# Patient Record
Sex: Male | Born: 1950 | Race: White | Hispanic: No | Marital: Married | Smoking: Former smoker
Health system: Southern US, Community
[De-identification: ages and names within clinical notes are randomized; demographics above are authoritative.]

## PROBLEM LIST (undated history)

## (undated) DIAGNOSIS — E785 Hyperlipidemia, unspecified: Secondary | ICD-10-CM

## (undated) DIAGNOSIS — I2510 Atherosclerotic heart disease of native coronary artery without angina pectoris: Secondary | ICD-10-CM

## (undated) DIAGNOSIS — L80 Vitiligo: Secondary | ICD-10-CM

## (undated) DIAGNOSIS — K219 Gastro-esophageal reflux disease without esophagitis: Secondary | ICD-10-CM

## (undated) DIAGNOSIS — C801 Malignant (primary) neoplasm, unspecified: Secondary | ICD-10-CM

## (undated) DIAGNOSIS — G4733 Obstructive sleep apnea (adult) (pediatric): Secondary | ICD-10-CM

## (undated) DIAGNOSIS — R9439 Abnormal result of other cardiovascular function study: Secondary | ICD-10-CM

## (undated) DIAGNOSIS — N400 Enlarged prostate without lower urinary tract symptoms: Secondary | ICD-10-CM

## (undated) DIAGNOSIS — R079 Chest pain, unspecified: Secondary | ICD-10-CM

## (undated) DIAGNOSIS — R972 Elevated prostate specific antigen [PSA]: Secondary | ICD-10-CM

## (undated) DIAGNOSIS — R339 Retention of urine, unspecified: Secondary | ICD-10-CM

## (undated) DIAGNOSIS — I10 Essential (primary) hypertension: Secondary | ICD-10-CM

## (undated) DIAGNOSIS — K222 Esophageal obstruction: Secondary | ICD-10-CM

## (undated) DIAGNOSIS — E039 Hypothyroidism, unspecified: Secondary | ICD-10-CM

## (undated) DIAGNOSIS — K625 Hemorrhage of anus and rectum: Secondary | ICD-10-CM

## (undated) DIAGNOSIS — Z86010 Personal history of colonic polyps: Secondary | ICD-10-CM

## (undated) DIAGNOSIS — K922 Gastrointestinal hemorrhage, unspecified: Secondary | ICD-10-CM

## (undated) DIAGNOSIS — K209 Esophagitis, unspecified: Secondary | ICD-10-CM

## (undated) DIAGNOSIS — I739 Peripheral vascular disease, unspecified: Secondary | ICD-10-CM

## (undated) HISTORY — DX: Vitiligo: L80

## (undated) HISTORY — DX: Esophageal obstruction: K22.2

## (undated) HISTORY — DX: Hyperlipidemia, unspecified: E78.5

## (undated) HISTORY — DX: Hypothyroidism, unspecified: E03.9

## (undated) HISTORY — DX: Esophagitis, unspecified: K20.9

## (undated) HISTORY — DX: Gastro-esophageal reflux disease without esophagitis: K21.9

## (undated) HISTORY — DX: Atherosclerotic heart disease of native coronary artery without angina pectoris: I25.10

## (undated) HISTORY — PX: OTHER SURGICAL HISTORY: SHX169

## (undated) HISTORY — PX: APPENDECTOMY: SHX54

## (undated) HISTORY — PX: CARDIAC CATHETERIZATION: SHX172

## (undated) HISTORY — DX: Gastrointestinal hemorrhage, unspecified: K92.2

## (undated) HISTORY — DX: Hemorrhage of anus and rectum: K62.5

## (undated) HISTORY — DX: Obstructive sleep apnea (adult) (pediatric): G47.33

## (undated) HISTORY — PX: COLONOSCOPY: SHX174

## (undated) HISTORY — DX: Essential (primary) hypertension: I10

## (undated) HISTORY — PX: PROSTATE BIOPSY: SHX241

## (undated) HISTORY — DX: Personal history of colonic polyps: Z86.010

## (undated) HISTORY — DX: Peripheral vascular disease, unspecified: I73.9

---
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
## 2001-02-25 ENCOUNTER — Other Ambulatory Visit: Admission: RE | Admit: 2001-02-25 | Discharge: 2001-02-25 | Payer: Self-pay | Admitting: General Surgery

## 2001-06-24 ENCOUNTER — Other Ambulatory Visit: Admission: RE | Admit: 2001-06-24 | Discharge: 2001-06-24 | Payer: Self-pay | Admitting: General Surgery

## 2002-05-19 ENCOUNTER — Encounter: Payer: Self-pay | Admitting: Family Medicine

## 2002-05-19 ENCOUNTER — Ambulatory Visit (HOSPITAL_COMMUNITY): Admission: RE | Admit: 2002-05-19 | Discharge: 2002-05-19 | Payer: Self-pay | Admitting: Family Medicine

## 2003-11-28 ENCOUNTER — Other Ambulatory Visit: Admission: RE | Admit: 2003-11-28 | Discharge: 2003-11-28 | Payer: Self-pay | Admitting: Dermatology

## 2004-11-17 HISTORY — PX: CORONARY ARTERY BYPASS GRAFT: SHX141

## 2005-06-24 ENCOUNTER — Ambulatory Visit: Payer: Self-pay | Admitting: *Deleted

## 2005-06-24 ENCOUNTER — Inpatient Hospital Stay (HOSPITAL_COMMUNITY): Admission: EM | Admit: 2005-06-24 | Discharge: 2005-06-27 | Payer: Self-pay | Admitting: Emergency Medicine

## 2005-06-25 ENCOUNTER — Ambulatory Visit: Payer: Self-pay | Admitting: Cardiology

## 2005-06-29 ENCOUNTER — Ambulatory Visit: Payer: Self-pay | Admitting: Hematology & Oncology

## 2005-07-08 ENCOUNTER — Inpatient Hospital Stay (HOSPITAL_COMMUNITY): Admission: RE | Admit: 2005-07-08 | Discharge: 2005-07-10 | Payer: Self-pay | Admitting: Cardiothoracic Surgery

## 2005-07-24 ENCOUNTER — Ambulatory Visit (HOSPITAL_COMMUNITY): Admission: RE | Admit: 2005-07-24 | Discharge: 2005-07-24 | Payer: Self-pay | Admitting: *Deleted

## 2005-07-24 ENCOUNTER — Ambulatory Visit: Payer: Self-pay | Admitting: *Deleted

## 2005-09-23 ENCOUNTER — Ambulatory Visit: Payer: Self-pay | Admitting: *Deleted

## 2005-12-03 ENCOUNTER — Ambulatory Visit: Payer: Self-pay | Admitting: *Deleted

## 2005-12-03 IMAGING — CR DG CHEST 2V
2 series · 2 of 2 positions shown · non-contrast
Comparison: 07/10/05.

CLINICAL DATA: Post-op from coronary artery bypass grafting. 
 CHEST - 2 VIEW:

[view not recorded (1 of 2)]
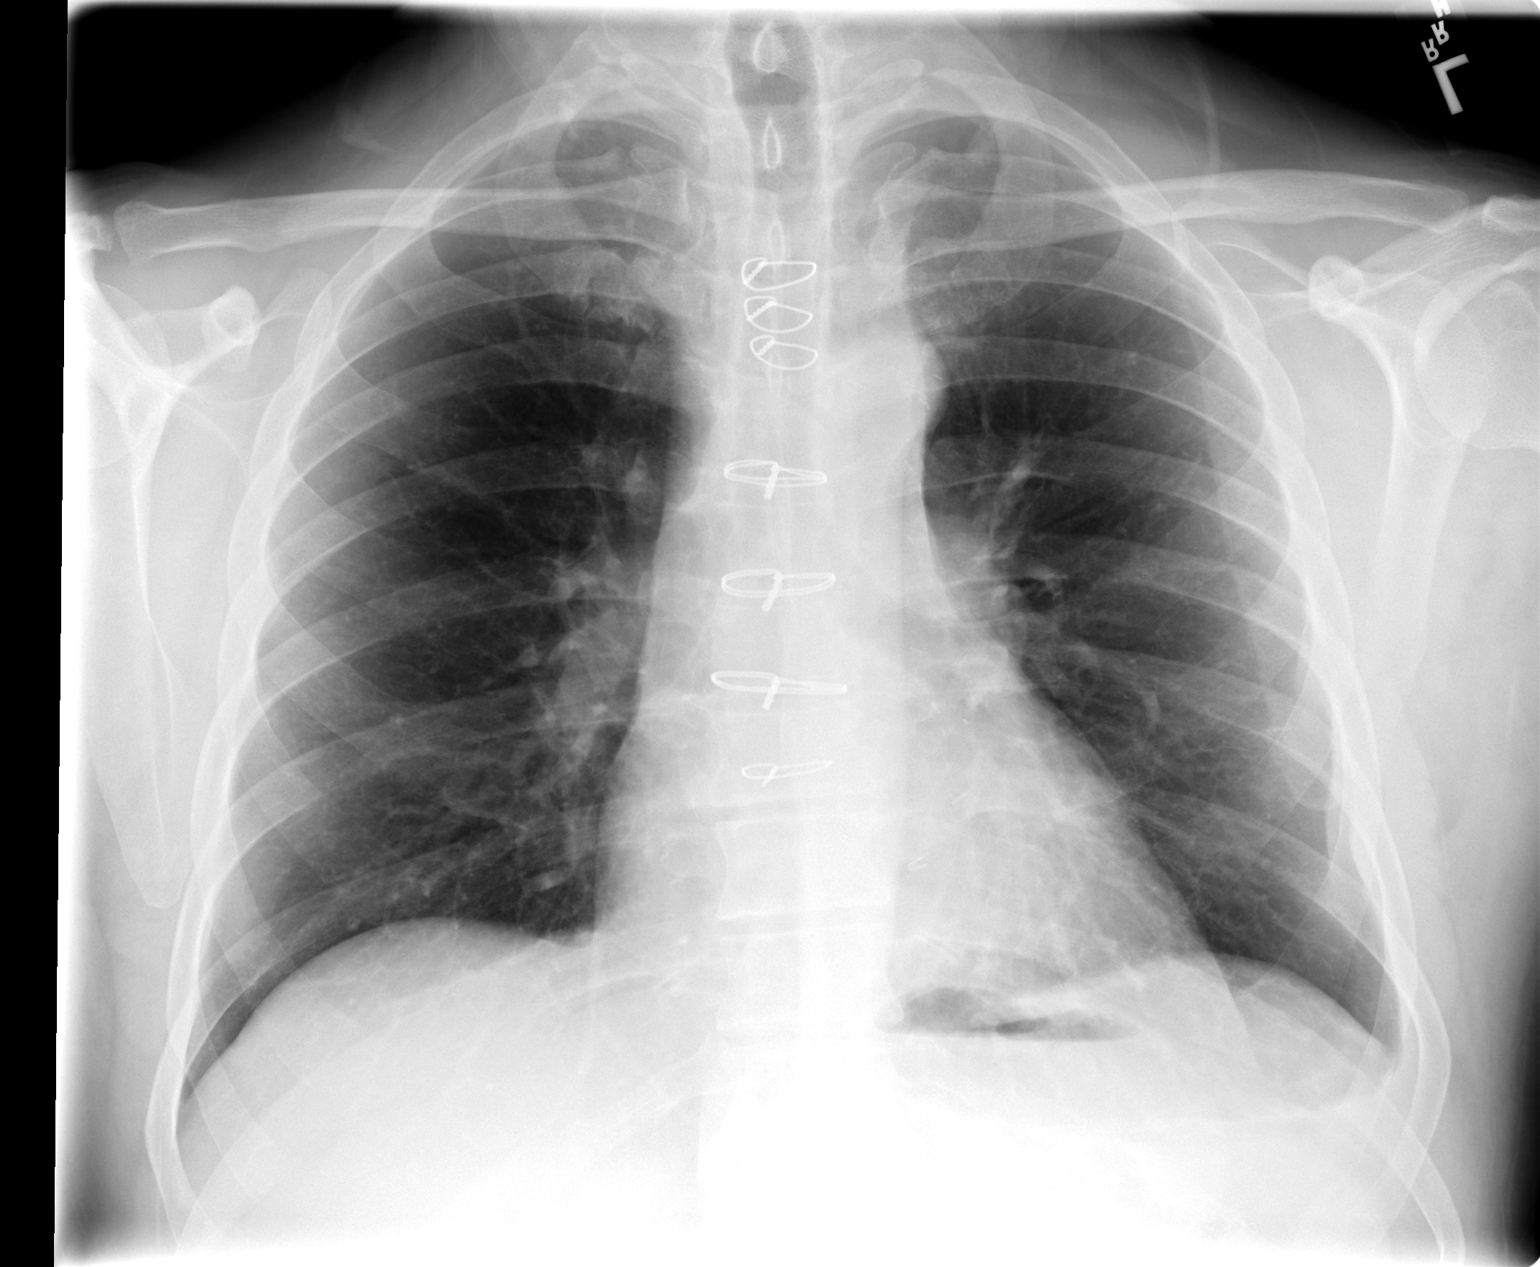

[view not recorded (2 of 2)]
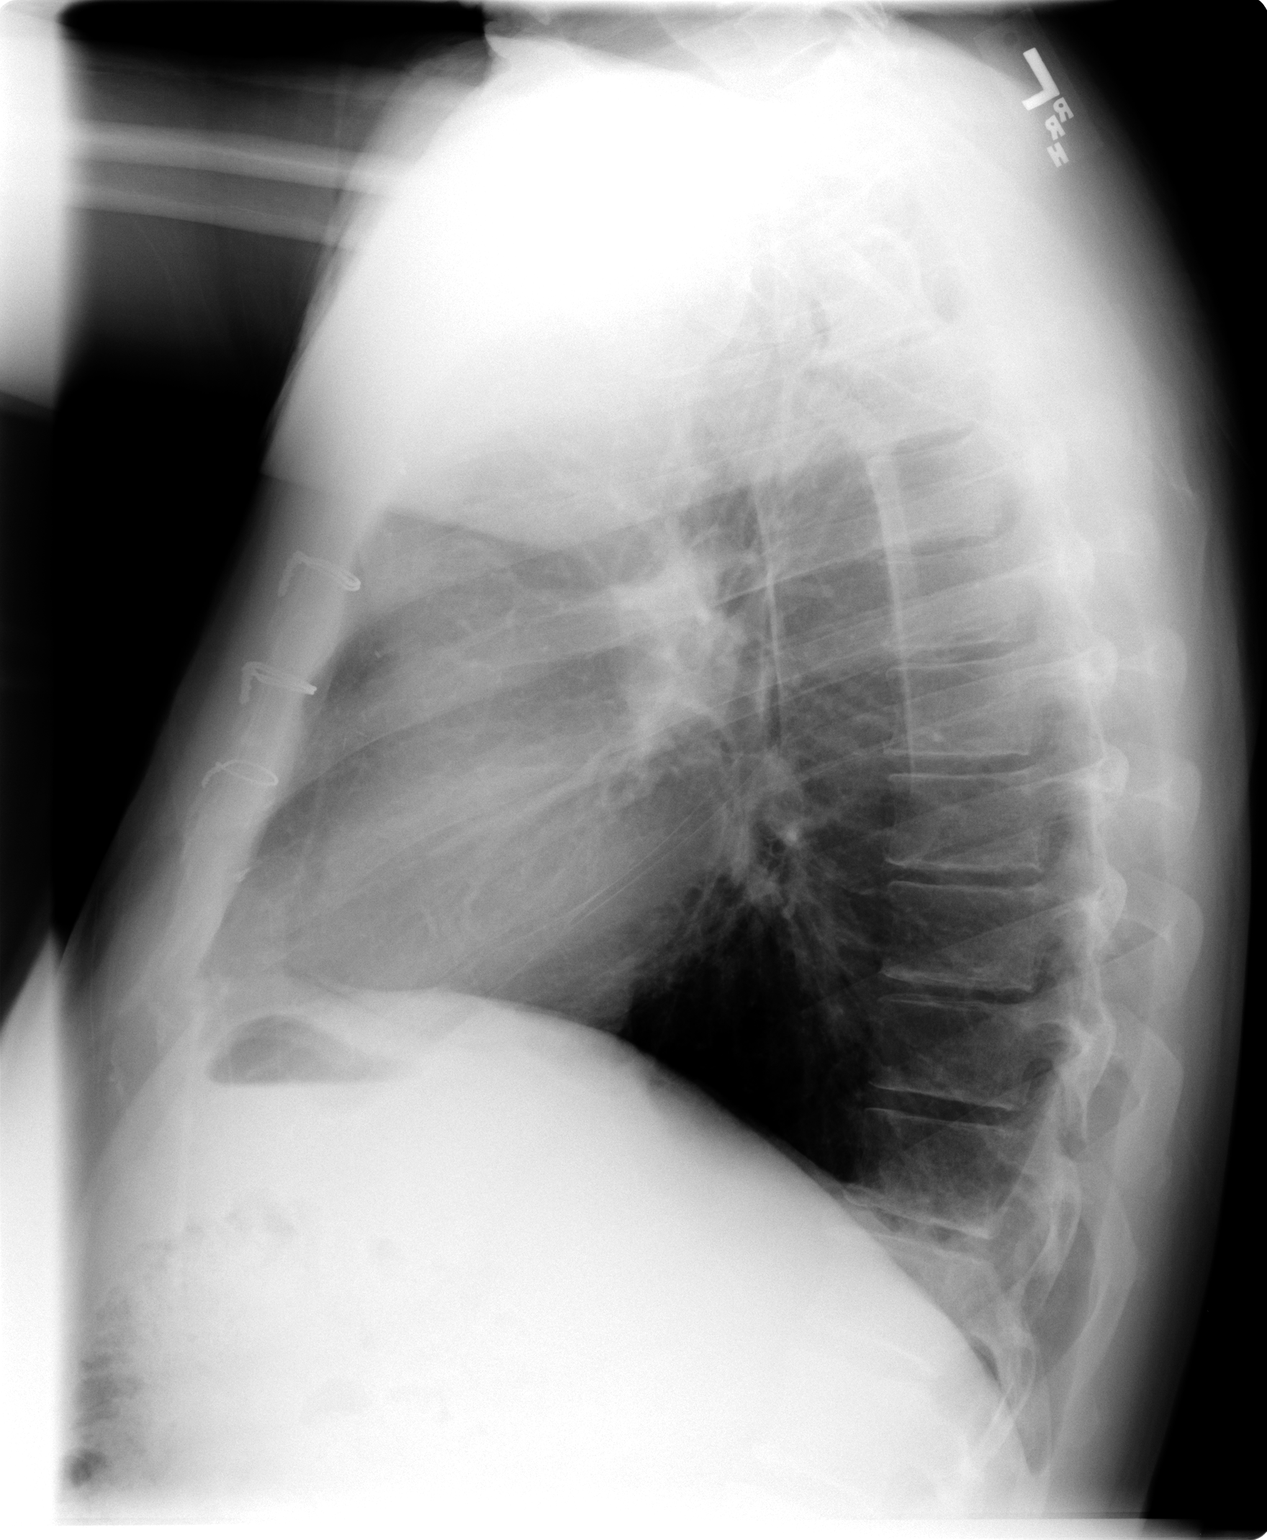

[2 of 2 positions shown; findings below may reference images not displayed]

Pleural effusions and bibasilar atelectasis have nearly completely resolved since the prior study. Minimal residual left pleural effusion vs pleural thickening is noted. Both lungs are clear.  There is no evidence of pneumothorax.   No mass or adenopathy is identified.   Heart size is within normal limits.   Median sternotomy wires are seen.
IMPRESSION: Minimal residual left pleural thickening vs fluid.  Otherwise negative.

## 2005-12-08 ENCOUNTER — Ambulatory Visit: Payer: Self-pay

## 2005-12-17 ENCOUNTER — Ambulatory Visit: Payer: Self-pay | Admitting: Cardiology

## 2005-12-17 ENCOUNTER — Ambulatory Visit: Payer: Self-pay | Admitting: *Deleted

## 2005-12-19 ENCOUNTER — Ambulatory Visit: Payer: Self-pay | Admitting: Cardiology

## 2005-12-19 ENCOUNTER — Inpatient Hospital Stay (HOSPITAL_BASED_OUTPATIENT_CLINIC_OR_DEPARTMENT_OTHER): Admission: RE | Admit: 2005-12-19 | Discharge: 2005-12-19 | Payer: Self-pay | Admitting: Cardiology

## 2005-12-30 ENCOUNTER — Ambulatory Visit: Payer: Self-pay | Admitting: *Deleted

## 2006-02-18 ENCOUNTER — Ambulatory Visit: Payer: Self-pay | Admitting: Internal Medicine

## 2006-02-19 ENCOUNTER — Encounter (INDEPENDENT_AMBULATORY_CARE_PROVIDER_SITE_OTHER): Payer: Self-pay | Admitting: *Deleted

## 2006-02-19 ENCOUNTER — Ambulatory Visit: Payer: Self-pay | Admitting: Internal Medicine

## 2006-06-18 ENCOUNTER — Ambulatory Visit: Payer: Self-pay | Admitting: *Deleted

## 2006-12-28 ENCOUNTER — Ambulatory Visit: Payer: Self-pay | Admitting: *Deleted

## 2007-08-12 ENCOUNTER — Ambulatory Visit: Payer: Self-pay | Admitting: Cardiology

## 2008-03-21 ENCOUNTER — Encounter: Payer: Self-pay | Admitting: Internal Medicine

## 2008-04-12 ENCOUNTER — Ambulatory Visit: Payer: Self-pay | Admitting: Cardiology

## 2008-04-17 ENCOUNTER — Ambulatory Visit: Payer: Self-pay

## 2008-12-18 ENCOUNTER — Ambulatory Visit: Payer: Self-pay | Admitting: Internal Medicine

## 2008-12-18 DIAGNOSIS — E785 Hyperlipidemia, unspecified: Secondary | ICD-10-CM

## 2008-12-18 DIAGNOSIS — E039 Hypothyroidism, unspecified: Secondary | ICD-10-CM | POA: Insufficient documentation

## 2008-12-18 DIAGNOSIS — K209 Esophagitis, unspecified without bleeding: Secondary | ICD-10-CM

## 2008-12-18 DIAGNOSIS — K625 Hemorrhage of anus and rectum: Secondary | ICD-10-CM

## 2008-12-18 DIAGNOSIS — K222 Esophageal obstruction: Secondary | ICD-10-CM

## 2008-12-18 HISTORY — DX: Esophagitis, unspecified without bleeding: K20.90

## 2008-12-18 HISTORY — DX: Esophageal obstruction: K22.2

## 2008-12-18 HISTORY — DX: Hemorrhage of anus and rectum: K62.5

## 2009-01-17 ENCOUNTER — Telehealth: Payer: Self-pay | Admitting: Internal Medicine

## 2009-01-17 ENCOUNTER — Ambulatory Visit: Payer: Self-pay | Admitting: Internal Medicine

## 2009-01-17 ENCOUNTER — Ambulatory Visit (HOSPITAL_COMMUNITY): Admission: RE | Admit: 2009-01-17 | Discharge: 2009-01-17 | Payer: Self-pay | Admitting: Internal Medicine

## 2009-01-17 ENCOUNTER — Encounter: Payer: Self-pay | Admitting: Internal Medicine

## 2009-01-18 ENCOUNTER — Encounter: Payer: Self-pay | Admitting: Internal Medicine

## 2009-01-19 ENCOUNTER — Ambulatory Visit: Payer: Self-pay | Admitting: Cardiology

## 2009-01-19 ENCOUNTER — Encounter: Payer: Self-pay | Admitting: Physician Assistant

## 2009-02-28 ENCOUNTER — Encounter (INDEPENDENT_AMBULATORY_CARE_PROVIDER_SITE_OTHER): Payer: Self-pay | Admitting: *Deleted

## 2009-02-28 LAB — CONVERTED CEMR LAB
ALT: 28 units/L
Albumin: 4.2 g/dL
BUN: 20 mg/dL
CO2: 24 meq/L
Calcium: 9.4 mg/dL
Glucose, Bld: 102 mg/dL
LDL Cholesterol: 91 mg/dL
Potassium: 4 meq/L
Sodium: 143 meq/L
Triglycerides: 113 mg/dL

## 2009-08-24 ENCOUNTER — Telehealth (INDEPENDENT_AMBULATORY_CARE_PROVIDER_SITE_OTHER): Payer: Self-pay | Admitting: *Deleted

## 2009-08-28 ENCOUNTER — Encounter (INDEPENDENT_AMBULATORY_CARE_PROVIDER_SITE_OTHER): Payer: Self-pay | Admitting: *Deleted

## 2009-11-13 ENCOUNTER — Telehealth (INDEPENDENT_AMBULATORY_CARE_PROVIDER_SITE_OTHER): Payer: Self-pay | Admitting: *Deleted

## 2009-11-20 ENCOUNTER — Encounter (INDEPENDENT_AMBULATORY_CARE_PROVIDER_SITE_OTHER): Payer: Self-pay | Admitting: *Deleted

## 2009-11-20 ENCOUNTER — Ambulatory Visit: Payer: Self-pay | Admitting: Cardiology

## 2009-11-20 DIAGNOSIS — I2510 Atherosclerotic heart disease of native coronary artery without angina pectoris: Secondary | ICD-10-CM | POA: Insufficient documentation

## 2009-11-29 ENCOUNTER — Telehealth (INDEPENDENT_AMBULATORY_CARE_PROVIDER_SITE_OTHER): Payer: Self-pay | Admitting: *Deleted

## 2010-01-08 ENCOUNTER — Telehealth (INDEPENDENT_AMBULATORY_CARE_PROVIDER_SITE_OTHER): Payer: Self-pay | Admitting: *Deleted

## 2010-10-24 ENCOUNTER — Encounter (INDEPENDENT_AMBULATORY_CARE_PROVIDER_SITE_OTHER): Payer: Self-pay | Admitting: *Deleted

## 2010-12-19 NOTE — Assessment & Plan Note (Signed)
Summary: rov to have rx filled/sn  Medications Added NAPROXEN 250 MG TABS (NAPROXEN) take 1 tab daily      Allergies Added: NKDA  Visit Type:  Follow-up Primary Provider:  Dr. Lilyan Punt   History of Present Illness: 60 year old male presents for a followup visit. He missed some of his visits following his last evaluation in March of last year. He reports no significant anginal chest pain. He does indicate that he had right shoulder surgery back in October of 2010. He is still doing rehabilitation related to this. He reports compliance with his medications.  Labs from April 2010 revealed an ALT of 28, AST 23, potassium 4.0, LDL 91, HDL 45, cholesterol 159, triglycerides 113, TSH 3.1.  He is due for a complete physical with Dr. Gerda Diss in the near future and will have followup labs done at that time.  He has been doing some walking for exercise.  Current Medications (verified): 1)  Omeprazole 20 Mg Cpdr (Omeprazole) .Marland Kitchen.. 1 Capsule By Mouth Once A Day 2)  Metoprolol Tartrate 25 Mg Tabs (Metoprolol Tartrate) .... Take One Half Tablet By Mouth Two Times A Day 3)  Levoxyl 100 Mcg Tabs (Levothyroxine Sodium) .Marland Kitchen.. 1 By Mouth Once Daily 4)  Tricor 145 Mg Tabs (Fenofibrate) .Marland Kitchen.. 1 By Mouth Once Daily 5)  Pravastatin Sodium 80 Mg Tabs (Pravastatin Sodium) .Marland Kitchen.. 1 By Mouth Once Daily 6)  Aspirin 325 Mg  Tabs (Aspirin) .... 1/2 By Mouth Once Daily 7)  Naproxen 250 Mg Tabs (Naproxen) .... Take 1 Tab Daily  Allergies (verified): No Known Drug Allergies  Past History:  Past Medical History: Last updated: 11/20/2009 CAD G E R D Esophageal stricture Gastrointestinal bleeding Hyperlipidemia Hypothyroidism  Past Surgical History: Last updated: 11/20/2009 Bilateral inguinal hernia repair Appendectomy CABG 2006 - LIMA to LAD, off-pump  Social History: Last updated: 12/18/2008 Occupation: Personnel officer Alcohol Use - yes Illicit Drug Use - no  Review of Systems  The patient denies  anorexia, fever, weight loss, chest pain, syncope, dyspnea on exertion, hemoptysis, melena, and hematochezia.         Otherwise reviewed and negative.  Vital Signs:  Patient profile:   60 year old male Weight:      199 pounds BMI:     27.86 Pulse rate:   62 / minute BP sitting:   144 / 87  (right arm)  Vitals Entered By: Dreama Saa, CNA (November 20, 2009 3:55 PM)  Physical Exam  Additional Exam:  Overweight male in no acute distress. HEENT: Conjunctivae and lids normal, oropharynx clear. Neck: Supple, no elevated jugular venous pressure, no carotid bruits. Lungs: Clear to auscultation, nonlabored. Cardiac: Regular rate and rhythm, no S3 gallop. Abdomen: Soft, nontender, bowel sounds present. Extremities: No pitting edema, distal pulses 2+. Skin: Warm and dry. Neuropsychiatric: Alert and oriented x3, affect appropriate.   Impression & Recommendations:  Problem # 1:  CORONARY ATHEROSCLEROSIS NATIVE CORONARY ARTERY (ICD-414.01)  No active angina on present medical regimen. I have asked Mr. Crehan to be regular with his exercise regimen and keep an eye on his blood pressure, which is mildly elevated today. He indicates that this is typically in the "120/70 " range. I plan for him to return over the next 6 months.  His updated medication list for this problem includes:    Metoprolol Tartrate 25 Mg Tabs (Metoprolol tartrate) .Marland Kitchen... Take one half tablet by mouth two times a day    Aspirin 325 Mg Tabs (Aspirin) .Marland Kitchen... 1/2 by mouth once  daily  Problem # 2:  HYPERLIPIDEMIA (ICD-272.4)  Patient due for followup labs with his next routine physical. LDL was 91 at last check. He has been tolerating his present medical regimen.  His updated medication list for this problem includes:    Tricor 145 Mg Tabs (Fenofibrate) .Marland Kitchen... 1 by mouth once daily    Pravastatin Sodium 80 Mg Tabs (Pravastatin sodium) .Marland Kitchen... 1 by mouth once daily  Patient Instructions: 1)  Your physician recommends that  you schedule a follow-up appointment in: 6 months 2)  Your physician recommends that you continue on your current medications as directed. Please refer to the Current Medication list given to you today.

## 2010-12-19 NOTE — Letter (Signed)
Summary: Appointment - Reminder 2  [facility] [place]. [address] [place] [address]   Phone: [phone]  Fax: [phone]     October 24, 2010 MRN: [id number]   [facility] [address] [place], [place]  [address]   Dear Mark [name],  Our records indicate that it is time to schedule a follow-up appointment.  Dr. Diona Browner         recommended that you follow up with [place] in    05/2010 PAST DUE        . It is very important that we reach you to schedule this appointment. We look forward to participating in your health care needs. Please contact us at the number listed above at your earliest convenience to schedule your appointment.  If you are unable to make an appointment at this time, give [place] a call so we can update our records.     Sincerely,   Glass blower/designer

## 2010-12-19 NOTE — Progress Notes (Signed)
   Recieved Request for Records from [facility] forwarded to [facility] for processing. Mark [name]  November 29, 2009 11:15 AM'    Appended Document:  Recieved request form [facility] forwarded to [facility] for processing

## 2010-12-19 NOTE — Miscellaneous (Signed)
Summary: labs bmp,lipids,liver,tsh 02/28/2009  Clinical Lists Changes  Observations: Added new observation of CALCIUM: 9.4 mg/dL (04/54/0981 1:91) Added new observation of ALBUMIN: 4.2 g/dL (47/82/9562 1:30) Added new observation of PROTEIN, TOT: 6.6 g/dL (86/57/8469 6:29) Added new observation of SGPT (ALT): 28 units/L (02/28/2009 8:39) Added new observation of SGOT (AST): 23 units/L (02/28/2009 8:39) Added new observation of ALK PHOS: 31 units/L (02/28/2009 8:39) Added new observation of BILI DIRECT: 0.1 mg/dL (52/84/1324 4:01) Added new observation of CREATININE: 1.03 mg/dL (02/72/5366 4:40) Added new observation of BUN: 20 mg/dL (34/74/2595 6:38) Added new observation of BG RANDOM: 102 mg/dL (75/64/3329 5:18) Added new observation of CO2 PLSM/SER: 24 meq/L (02/28/2009 8:39) Added new observation of CL SERUM: 108 meq/L (02/28/2009 8:39) Added new observation of K SERUM: 4.0 meq/L (02/28/2009 8:39) Added new observation of NA: 143 meq/L (02/28/2009 8:39) Added new observation of LDL: 91 mg/dL (84/16/6063 0:16) Added new observation of HDL: 45 mg/dL (11/25/3233 5:73) Added new observation of TRIGLYC TOT: 113 mg/dL (22/12/5425 0:62) Added new observation of CHOLESTEROL: 159 mg/dL (37/62/8315 1:76) Added new observation of TSH: 3.133 microintl units/mL (02/28/2009 8:39)

## 2010-12-19 NOTE — Progress Notes (Signed)
   Recieved request from [facility] forwarded to [facility] for processing. Mark [name]  January 08, 2010 9:06 AM

## 2011-04-01 NOTE — Assessment & Plan Note (Signed)
[facility]                            CARDIOLOGY OFFICE NOTE   Mark [name], Mark [name]                    MRN:          [id number]  DATE:04/12/2008                            DOB:          12-04-1950    PRIMARY CARE PHYSICIAN:  Scott A. Gerda Diss, MD.   REASON FOR VISIT:  Cardiology follow-up.   HISTORY OF PRESENT ILLNESS:  Mark [name] returns for routine visit.  He  is overall doing well without any significant angina on a typical basis,  although he did have some chest pain in the evening a few weeks ago.  His electrocardiogram shows sinus rhythm with borderline low voltage,  nonspecific T-wave changes.  This is similar to his previous tracing in  September.  His medications are modified now including Tricor and  pravastatin in lieu of previously gemfibrozil and Lipitor.  He is not  reporting any claudication or dyspnea beyond NYHA Class II.   ALLERGIES:  No known drug allergies.   PRESENT MEDICATIONS:  1. Aspirin 325 mg one half tablet p.o. daily.  2. Levoxyl 100 mcg p.o. daily.  3. Omeprazole 20 mg p.o. daily.  4. Tricor 145 mg p.o. daily.  5. Pravastatin 8 mg p.o. daily.  6. Flomax 0.4 mg p.o. nightly.  7. Metoprolol 25 mg one-quarter tablet b.i.d.   REVIEW OF SYSTEMS:  As described in history of present illness.  Otherwise negative.   PHYSICAL EXAMINATION:  VITAL SIGNS:  Blood pressure 121/73, heart rate  is 64, weight 192 pounds which is stable.  GENERAL APPEARANCE:  The patient is comfortable and in no acute  distress.  HEENT:  Conjunctiva and lids normal.  Oropharynx clear.  NECK:  Supple.  No elevated jugular venous pressure, no loud bruits. No  thyromegaly is noted.  LUNGS:  Clear without labored breathing at rest.  CARDIAC:  Regular rate and rhythm.  No loud murmur or gallop.  ABDOMEN:  Soft, nontender.  Normal active bowel sounds.  EXTREMITIES:  No pitting edema.  Distal pulses are 2+.  SKIN:  Warm and dry.  MUSCULOSKELETAL:   No kyphosis noted.  NEUROPSYCHIATRIC:  Patient alert and oriented x3.  Affect is normal.   LABORATORY DATA:  The laboratory data from March showed an LDL of 92,  HDL 37.  AST and ALT normal.   IMPRESSION/RECOMMENDATIONS:  1. Cardiovascular disease with normal ejection fraction status post      coronary bypass grafting with a left internal mammary artery to      left anterior descending.  Last ischemic evaluation was in 2007.      Our plan will be a follow-up exercise Myoview and medical therapy.      He had previously documented abnormal electrocardiographic      response, although no large areas of ischemia by perfusion data.      Otherwise, we will continue medical therapy and I will plan to see      him back in six months in our [place], [place], office.  2. Hyperlipidemia, now on Tricor and pravastatin.  We would aim for a      goal  LDL around 70.     Jonelle Sidle, MD  Electronically Signed    SGM/MedQ  DD: 04/12/2008  DT: 04/12/2008  Job #: [id number]   cc:   Lorin Picket A. Gerda Diss, MD

## 2011-04-01 NOTE — Assessment & Plan Note (Signed)
[name]                       [name] CARDIOLOGY OFFICE NOTE   MACEO, [name]                    MRN:          [id number]  DATE:01/19/2009                            DOB:          [birth year]    CARDIOLOGIST:  Mark Sidle, MD.   PRIMARY CARE PHYSICIAN:  Dr. Jonna [name]. Mark Diss, MD   REASON FOR VISIT:  Chest pain.   HISTORY OF PRESENT ILLNESS:  [name] is a 60 year old male patient  with a history of coronary artery disease, status post single-vessel  CABG in 2006, who underwent cardiac catheterization in February 2007  that demonstrated a patent LIMA to the LAD, no disease in the RCA or  circumflex and a 70% stenosis in the LAD proximal to the LIMA insertion.  The LAD was totally occluded at its origin.  The patient was last seen  by Dr. Diona [name] in May 2009.  He was set up for a stress Myoview study  that was done in [place].  I just received those results over the fax  machine and the test was done April 17, 2008.  He had had a prior false  positive stress test in 2007 that prompted his previous cardiac  catheterization.  The stress test from June 2009 demonstrated no change  from 2007.  There was possible slight ischemia at the base of the  anteroseptal wall.  His EF was 62%.  The patient recently had some  rectal bleeding and was set up for a colonoscopy.  He also has a history  of acid reflux disease and status post esophageal dilatation secondary  to stricture.  He underwent colonoscopy with Dr. Lina [name] this past  week.  The evening of his colonoscopy, he developed some left upper  quadrant and lower chest pain.  This seemed to be worse with lying down  and better with sitting up.  He denied any shortness of breath, nausea,  diaphoresis, syncope or near syncope.  He had some brief discomfort in  his neck.  He took some baking soda and his symptoms resolved.  He does  recall a similar episode of left upper quadrant  pain occurring several  weeks ago.  He discussed this with Dr. Juanda [name] and she suggested that he  may have cholelithiasis.  He denies any exertional chest heaviness or  tightness or exertional shortness of breath.  His prior anginal symptom  was that of neck pain.  He denies any exertional neck pain.  He does not  feel that his neck discomfort the other night was similar to his  previous angina.   CURRENT MEDICATIONS:  Metoprolol 25 mg quarter tablet daily, Levoxyl 0.1  mg daily, Pravastatin 80 mg daily, Omeprazole 20 mg daily, TriCor 145 mg  nightly, Aspirin 325 mg half tablet daily,  Hydrocodone p.r.n.,  Alprazolam p.r.n., Ibuprofen p.r.n.   ALLERGIES:  POISON IVY.   SOCIAL HISTORY:  Denies tobacco abuse.   REVIEW OF SYSTEMS:  Please see HPI.  Denies fevers, chills, cough,  melena, hematochezia, hematuria, dysuria.  Rest of the review of systems  are negative.   PHYSICAL EXAMINATION:  GENERAL:  He is a well-nourished, well-developed  male.  VITAL SIGNS:  Blood pressure is 120/80, pulse 73, weight 197 pounds.  HEENT:  Normal.  NECK:  Without JVD.  CARDIAC:  Normal S1 and S2.  Regular rate and rhythm.  No murmur.  LUNGS:  Clear to auscultation bilaterally without wheezing, rhonchi, or  rales.  ABDOMEN:  Soft, nontender with normoactive bowel sounds.  No  organomegaly.  EXTREMITIES:  Without clubbing, cyanosis, or edema.  MUSCULOSKELETAL:  Without joint deformity.  NEUROLOGIC:  He is alert and oriented x3.  Cranial nerves II through XII  are grossly intact.  VASCULAR:  Without carotid bruits bilaterally.   Electrocardiogram reveals sinus rhythm with a heart rate of 73, normal  axis, nonspecific ST-T wave changes.   ASSESSMENT AND PLAN:  1. Chest and neck pain in a 60 year old male with a history of      coronary artery disease status post single-vessel coronary artery      bypass grafting in 2006 with a left internal mammary artery to the      left anterior descending  artery, patent bypass graft by cardiac      catheterization in February 2007, and no obstructive disease in the      right coronary artery and circumflex and prior false positive      Cardiolite study with similar findings on recent functional testing      in June 2009 with overall preserved left ventricular function.  His      symptoms are very atypical.  Some of his symptoms the other night      seemed to be related to his procedure earlier in the day.  He had      some brief neck discomfort.  However, he has no exertional symptoms      or symptoms reminiscent of his previous angina.  I had a long      discussion with him about this today and I have recommended no      further testing at this time.  He should follow up with Dr. Gerda [name]      to see if he may need further testing to rule in/rule out      cholelithiasis.  I will bring him back in followup in the next      several weeks with Dr. Diona [name] to review his symptoms.  If he      should have a change in his symptoms, we may need to consider      further testing at that time.  2. Dyslipidemia.  This is followed by Dr. Gerda [name].  He continues on      pravastatin.   DISPOSITION:  Follow up with Dr. Diona [name] in 1 month or sooner p.r.n.      Tereso Newcomer, PA-C  Electronically Signed      Mark [name]. Mark Pates, MD, [name]  Electronically Signed   SW/MedQ  DD: 01/19/2009  DT: 01/20/2009  Job #: [id number]   cc:   Mark Picket A. Mark Diss, MD

## 2011-04-01 NOTE — Assessment & Plan Note (Signed)
[facility]                            CARDIOLOGY OFFICE NOTE   Mark [name], Mark [name]                    MRN:          [id number]  DATE:08/12/2007                            DOB:          December 13, 1950    PRIMARY CARE PHYSICIAN:  Mark [name], M.D.   REASON FOR VISIT:  Cardiac followup.   HISTORY OF PRESENT ILLNESS:  [name] is a pleasant gentleman,  previously followed by Dr. Glennon [name].  He has a history of small  vessel coronary artery disease, status post bypass grafting with a LIMA  to the left anterior descending in 2006.  His last ischemic evaluation  was with a cardiac catheterization in February, 2007 following a mildly  abnormal Myoview study.  This revealed a patent LIMA to the left  anterior descending with normal left ventricular function.  His  circumflex and right coronary artery are relatively  normal.  Ejection  fraction was 60% at that time.  Symptomatically, [name] states  that he has done fairly well.  He does have some dyspnea on exertion and  NYHA Class II and occasionally chest pain that is fairly brief.  His  electrocardiogram today is normal, showing sinus rhythm at 65 beats per  minute.  No marks, changes are noted in comparison to the previous  tracing.  Lipids are followed by Dr. Gerda [name].   ALLERGIES:  No known drug allergies.   PRESENT MEDICATIONS:  1. Aspirin 325 mg 1/2 tablet p.o. daily.  2. Lipitor 40 mg p.o. daily.  3. Levoxyl 100 mcg p.o. daily.  4. Gemfibrozil 600 mg p.o. b.i.d.  5. Toprol XL 12.5 mg p.o. daily.  6. Temazepam 30 mg p.o. nightly p.r.n.   REVIEW OF SYSTEMS:  As described in the history of present illness.   PHYSICAL EXAMINATION:  Blood pressure 132/86, heart rate 72, weight is  194 pounds, up from 189.  Patient is in no acute distress.  HEENT:  Conjunctivae is normal.  Oropharynx is clear.  NECK:  Supple without elevated jugular venous distention or loud bruits.  LUNGS:   Generally clear without labored breathing.  CARDIAC:  Regular rate and rhythm.  No loud murmur or gallop.  EXTREMITIES:  No significant pitting edema.   IMPRESSION/RECOMMENDATIONS:  1. Small vessel coronary artery disease with preserved ejection      fraction, status post bypass grafting with a left internal mammary      artery to the left anterior descending, found to be patent at      cardiac catheterization in February, 2007.  Overall, [name]      has been symptomatically stable, and we will continue observation      at this time.  I would like to see him back in six months, and we      may think about a repeat Myoview at that time.  Otherwise, if he      has any new or progressive symptoms, he will let us know earlier.  2. Hyperlipidemia:  Followed by Dr. Gerda [name].  Goal LDL should be around      70.  He  seems to be on a reasonable regimen.     Jonelle Sidle, MD  Electronically Signed    SGM/MedQ  DD: 08/12/2007  DT: 08/13/2007  Job #: [id number]   cc:   Lorin Picket A. Mark Diss, MD

## 2011-04-04 NOTE — Letter (Signed)
June 18, 2006     Scott A. Gerda Diss, M.D.  [address]  [place], [place] [address].   RE:  SELSO, MANNOR  MRN:  [id number]  /  DOB:  Mar 20, 1951   Dear Lorin Picket:   It was a pleasure to see this nice patient, Mark [name], for followup on  June 18, 2006, nearly a year postoperatively CABG Dr. Ofilia Neas.  He has  had the left internal mammary artery implanted in the left anterior  descending coronary artery.   The patient does not feel as well as he would like.  He has occasional chest  pain and occasional neck pain.  These are not related to exertion.  He is  able to walk two or three miles a day without discomfort.  As you know, he  had a followup catheterization and stress test.  Left ventricle is normal.   MEDICATIONS:  1.  Aspirin 162.  2.  Synthroid.  3.  Lipitor 40.  4.  Levoxyl.  5.  Metoprolol 12.5 daily.  6.  Omeprazole.  7.  Gemfibrozil.   The patient states his blood work was done recently in your office.   PHYSICAL EXAMINATION:  VITAL SIGNS: Blood pressure 123/85, pulse 57, sinus  bradycardia.  GENERAL APPEARANCE:  Normal.  NECK:  JVP is not elevated. Carotid pulses bilaterally equal without bruits.  LUNGS:  Clear.  CARDIAC:  Normal.  EXTREMITIES:  Normal.  EKG:  Normal.   IMPRESSION:  Diagnosed as above.  I think his chest pain is atypical,  probably not related to his heart disease.  I suggest he continue on the  same therapy.  I will be happy to see him again in four months or any other  time you so desire.  I would appreciate a copy of his recent lab work for my  records.   Thank you for the opportunity to share in this nice gentleman's care.    Sincerely,      E. Graceann Congress, MD, [facility]   EJL/MedQ  DD:  06/18/2006  DT:  06/18/2006  Job #:  [id number]

## 2011-04-04 NOTE — Consult Note (Signed)
Mark [name], Mark [name] NO.:  [id number]   MEDICAL RECORD NO.:  [id number]          PATIENT TYPE:  INP   LOCATION:  NA                           FACILITY:  [facility]   PHYSICIAN:  Sheliah Plane, MD    DATE OF BIRTH:  [birth year]   DATE OF CONSULTATION:  DATE OF DISCHARGE:                                   CONSULTATION   REQUESTING PHYSICIAN:  Dr. Shana Chute. Riley Kill.   FOLLOW UP CARDIOLOGIST:  Dr. Corinda Gubler.   PRIMARY CARE PHYSICIAN:  Dr. Gerda Diss.   REASON FOR CONSULTATION:  Coronary occlusive disease.   HISTORY OF PRESENT ILLNESS:  The patient is a 60 year old male with a 3-4  month history of poorly defined pain in his neck associated with exertion,  especially when walking or swimming. He has noted several episodes of this  usually when walking uphill and has occurred when he was swimming. Usually  relieved with rest and lasts approximately 30 seconds when he stops exerting  himself. He denies any nocturnal or rest pain.   CARDIAC RISK FACTORS:  History of hyperlipidemia and strong family history  of coronary disease with his father dying at age 29, after being sent home  from the emergency room where he had chest pain. He has also had several  uncles on his father's side who died in their early 7s of myocardial  infarction. The patient denies diabetes, denies hypertension, denies  previous stroke, denies claudication, was a remote smoker but quit 26 years  ago. Denies renal insufficiency.   PAST MEDICAL HISTORY:  As noted above. In addition, history of  hyperthyroidism x7 years.   PAST SURGICAL HISTORY:  1.  Appendectomy.  2.  Bilateral inguinal hernia repair.   SOCIAL HISTORY:  The patient is married, has 2 children, works as an  Personnel officer.   REVIEW OF SYSTEMS:  The patient denies any constitutional symptoms. Denies  wheezing, denies hemoptysis, has a history of thyroid disorder x7 years.  Denies claudication. Denies difficulty in urination. Denies  blood in his  stool or urine.   CURRENT MEDICATIONS:  1.  Synthroid 0.1 mg.  2.  Toprol 25 mg a day.  3.  Gemfibrozil 600 mg twice a day.  4.  Aspirin 325 mg a day.  5.  Lipitor 40 mg a day.   PHYSICAL EXAMINATION:  GENERAL:  The patient is a healthy appearing male.  VITAL SIGNS:  Blood pressure 108/70, pulse 52 and regular, respiratory rate  18, O2 sat is 98%. The patient is awake, alert, neurologically intact, who  appears his stated age.  HEENT:  Pupils equal, round and reactive to light.  NECK:  Without carotid bruits or jugular venous distention.  LUNGS:  Clear bilaterally. He has no carotid bruits.  ABDOMEN:  Without palpable organomegaly. There is no palpable abdominal  aneurysm.   LABORATORY FINDINGS:  The patient is noted to have an elevated TSH level to  12.7, creatinine is 1.  The patient underwent cardiac catheterization on August 9, by Dr. Riley Kill,  which showed ejection fraction of 55% with a subtotal LAD occlusion and none  in the  circumflex. He was also seen by Dr. Myna Hidalgo after his cardiac  catheterization because of a brother who had a clotting disorder, though the  patient has had no specific history of clotting disorder.  I reviewed the films with the patient and his wife and agree with coronary  artery bypass grafting at least to the LAD and possibly diagonal. The risks  and options were discussed with the patient in detail including the risk of  death, infection, stroke, myocardial infarction, bleeding, blood  transfusion. The patient is willing to proceed, we have tentatively planned  for August 22. The patient's questions have been answered.      Sheliah Plane, MD  Electronically Signed     EG/MEDQ  D:  07/06/2005  T:  07/07/2005  Job:  [id number]   cc:   Arturo Morton. Riley Kill, M.D. [facility]  [address]  [address]  [place]  [place] [address]   Lorin Picket A. Gerda Diss, MD  [address]  [place]  [place] [address]  Fax: [phone]

## 2011-04-04 NOTE — Cardiovascular Report (Signed)
NAME:  Mark [name], Mark [name] NO.:  [id number]   MEDICAL RECORD NO.:  [id number]          PATIENT TYPE:  OIB   LOCATION:  1962                         FACILITY:  [facility]   PHYSICIAN:  Charlies Constable, M.D. 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  Approximately 7 months ago he had a single vessel bypass by Dr. Tyrone Sage  with a LIMA graft to the LAD. He developed some symptoms of neck and arm  pain and also had some mild shortness of breath with exertion. He had a  Cardiolite scan done in [place] which suggested anteroseptal and  inferoseptal ischemia. He was scheduled for evaluation angiography in the  outpatient laboratory.   PROCEDURE:  Left heart catheterization was performed percutaneously via the  right femoral artery using an arterial sheath and 4 French preformed  coronary catheters. Right heart catheterization was performed percutaneously  via the right femoral vein using a venous sheath and Swan-Ganz  thermodilution catheter. The LIMA graft was injected with a JR4 catheter.  Distal aortogram was performed to rule out abdominal aortic aneurysm. The  patient tolerated the procedure well and left the laboratory in satisfactory  condition.   RESULTS:  Hemodynamic data: The right atrial pressure was 9 mean. The right  ventricular pressure was 30/10. Pulmonary artery pressure was 30/12 with a  mean of 19. Coronary wedge pressure was 10 mean. Left ventricular pressure  was 110/12. The aortic pressure was 110/69 with a mean of 85.   The cardiac output/cardiac index was 4.0/2.0 L/minute/sq m.   ANGIOGRAPHIC DATA:  The left main coronary artery: The left main coronary  artery was free of significant disease.   Left anterior descending artery: The left anterior descending artery was  completely  occluded in its origin.   Circumflex artery: The circumflex artery gave rise to a ramus branch and AV  branch. AV branch terminated into a posterolateral branch. These vessels  were free of significant disease.   Right coronary artery: The right coronary artery was a dominant vessel that  gave rise to a conus branch, a right ventricular branch, a posterior  descending branch, and 3 posterolateral branches. These vessels were free of  significant disease.   The LIMA graft of the LAD was patent and functioned normally. There were 10  and 70% stenoses just proximal to the insertion site and there were 2 sets  of perforators and a diagonal branch proximal to these lesions which filled  through the graft. There is no significant disease in the distal LAD after  the graft insertion site.   Left ventriculogram: The left ventriculogram performed in RAO projection  showed good wall motion with no areas of hypokinesis. The estimated ejection  fraction was 60%.   A distal aortogram was performed which showed patent renal artery stenosis  and no significant aortoiliac obstruction.   CONCLUSION:  1.  Coronary artery disease status post prior coronary artery bypass graft  surgery.  2.  Significant native vessel disease with total occlusion of the left      anterior descending artery but no significant disease in the circumflex      and right coronary arteries.  3.  Patent LIMA graft to the left anterior descending with 70% stenosis      proximal to the insertion of the graft and no significant stenosis      distal to the graft insertion site.  4.  Normal left ventricular function.   RECOMMENDATIONS:  Reassurance. There is no source of ischemia. In view of  these findings I think the Myoview scan represents a false positive scan.           ______________________________  Charlies Constable, M.D. [facility]     BB/MEDQ  D:  12/19/2005  T:  12/19/2005  Job:  [id number]   cc:   Lorin Picket A. Gerda Diss, MD   Fax: [phone]   Vida Roller, M.D.  Fax: [phone]   E. Graceann Congress, M.D.  [address]  [address]  [place]  [place] [address]

## 2011-04-04 NOTE — Letter (Signed)
December 28, 2006    Scott A. Gerda Diss, MD  [address]  [place], [place] [address]   RE:  Mark, [name]  MRN:  [id number]  /  DOB:  1951/07/31   Dear Lorin Picket,   It was pleasure to see the nice patient, Mark [name], for followup  on December 28, 2006, a year and a half post coronary artery bypass  graft surgery x1 with left internal mammary artery to the LAD. The  patient is getting along quite well. As you know, he had a followup cath  by Dr. Juanda Chance December 19, 2005 revealing patent LIMA to LAD. This  aortogram revealed no aneurysm.   I should note the circ and RCA were normal.   The patient is getting along well except for occasional muscular type  neck and pain in his knees. He is on aspirin 162, Lipitor 40, Levoxyl  100, Omeprazole, metoprolol 12.5 daily, gemfibrozil 600 b.i.d.,  temazepam 30 h.s. p.r.n. and Zolpidem 10 h.s. p.r.n.   VITAL SIGNS:  Blood pressure 126/78, pulse 53, sinus bradycardia.  GENERAL:  Normal.  NECK:  JVP is not elevated. Carotid pulses palpable and even without  bruits.  LUNGS:  Clear.  CARDIAC:  Reveals no murmur, gallop or rub.  ABDOMEN:  Normal.  EXTREMITIES:  Normal.   EKG:  Sinus bradycardia otherwise normal.   IMPRESSION:  1. Coronary artery disease 18 months status post coronary artery      bypass graft with left internal mammary artery to left anterior      descending - asymptomatic.  2. Hyperlipidemia on therapy.  3. History of hypothyroidism.   I suggested the patient continue on the same therapy. He is to have a  yearly checkup by you in the near future. I suggested he followup in our  office in 6 months or p.r.n.   Thank you for the opportunity to see this nice gentleman.    Sincerely,      E. Graceann Congress, MD, [facility]  Electronically Signed    EJL/MedQ  DD: 12/28/2006  DT: 12/29/2006  Job #: [id number]

## 2011-04-04 NOTE — Cardiovascular Report (Signed)
NAMECHRISTIANO, BLANDON NO.:  [id number]   MEDICAL RECORD NO.:  [id number]          PATIENT TYPE:  INP   LOCATION:  3307                         FACILITY:  [facility]   PHYSICIAN:  Arturo Morton. Riley Kill, M.D. [facility] OF BIRTH:  Apr 14, 1951   DATE OF PROCEDURE:  06/25/2005  DATE OF DISCHARGE:                              CARDIAC CATHETERIZATION   INDICATIONS:  [name] is a 60 year old gentleman who has had recent  chest discomfort that is mostly exertional but intermittent. He was seen by  Dr. Corinda Gubler and referred in for cardiac catheterization.   PROCEDURES:  1.  Left heart catheterization.  2.  Selective coronary arteriography.  3.  Selective left ventriculography.  4.  Subclavian angiography.   DESCRIPTION OF PROCEDURE:  The patient was brought to the catheterization  laboratory, prepped and draped in the usual fashion. Through an anterior  puncture, the right femoral artery was easily entered. A 6-French sheath was  placed. Views of the left and right coronary arteries obtained in multiple  angiographic projections. Central aortic and left ventricular pressures were  measured with a pigtail. Subclavian injection was done because of the need  for probable revascularization surgery to identify the internal mammary  artery. There were no complications. He was taken to the holding area in  satisfactory clinical condition.   HEMODYNAMIC DATA:  1.  Central aortic pressure 128/90, mean 107.  2.  Left atrial pressure 120/18.  3.  No gradient on pullback across aortic valve.   ANGIOGRAPHIC DATA:  1.  The left main coronary tapers slightly at the ostium but is without      critical narrowing.  2.  The left anterior descending artery has a flush occlusion at the takeoff      of the left main. The distal vessel fills by collaterals from both the      right coronary artery. The diagonal appears to fill from the marginal      system. The LAD lights up pretty well from the  right coronary injection      and there is at least two midvessel lesions of 80% and 70%. There is      also an apical stenosis that is probably 70%.  3.  The circumflex provides a large ramus intermedius without critical      narrowing.  4.  The AV circumflex without critical disease.  5.  The right coronary is a large dominant vessel providing posterior      descending and posterolateral system and highlights the distal LAD as      noted above.  6.  The subclavian is widely patent with widely patent internal mammary.  7.  Ventriculography in the RAO projection reveals no definite wall motion      abnormalities. The ventriculogram estimated ejection fraction is in the      range of 55%.   DISPOSITION:  I spoke with Dr. Corinda Gubler. We will likely get a surgical  consult. The patient's brother has a history of major clotting disorder for  which he takes Lovenox 120 milligrams daily. With this, we will need to get  the hematology service to see the patient as they follow his brother.       TDS/MEDQ  D:  06/25/2005  T:  06/25/2005  Job:  [id number]   cc:   CV Laboratory   Patient's medical record   E. Graceann Congress, M.D.   Scott A. Gerda Diss, MD  [address]  [place]  [place] [address]  Fax: [phone]

## 2011-04-04 NOTE — Discharge Summary (Signed)
NAMEKIEFER, OPHEIM NO.:  [id number]   MEDICAL RECORD NO.:  [id number]          PATIENT TYPE:  INP   LOCATION:  2029                         FACILITY:  [facility]   PHYSICIAN:  Arturo Morton. Riley Kill, M.D. [facility] OF BIRTH:  1951/07/21   DATE OF ADMISSION:  06/24/2005  DATE OF DISCHARGE:  06/27/2005                           DISCHARGE SUMMARY - REFERRING   HISTORY OF PRESENT ILLNESS:  Mr. Brannock is a 60 year old white male who  describes a three to four-month history of discomfort in his throat while  walking and swimming.  He describes at last a dozen episodes, the last  occurring approximately three days ago while swimming.  It lasted for about  30 seconds and resolved with rest.  He has not had any rest or nocturnal  discomfort.   PAST MEDICAL HISTORY:  1.  He has a history of hyperlipidemia.  2.  A strong early family for coronary artery disease.  3.  Remote tobacco use.   LABORATORY DATA:  Chest x-ray on June 24, 2005, did not show any acute  disease.  A repeat chest x-ray on August 9th again did not show any acute  disease.   Admission H&H was 14.4 and 40.9 respectively, normal indices, platelets 261,  WBC's 4.9.  At the time of discharge on June 27, 2005, the H&H were 14.7  and 42.4 respectively, normal indices and platelets 249, WBC's 8.0.  Admission PTT was 27, PT 12.9.  Sodium 135, potassium 3.9, BUN 18,  creatinine 1.0.  Normal liver function tests.  Glucose 107.  Subsequent  chemistries were unremarkable.  CK, MB and troponin negative x2.  Fasting  lipids showed a total cholesterol of 198, triglycerides 213, HDL 42, LDL  113.  TSH level was high at 12.761.   Electrocardiogram shows sinus bradycardia, borderline left axis deviation,  early R, nonspecific ST-T wave changes.   HOSPITAL COURSE:  After being seen in the office, Dr. Cecil Cranker felt  that he should undergo a cardiac catheterization to further evaluate his  symptoms.  He was  started on aspirin as well as Plavix on August 8th, as  well as a Statin and beta blocker.  A cardiac catheterization was performed  on June 25, 2005, by Dr. Arturo Morton. 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  Dr. Riley Kill felt that a coronary artery bypass graft surgery may be in  order.  Dr. Riley Kill also noted that the patient has a brother with a major  clotting disorder, who is followed by Dr. Rose Phi. Ennever; thus ordered the  hematology evaluation on June 26, 2005.  Hematology noted that labs would  take seven to 10 days to come back.  They did mention that there was no  contraindication at this time for antiplatelet agents or IIbIIIa inhibitors.  Given the anticipation of surgery, the patient did not receive any further  Plavix after June 26, 2005.  On June 27, 2005, Dr. Riley Kill felt that the  patient could be discharged home, with outpatient followup with Dr. Sheliah Plane  for a possible bypass surgery.   DISCHARGE DIAGNOSES:  1.  Unstable angina.  2.  Coronary artery disease.  Please refer to the cardiac catheterization      dictation.  3.  Remote tobacco use.  4.  Hyperlipidemia with initiation of Statin therapy.  5.  History as previously.   DIET:  Mr. Zane was asked to maintain a low-salt, low-fat, low-  cholesterol diet.   DISCHARGE INSTRUCTIONS:  He received instructions per the post-  catheterization discharge instruction sheet.   DISCHARGE MEDICATIONS:  1.  Aspirin 325 mg daily.  2.  Lipitor 40 mg q.h.s.  3.  Toprol XL 25 mg daily.  4.  Synthroid 0.75 mg daily.  5.  Lopid 600 mg b.i.d.  6.  Nitroglycerin 0.4 mg p.r.n.   FOLLOWUP:  1.  He was asked to see Dr. Tyrone Sage at his office on July 03, 2005, at      1300.  Prior to leaving the hospital, he will have preoperative coronary      artery bypass graft surgery Dopplers performed, and these results have      been asked to be  faxed to Dr. Dennie Maizes office.  2.  He was also asked to arrange followup with Dr. Gerda Diss in approximately      one week, in regards to his elevated TSH.  3.  He will follow up with Dr. Tiney Rouge physician assistant in      the [facility] on July 04, 2005, at 1400, to review further      plans in regards to his coronary artery disease.  4.  He will need fasting lipids and liver function tests in approximately      six to eight weeks, since Lipitor was initiated.  5.  Dr. Myna Hidalgo stated that he will follow up on the patient's labs, in      regards to his homocysteine level, factor VIII, factor IX and      hypercoagulable panel, which are still pending at the time of this      dictation.       EW/MEDQ  D:  06/27/2005  T:  06/27/2005  Job:  [id number]   cc:   Cecil Cranker, M.D.   Sheliah Plane, MD  [address]  [place]  [place] [address]  Email: [email or web address]   Scott A. Gerda Diss, MD  [address]  [place]  [place] [address]  Fax: [phone]   Rose Phi. Myna Hidalgo, M.D.  [address] [facility]  [place], [place] [address]  Fax: [phone]   Arturo Morton. Riley Kill, M.D. [facility]  [address]  [address]  [place]  [place] [address]

## 2011-04-04 NOTE — Op Note (Signed)
NAMELAVELLE, AKEL NO.:  [id number]   MEDICAL RECORD NO.:  [id number]          PATIENT TYPE:  INP   LOCATION:  2009                         FACILITY:  [facility]   PHYSICIAN:  Sheliah Plane, MD    DATE OF BIRTH:  04-14-1951   DATE OF PROCEDURE:  07/08/2005  DATE OF DISCHARGE:                                 OPERATIVE REPORT   PREOPERATIVE DIAGNOSIS:  Coronary occlusive disease.   POSTOPERATIVE DIAGNOSIS:  Coronary occlusive disease.   PROCEDURE:  Coronary artery bypass grafting x1 with the left interior  mammary to the left anterior descending coronary artery off pump.   SURGEON:  Sheliah Plane, MD.   FIRST ASSISTANT:  Rowe Clack, P.A.-C.   BRIEF HISTORY:  The patient is a 60 year old male who presents with  increasing episodes of chest and throat burning with exertion.  Cardiac  catheterization was performed by Dr. Riley Kill which demonstrated total  occlusion of the LAD with jeopardized collaterals coming from the right  circumflex.  Because of the patient's ongoing symptoms coronary artery  bypass grafting was recommended.  The patient agreed and signed an informed  consent.   DESCRIPTION OF PROCEDURE:  With Swan-Ganz and arterial line monitors in  place the patient underwent general endotracheal anesthesia without  incident.  The skin of the chest and legs was prepped with Betadine and  draped in the usual sterile manner.  A mediastinotomy was performed and the  left internal mammary artery was dissected down as a pedicle graft.  The  distal artery was divided and had good free flow.  The pericardium was  opened.  Overall ventricular function appeared preserved.  The patient was  systemically heparinized.  Using the Guidant establishing device, suction  cup was placed on the apex of the heart, however, it was elevated slightly  to allow good exposure of the LAD.  Vessel tapes were placed  circumferentially around the left anterior descending  coronary artery.  The  vessel was opened and tapes were used to control hemostasis.  Using a  running 8-0 Prolene the left internal mammary artery was anastomosed to the  left anterior descending coronary artery.  With the completion of the  anastomosis the tapes were removed, and the fascia of the mammary was tacked  to the epicardium, and the heart was allowed to return back to the  pericardial space.   The sites of anastomosis were free of bleeding.  Two anterior ventricular  pacing wires were applied. The pericardium was loosely reapproximated with  #6 stainless steel wire.  A left pleural tube and mediastinal tube were left  in place.  A #6 stainless steel wire was used to close the sternum, #0  interrupted figure-of-eight Vicryl were used in the subcutaneous tissue, a 2-  0 running subcuticular stitch was used in the subcutaneous tissue, and a 4-0  subcuticular stitch in the skin edges.  Dry dressings were applied.  Sponge  and needle counts were reported as correct at the completion of the  procedure.  The patient tolerated the procedure without obvious  complications and was transferred to the surgical  intensive care unit for  further postoperative care.      Sheliah Plane, MD  Electronically Signed     EG/MEDQ  D:  07/09/2005  T:  07/09/2005  Job:  [id number]   cc:   Cecil Cranker, M.D.  [address]  [address]  [place]  [place] [address]   Lorin Picket A. Gerda Diss, MD  [address]  [place]  [place] [address]  Fax: [phone]

## 2011-04-04 NOTE — Discharge Summary (Signed)
Mark [name], Mark [name] NO.:  [id number]   MEDICAL RECORD NO.:  [id number]          PATIENT TYPE:  INP   LOCATION:  2009                         FACILITY:  [facility]   PHYSICIAN:  Sheliah Plane, MD    DATE OF BIRTH:  [birth year]   DATE OF ADMISSION:  07/08/2005  DATE OF DISCHARGE:  07/10/2005                                 DISCHARGE SUMMARY   HISTORY OF PRESENT ILLNESS:  The patient is a 60 year old male with a 3-4  month history of poorly defined pain in his neck associated with exertion,  especially when walking or swimming.  He had noted several episodes of this  usually when walking uphill, but has also occurred when he was swimming.  These symptoms were typically relieved with rest and lasted approximately 30  seconds in duration.  He denied any nocturnal or rest pain.  The patient has  a history of hyperlipidemia as well as a strong family history of coronary  disease with his father dying at age 72 after being sent home from the  emergency room where he had chest pain.  He has also had several uncles on  his fathers side who died in their early 23s of myocardial infarction.  He  denies diabetes, denies hypertension, denies previous stroke, denies  claudication, and was a remote smoker, but quit 26 years ago.  He denies  renal insufficiency.  He was seen in cardiothoracic consultation by Dr.  Sheliah Plane after cardiac catheterization was done and this was notable  for significant LAD disease with a subtotal occlusion.  Ejection fraction  was noted at 55%.  There was no disease of the circumflex or right coronary  of note.  The patient was recommended surgical revascularization and was  admitted this hospitalization for the procedure.   PAST MEDICAL HISTORY:  As noted above.  In addition, he has a history of  hypothyroidism.   PAST SURGICAL HISTORY:  1.  Appendectomy.  2.  Bilateral inguinal hernia repair.   SOCIAL HISTORY:  He is married with two  children, works as an Personnel officer.   REVIEW OF SYSTEMS:  Otherwise unremarkable.   MEDICATIONS PRIOR TO ADMISSION:  1.  Synthroid 0.1 mg daily.  2.  Toprol 25 mg daily.  3.  Gemfibrozil 600 mg twice a day.  4.  Aspirin 325 mg daily.  5.  Lipitor 40 mg daily.   HOSPITAL COURSE:  The patient was admitted electively and taken to the  operating room on July 08, 2005 at which time he underwent the following  procedure:  Coronary artery bypass grafting x1 with the left internal  mammary artery to the left anterior descending coronary artery, off pump.  The procedure was performed by Sheliah Plane, MD.  The patient tolerated  the procedure well and was taken to the surgical intensive care unit in  stable condition.   POSTOPERATIVE HOSPITAL COURSE:  The patient has done quite well.  He was  extubated without difficulty.  He remained hemodynamically stable.  CPK MB  bands were low.  He maintained stable hemodynamics and all routine lines,  monitors, and  drainage devices were discontinued in the standard fashion.  Postoperative hematocrit postop day one was 32%.  His incision showed good  evidence of healing.  He tolerated advanced increase in activity without  difficulty.  His rhythm has maintained normal sinus.  His overall status  upon review on postop day two was quite stable for discharge per Dr.  Tyrone Sage.   MEDICATIONS ON DISCHARGE:  Preoperative  medications:  1.  Lipitor.  2.  Toprol.  3.  Synthroid.  4.  Aspirin as previously.   Additionally, the patient has been started on:  1.  Plavix 75 mg daily.  2.  He will be given a prescription for Tylox 1-2 q.4-6h. p.r.n. as needed.  3.  Of note his aspirin dose has been changed to 81 mg daily.   FOLLOWUP:  The patient will follow-up with Dr. Blossom Hoops office in two  weeks.  In addition, he will see Dr. Tyrone Sage on July 24, 2005 at 11:20.   FINAL DIAGNOSIS:  Severe single-vessel coronary artery disease now status  post  surgical revascularization as described.   OTHER DIAGNOSES:  As previously listed per the history.      Rowe Clack, P.A.-C.      Sheliah Plane, MD  Electronically Signed    WEG/MEDQ  D:  07/10/2005  T:  07/10/2005  Job:  [id number]   cc:   Sheliah Plane, MD  [address]  [place]  [place] [address]  Email: [email or web address]   Cecil Cranker, M.D.  [address]  [address]  [place]  [place] [address]   Arturo Morton. Riley Kill, M.D. [facility]  [address]  [address]  [place]  [place] [address]   Donna Bernard, M.D.  [address]  [place]  [place] [address]  Fax: [phone]

## 2011-07-28 ENCOUNTER — Telehealth: Payer: Self-pay | Admitting: Cardiology

## 2011-07-28 ENCOUNTER — Other Ambulatory Visit: Payer: Self-pay

## 2011-07-28 NOTE — Telephone Encounter (Signed)
Patient  sees Dr. Diona Browner in [place] office wishes not to see him anymore. Would like to be seen today. C/O discomfort in chest/ neck. Pt states it has been awhile since he haven't had any testing done.

## 2011-07-28 NOTE — Telephone Encounter (Signed)
Mark [name] is requesting an appt today for pain.  Last week he woke up with stabbing pain in his chest after sleeping on his right side.  It lasted a minute and was relieved by getting up and walking.  On Friday, he developed a stabbing pain in the back of his left shoulder blade after walking up a flight of stairs and while changing a device that turns a light off and on.  The pain lasted a minute or two and was relieved by rest.  He was also dizzy at this time.  Today he had a dull pain in his throat which radiated down to his stomach after walking up 3 flights of stairs and walking for 30 minutes.  Once again, it was relieved by rest.  He denies sob or sweating with the pain but states he has had nightsweats for the past few weeks.    Meds: Alprazolam .25mg  one-half tab qhs, Atorvastatin 80mg  qhs, Fenofibrate 160mg  qd, Levoxyl 100mg  qd, Meloxicam 15mg  qd, Metoprolol 25mg  one fourth tab bid, Omeprazole 20mg  qd, ASA 325mg  one half tab daily.  Pt states he saw Dr Corinda Gubler years ago and last saw Dr Diona Browner  approx one and one half years ago but wants to switch back to [employer].

## 2011-07-28 NOTE — Telephone Encounter (Signed)
Per Dr Shirlee Latch.  Pt to have Myoview and appt with cardiologist this week.  Myoview scheduled for 07/29/11 at 8:00am.  Appt with Dr Excell Seltzer scheduled for 07/31/11 at 9:15am.  Pt was notified of both appts.  Myoview instructions given verbally to pt including need to hold Metoprolol 24 hours prior to the test.

## 2011-07-29 ENCOUNTER — Ambulatory Visit (HOSPITAL_COMMUNITY): Payer: BC Managed Care – PPO | Attending: Cardiovascular Disease | Admitting: Radiology

## 2011-07-29 ENCOUNTER — Encounter: Payer: Self-pay | Admitting: *Deleted

## 2011-07-29 DIAGNOSIS — R079 Chest pain, unspecified: Secondary | ICD-10-CM | POA: Insufficient documentation

## 2011-07-29 DIAGNOSIS — I25810 Atherosclerosis of coronary artery bypass graft(s) without angina pectoris: Secondary | ICD-10-CM

## 2011-07-29 DIAGNOSIS — R0789 Other chest pain: Secondary | ICD-10-CM

## 2011-07-29 MED ORDER — TECHNETIUM TC 99M TETROFOSMIN IV KIT
33.0000 | PACK | Freq: Once | INTRAVENOUS | Status: AC | PRN
  Administered 2011-07-29: 33 via INTRAVENOUS

## 2011-07-29 MED ORDER — TECHNETIUM TC 99M TETROFOSMIN IV KIT
11.0000 | PACK | Freq: Once | INTRAVENOUS | Status: AC | PRN
  Administered 2011-07-29: 11 via INTRAVENOUS

## 2011-07-29 NOTE — Progress Notes (Signed)
[facility] SITE 3 NUCLEAR MED [address] [place] [address] [phone]  Cardiology Nuclear Med Study  Mark [name] is a 60 y.o. male [id number] [birth year]   Nuclear Med Background Indication for Stress Test:  Evaluation for Ischemia and Graft Patency History:  '06 CABG; '09 [id number] ischemia at base of antero-septal wall, EF=62% Cardiac Risk Factors: Family History - CAD, History of Smoking and Lipids  Symptoms:  Chest Pressure/Throat Pain with and without Exertion (last episode of chest discomfort was yesterday), Dizziness and Rapid HR   Nuclear Pre-Procedure Caffeine/Decaff Intake:  None NPO After: 8:00pm   Lungs:  Clear. IV 0.9% NS with Angio Cath:  20g  IV Site: R Antecubital  IV Started by:  Irean Hong, RN  Chest Size (in):  42 Cup Size: n/a  Height: 5\' 11"  (1.803 m)  Weight:  186 lb (84.369 kg)  BMI:  Body mass index is 25.94 kg/(m^2). Tech Comments:  Metoprolol held x 24 hours     Nuclear Med Study 1 or 2 day study: 1 day  Stress Test Type:  Stress  Reading MD: [name], MD  Order Authorizing Provider:  Tonny Bollman, MD  Resting Radionuclide: Technetium 49m Tetrofosmin  Resting Radionuclide Dose: 11.0 mCi   Stress Radionuclide:  Technetium 49m Tetrofosmin  Stress Radionuclide Dose: 33.0 mCi           Stress Protocol Rest HR: 59 Stress HR: 148  Rest BP: 119/89 Stress BP: 224/107  Exercise Time (min): 9:00 METS: 10.1   Predicted Max HR: 160 bpm % Max HR: 92.5 bpm Rate Pressure Product: 46962   Dose of Adenosine (mg):  n/a Dose of Lexiscan: n/a mg  Dose of Atropine (mg): n/a Dose of Dobutamine: n/a mcg/kg/min (at max HR)  Stress Test Technologist: Smiley Houseman, CMA-N  Nuclear Technologist:  Domenic Polite, CNMT     Rest Procedure:  Myocardial perfusion imaging was performed at rest 45 minutes following the intravenous administration of Technetium 10m Tetrofosmin.  Rest ECG: No acute changes.  Stress  Procedure:  The patient exercised for nine minutes on the treadmill utilizing the Bruce protocol.  The patient stopped due to fatigue and denied any chest pain.  There were no diagnostic ST-T wave changes.  He did have a hypertensive response at peak exercise, 224/107.  Technetium 22m Tetrofosmin was injected at peak exercise and myocardial perfusion imaging was performed after a brief delay.  Stress ECG: No significant change from baseline ECG  QPS Raw Data Images:  Acquisition technically good; normal left ventricular size. Stress Images:  Normal homogeneous uptake in all areas of the myocardium. Rest Images:  There is decreased uptake in the inferior wall. Subtraction (SDS):  These findings are consistent with ischemia. Transient Ischemic Dilatation (Normal <1.22):  0.62 Lung/Heart Ratio (Normal <0.45):  0.25  Quantitative Gated Spect Images QGS EDV:  71 ml QGS ESV:  23 ml QGS cine images:  NL LV Function; NL Wall Motion QGS EF: 68%  Impression Exercise Capacity:  Good exercise capacity. BP Response:  Hypertensive blood pressure response. Clinical Symptoms:  No chest pain. ECG Impression:  No significant ST segment change suggestive of ischemia. Comparison with Prior Nuclear Study: Inferior ischemia new since previous  Overall Impression:  Abnormal stress nuclear study with very mild inferior ischemia; overall low risk study.  [name]

## 2011-07-30 ENCOUNTER — Encounter: Payer: Self-pay | Admitting: Cardiovascular Disease

## 2011-07-30 NOTE — Progress Notes (Addendum)
Nuclear Report to Dr. Jens Som for review/correction. Mark [name], [name] Please note images at rest revealed normal perfusion and stress images revealed decreased uptake in the inferior wall; these results were reversed in the initial report. [name]

## 2011-07-30 NOTE — Progress Notes (Signed)
Nuclear Report to Dr. Domenic Moras pt 07/31/11). Mark [name], [name]

## 2011-07-30 NOTE — Progress Notes (Signed)
Mild inferior ischemia, overall low risk, hypertensive BP response.  To be seen in office tomorrow.  Dalton [employer]

## 2011-07-31 ENCOUNTER — Encounter: Payer: Self-pay | Admitting: Cardiovascular Disease

## 2011-07-31 ENCOUNTER — Ambulatory Visit (INDEPENDENT_AMBULATORY_CARE_PROVIDER_SITE_OTHER): Payer: BC Managed Care – PPO | Admitting: Cardiovascular Disease

## 2011-07-31 VITALS — BP 118/84 | HR 60 | Ht 71.0 in | Wt 193.8 lb

## 2011-07-31 DIAGNOSIS — I2510 Atherosclerotic heart disease of native coronary artery without angina pectoris: Secondary | ICD-10-CM

## 2011-07-31 DIAGNOSIS — E785 Hyperlipidemia, unspecified: Secondary | ICD-10-CM

## 2011-07-31 NOTE — Patient Instructions (Signed)
Your physician wants you to follow-up in:  6 months. You will receive a reminder letter in the mail two months in advance. If you don't receive a letter, please call our office to schedule the follow-up appointment.   

## 2011-07-31 NOTE — Progress Notes (Signed)
HPI:  This is a 60 year old gentleman presenting for evaluation of chest pain. He has been seen in the [place] office before and wishes to change to [place]. The patient called in with chest pain and a nuclear study was arranged.  He had an exercise Myoview scan where he exercised for 9 minutes according to the Bruce protocol. The patient had a hypertensive response to exercise. There was mild inferior ischemia noted, but the overall impression was that this was a low risk result.  The patient reports 3 episodes of chest discomfort. The first occurred as he carried a ladder up 2-3 flights of stairs. He felt discomfort in his neck and down into the epigastric region. He denies associated shortness of breath, diaphoresis, nausea, or vomiting. He's had 2 other episodes of substernal chest discomfort when lying supine, one at nighttime and the other when he first awoke in the morning. He continues to walk regularly for exercise. The patient walks 30 minutes with no exertional symptoms. He has no other complaints today.  Outpatient Encounter Prescriptions as of 07/31/2011  Medication Sig Dispense Refill  . aspirin 81 MG tablet Take 81 mg by mouth daily.        . fenofibrate (TRICOR) 145 MG tablet Take 145 mg by mouth daily.        Marland Kitchen levothyroxine (SYNTHROID, LEVOTHROID) 100 MCG tablet Take 100 mcg by mouth daily.        . metoprolol tartrate (LOPRESSOR) 25 MG tablet Take 12.5 mg by mouth 2 (two) times daily.        . naproxen (NAPROSYN) 250 MG tablet Take 250 mg by mouth daily as needed.        Marland Kitchen omeprazole (PRILOSEC) 20 MG capsule Take 20 mg by mouth daily.        . pravastatin (PRAVACHOL) 80 MG tablet Take 80 mg by mouth daily.          No Known Allergies  Past Medical History  Diagnosis Date  . Coronary artery disease   . Hyperlipidemia   . Hypothyroidism   . GERD (gastroesophageal reflux disease)   . Esophageal stricture   . Gastrointestinal bleeding   . Dyslipidemia     ROS: Negative  except as per HPI  BP 118/84  Pulse 60  Ht 5\' 11"  (1.803 m)  Wt 193 lb 12.8 oz (87.907 kg)  BMI 27.03 kg/m2  PHYSICAL EXAM: Pt is alert and oriented, pleasant male in NAD HEENT: normal Neck: JVP - normal, carotids 2+= without bruits Lungs: CTA bilaterally CV: RRR without murmur or gallop Abd: soft, NT, Positive BS, no hepatomegaly Ext: no C/C/E, distal pulses intact and equal Skin: warm/dry no rash  EKG:  Normal sinus rhythm with minimal voltage criteria for LVH, maybe normal variant. Heart rate 60 beats per minute.  ASSESSMENT AND PLAN:

## 2011-07-31 NOTE — Assessment & Plan Note (Signed)
The patient is on a combination of pravastatin and fenofibrate. He is followed by Dr Gerda Diss. Goal LDL is less than 100 and ideally less than 70.

## 2011-07-31 NOTE — Assessment & Plan Note (Signed)
The patient's cardiac history was carefully reviewed. His last cardiac catheterization was in 2007 demonstrating wide patency of the RCA left circumflex and a patent LIMA to LAD graft. He now has chest pain with both typical and atypical features. While his stress test result is abnormal, he is noted to be low risk. I discussed diagnostic catheterization with the patient and he prefers to continue observation and watch his symptoms at this point. I think this is perfectly reasonable as he has a good overall cardiac prognosis and his risk of myocardial infarction is low in the setting of a small area of ischemia on his stress study. If he notes any change in his symptoms or progression of chest pain he will call the office and we will arrange catheterization. I discussed his case with his primary care physician, Dr. Lilyan Punt.  His current medical program is good with use of aspirin for antiplatelet therapy, Lopressor, and a statin drug. His blood pressure and heart rate are both in a good range. The patient will followup in 6 months unless there is change in his current symptoms.

## 2011-08-15 ENCOUNTER — Telehealth: Payer: Self-pay | Admitting: Cardiovascular Disease

## 2011-08-15 DIAGNOSIS — I2510 Atherosclerotic heart disease of native coronary artery without angina pectoris: Secondary | ICD-10-CM

## 2011-08-15 DIAGNOSIS — E785 Hyperlipidemia, unspecified: Secondary | ICD-10-CM

## 2011-08-15 NOTE — Telephone Encounter (Signed)
Pt wants to set up heart cath.

## 2011-08-20 NOTE — Telephone Encounter (Signed)
Pt was told someone would call him on Monday regarding this and no one has called.  Is having some chest pain and wants to get this scheduled as soon as possible.  Please call back.

## 2011-08-20 NOTE — Telephone Encounter (Signed)
I reviewed with Dr Excell Seltzer. OK to schedule LHC and grafts 10/10 or 10/12 in [facility] lab right groin. I talked with pt. Pt requesting to have cath 08/29/11. I will call pt tomorrow to schedule.

## 2011-08-21 ENCOUNTER — Encounter: Payer: Self-pay | Admitting: *Deleted

## 2011-08-21 NOTE — Telephone Encounter (Signed)
I have talked with pt. Pt is aware LHC scheduled for 08/29/11. Pt to come for lab 08/25/11.

## 2011-08-21 NOTE — Telephone Encounter (Signed)
Pt scheduled for LHC and grafts 08/29/11 at 8:30am.

## 2011-08-25 ENCOUNTER — Ambulatory Visit (INDEPENDENT_AMBULATORY_CARE_PROVIDER_SITE_OTHER): Payer: BC Managed Care – PPO | Admitting: *Deleted

## 2011-08-25 DIAGNOSIS — E785 Hyperlipidemia, unspecified: Secondary | ICD-10-CM

## 2011-08-25 DIAGNOSIS — I2510 Atherosclerotic heart disease of native coronary artery without angina pectoris: Secondary | ICD-10-CM

## 2011-08-25 LAB — CBC WITH DIFFERENTIAL/PLATELET
Eosinophils Absolute: 0.1 10*3/uL (ref 0.0–0.7)
HCT: 41 % (ref 39.0–52.0)
Lymphs Abs: 1.7 10*3/uL (ref 0.7–4.0)
Monocytes Absolute: 0.3 10*3/uL (ref 0.1–1.0)
Monocytes Relative: 6.8 % (ref 3.0–12.0)
Neutrophils Relative %: 51.9 % (ref 43.0–77.0)
Platelets: 183 10*3/uL (ref 150.0–400.0)
RBC: 4.51 Mil/uL (ref 4.22–5.81)
RDW: 13.6 % (ref 11.5–14.6)

## 2011-08-25 LAB — PROTIME-INR: INR: 0.9 ratio (ref 0.8–1.0)

## 2011-08-25 LAB — BASIC METABOLIC PANEL
Calcium: 9 mg/dL (ref 8.4–10.5)
Creatinine, Ser: 1 mg/dL (ref 0.4–1.5)
GFR: 81.7 mL/min (ref >60.00)
Glucose, Bld: 105 mg/dL — ABNORMAL HIGH (ref 70–99)
Potassium: 4.1 mEq/L (ref 3.5–5.1)

## 2011-08-29 ENCOUNTER — Inpatient Hospital Stay (HOSPITAL_COMMUNITY)
Admission: RE | Admit: 2011-08-29 | Discharge: 2011-08-29 | Disposition: A | Discharge status: Home / Self Care | Payer: BC Managed Care – PPO | Source: Ambulatory Visit | Attending: Cardiovascular Disease | Admitting: Cardiovascular Disease

## 2011-08-29 DIAGNOSIS — R079 Chest pain, unspecified: Secondary | ICD-10-CM | POA: Insufficient documentation

## 2011-08-29 DIAGNOSIS — I2510 Atherosclerotic heart disease of native coronary artery without angina pectoris: Secondary | ICD-10-CM

## 2011-08-29 DIAGNOSIS — E039 Hypothyroidism, unspecified: Secondary | ICD-10-CM | POA: Insufficient documentation

## 2011-08-29 DIAGNOSIS — K219 Gastro-esophageal reflux disease without esophagitis: Secondary | ICD-10-CM | POA: Insufficient documentation

## 2011-08-29 DIAGNOSIS — E785 Hyperlipidemia, unspecified: Secondary | ICD-10-CM | POA: Insufficient documentation

## 2011-08-29 NOTE — Cardiovascular Report (Signed)
NAME:  [name], [name] NO.:  [id number]  MEDICAL RECORD NO.:  [id number]  LOCATION:  CATH                         FACILITY:  [facility]  PHYSICIAN:  Verne Carrow, MDDATE OF BIRTH:  1951-06-28  DATE OF PROCEDURE:  08/29/2011 DATE OF DISCHARGE:                           CARDIAC CATHETERIZATION   PRIMARY CARDIOLOGIST:  Sharol Harness, MD  PRIMARY CARE PHYSICIAN:  Scott A. Gerda Diss, MD  PROCEDURE PERFORMED: 1. Left heart catheterization. 2. Selective coronary angiography. 3. Left internal mammary artery graft angiography. 4. Left ventricular angiogram.  OPERATOR:  Verne Carrow, MD.  ARTERIAL ACCESS SITE:  Right femoral artery with a 4-French sheath.  INDICATIONS:  This is a 60 year old Caucasian male with a history of coronary artery disease status post one-vessel coronary artery bypass grafting remotely as well as hyperlipidemia, GERD, and hypothyroidism who has had recent complaints of chest discomfort.  The patient was evaluated with an exercise Myoview scan during which he exercised for 9 minutes and had a hypertensive response to exercise.  There was possible ischemia in the inferior wall, however, this was felt to be low risk. The patient has continued to have chest discomfort and because of this, a diagnostic catheterization was arranged for today to exclude progression of his coronary artery disease.  DETAILS OF PROCEDURE:  The patient was brought to the outpatient cardiac catheterization laboratory after signing informed consent for the procedure.  The right groin was prepped and draped in sterile fashion. Lidocaine 1% was used for local anesthesia.  A 4-French sheath was inserted into the right femoral artery without difficulty.  Standard diagnostic catheters were used to perform selective coronary angiography.  The left internal mammary artery graft was selectively engaged with a 3DRC catheter which had been used for the right  coronary artery as well.  A pigtail catheter was used to perform a left ventricular angiogram.  The patient tolerated the procedure well.  There were no immediate complications.  The patient was taken to the recovery area in stable condition.  HEMODYNAMIC FINDINGS:  Central aortic pressure 120/75.  Left ventricular pressure 120/5/14.  ANGIOGRAPHIC FINDINGS: 1. The left main coronary artery had a proximal 40% stenosis. 2. The left anterior descending artery had a 100% ostial occlusion.     The proximal mid and distal vessel filled from the left internal     mammary artery graft.  There is also a diagonal branch that fills     in a retrograde fashion from the graft. 3. The circumflex artery gave off an early large caliber obtuse     marginal branch which has mild plaque disease.  The second obtuse     marginal branch is small in caliber with no disease.  The AV groove     circumflex is very small in caliber beyond the second obtuse     marginal branch. 4. The right coronary artery is a large dominant vessel with mild 20%     plaque in the proximal and midportion.  The posterior descending     artery and posterolateral branches are large caliber vessels with     mild plaque disease. 5. The left internal mammary artery graft to the mid LAD is patent  with no disease. 6. Left ventricular angiogram was performed in the RAO projection and     showed normal left ventricular systolic function with ejection     fraction of 60%.  IMPRESSION: 1. Single-vessel coronary artery disease with patent left internal     mammary artery graft to the mid LAD. 2. Mild nonobstructive disease in the circumflex artery and right     coronary artery. 3. Normal left ventricular systolic function.  RECOMMENDATIONS:  I recommend continued medical management of this patient's coronary artery disease.  We will plan follow up with Dr. Excell Seltzer in several weeks.     Verne Carrow, MD     CM/MEDQ   D:  08/29/2011  T:  08/29/2011  Job:  [id number]  cc:   Lorin Picket A. Gerda Diss, MD  Electronically Signed by Verne Carrow MD on 08/29/2011 02:18:10 PM

## 2011-09-12 ENCOUNTER — Inpatient Hospital Stay (HOSPITAL_BASED_OUTPATIENT_CLINIC_OR_DEPARTMENT_OTHER)
Admission: RE | Admit: 2011-09-12 | Payer: BC Managed Care – PPO | Source: Ambulatory Visit | Admitting: Cardiovascular Disease

## 2011-09-15 ENCOUNTER — Encounter: Payer: BC Managed Care – PPO | Admitting: Physician Assistant

## 2012-02-04 ENCOUNTER — Ambulatory Visit (INDEPENDENT_AMBULATORY_CARE_PROVIDER_SITE_OTHER): Payer: BC Managed Care – PPO | Admitting: Cardiovascular Disease

## 2012-02-04 ENCOUNTER — Encounter: Payer: Self-pay | Admitting: Cardiovascular Disease

## 2012-02-04 VITALS — BP 130/84 | HR 52 | Ht 71.0 in | Wt 197.0 lb

## 2012-02-04 DIAGNOSIS — E785 Hyperlipidemia, unspecified: Secondary | ICD-10-CM

## 2012-02-04 DIAGNOSIS — I2510 Atherosclerotic heart disease of native coronary artery without angina pectoris: Secondary | ICD-10-CM

## 2012-02-04 NOTE — Assessment & Plan Note (Signed)
The patient is stable without recurrence of angina. We'll continue his current medical program which includes antiplatelet therapy with aspirin, a statin drug as well as fenofibrate, and metoprolol.

## 2012-02-04 NOTE — Assessment & Plan Note (Signed)
The patient is on high-dose Lipitor. He is followed by Dr. Gerda Diss.

## 2012-02-04 NOTE — Patient Instructions (Signed)
Your physician wants you to follow-up in: 1 YEAR.  You will receive a reminder letter in the mail two months in advance. If you don't receive a letter, please call our office to schedule the follow-up appointment.  Your physician recommends that you continue on your current medications as directed. Please refer to the Current Medication list given to you today.  

## 2012-02-04 NOTE — Progress Notes (Signed)
   HPI:  61 year old gentleman presenting for followup evaluation. The patient has coronary artery disease with history of coronary bypass surgery. He underwent cardiac catheterization last year after an abnormal nuclear scan when he presented with chest pain. His cardiac catheterization showed no significant coronary problems. His right coronary artery and left circumflex were widely patent and his LIMA to LAD graft was widely patent. He has been managed medically and has had no further problems. He specifically denies chest pain or pressure. He denies dyspnea, edema, or palpitations. The patient walks about 3 days per week for 30 minutes.  Outpatient Encounter Prescriptions as of 02/04/2012  Medication Sig Dispense Refill  . ALPRAZolam (XANAX) 0.25 MG tablet Take 0.25 mg by mouth at bedtime as needed.      Marland Kitchen aspirin 81 MG tablet 1/2 tab po qd      . atorvastatin (LIPITOR) 80 MG tablet Take 80 mg by mouth daily.      . fenofibrate 160 MG tablet Take 160 mg by mouth daily.      Marland Kitchen levothyroxine (SYNTHROID, LEVOTHROID) 100 MCG tablet Take 100 mcg by mouth daily.        . meloxicam (MOBIC) 15 MG tablet Take 15 mg by mouth daily.      . metoprolol tartrate (LOPRESSOR) 25 MG tablet 1/4 tab po bid      . naproxen (NAPROSYN) 250 MG tablet Take 250 mg by mouth daily as needed.        Marland Kitchen omeprazole (PRILOSEC) 20 MG capsule Take 20 mg by mouth daily.        Marland Kitchen DISCONTD: fenofibrate (TRICOR) 145 MG tablet Take 145 mg by mouth daily.        Marland Kitchen DISCONTD: pravastatin (PRAVACHOL) 80 MG tablet Take 80 mg by mouth daily.          No Known Allergies  Past Medical History  Diagnosis Date  . Coronary artery disease   . Hyperlipidemia   . Hypothyroidism   . GERD (gastroesophageal reflux disease)   . Esophageal stricture   . Gastrointestinal bleeding   . Dyslipidemia     ROS: Negative except as per HPI  BP 130/84  Pulse 52  Ht 5\' 11"  (1.803 m)  Wt 89.359 kg (197 lb)  BMI 27.48 kg/m2  PHYSICAL EXAM: Pt  is alert and oriented, NAD HEENT: normal Neck: JVP - normal, carotids 2+= without bruits Lungs: CTA bilaterally CV: RRR without murmur or gallop Abd: soft, NT, Positive BS, no hepatomegaly Ext: no C/C/E, distal pulses intact and equal Skin: warm/dry no rash  EKG:  Sinus bradycardia 52 beats per minute, otherwise within normal limits.  ASSESSMENT AND PLAN:

## 2013-01-12 ENCOUNTER — Other Ambulatory Visit: Payer: Self-pay

## 2013-02-21 ENCOUNTER — Telehealth: Payer: Self-pay | Admitting: Family Medicine

## 2013-02-21 MED ORDER — METOPROLOL TARTRATE 25 MG PO TABS: ORAL_TABLET | ORAL | Status: DC

## 2013-02-21 NOTE — Telephone Encounter (Signed)
Med faxed to [facility].

## 2013-02-21 NOTE — Telephone Encounter (Signed)
Patient needs a prescription wrote for his Metoprolol 25 mg and faxed to [facility] at [phone]

## 2013-03-25 ENCOUNTER — Other Ambulatory Visit: Payer: Self-pay | Admitting: Family Medicine

## 2013-04-04 ENCOUNTER — Encounter: Payer: Self-pay | Admitting: *Deleted

## 2013-04-05 ENCOUNTER — Telehealth: Payer: Self-pay | Admitting: Family Medicine

## 2013-04-05 NOTE — Telephone Encounter (Signed)
Pt had steroid shot on 5/15, since then his BP has been elevated. Pt is concerned, I made him an appt for 5/21 at 8 am, yesterday. He is calling again this morning because he has checked it again several times since and it keeps creeping up.  Yesterday when I spoke with pt I asked him if he was having any HA or dizziness associated with this, he stated no. Today however he says he did yesterday have this but just didn't tell me. He states today he is having HA and a little dizziness still along with the elevation. I also advised the pt that sometimes when you check an recheck the blood pressure like that you can cause anxiety and elevate the BP, to try an relax then check it at a later time. Does he need to be seen today essentially or can he just keep his appt for tomorrow morning?

## 2013-04-06 ENCOUNTER — Encounter: Payer: Self-pay | Admitting: Family Medicine

## 2013-04-06 ENCOUNTER — Ambulatory Visit (INDEPENDENT_AMBULATORY_CARE_PROVIDER_SITE_OTHER): Payer: BC Managed Care – PPO | Admitting: Family Medicine

## 2013-04-06 VITALS — BP 140/102 | HR 64 | Wt 195.0 lb

## 2013-04-06 DIAGNOSIS — I10 Essential (primary) hypertension: Secondary | ICD-10-CM

## 2013-04-06 HISTORY — DX: Essential (primary) hypertension: I10

## 2013-04-06 MED ORDER — LISINOPRIL 5 MG PO TABS: 5.0000 mg | ORAL_TABLET | Freq: Every day | ORAL | Status: DC

## 2013-04-06 NOTE — Patient Instructions (Signed)
Start lisinopril 5mg :   Take 1/2 tablet per day Check bp 3 times a week Recheck in 3 weeks

## 2013-04-06 NOTE — Progress Notes (Signed)
  Subjective:    Patient ID: Mark [name], male    DOB: 03/23/1951, 62 y.o.   MRN: [id number]  Hypertension This is a chronic problem. The current episode started more than 1 year ago. The problem is uncontrolled. Associated symptoms include headaches. Pertinent negatives include no chest pain, orthopnea or shortness of breath. (Lightheaded) Risk factors for coronary artery disease include diabetes mellitus. The current treatment provides no improvement. There are no compliance problems.  Hypertensive end-organ damage includes CAD/MI.   Knee pain- hurt after squatting.   Review of Systems  Respiratory: Negative for shortness of breath.   Cardiovascular: Negative for chest pain and orthopnea.  Musculoskeletal:       R knee pain  Neurological: Positive for headaches.       Objective:   Physical Exam  Vitals reviewed. Constitutional: He appears well-developed.  HENT:  Head: Normocephalic.  Neck: Normal range of motion. No JVD present.  Cardiovascular: Normal rate, regular rhythm and normal heart sounds.   Pulmonary/Chest: Effort normal and breath sounds normal. No respiratory distress. He has no wheezes.  Musculoskeletal:  Right knee, no ligament laxity, most likely strain of ligaments  Skin: He is not diaphoretic.          Assessment & Plan:  r knee pain-I think this is due to his squatting it should be gradually getting better no need for orthopedic referral at this point HTN- lisinopril 5 mg , 1/2 qd, reck 3 wk,Proper diet was discussed maintain other medications and goals were discussed regarding blood pressure. He will followup in 3 weeks to recheck blood pressure

## 2013-04-06 NOTE — Telephone Encounter (Signed)
Pt made appt for May 21.

## 2013-04-15 ENCOUNTER — Other Ambulatory Visit: Payer: Self-pay | Admitting: Family Medicine

## 2013-04-19 ENCOUNTER — Telehealth: Payer: Self-pay | Admitting: Family Medicine

## 2013-04-19 NOTE — Telephone Encounter (Signed)
Patient stated his readings lately are 123/92, 125/94, 131/100, 132/100, 130/94- these are the normal reading for him these readings are after his blood pressure meds

## 2013-04-19 NOTE — Telephone Encounter (Signed)
Noted , increase med as stated, follow up as planned

## 2013-04-19 NOTE — Telephone Encounter (Signed)
Left message with male to have patient return call  

## 2013-04-19 NOTE — Telephone Encounter (Signed)
Nurse discuss with Patient, if bp elevated then may increase to the whole tablet at this time and follow up as planned in June

## 2013-04-19 NOTE — Telephone Encounter (Signed)
Discussed with patient. Patient verbalized understanding. 

## 2013-04-19 NOTE — Telephone Encounter (Signed)
Pt calls stating that his BP seems to still be elevated, wants to know if you think he should go to the whole pill instead of just the half the pill? Or keep doing the regimen you gave him on 5/21 an wait till his appt 6/11 to discuss?

## 2013-04-27 ENCOUNTER — Encounter: Payer: Self-pay | Admitting: Family Medicine

## 2013-04-27 ENCOUNTER — Ambulatory Visit (INDEPENDENT_AMBULATORY_CARE_PROVIDER_SITE_OTHER): Payer: BC Managed Care – PPO | Admitting: Family Medicine

## 2013-04-27 VITALS — BP 132/88 | Wt 195.2 lb

## 2013-04-27 DIAGNOSIS — E785 Hyperlipidemia, unspecified: Secondary | ICD-10-CM

## 2013-04-27 DIAGNOSIS — I10 Essential (primary) hypertension: Secondary | ICD-10-CM

## 2013-04-27 DIAGNOSIS — E039 Hypothyroidism, unspecified: Secondary | ICD-10-CM

## 2013-04-27 DIAGNOSIS — G4730 Sleep apnea, unspecified: Secondary | ICD-10-CM

## 2013-04-27 MED ORDER — LISINOPRIL 10 MG PO TABS: 10.0000 mg | ORAL_TABLET | Freq: Every day | ORAL | Status: DC

## 2013-04-27 NOTE — Progress Notes (Signed)
  Subjective:    Patient ID: Mark [name], male    DOB: 05-07-1951, 62 y.o.   MRN: [id number]  HPI Patient was several different things first thing is followup regarding blood pressure the majority of the readings he did at home was elevated above what we would like for him to be in addition to this he also relates he's had a couple different times where he felt heaviness and tightness in his chest. In addition to this he also has had sometimes the palpitations plus also his wife has noted that he's had a few different times where he seemed to stop breathing at night plus she snores every night and has fatigue and tiredness during the day he has a past medical history hyperlipidemia hypothyroidism hypertension family history noncontributory social does not smoke   Review of Systems See above he denies swelling in the legs abdominal pain headaches neck pain denies shortness of breath with activity    Objective:   Physical Exam Blood pressure borderline 130/88 both arms on multiple checks lungs are clear no crackles heart is regular no murmurs pulses normal abdomen is soft no guarding or rebound extremities no edema       Assessment & Plan:  #1 hypertension increase lisinopril 10 mg daily check lab work in the near future to make sure kidneys are functioning well with medication #2 intermittent palpitations along with chest discomfort he has an appointment with Dr. Excell Seltzer on July 2 most likely will need telemetry plus strong probability of also needing a stress test. #3 hypothyroidism check lab work #4 hyperlipidemia check lab work healthy diet recommended continue medications #5 probable sleep apnea to do sleep study await the results.

## 2013-04-28 ENCOUNTER — Telehealth: Payer: Self-pay | Admitting: *Deleted

## 2013-04-28 ENCOUNTER — Other Ambulatory Visit: Payer: Self-pay

## 2013-04-28 DIAGNOSIS — R002 Palpitations: Secondary | ICD-10-CM

## 2013-04-28 NOTE — Telephone Encounter (Signed)
[facility]NA 04/28/13 1545

## 2013-04-29 LAB — HEPATIC FUNCTION PANEL
AST: 20 U/L (ref 0–37)
Albumin: 4.2 g/dL (ref 3.5–5.2)
Alkaline Phosphatase: 32 U/L — ABNORMAL LOW (ref 39–117)
Indirect Bilirubin: 0.7 mg/dL (ref 0.0–0.9)
Total Bilirubin: 0.9 mg/dL (ref 0.3–1.2)

## 2013-04-29 LAB — LIPID PANEL
HDL: 48 mg/dL (ref >39)
LDL Cholesterol: 88 mg/dL (ref 0–99)
Triglycerides: 100 mg/dL (ref <150)

## 2013-04-29 LAB — BASIC METABOLIC PANEL
BUN: 23 mg/dL (ref 6–23)
Chloride: 104 mEq/L (ref 96–112)
Creat: 1.01 mg/dL (ref 0.50–1.35)
Glucose, Bld: 102 mg/dL — ABNORMAL HIGH (ref 70–99)
Potassium: 4.7 mEq/L (ref 3.5–5.3)

## 2013-05-02 ENCOUNTER — Encounter (INDEPENDENT_AMBULATORY_CARE_PROVIDER_SITE_OTHER): Payer: BC Managed Care – PPO

## 2013-05-02 ENCOUNTER — Encounter: Payer: Self-pay | Admitting: *Deleted

## 2013-05-02 DIAGNOSIS — R002 Palpitations: Secondary | ICD-10-CM

## 2013-05-02 NOTE — Progress Notes (Signed)
Patient ID: Mark [name], male   DOB: [birth year], 62 y.o.   MRN: [id number] E-Cardio verite 30 day event monitor placed on patient.

## 2013-05-10 ENCOUNTER — Telehealth: Payer: Self-pay | Admitting: Family Medicine

## 2013-05-10 NOTE — Telephone Encounter (Signed)
Patient states Blood Pressure has been low the past few days.   Please call patient he is wondering if he needs to decrease medication or needs office visit.  Thanks

## 2013-05-10 NOTE — Telephone Encounter (Signed)
Patient was denies to reduce 5 mg daily he will cut his 10 mg tablet in half if he has any difficulty he will let us know. He will see Dr. Excell Seltzer in several days. If he wants [place] to call in the 5 mg tablets in the near future he will call and let us know as well. His blood pressure was running 100/70 on 10 mg which is too low. He will let us know if ongoing troubles.

## 2013-05-11 ENCOUNTER — Other Ambulatory Visit: Payer: Self-pay

## 2013-05-11 DIAGNOSIS — G4730 Sleep apnea, unspecified: Secondary | ICD-10-CM

## 2013-05-18 ENCOUNTER — Ambulatory Visit (INDEPENDENT_AMBULATORY_CARE_PROVIDER_SITE_OTHER): Payer: BC Managed Care – PPO | Admitting: Cardiovascular Disease

## 2013-05-18 ENCOUNTER — Encounter: Payer: Self-pay | Admitting: Cardiovascular Disease

## 2013-05-18 VITALS — BP 130/89 | HR 62 | Ht 71.0 in | Wt 196.0 lb

## 2013-05-18 DIAGNOSIS — I2510 Atherosclerotic heart disease of native coronary artery without angina pectoris: Secondary | ICD-10-CM

## 2013-05-18 DIAGNOSIS — E785 Hyperlipidemia, unspecified: Secondary | ICD-10-CM

## 2013-05-18 DIAGNOSIS — I10 Essential (primary) hypertension: Secondary | ICD-10-CM

## 2013-05-18 NOTE — Progress Notes (Signed)
HPI:  62 year old gentleman presenting for followup evaluation. The patient has coronary artery disease status post single-vessel CABG with LIMA to LAD. His last heart catheterization was in 2012 when his mammary artery was shown to be widely patent to the mid LAD. He had minor nonobstructive disease the left circumflex and right coronary arteries with normal LV function. Last lipids were checked 04/29/2013 with a cholesterol of 156 HDL 48, LDL 88, and triglycerides 960.  He describes episodes of discomfort in his chest, only at rest. Feels pressure in the center or left side of the chest. However, with physical exertion such as climbing hills he has no discomfort and feels well. No dyspnea or edema. Also has had palpitations with associated lightheadedness. After discussion with Dr Gerda Diss, we have ordered an event recorder which he is currently wearing. No significant arrhythmias to date.  Outpatient Encounter Prescriptions as of 05/18/2013  Medication Sig Dispense Refill  . ALPRAZolam (XANAX) 0.25 MG tablet Take 0.25 mg by mouth at bedtime as needed.      Marland Kitchen aspirin 81 MG tablet 81 mg daily.       Marland Kitchen atorvastatin (LIPITOR) 80 MG tablet TAKE ONE TABLET BY MOUTH EVERY DAY  30 tablet  1  . cyclobenzaprine (FLEXERIL) 10 MG tablet Take 10 mg by mouth 3 (three) times daily as needed for muscle spasms.      . fenofibrate 160 MG tablet TAKE ONE TABLET BY MOUTH EVERY DAY  30 tablet  1  . levothyroxine (SYNTHROID, LEVOTHROID) 100 MCG tablet Take 100 mcg by mouth daily.        Marland Kitchen lisinopril (PRINIVIL,ZESTRIL) 10 MG tablet Take 1 tablet (10 mg total) by mouth daily.  30 tablet  11  . metoprolol tartrate (LOPRESSOR) 25 MG tablet 1/4 tab po bid  45 tablet  1  . naproxen (NAPROSYN) 500 MG tablet Take 500 mg by mouth 2 (two) times daily with a meal.      . omeprazole (PRILOSEC) 20 MG capsule Take 20 mg by mouth daily.        Marland Kitchen UNKNOWN TO PATIENT hydrocodone       No facility-administered encounter medications on  file as of 05/18/2013.    Allergies  Allergen Reactions  . Meloxicam Rash    Past Medical History  Diagnosis Date  . Coronary artery disease   . Hyperlipidemia   . Hypothyroidism   . GERD (gastroesophageal reflux disease)   . Esophageal stricture   . Gastrointestinal bleeding   . Dyslipidemia     ROS: Negative except as per HPI  BP 130/89  Pulse 62  Ht 5\' 11"  (1.803 m)  Wt 88.905 kg (196 lb)  BMI 27.35 kg/m2  PHYSICAL EXAM: Pt is alert and oriented, NAD HEENT: normal Neck: JVP - normal, carotids 2+= without bruits Lungs: CTA bilaterally CV: RRR without murmur or gallop Abd: soft, NT, Positive BS, no hepatomegaly Ext: no C/C/E, distal pulses intact and equal Skin: warm/dry no rash  EKG:  NSR, HR 62 bpm, low voltage QRS  ASSESSMENT AND PLAN: 1. CAD s/p CABG with patent LIMA-LAD last cath in 2012. Symptoms primarily at rest and are atypical. I reassured him today as I suspect this is noncardiac. With a patent mammary graft at last cath and no other obstructive CAD, as well as an absence of exertional symptoms, ischemic origin of symptoms is unlikely. He will call back if a change in his symptoms.   2. Palpitations. Event recorder will be reviewed when complete.  LV function has been normal. No significant arrhythmia to date.  3. HTN. Controlled on ACE/beta-blocker  4. Dyslipidemia. On high-potency statin. Followed by Dr Gerda Diss.  Tonny Bollman 05/22/2013 2:10 PM

## 2013-05-18 NOTE — Patient Instructions (Addendum)
Your physician wants you to follow-up in: 1 YEAR with Dr Cooper.  You will receive a reminder letter in the mail two months in advance. If you don't receive a letter, please call our office to schedule the follow-up appointment.  Your physician recommends that you continue on your current medications as directed. Please refer to the Current Medication list given to you today.  

## 2013-05-23 ENCOUNTER — Encounter: Payer: Self-pay | Admitting: Family Medicine

## 2013-05-23 ENCOUNTER — Ambulatory Visit: Payer: BC Managed Care – PPO | Attending: Family Medicine | Admitting: Sleep Medicine

## 2013-05-23 DIAGNOSIS — G4733 Obstructive sleep apnea (adult) (pediatric): Secondary | ICD-10-CM | POA: Insufficient documentation

## 2013-05-23 DIAGNOSIS — G4730 Sleep apnea, unspecified: Secondary | ICD-10-CM

## 2013-05-23 DIAGNOSIS — G4737 Central sleep apnea in conditions classified elsewhere: Secondary | ICD-10-CM | POA: Insufficient documentation

## 2013-05-24 NOTE — Patient Instructions (Signed)

## 2013-05-26 ENCOUNTER — Other Ambulatory Visit: Payer: Self-pay | Admitting: Family Medicine

## 2013-05-26 ENCOUNTER — Encounter: Payer: Self-pay | Admitting: Family Medicine

## 2013-05-27 ENCOUNTER — Other Ambulatory Visit: Payer: Self-pay | Admitting: Family Medicine

## 2013-05-27 MED ORDER — LISINOPRIL 2.5 MG PO TABS: 2.5000 mg | ORAL_TABLET | Freq: Every day | ORAL | Status: DC

## 2013-05-30 NOTE — Procedures (Signed)
[facility] Damon Hargrove A. Gerilyn Pilgrim, MD     [email or web address]        NAMERANJIT, ASHURST             ACCOUNT NO.:  [id number]  MEDICAL RECORD NO.:  [id number]          PATIENT TYPE:  OUT  LOCATION:  SLEEP LAB                     FACILITY:  [facility]  PHYSICIAN:  Tessy Pawelski A. Gerilyn Pilgrim, M.D. DATE OF BIRTH:  1950-12-24  DATE OF STUDY:  05/23/2013                           NOCTURNAL POLYSOMNOGRAM  REFERRING PHYSICIAN:  Scott A. Gerda Diss, MD  REFERRING PHYSICIAN:  Scott A. Gerda Diss, MD  INDICATION FOR STUDY:  A 62 year old man who presents with fatigue, snoring, and hypersomnia.  This study is being done to evaluate for sleep apnea syndrome.  MEDICATIONS:  Lisinopril, metoprolol, levothyroxine, cyclobenzaprine, alprazolam, aspirin, naproxen.  EPWORTH SLEEPINESS SCORE:  7.  BMI 27.  ARCHITECTURAL SUMMARY:  This is a split night recording with initial portion being a diagnostic and the second portion a titration recording. The total recording time is 417 minutes.  Sleep efficiency is 71%. Sleep latency 11%.  REM latency calculated by computer 23, but recalculated at 63.5 minutes.  RESPIRATORY SUMMARY:  Baseline oxygen saturation is 94, lowest saturation 84 during non-REM sleep.  Diagnostic AHI is 29.  The patient did have a significant number of central events.  There were 4 obstructive apneas and 16 central apneas.  There were 18 mixed apneas and 14 hypopneas.  The patient was placed on positive pressure between 5 and optimal pressure 7 with resolution of events and good tolerance.  LIMB MOVEMENT SUMMARY:  PLM index 0.  ELECTROCARDIOGRAM SUMMARY:  Average heart rate is 55 with no significant dysrhythmias observed.  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  The patient responded well, however, the positive pressure using CPAP of 7.     Ariani Seier A. Gerilyn Pilgrim, M.D.    KAD/MEDQ  D:  05/30/2013 10:02:04  T:   05/30/2013 10:17:06  Job:  [id number]

## 2013-05-31 ENCOUNTER — Other Ambulatory Visit: Payer: Self-pay | Admitting: Family Medicine

## 2013-06-07 ENCOUNTER — Telehealth: Payer: Self-pay | Admitting: Family Medicine

## 2013-06-07 NOTE — Telephone Encounter (Signed)
Patient calling to see if he should get BW done for his physical next month?  And also, on last BW he had last month he was wondering if his thyroid was checked.

## 2013-06-08 ENCOUNTER — Telehealth: Payer: Self-pay | Admitting: Family Medicine

## 2013-06-08 ENCOUNTER — Other Ambulatory Visit: Payer: Self-pay | Admitting: *Deleted

## 2013-06-08 DIAGNOSIS — Z125 Encounter for screening for malignant neoplasm of prostate: Secondary | ICD-10-CM

## 2013-06-08 DIAGNOSIS — R739 Hyperglycemia, unspecified: Secondary | ICD-10-CM

## 2013-06-08 NOTE — Telephone Encounter (Signed)
Do hemoglobin A1c and PSA. Sal patient's thyroid was checked as well his lipid.

## 2013-06-08 NOTE — Telephone Encounter (Signed)
error 

## 2013-06-08 NOTE — Telephone Encounter (Signed)
bw papers upfront to be picked up and let pt know tsh was done on last bloodwork.

## 2013-06-10 NOTE — Telephone Encounter (Signed)
Notified patient bloodwork ready for pickup and TSH and Lipid was done on last BW.

## 2013-06-17 ENCOUNTER — Other Ambulatory Visit: Payer: Self-pay | Admitting: Family Medicine

## 2013-06-24 ENCOUNTER — Other Ambulatory Visit: Payer: Self-pay | Admitting: Family Medicine

## 2013-07-01 ENCOUNTER — Telehealth: Payer: Self-pay | Admitting: Family Medicine

## 2013-07-01 MED ORDER — LOSARTAN POTASSIUM 50 MG PO TABS: 50.0000 mg | ORAL_TABLET | Freq: Every day | ORAL | Status: DC

## 2013-07-01 NOTE — Telephone Encounter (Signed)
RX was sent to [facility] per patient request. Patient notified.

## 2013-07-01 NOTE — Telephone Encounter (Signed)
Can not locate Chart  Would like to have his Blood Pressure medication changed.  Has a dry cough that will not go away.  [facility] in [place]  Patient states he only has two of the current pills left and would like the medication before this has to be refilled.  Please call Patient. Thanks

## 2013-07-01 NOTE — Telephone Encounter (Signed)
Cozaar 5o generic one daily 30 6 ref

## 2013-07-01 NOTE — Telephone Encounter (Signed)
See patient email message on 05/26/13. Patient was prescribed Lisinopril 2.5 mg one tab daily.

## 2013-07-08 LAB — PSA: PSA: 0.71 ng/mL (ref <=4.00)

## 2013-07-11 ENCOUNTER — Ambulatory Visit (INDEPENDENT_AMBULATORY_CARE_PROVIDER_SITE_OTHER): Payer: BC Managed Care – PPO | Admitting: Family Medicine

## 2013-07-11 ENCOUNTER — Encounter: Payer: Self-pay | Admitting: Family Medicine

## 2013-07-11 VITALS — BP 122/88 | Ht 71.0 in | Wt 196.6 lb

## 2013-07-11 DIAGNOSIS — L80 Vitiligo: Secondary | ICD-10-CM

## 2013-07-11 DIAGNOSIS — Z0000 Encounter for general adult medical examination without abnormal findings: Secondary | ICD-10-CM

## 2013-07-11 DIAGNOSIS — G4733 Obstructive sleep apnea (adult) (pediatric): Secondary | ICD-10-CM

## 2013-07-11 HISTORY — DX: Vitiligo: L80

## 2013-07-11 HISTORY — DX: Obstructive sleep apnea (adult) (pediatric): G47.33

## 2013-07-11 MED ORDER — ALPRAZOLAM 0.25 MG PO TABS: 0.2500 mg | ORAL_TABLET | Freq: Every evening | ORAL | Status: DC | PRN

## 2013-07-11 NOTE — Progress Notes (Signed)
  Subjective:    Patient ID: Mark [name], male    DOB: Dec 07, 1950, 62 y.o.   MRN: [id number]  HPIHere for annual physical. The patient comes in today for a wellness visit.  A review of their health history was completed.  A review of medications was also completed. Any necessary refills were discussed. His blood pressure recently has been fairly good no low spells. Sensible healthy diet was discussed. Importance of minimizing excessive salt and carbohydrates was also discussed. He does like to eat but he does state he tries to be healthy with his food choices Safety was stressed including driving, activities at work and at home where applicable. Importance of regular physical activity for overall health was discussed. Preventative measures appropriate for age were discussed. Patient will be due colonoscopy next year patient aware of this. Time was spent with the patient discussing any concerns they have about their well-being.   Concerned about blisters on scalp that started 3 months ago. Patient being treated by dermatology. He is following up there in January.  Patient does have sleep apnea he will be getting his CPAP machine soon this should help    Review of Systems  Constitutional: Negative for fever, activity change and appetite change.  HENT: Negative for congestion, rhinorrhea and neck pain.   Eyes: Negative for discharge.  Respiratory: Negative for cough and wheezing.   Cardiovascular: Negative for chest pain.  Gastrointestinal: Negative for vomiting, abdominal pain and blood in stool.  Genitourinary: Negative for frequency and difficulty urinating.  Skin: Negative for rash.  Allergic/Immunologic: Negative for environmental allergies and food allergies.  Neurological: Negative for weakness and headaches.  Psychiatric/Behavioral: Negative for agitation.       Objective:   Physical Exam  Constitutional: He appears well-developed and well-nourished.  HENT:  Head:  Normocephalic and atraumatic.  Right Ear: External ear normal.  Left Ear: External ear normal.  Nose: Nose normal.  Mouth/Throat: Oropharynx is clear and moist.  Eyes: EOM are normal. Pupils are equal, round, and reactive to light.  Neck: Normal range of motion. Neck supple. No thyromegaly present.  Cardiovascular: Normal rate, regular rhythm and normal heart sounds.   No murmur heard. Pulmonary/Chest: Effort normal and breath sounds normal. No respiratory distress. He has no wheezes.  Abdominal: Soft. Bowel sounds are normal. He exhibits no distension and no mass. There is no tenderness.  Genitourinary: Penis normal.  Musculoskeletal: Normal range of motion. He exhibits no edema.  Lymphadenopathy:    He has no cervical adenopathy.  Neurological: He is alert. He exhibits normal muscle tone.  Skin: Skin is warm and dry. No erythema.  Psychiatric: He has a normal mood and affect. His behavior is normal. Judgment normal.          Assessment & Plan:  Wellness-the medications were reviewed with him. He seems to be tolerating everything well. He will get colonoscopy next year. Prostate exam today was normal PSA was normal the flu vaccine this fall. Wellness exam in one year. Followup office visit in 6 months.  Sleep apnea he will get his machine soon this should help him  Has vitiligo followed by dermatology thyroid function recently good no need to adjust that

## 2013-07-22 ENCOUNTER — Other Ambulatory Visit: Payer: Self-pay | Admitting: Family Medicine

## 2013-07-28 ENCOUNTER — Other Ambulatory Visit: Payer: Self-pay | Admitting: Family Medicine

## 2013-09-16 ENCOUNTER — Encounter: Payer: Self-pay | Admitting: Family Medicine

## 2013-09-16 ENCOUNTER — Ambulatory Visit (INDEPENDENT_AMBULATORY_CARE_PROVIDER_SITE_OTHER): Payer: BC Managed Care – PPO | Admitting: Family Medicine

## 2013-09-16 VITALS — BP 118/80 | Ht 71.0 in | Wt 200.2 lb

## 2013-09-16 DIAGNOSIS — N509 Disorder of male genital organs, unspecified: Secondary | ICD-10-CM

## 2013-09-16 DIAGNOSIS — Z23 Encounter for immunization: Secondary | ICD-10-CM

## 2013-09-16 DIAGNOSIS — N50812 Left testicular pain: Secondary | ICD-10-CM

## 2013-09-16 NOTE — Progress Notes (Signed)
  Subjective:    Patient ID: Mark [name], male    DOB: [birth year], 62 y.o.   MRN: [id number]  Testicle Pain The patient's primary symptoms include scrotal swelling and testicular pain. This is a new problem. The current episode started more than 1 month ago. The problem occurs constantly. The problem has been unchanged. The pain is medium. The testicular pain affects the left testicle. There is swelling in both testicles. The color of the testicles is red. Nothing aggravates the symptoms. He has tried nothing for the symptoms. The treatment provided no relief.   Denies high fever chills vomiting dysuria hematuria sweats chills   Review of Systems  Genitourinary: Positive for scrotal swelling and testicular pain.       Objective:   Physical Exam  Lungs clear hearts regular testicular exam some tenderness on the left side with some slight firmness could have a varicocele.      Assessment & Plan:  Ultrasound testicles next week. Await the results. No sign of any type of torsion. I doubt cancer but I cannot rule this out. Await ultrasound result.

## 2013-09-21 ENCOUNTER — Other Ambulatory Visit: Payer: Self-pay | Admitting: Family Medicine

## 2013-09-21 ENCOUNTER — Ambulatory Visit (HOSPITAL_COMMUNITY)
Admission: RE | Admit: 2013-09-21 | Discharge: 2013-09-21 | Disposition: A | Discharge status: Home / Self Care | Payer: BC Managed Care – PPO | Source: Ambulatory Visit | Attending: Family Medicine | Admitting: Family Medicine

## 2013-09-21 DIAGNOSIS — N50812 Left testicular pain: Secondary | ICD-10-CM

## 2013-09-21 DIAGNOSIS — N509 Disorder of male genital organs, unspecified: Secondary | ICD-10-CM | POA: Insufficient documentation

## 2013-09-21 DIAGNOSIS — N433 Hydrocele, unspecified: Secondary | ICD-10-CM | POA: Insufficient documentation

## 2013-09-21 DIAGNOSIS — N508 Other specified disorders of male genital organs: Secondary | ICD-10-CM | POA: Insufficient documentation

## 2013-11-15 ENCOUNTER — Other Ambulatory Visit: Payer: Self-pay | Admitting: Family Medicine

## 2013-11-15 NOTE — Telephone Encounter (Signed)
Ok plus 2 ref 

## 2013-12-22 ENCOUNTER — Encounter: Payer: Self-pay | Admitting: Internal Medicine

## 2013-12-25 ENCOUNTER — Other Ambulatory Visit: Payer: Self-pay | Admitting: Family Medicine

## 2014-01-09 ENCOUNTER — Other Ambulatory Visit: Payer: Self-pay | Admitting: Family Medicine

## 2014-01-23 ENCOUNTER — Other Ambulatory Visit: Payer: Self-pay | Admitting: Family Medicine

## 2014-02-03 ENCOUNTER — Ambulatory Visit (INDEPENDENT_AMBULATORY_CARE_PROVIDER_SITE_OTHER): Payer: BC Managed Care – PPO | Admitting: Family Medicine

## 2014-02-03 ENCOUNTER — Encounter: Payer: Self-pay | Admitting: Family Medicine

## 2014-02-03 ENCOUNTER — Telehealth: Payer: Self-pay | Admitting: *Deleted

## 2014-02-03 ENCOUNTER — Ambulatory Visit (HOSPITAL_COMMUNITY)
Admission: RE | Admit: 2014-02-03 | Discharge: 2014-02-03 | Disposition: A | Discharge status: Home / Self Care | Payer: BC Managed Care – PPO | Source: Ambulatory Visit | Attending: Family Medicine | Admitting: Family Medicine

## 2014-02-03 VITALS — BP 150/98 | Temp 98.7°F | Ht 71.0 in | Wt 202.0 lb

## 2014-02-03 DIAGNOSIS — N509 Disorder of male genital organs, unspecified: Secondary | ICD-10-CM | POA: Insufficient documentation

## 2014-02-03 DIAGNOSIS — R109 Unspecified abdominal pain: Secondary | ICD-10-CM

## 2014-02-03 DIAGNOSIS — R1032 Left lower quadrant pain: Secondary | ICD-10-CM | POA: Insufficient documentation

## 2014-02-03 LAB — HEPATIC FUNCTION PANEL
ALT: 26 U/L (ref 0–53)
AST: 25 U/L (ref 0–37)
Albumin: 4.4 g/dL (ref 3.5–5.2)
Alkaline Phosphatase: 47 U/L (ref 39–117)
BILIRUBIN TOTAL: 0.7 mg/dL (ref 0.3–1.2)
Indirect Bilirubin: 0.7 mg/dL (ref 0.2–1.2)
Total Protein: 7.3 g/dL (ref 6.0–8.3)

## 2014-02-03 LAB — POCT URINALYSIS DIPSTICK
PH UA: 6
Spec Grav, UA: <=1.005
UROBILINOGEN UA: 2

## 2014-02-03 LAB — CBC WITH DIFFERENTIAL/PLATELET
BASOS ABS: 0 10*3/uL (ref 0.0–0.1)
Basophils Relative: 0 % (ref 0–1)
Eosinophils Absolute: 0 10*3/uL (ref 0.0–0.7)
Eosinophils Relative: 0 % (ref 0–5)
HCT: 41.3 % (ref 39.0–52.0)
Hemoglobin: 14.3 g/dL (ref 13.0–17.0)
Lymphocytes Relative: 14 % (ref 12–46)
Lymphs Abs: 1.3 10*3/uL (ref 0.7–4.0)
MCH: 30.6 pg (ref 26.0–34.0)
MCHC: 34.6 g/dL (ref 30.0–36.0)
MCV: 88.2 fL (ref 78.0–100.0)
MONO ABS: 0.6 10*3/uL (ref 0.1–1.0)
Monocytes Relative: 7 % (ref 3–12)
NEUTROS ABS: 7.1 10*3/uL (ref 1.7–7.7)
Neutrophils Relative %: 79 % — ABNORMAL HIGH (ref 43–77)
PLATELETS: 242 10*3/uL (ref 150–400)
RBC: 4.68 MIL/uL (ref 4.22–5.81)
RDW: 12.5 % (ref 11.5–15.5)
WBC: 9 10*3/uL (ref 4.0–10.5)

## 2014-02-03 LAB — BASIC METABOLIC PANEL
BUN: 29 mg/dL — ABNORMAL HIGH (ref 6–23)
CO2: 26 mEq/L (ref 19–32)
CREATININE: 1.41 mg/dL — AB (ref 0.50–1.35)
Calcium: 10.1 mg/dL (ref 8.4–10.5)
Chloride: 101 mEq/L (ref 96–112)
Glucose, Bld: 132 mg/dL — ABNORMAL HIGH (ref 70–99)
POTASSIUM: 4.3 meq/L (ref 3.5–5.3)
Sodium: 139 mEq/L (ref 135–145)

## 2014-02-03 MED ORDER — OMEPRAZOLE 20 MG PO CPDR: 20.0000 mg | DELAYED_RELEASE_CAPSULE | Freq: Every day | ORAL | Status: DC

## 2014-02-03 MED ORDER — ONDANSETRON 4 MG PO TBDP: 4.0000 mg | ORAL_TABLET | Freq: Four times a day (QID) | ORAL | Status: DC | PRN

## 2014-02-03 NOTE — Patient Instructions (Signed)
An uncommon cause to consider for this abdominal pain if it comes back would be something called ischemic colitis (not enough blood flow to a patch of the intestines). Bring this with you if you have to go to the ER

## 2014-02-03 NOTE — Telephone Encounter (Signed)
Spoke with patient and told him xray and bloodwork were good. Advised pt if symptoms returned he needs to go directly to ED. Per Dr. Brett CanalesSteve.

## 2014-02-03 NOTE — Progress Notes (Signed)
Subjective:    Patient ID: Mark [name], male    DOB: 11-Aug-1951, 63 y.o.   MRN: [id number]  Abdominal Pain This is a new problem. The current episode started today. The quality of the pain is sharp. The abdominal pain radiates to the LLQ and scrotum. Associated symptoms include vomiting.    vom times two  Bowels harder last couple daysseveral days since last few days hard,  LLQ discomfort  Urine darker  Felt a chill at times,  Pain fairly severe last night ate sandwich and tea this morn  Patient did had a spell of abdominal pain several weeks ago that lasted a few hours it was similar. And then it faded away.  Pain seems to be most focused in the left lower quadrant. With radiation to the left scrotal region. Colonoscopy five yrs ago and rec a f u around now  Results for orders placed in visit on 02/03/14  CBC WITH DIFFERENTIAL      Result Value Ref Range   WBC 9.0  4.0 - 10.5 K/uL   RBC 4.68  4.22 - 5.81 MIL/uL   Hemoglobin 14.3  13.0 - 17.0 g/dL   HCT 41.3  39.0 - 52.0 %   MCV 88.2  78.0 - 100.0 fL   MCH 30.6  26.0 - 34.0 pg   MCHC 34.6  30.0 - 36.0 g/dL   RDW 12.5  11.5 - 15.5 %   Platelets 242  150 - 400 K/uL   Neutrophils Relative % 79 (*) 43 - 77 %   Neutro Abs 7.1  1.7 - 7.7 K/uL   Lymphocytes Relative 14  12 - 46 %   Lymphs Abs 1.3  0.7 - 4.0 K/uL   Monocytes Relative 7  3 - 12 %   Monocytes Absolute 0.6  0.1 - 1.0 K/uL   Eosinophils Relative 0  0 - 5 %   Eosinophils Absolute 0.0  0.0 - 0.7 K/uL   Basophils Relative 0  0 - 1 %   Basophils Absolute 0.0  0.0 - 0.1 K/uL   Smear Review Criteria for review not met    BASIC METABOLIC PANEL      Result Value Ref Range   Sodium 139  135 - 145 mEq/L   Potassium 4.3  3.5 - 5.3 mEq/L   Chloride 101  96 - 112 mEq/L   CO2 26  19 - 32 mEq/L   Glucose, Bld 132 (*) 70 - 99 mg/dL   BUN 29 (*) 6 - 23 mg/dL   Creat 1.41 (*) 0.50 - 1.35 mg/dL   Calcium 10.1  8.4 - 10.5 mg/dL  HEPATIC FUNCTION PANEL      Result  Value Ref Range   Total Bilirubin 0.7  0.3 - 1.2 mg/dL   Bilirubin, Direct <0.1  0.0 - 0.3 mg/dL   Indirect Bilirubin 0.7  0.2 - 1.2 mg/dL   Alkaline Phosphatase 47  39 - 117 U/L   AST 25  0 - 37 U/L   ALT 26  0 - 53 U/L   Total Protein 7.3  6.0 - 8.3 g/dL   Albumin 4.4  3.5 - 5.2 g/dL  POCT URINALYSIS DIPSTICK      Result Value Ref Range   Color, UA       Clarity, UA       Glucose, UA       Bilirubin, UA +     Ketones, UA       Spec Grav,  UA <=1.005     Blood, UA       pH, UA 6.0     Protein, UA       Urobilinogen, UA 2.0     Nitrite, UA       Leukocytes, UA       An in and in an is a w Review of Systems  Gastrointestinal: Positive for vomiting and abdominal pain.   her urine. No obvious increase frequency.      Objective:   Physical Exam  alert no apparent distress patient reports pain has gone. HEENT normal. Lungs clear. Heart regular rate rhythm. Abdomen soft minimal left sided discomfort to firm palpation testicles normal  Urinalysis negative.  See above blood work        Assessment & Plan:  Impression puzzling abdominal pain. No hematuria or so kidney stone less likely. Flatplate abdominal x-ray unremarkable. Blood work and urine unremarkable. The only thing him somewhat concerned about his potential for a transient ischemic colitis presentation. This is discussed with patient now that the discomfort has resolved we will not put him through a trip to the emergency room and hospital plan Zofran when necessary for nausea. Warning signs discussed. If pain returns and substantial would recommend going to ER for further workup urgent assessment. WSL

## 2014-02-05 ENCOUNTER — Encounter: Payer: Self-pay | Admitting: Family Medicine

## 2014-02-06 ENCOUNTER — Other Ambulatory Visit: Payer: Self-pay | Admitting: *Deleted

## 2014-02-07 ENCOUNTER — Telehealth: Payer: Self-pay | Admitting: Family Medicine

## 2014-02-07 ENCOUNTER — Other Ambulatory Visit: Payer: Self-pay | Admitting: *Deleted

## 2014-02-07 MED ORDER — OMEPRAZOLE 20 MG PO CPDR: 20.0000 mg | DELAYED_RELEASE_CAPSULE | Freq: Every day | ORAL | Status: DC

## 2014-02-07 NOTE — Telephone Encounter (Signed)
Med resent to pharm. Pt notified.  

## 2014-02-07 NOTE — Telephone Encounter (Signed)
Patient said that [facility] said they do not have the omeprazole prescription that was sent over. Please resend.

## 2014-02-08 ENCOUNTER — Telehealth: Payer: Self-pay | Admitting: Family Medicine

## 2014-02-08 MED ORDER — SCOPOLAMINE 1 MG/3DAYS TD PT72: MEDICATED_PATCH | TRANSDERMAL | Status: DC

## 2014-02-08 NOTE — Telephone Encounter (Signed)
Scopolamine patch, 1 box, apply patch every 3 days start the day before the cruise. The patient should know that in some men this can reduce urine flow if urine flow, in other words make it difficult to pee, decreases dramatically it is best to us remove the patch right away.

## 2014-02-08 NOTE — Telephone Encounter (Signed)
Pt going on a 5 day cruise, requesting motion sickness patches to take with him just in case, [facility] (pt states he's never had any Rx's filled there but his wife has) They are leaving this Saturday 02/11/14

## 2014-02-08 NOTE — Telephone Encounter (Signed)
Discussed with pt's wife. [facility] [place]

## 2014-02-17 ENCOUNTER — Telehealth: Payer: Self-pay | Admitting: Family Medicine

## 2014-02-17 MED ORDER — OMEPRAZOLE 20 MG PO CPDR: 20.0000 mg | DELAYED_RELEASE_CAPSULE | Freq: Every day | ORAL | Status: DC

## 2014-02-17 NOTE — Telephone Encounter (Signed)
Medication sent patient notified. 

## 2014-02-17 NOTE — Telephone Encounter (Signed)
See chart for denial from [facility] on Pts Omeprazole

## 2014-02-17 NOTE — Telephone Encounter (Signed)
Nurses, please send in 90 day Rx to [facility]  for this patient's OMEPRAZOLE 20mg  capsules, take one by mouth daily with refills  It was [facility] by mistake   Denial letter patient brought in is dated 02/06/14, APPROVAL was obtained 02/08/14 that covers 01/18/14-02/08/2015 through [facility] case# [id number].

## 2014-03-02 ENCOUNTER — Other Ambulatory Visit: Payer: Self-pay | Admitting: Family Medicine

## 2014-03-30 ENCOUNTER — Other Ambulatory Visit: Payer: Self-pay | Admitting: Family Medicine

## 2014-04-10 ENCOUNTER — Other Ambulatory Visit: Payer: Self-pay | Admitting: Family Medicine

## 2014-05-10 ENCOUNTER — Other Ambulatory Visit: Payer: Self-pay | Admitting: Family Medicine

## 2014-05-10 NOTE — Telephone Encounter (Signed)
Last seen 3/15

## 2014-05-11 NOTE — Telephone Encounter (Signed)
May refill this +4 refills

## 2014-05-15 ENCOUNTER — Other Ambulatory Visit: Payer: Self-pay | Admitting: Family Medicine

## 2014-05-15 NOTE — Telephone Encounter (Signed)
May have this +2 refills needs followup by September

## 2014-05-16 ENCOUNTER — Other Ambulatory Visit: Payer: Self-pay | Admitting: Family Medicine

## 2014-05-16 NOTE — Telephone Encounter (Signed)
Refill times 2 

## 2014-05-23 ENCOUNTER — Telehealth: Payer: Self-pay | Admitting: Family Medicine

## 2014-05-23 DIAGNOSIS — Z79899 Other long term (current) drug therapy: Secondary | ICD-10-CM

## 2014-05-23 DIAGNOSIS — R5383 Other fatigue: Secondary | ICD-10-CM

## 2014-05-23 DIAGNOSIS — Z125 Encounter for screening for malignant neoplasm of prostate: Secondary | ICD-10-CM

## 2014-05-23 DIAGNOSIS — R5381 Other malaise: Secondary | ICD-10-CM

## 2014-05-23 DIAGNOSIS — E785 Hyperlipidemia, unspecified: Secondary | ICD-10-CM

## 2014-05-23 DIAGNOSIS — R7303 Prediabetes: Secondary | ICD-10-CM

## 2014-05-23 DIAGNOSIS — I10 Essential (primary) hypertension: Secondary | ICD-10-CM

## 2014-05-23 NOTE — Telephone Encounter (Signed)
bw orders for PE on 8/28 please call when sent

## 2014-05-23 NOTE — Telephone Encounter (Signed)
Lipid, liver, TSH, PSA, hemoglobin A1c, metabolic 7

## 2014-05-23 NOTE — Telephone Encounter (Signed)
Blood work orders placed in Epic. Patient notified. 

## 2014-05-23 NOTE — Telephone Encounter (Signed)
3/15- CBC, Met 7, Liver 8/14- PSA, HgbA1c 6/14- Lipid, TSH

## 2014-06-01 ENCOUNTER — Other Ambulatory Visit: Payer: Self-pay | Admitting: Family Medicine

## 2014-06-15 LAB — LIPID PANEL
Cholesterol: 148 mg/dL (ref 0–200)
HDL: 46 mg/dL (ref >39)
LDL Cholesterol: 63 mg/dL (ref 0–99)
Total CHOL/HDL Ratio: 3.2 Ratio
Triglycerides: 194 mg/dL — ABNORMAL HIGH (ref <150)
VLDL: 39 mg/dL (ref 0–40)

## 2014-06-15 LAB — HEPATIC FUNCTION PANEL
ALT: 22 U/L (ref 0–53)
AST: 20 U/L (ref 0–37)
Albumin: 4.4 g/dL (ref 3.5–5.2)
Alkaline Phosphatase: 37 U/L — ABNORMAL LOW (ref 39–117)
Bilirubin, Direct: 0.1 mg/dL (ref 0.0–0.3)
Indirect Bilirubin: 0.8 mg/dL (ref 0.2–1.2)
Total Bilirubin: 0.9 mg/dL (ref 0.2–1.2)
Total Protein: 6.8 g/dL (ref 6.0–8.3)

## 2014-06-15 LAB — TSH: TSH: 2.944 u[IU]/mL (ref 0.350–4.500)

## 2014-06-15 LAB — HEMOGLOBIN A1C
Hgb A1c MFr Bld: 5.7 % — ABNORMAL HIGH (ref <5.7)
Mean Plasma Glucose: 117 mg/dL — ABNORMAL HIGH (ref <117)

## 2014-06-16 LAB — PSA: PSA: 0.88 ng/mL (ref <=4.00)

## 2014-06-18 ENCOUNTER — Encounter: Payer: Self-pay | Admitting: Family Medicine

## 2014-07-04 ENCOUNTER — Ambulatory Visit (INDEPENDENT_AMBULATORY_CARE_PROVIDER_SITE_OTHER): Payer: BC Managed Care – PPO | Admitting: Family Medicine

## 2014-07-04 ENCOUNTER — Other Ambulatory Visit: Payer: Self-pay | Admitting: Family Medicine

## 2014-07-04 ENCOUNTER — Encounter: Payer: Self-pay | Admitting: Family Medicine

## 2014-07-04 VITALS — BP 132/90 | Ht 71.0 in | Wt 198.0 lb

## 2014-07-04 DIAGNOSIS — N419 Inflammatory disease of prostate, unspecified: Secondary | ICD-10-CM

## 2014-07-04 DIAGNOSIS — R300 Dysuria: Secondary | ICD-10-CM

## 2014-07-04 LAB — POCT URINALYSIS DIPSTICK: pH, UA: 7

## 2014-07-04 MED ORDER — CIPROFLOXACIN HCL 500 MG PO TABS: 500.0000 mg | ORAL_TABLET | Freq: Two times a day (BID) | ORAL | Status: DC

## 2014-07-04 NOTE — Progress Notes (Signed)
   Subjective:    Patient ID: Mark [name], male    DOB: 1951-07-28, 63 y.o.   MRN: [id number]  Urinary Tract Infection  This is a new problem. The current episode started yesterday. The problem occurs intermittently. The problem has been unchanged. The quality of the pain is described as burning. The pain is at a severity of 5/10. The pain is moderate.   Patient is states on last night it hurt for him to sit down. Pain is better today. Relates slight dysuria   Review of Systems Slight dysuria no fevers no vomiting no back pain no abdominal pain    Objective:   Physical Exam  Lungs clear heart regular flanks nontender abdomen soft prostate enlarged and tender      Assessment & Plan:  Prostatitis-antibiotics prescribed followup for wellness if high fever or worse followup warnings discuss

## 2014-07-04 NOTE — Patient Instructions (Signed)

## 2014-07-11 ENCOUNTER — Other Ambulatory Visit: Payer: Self-pay | Admitting: Family Medicine

## 2014-07-14 ENCOUNTER — Encounter: Payer: BC Managed Care – PPO | Admitting: Family Medicine

## 2014-07-18 ENCOUNTER — Ambulatory Visit (INDEPENDENT_AMBULATORY_CARE_PROVIDER_SITE_OTHER): Payer: BC Managed Care – PPO | Admitting: Family Medicine

## 2014-07-18 ENCOUNTER — Encounter: Payer: Self-pay | Admitting: Family Medicine

## 2014-07-18 VITALS — BP 130/78 | Ht 70.5 in | Wt 198.2 lb

## 2014-07-18 DIAGNOSIS — E785 Hyperlipidemia, unspecified: Secondary | ICD-10-CM

## 2014-07-18 DIAGNOSIS — I10 Essential (primary) hypertension: Secondary | ICD-10-CM

## 2014-07-18 DIAGNOSIS — Z0000 Encounter for general adult medical examination without abnormal findings: Secondary | ICD-10-CM

## 2014-07-18 MED ORDER — CIPROFLOXACIN HCL 500 MG PO TABS: 500.0000 mg | ORAL_TABLET | Freq: Two times a day (BID) | ORAL | Status: DC

## 2014-07-18 NOTE — Progress Notes (Signed)
Subjective:    Patient ID: Mark [name], male    DOB: 08/20/1951, 63 y.o.   MRN: [id number]  HPI The patient comes in today for a wellness visit.    A review of their health history was completed.  A review of medications was also completed.  Any needed refills: none  Eating habits: good  Falls/ MVA accidents in past few months: none  Regular exercise: yes  Specialist pt sees on regular basis: Dr. Casimiro Needle Copper- cardiologist   Preventative health issues were discussed.   Additional concerns: Patient states that when he sits for a long period of time his feet feels funny like they are swollen. This has been present for about 6 months now.  Patient states he denies any PND or any orthopnea.  He is concerned about erectile dysfunction he would like to try the generic of Viagra. He asked for a prescription today  He has blood pressure issues he asked that his blood pressure be taken with his cuff and our cuff to compare the date he states at times his blood pressure is slightly low but he does not feel this. On today's examination blood pressure with our cuff and the wall cuff slightly elevated but the readings he brings from home actually look good  He does have sleep apnea he takes treatment as instructed  Patient has history of heart disease. We discussed ways to help prevent this from occurring again as well as lipid profile and his results.   Review of Systems  Constitutional: Negative for fever, activity change and appetite change.  HENT: Negative for congestion and rhinorrhea.   Eyes: Negative for discharge.  Respiratory: Negative for cough and wheezing.   Cardiovascular: Negative for chest pain.  Gastrointestinal: Negative for vomiting, abdominal pain and blood in stool.  Genitourinary: Negative for frequency and difficulty urinating.  Musculoskeletal: Negative for neck pain.  Skin: Negative for rash.  Allergic/Immunologic: Negative for environmental  allergies and food allergies.  Neurological: Negative for weakness and headaches.  Psychiatric/Behavioral: Negative for agitation.       Objective:   Physical Exam  Constitutional: He appears well-developed and well-nourished.  HENT:  Head: Normocephalic and atraumatic.  Right Ear: External ear normal.  Left Ear: External ear normal.  Nose: Nose normal.  Mouth/Throat: Oropharynx is clear and moist.  Eyes: EOM are normal. Pupils are equal, round, and reactive to light.  Neck: Normal range of motion. Neck supple. No thyromegaly present.  Cardiovascular: Normal rate, regular rhythm and normal heart sounds.   No murmur heard. Pulmonary/Chest: Effort normal and breath sounds normal. No respiratory distress. He has no wheezes.  Abdominal: Soft. Bowel sounds are normal. He exhibits no distension and no mass. There is no tenderness.  Genitourinary: Penis normal.  Musculoskeletal: Normal range of motion. He exhibits no edema.  Lymphadenopathy:    He has no cervical adenopathy.  Neurological: He is alert. He exhibits normal muscle tone.  Skin: Skin is warm and dry. No erythema.  Psychiatric: He has a normal mood and affect. His behavior is normal. Judgment normal.          Assessment & Plan:  This patient was seen for wellness visit. Safety measures dietary measures all discussed review of medications was done. Review immunizations was done. He should have annual wellness every year. Patient not due for colonoscopy until 2017. He had his last one done in [facility] there is no record of this year he will try to get  that sent to [place]. We will have him sign a record release  In addition to this we also covered multiple health issues. He was concerned about swelling in the hands and feet. Concerned about his blood pressure. He was also concerned about cholesterol and heart disease. All of these issues were addressed in an additional office visit was necessary as part of his visit  today [phone]

## 2014-07-18 NOTE — Patient Instructions (Signed)
Melatonin 3 to 5 mg near bedtime  DASH Eating Plan DASH stands for "Dietary Approaches to Stop Hypertension." The DASH eating plan is a healthy eating plan that has been shown to reduce high blood pressure (hypertension). Additional health benefits may include reducing the risk of type 2 diabetes mellitus, heart disease, and stroke. The DASH eating plan may also help with weight loss. WHAT DO I NEED TO KNOW ABOUT THE DASH EATING PLAN? For the DASH eating plan, you will follow these general guidelines:  Choose foods with a percent daily value for sodium of less than 5% (as listed on the food label).  Use salt-free seasonings or herbs instead of table salt or sea salt.  Check with your health care provider or pharmacist before using salt substitutes.  Eat lower-sodium products, often labeled as "lower sodium" or "no salt added."  Eat fresh foods.  Eat more vegetables, fruits, and low-fat dairy products.  Choose whole grains. Look for the word "whole" as the first word in the ingredient list.  Choose fish and skinless chicken or [place] more often than red meat. Limit fish, poultry, and meat to 6 oz (170 g) each day.  Limit sweets, desserts, sugars, and sugary drinks.  Choose heart-healthy fats.  Limit cheese to 1 oz (28 g) per day.  Eat more home-cooked food and less restaurant, buffet, and fast food.  Limit fried foods.  Cook foods using methods other than frying.  Limit canned vegetables. If you do use them, rinse them well to decrease the sodium.  When eating at a restaurant, ask that your food be prepared with less salt, or no salt if possible. WHAT FOODS CAN I EAT? Seek help from a dietitian for individual calorie needs. Grains Whole grain or whole wheat bread. Brown rice. Whole grain or whole wheat pasta. Quinoa, bulgur, and whole grain cereals. Low-sodium cereals. Corn or whole wheat flour tortillas. Whole grain cornbread. Whole grain crackers. Low-sodium  crackers. Vegetables Fresh or frozen vegetables (raw, steamed, roasted, or grilled). Low-sodium or reduced-sodium tomato and vegetable juices. Low-sodium or reduced-sodium tomato sauce and paste. Low-sodium or reduced-sodium canned vegetables.  Fruits All fresh, canned (in natural juice), or frozen fruits. Meat and Other Protein Products Ground beef (85% or leaner), grass-fed beef, or beef trimmed of fat. Skinless chicken or [place]. Ground chicken or [place]. Pork trimmed of fat. All fish and seafood. Eggs. Dried beans, peas, or lentils. Unsalted nuts and seeds. Unsalted canned beans. Dairy Low-fat dairy products, such as skim or 1% milk, 2% or reduced-fat cheeses, low-fat ricotta or cottage cheese, or plain low-fat yogurt. Low-sodium or reduced-sodium cheeses. Fats and Oils Tub margarines without trans fats. Light or reduced-fat mayonnaise and salad dressings (reduced sodium). Avocado. Safflower, olive, or canola oils. Natural peanut or almond butter. Other Unsalted popcorn and pretzels. The items listed above may not be a complete list of recommended foods or beverages. Contact your dietitian for more options. WHAT FOODS ARE NOT RECOMMENDED? Grains White bread. White pasta. White rice. Refined cornbread. Bagels and croissants. Crackers that contain trans fat. Vegetables Creamed or fried vegetables. Vegetables in a cheese sauce. Regular canned vegetables. Regular canned tomato sauce and paste. Regular tomato and vegetable juices. Fruits Dried fruits. Canned fruit in light or heavy syrup. Fruit juice. Meat and Other Protein Products Fatty cuts of meat. Ribs, chicken wings, bacon, sausage, bologna, salami, chitterlings, fatback, hot dogs, bratwurst, and packaged luncheon meats. Salted nuts and seeds. Canned beans with salt. Dairy Whole or 2% milk, cream, half-and-half,  and cream cheese. Whole-fat or sweetened yogurt. Full-fat cheeses or blue cheese. Nondairy creamers and whipped toppings.  Processed cheese, cheese spreads, or cheese curds. Condiments Onion and garlic salt, seasoned salt, table salt, and sea salt. Canned and packaged gravies. Worcestershire sauce. Tartar sauce. Barbecue sauce. Teriyaki sauce. Soy sauce, including reduced sodium. Steak sauce. Fish sauce. Oyster sauce. Cocktail sauce. Horseradish. Ketchup and mustard. Meat flavorings and tenderizers. Bouillon cubes. Hot sauce. Tabasco sauce. Marinades. Taco seasonings. Relishes. Fats and Oils Butter, stick margarine, lard, shortening, ghee, and bacon fat. Coconut, palm kernel, or palm oils. Regular salad dressings. Other Pickles and olives. Salted popcorn and pretzels. The items listed above may not be a complete list of foods and beverages to avoid. Contact your dietitian for more information. WHERE CAN I FIND MORE INFORMATION? National Heart, Lung, and Blood Institute: travelstabloid.com Document Released: 10/23/2011 Document Revised: 03/20/2014 Document Reviewed: 09/07/2013 [facility] Patient Information 2015 [place], [place]. This information is not intended to replace advice given to you by your health care provider. Make sure you discuss any questions you have with your health care provider.

## 2014-07-25 ENCOUNTER — Other Ambulatory Visit: Payer: Self-pay | Admitting: Family Medicine

## 2014-08-01 ENCOUNTER — Other Ambulatory Visit: Payer: Self-pay | Admitting: Family Medicine

## 2014-08-26 ENCOUNTER — Encounter: Payer: Self-pay | Admitting: Family Medicine

## 2014-08-26 DIAGNOSIS — Z860100 Personal history of colon polyps, unspecified: Secondary | ICD-10-CM

## 2014-08-26 HISTORY — DX: Personal history of colon polyps, unspecified: Z86.0100

## 2014-08-26 HISTORY — DX: Personal history of colonic polyps: Z86.010

## 2014-08-30 ENCOUNTER — Other Ambulatory Visit: Payer: Self-pay | Admitting: Family Medicine

## 2014-09-01 ENCOUNTER — Encounter: Payer: Self-pay | Admitting: Internal Medicine

## 2014-09-09 ENCOUNTER — Other Ambulatory Visit: Payer: Self-pay | Admitting: Family Medicine

## 2014-09-19 ENCOUNTER — Other Ambulatory Visit: Payer: Self-pay | Admitting: Family Medicine

## 2014-10-09 ENCOUNTER — Other Ambulatory Visit: Payer: Self-pay | Admitting: Family Medicine

## 2014-11-01 LAB — LIPID PANEL
CHOLESTEROL: 126 mg/dL (ref 0–200)
HDL: 38 mg/dL — AB (ref >39)
LDL Cholesterol: 48 mg/dL (ref 0–99)
Total CHOL/HDL Ratio: 3.3 Ratio
Triglycerides: 200 mg/dL — ABNORMAL HIGH (ref <150)
VLDL: 40 mg/dL (ref 0–40)

## 2014-11-08 ENCOUNTER — Other Ambulatory Visit: Payer: Self-pay | Admitting: Family Medicine

## 2014-11-08 NOTE — Telephone Encounter (Signed)
Seen 9/1 

## 2014-11-08 NOTE — Telephone Encounter (Signed)
Ok this and 4 refills 

## 2014-11-13 ENCOUNTER — Ambulatory Visit (INDEPENDENT_AMBULATORY_CARE_PROVIDER_SITE_OTHER): Payer: BC Managed Care – PPO | Admitting: Family Medicine

## 2014-11-13 ENCOUNTER — Encounter: Payer: Self-pay | Admitting: Family Medicine

## 2014-11-13 VITALS — BP 134/88 | Ht 71.0 in | Wt 206.0 lb

## 2014-11-13 DIAGNOSIS — I10 Essential (primary) hypertension: Secondary | ICD-10-CM

## 2014-11-13 DIAGNOSIS — Z23 Encounter for immunization: Secondary | ICD-10-CM

## 2014-11-13 DIAGNOSIS — R739 Hyperglycemia, unspecified: Secondary | ICD-10-CM

## 2014-11-13 DIAGNOSIS — E785 Hyperlipidemia, unspecified: Secondary | ICD-10-CM

## 2014-11-13 LAB — POCT GLYCOSYLATED HEMOGLOBIN (HGB A1C): Hemoglobin A1C: 5.3

## 2014-11-13 NOTE — Progress Notes (Signed)
   Subjective:    Patient ID: Pryor Montesaniel V Yackley, male    DOB: 02-17-1951, 63 y.o.   MRN: [id number]  Hyperlipidemia This is a chronic problem. The current episode started more than 1 year ago. Pertinent negatives include no chest pain. Current antihyperlipidemic treatment includes exercise (lipitor). There are no compliance problems.   Brought in blood pressure readings.   Had lipid done on 10/31/14.  Left hip pain. Started 8 months to one year ago. Only hurts when sitting a certain way.  Stays in the joint Possibly for a year More pain, just with sitting Doesn't awaken him Tylenol helps  Flu vaccine today.   Review of Systems  Constitutional: Negative for activity change, appetite change and fatigue.  HENT: Negative for congestion.   Respiratory: Negative for cough.   Cardiovascular: Negative for chest pain.  Gastrointestinal: Negative for abdominal pain.  Endocrine: Negative for polydipsia and polyphagia.  Neurological: Negative for weakness.  Psychiatric/Behavioral: Negative for confusion.       Objective:   Physical Exam  Constitutional: He appears well-nourished. No distress.  Cardiovascular: Normal rate, regular rhythm and normal heart sounds.   No murmur heard. Pulmonary/Chest: Effort normal and breath sounds normal. No respiratory distress.  Musculoskeletal: He exhibits no edema.  Lymphadenopathy:    He has no cervical adenopathy.  Neurological: He is alert.  Psychiatric: His behavior is normal.  Vitals reviewed.         Assessment & Plan:  Hyperglycemia-his A1c looks great. Continue healthy eating follow-up 6 months  Blood pressure good continue current measures  Intermittent left hip pain find no evidence of nerve impingement or severe hip dysplasia if gets worse recommend x-rays of the hip and lower back  Lipid profile overall looks good recheck 6 months

## 2014-12-21 ENCOUNTER — Ambulatory Visit (INDEPENDENT_AMBULATORY_CARE_PROVIDER_SITE_OTHER): Payer: BC Managed Care – PPO | Admitting: Family Medicine

## 2014-12-21 ENCOUNTER — Encounter: Payer: Self-pay | Admitting: Family Medicine

## 2014-12-21 VITALS — BP 140/88 | Temp 98.4°F | Ht 71.0 in | Wt 204.0 lb

## 2014-12-21 DIAGNOSIS — J208 Acute bronchitis due to other specified organisms: Secondary | ICD-10-CM

## 2014-12-21 DIAGNOSIS — R062 Wheezing: Secondary | ICD-10-CM

## 2014-12-21 MED ORDER — LEVOFLOXACIN 500 MG PO TABS: 500.0000 mg | ORAL_TABLET | Freq: Every day | ORAL | Status: DC

## 2014-12-21 MED ORDER — ALBUTEROL SULFATE HFA 108 (90 BASE) MCG/ACT IN AERS: 2.0000 | INHALATION_SPRAY | Freq: Four times a day (QID) | RESPIRATORY_TRACT | Status: DC | PRN

## 2014-12-21 NOTE — Progress Notes (Signed)
   Subjective:    Patient ID: Mark [name], male    DOB: 29-Nov-1950, 64 y.o.   MRN: [id number]  URI  This is a new problem. The current episode started 1 to 4 weeks ago. The problem has been unchanged. There has been no fever. Associated symptoms include congestion, coughing and wheezing. He has tried decongestant (cough medication) for the symptoms. The treatment provided no relief.   patient states he try to give it some time but over the past 2 weeks it's got worse denies high fever chills sweats Patient states that he has no other concerns at this time.    Review of Systems  HENT: Positive for congestion.   Respiratory: Positive for cough and wheezing.    denies fever chills denies shortness of breath.     Objective:   Physical Exam Eardrums normal throat is normal neck supple bilateral expiratory wheezes worse on the right than the left not rest or distress low bit of chest congestion noted. Extremities no edema       Assessment & Plan:  Viral URI Doubt sinusitis Progressive bronchitis conservative measures have not cleared up over the past 2 weeks with increased congestion on the right side at do recommend antibiotics Albuterol when necessary for wheezing

## 2015-01-10 ENCOUNTER — Other Ambulatory Visit: Payer: Self-pay | Admitting: Family Medicine

## 2015-01-19 ENCOUNTER — Ambulatory Visit (INDEPENDENT_AMBULATORY_CARE_PROVIDER_SITE_OTHER): Payer: Medicare PPO | Admitting: Family Medicine

## 2015-01-19 ENCOUNTER — Encounter: Payer: Self-pay | Admitting: Family Medicine

## 2015-01-19 VITALS — BP 128/82 | Temp 98.7°F | Ht 71.0 in | Wt 202.4 lb

## 2015-01-19 DIAGNOSIS — J329 Chronic sinusitis, unspecified: Secondary | ICD-10-CM | POA: Diagnosis not present

## 2015-01-19 DIAGNOSIS — J310 Chronic rhinitis: Secondary | ICD-10-CM

## 2015-01-19 MED ORDER — AMOXICILLIN-POT CLAVULANATE 875-125 MG PO TABS: 1.0000 | ORAL_TABLET | Freq: Two times a day (BID) | ORAL | Status: DC

## 2015-01-19 NOTE — Progress Notes (Signed)
   Subjective:    Patient ID: Mark [name], male    DOB: 27-Feb-1951, 64 y.o.   MRN: [id number]  Sinusitis This is a new problem. The current episode started in the past 7 days. The problem is unchanged. There has been no fever. The pain is mild. Associated symptoms include congestion, coughing and neck pain. Past treatments include oral decongestants. The treatment provided no relief.   Patient states that he has a rash on his arms that has been present for a while now.   Nasal disch with blood  Mild cough Review of Systems  HENT: Positive for congestion.   Respiratory: Positive for cough.   Musculoskeletal: Positive for neck pain.       Objective:   Physical Exam  Alert moderate malaise. Frontal maxillary tenderness. Pharynx normal neck supple. Lungs clear. Heart regular rate and rhythm.      Assessment & Plan:  Impression acute rhinosinusitis plan antibiotics prescribed. Symptomatic care discussed. Warning signs discussed. WSL

## 2015-02-04 ENCOUNTER — Other Ambulatory Visit: Payer: Self-pay | Admitting: Family Medicine

## 2015-03-11 ENCOUNTER — Other Ambulatory Visit: Payer: Self-pay | Admitting: Family Medicine

## 2015-03-15 ENCOUNTER — Telehealth: Payer: Self-pay | Admitting: Family Medicine

## 2015-03-15 DIAGNOSIS — E785 Hyperlipidemia, unspecified: Secondary | ICD-10-CM

## 2015-03-15 DIAGNOSIS — E039 Hypothyroidism, unspecified: Secondary | ICD-10-CM

## 2015-03-15 DIAGNOSIS — Z79899 Other long term (current) drug therapy: Secondary | ICD-10-CM

## 2015-03-15 NOTE — Telephone Encounter (Signed)
Patient had Lipid, liver, tsh, psa and hgba1c 05/2014

## 2015-03-15 NOTE — Telephone Encounter (Signed)
Lipid, liver, metabolic 7, TSH-hyperlipidemia, hypertension, hypothyroidism

## 2015-03-15 NOTE — Telephone Encounter (Signed)
Blood work orders placed in Epic. Patient notified. 

## 2015-03-15 NOTE — Telephone Encounter (Signed)
Patient has an appointment on 04-25-15 and is requesting BW for this visit.

## 2015-03-31 LAB — LIPID PANEL
CHOL/HDL RATIO: 4.4 ratio (ref 0.0–5.0)
Cholesterol, Total: 179 mg/dL (ref 100–199)
HDL: 41 mg/dL (ref >39)
LDL Calculated: 83 mg/dL (ref 0–99)
Triglycerides: 276 mg/dL — ABNORMAL HIGH (ref 0–149)
VLDL Cholesterol Cal: 55 mg/dL — ABNORMAL HIGH (ref 5–40)

## 2015-03-31 LAB — TSH: TSH: 3.4 u[IU]/mL (ref 0.450–4.500)

## 2015-03-31 LAB — HEPATIC FUNCTION PANEL
ALT: 24 IU/L (ref 0–44)
AST: 19 IU/L (ref 0–40)
Albumin: 4.4 g/dL (ref 3.6–4.8)
Alkaline Phosphatase: 61 IU/L (ref 39–117)
Bilirubin Total: 1.6 mg/dL — ABNORMAL HIGH (ref 0.0–1.2)
Bilirubin, Direct: 0.27 mg/dL (ref 0.00–0.40)
TOTAL PROTEIN: 6.6 g/dL (ref 6.0–8.5)

## 2015-03-31 LAB — BASIC METABOLIC PANEL
BUN/Creatinine Ratio: 21 (ref 10–22)
BUN: 20 mg/dL (ref 8–27)
CO2: 22 mmol/L (ref 18–29)
Calcium: 9.4 mg/dL (ref 8.6–10.2)
Chloride: 100 mmol/L (ref 97–108)
Creatinine, Ser: 0.95 mg/dL (ref 0.76–1.27)
GFR calc Af Amer: 97 mL/min/{1.73_m2} (ref >59)
GFR calc non Af Amer: 84 mL/min/{1.73_m2} (ref >59)
Glucose: 111 mg/dL — ABNORMAL HIGH (ref 65–99)
POTASSIUM: 4.4 mmol/L (ref 3.5–5.2)
Sodium: 140 mmol/L (ref 134–144)

## 2015-04-09 ENCOUNTER — Other Ambulatory Visit: Payer: Self-pay | Admitting: Family Medicine

## 2015-04-23 ENCOUNTER — Telehealth: Payer: Self-pay | Admitting: Family Medicine

## 2015-04-23 ENCOUNTER — Other Ambulatory Visit: Payer: Self-pay | Admitting: *Deleted

## 2015-04-23 MED ORDER — LEVOTHYROXINE SODIUM 100 MCG PO TABS: 100.0000 ug | ORAL_TABLET | Freq: Every day | ORAL | Status: DC

## 2015-04-23 MED ORDER — OMEPRAZOLE 20 MG PO CPDR: 20.0000 mg | DELAYED_RELEASE_CAPSULE | Freq: Every day | ORAL | Status: DC

## 2015-04-23 NOTE — Telephone Encounter (Signed)
Patient needs Rx for synthroid 100 mcg tab. and omeprazole 20 mg caps.  He switched pharmacies.   [facility]

## 2015-04-23 NOTE — Telephone Encounter (Signed)
Med sent to pharm. Pt notified.  

## 2015-04-25 ENCOUNTER — Ambulatory Visit: Payer: Medicare PPO | Admitting: Family Medicine

## 2015-05-03 ENCOUNTER — Other Ambulatory Visit: Payer: Self-pay | Admitting: Family Medicine

## 2015-05-22 ENCOUNTER — Telehealth: Payer: Self-pay | Admitting: Family Medicine

## 2015-05-22 ENCOUNTER — Telehealth: Payer: Self-pay | Admitting: Cardiovascular Disease

## 2015-05-22 DIAGNOSIS — E039 Hypothyroidism, unspecified: Secondary | ICD-10-CM

## 2015-05-22 DIAGNOSIS — Z125 Encounter for screening for malignant neoplasm of prostate: Secondary | ICD-10-CM

## 2015-05-22 DIAGNOSIS — R5383 Other fatigue: Secondary | ICD-10-CM

## 2015-05-22 DIAGNOSIS — Z79899 Other long term (current) drug therapy: Secondary | ICD-10-CM

## 2015-05-22 DIAGNOSIS — E785 Hyperlipidemia, unspecified: Secondary | ICD-10-CM

## 2015-05-22 DIAGNOSIS — R739 Hyperglycemia, unspecified: Secondary | ICD-10-CM

## 2015-05-22 NOTE — Telephone Encounter (Signed)
Pt also needs labs for Phy in Aug, has new insurance so going earlier on his phy  Last labs Lip, hep, bmp, TSH  03/30/15

## 2015-05-22 NOTE — Telephone Encounter (Signed)
New Message    7/5@10 :12am pt calling in to schedule appt he has been having chest pain,   He also wants a stress test done if Dr. Excell Seltzerooper approves    Pt is not having any current chest pain   Pt stated that he only wants to see Dr. Excell Seltzerooper when I gave the option of seeing a PA for a sooner appt.

## 2015-05-22 NOTE — Telephone Encounter (Signed)
Pt is needing his metoprolol refilled and [facility] and pt will need 90 day supplies sent in.

## 2015-05-22 NOTE — Telephone Encounter (Signed)
Lipid, liver, PSA, metabolic 7, hemoglobin A1c, TSH, CBC

## 2015-05-22 NOTE — Telephone Encounter (Signed)
I spoke with the pt and he complains of chest discomfort that comes and goes for the past 2-3 weeks.  The pt mainly experiences this at rest and denies SOB, dizziness and nausea.  The pt describes the discomfort as "down in esophagus and spreads across chest".  The pt also complains of throat pain which is similar to what he felt prior to CABG. The pt has been walking a few miles a day for exercise and has done fine with this until today.  The pt walked a half-mile this morning,developed symptoms and went back home.  Symptoms resolved with rest.  The pt feels like some of his symptoms are related to indigestion.  The pt has eaten onions, hot dogs and hamburgers over the weekend. The pt takes prilosec daily and I advised him that he can take TUMS if needed. I scheduled the pt to see Dr Excell Seltzerooper tomorrow and also advised that he proceed to the ER if he has any further episodes and not wait until this appointment.  Pt agreed with plan.

## 2015-05-23 ENCOUNTER — Ambulatory Visit (INDEPENDENT_AMBULATORY_CARE_PROVIDER_SITE_OTHER): Payer: Medicare PPO | Admitting: Cardiovascular Disease

## 2015-05-23 ENCOUNTER — Encounter: Payer: Self-pay | Admitting: Cardiovascular Disease

## 2015-05-23 VITALS — BP 118/82 | HR 67 | Ht 71.0 in | Wt 200.0 lb

## 2015-05-23 DIAGNOSIS — I10 Essential (primary) hypertension: Secondary | ICD-10-CM | POA: Diagnosis not present

## 2015-05-23 DIAGNOSIS — I25119 Atherosclerotic heart disease of native coronary artery with unspecified angina pectoris: Secondary | ICD-10-CM | POA: Diagnosis not present

## 2015-05-23 MED ORDER — METOPROLOL TARTRATE 25 MG PO TABS: ORAL_TABLET | ORAL | Status: DC

## 2015-05-23 NOTE — Progress Notes (Signed)
Cardiology Office Note   Date:  05/23/2015   ID:  Mark [name], DOB [birth year], MRN [id number]  PCP:  Lilyan PuntScott Luking, MD  Cardiologist:  Tonny Bollmanooper, Ambreen Tufte, MD    Chief Complaint  Patient presents with  . Chest Pain    History of Present Illness: Mark [name] is a 64 y.o. male who presents for follow-up of CAD and evaluation of chest pain.  The patient has coronary artery disease status post single-vessel CABG with LIMA to LAD. His last heart catheterization was in 2012 when his mammary artery was shown to be widely patent to the mid LAD. He had minor nonobstructive disease the left circumflex and right coronary arteries with normal LV function.  He has been experiencing dull chest discomfort over the past few months. At times the pain radiates to the throat. He is able to walk without exertional symptoms - walked 2 miles this morning without problems). No associated shortness of breath, nausea, or diaphoresis. Has reflux and has been compliant with prilosec.    Past Medical History  Diagnosis Date  . Coronary artery disease   . Hyperlipidemia   . Hypothyroidism   . GERD (gastroesophageal reflux disease)   . Esophageal stricture   . Gastrointestinal bleeding   . Dyslipidemia     Past Surgical History  Procedure Laterality Date  . Bilateral inguinal hernia repair    . Appendectomy    . Coronary artery bypass graft  2006    LIMA to LAD, off pump  . Cardiac catheterization  12/2005, 06/2005  . Colonoscopy      Current Outpatient Prescriptions  Medication Sig Dispense Refill  . ALPRAZolam (XANAX) 0.25 MG tablet TAKE 1 TABLET BY MOUTH AT BEDTIME AS NEEDED 30 tablet 4  . aspirin 81 MG tablet 81 mg daily.     Marland Kitchen. atorvastatin (LIPITOR) 80 MG tablet TAKE 1 TABLET BY MOUTH EVERY DAY 30 tablet 5  . levothyroxine (SYNTHROID, LEVOTHROID) 100 MCG tablet Take 1 tablet (100 mcg total) by mouth daily. 90 tablet 0  . losartan (COZAAR) 50 MG tablet TAKE ONE TABLET BY MOUTH EVERY DAY  30 tablet 5  . metoprolol tartrate (LOPRESSOR) 25 MG tablet TAKE ONE-QUARTER (1/4) TABLET TWICE A DAY 45 tablet 0  . naproxen (NAPROSYN) 500 MG tablet Take 500 mg by mouth 2 (two) times daily with a meal.    . omeprazole (PRILOSEC) 20 MG capsule Take 1 capsule (20 mg total) by mouth daily. 90 capsule 0  . [DISCONTINUED] pravastatin (PRAVACHOL) 80 MG tablet Take 80 mg by mouth daily.       No current facility-administered medications for this visit.    Allergies:   Meloxicam   Social History:  The patient  reports that he quit smoking about 35 years ago. He does not have any smokeless tobacco history on file. He reports that he drinks alcohol. He reports that he does not use illicit drugs.   Family History:  The patient's family history includes Heart disease in his father; Hypertension in his father.    ROS:  Please see the history of present illness.  Otherwise, review of systems is positive for chest pain, snoring, wheezing.  All other systems are reviewed and negative.   PHYSICAL EXAM: VS:  BP 118/82 mmHg  Pulse 67  Ht 5\' 11"  (1.803 m)  Wt 200 lb (90.719 kg)  BMI 27.91 kg/m2 , BMI Body mass index is 27.91 kg/(m^2). GEN: Well nourished, well developed, in no acute distress HEENT: normal  Neck: no JVD, no masses. No carotid bruits Cardiac: RRR without murmur or gallop                Respiratory:  clear to auscultation bilaterally, normal work of breathing GI: soft, nontender, nondistended, + BS MS: no deformity or atrophy Ext: no pretibial edema, pedal pulses 2+= bilaterally Skin: warm and dry, no rash Neuro:  Strength and sensation are intact Psych: euthymic mood, full affect  EKG:  EKG is ordered today. The ekg ordered today shows NSR 67 bpm, within normal limits  Recent Labs: 03/30/2015: ALT 24; BUN 20; Creatinine, Ser 0.95; Potassium 4.4; Sodium 140; TSH 3.400   Lipid Panel     Component Value Date/Time   CHOL 179 03/30/2015 0844   CHOL 126 10/31/2014 0919   TRIG  276* 03/30/2015 0844   HDL 41 03/30/2015 0844   HDL 38* 10/31/2014 0919   CHOLHDL 4.4 03/30/2015 0844   CHOLHDL 3.3 10/31/2014 0919   VLDL 40 10/31/2014 0919   LDLCALC 83 03/30/2015 0844   LDLCALC 48 10/31/2014 0919     Wt Readings from Last 3 Encounters:  05/23/15 200 lb (90.719 kg)  01/19/15 202 lb 6 oz (91.797 kg)  12/21/14 204 lb (92.534 kg)    Cardiac Studies Reviewed: Cardiac cath 08/29/2011: ANGIOGRAPHIC FINDINGS: 1. The left main coronary artery had a proximal 40% stenosis. 2. The left anterior descending artery had a 100% ostial occlusion.  The proximal mid and distal vessel filled from the left internal  mammary artery graft. There is also a diagonal branch that fills  in a retrograde fashion from the graft. 3. The circumflex artery gave off an early large caliber obtuse  marginal branch which has mild plaque disease. The second obtuse  marginal branch is small in caliber with no disease. The AV groove  circumflex is very small in caliber beyond the second obtuse  marginal branch. 4. The right coronary artery is a large dominant vessel with mild 20%  plaque in the proximal and midportion. The posterior descending  artery and posterolateral branches are large caliber vessels with  mild plaque disease. 5. The left internal mammary artery graft to the mid LAD is patent  with no disease. 6. Left ventricular angiogram was performed in the RAO projection and  showed normal left ventricular systolic function with ejection  fraction of 60%.  IMPRESSION: 1. Single-vessel coronary artery disease with patent left internal  mammary artery graft to the mid LAD. 2. Mild nonobstructive disease in the circumflex artery and right  coronary artery. 3. Normal left ventricular systolic function.  ASSESSMENT AND PLAN: 1.   CAD, native vessel, with atypical angina. He does have some typical features but his symptoms have not been  related to physical exertion. I reviewed his last cardiac catheterization study from 2012. I think it is reasonable to proceed with an exercise Myoview stress scan to rule out ischemia as a cause of his chest pain.   He will continue on his current medical program.  2. Hyperlipidemia: Most recent lipids were reviewed. His cholesterol numbers are at a good range, but triglycerides are elevated. Lifestyle modification recommended.   Current medicines are reviewed with the patient today.  The patient does not have concerns regarding medicines.  Labs/ tests ordered today include:   Orders Placed This Encounter  Procedures  . Myocardial Perfusion Imaging  . EKG 12-Lead    Disposition:   FU one year unless stress test is abnormal  Signed, Tonny Bollman, MD  05/23/2015 5:25 PM    [facility] [address] [employer], [place], [place]  [address] Phone: [phone]; Fax: [phone]

## 2015-05-23 NOTE — Patient Instructions (Signed)
Medication Instructions:  Your physician recommends that you continue on your current medications as directed. Please refer to the Current Medication list given to you today.  Labwork: No new orders.  Testing/Procedures: Your physician has requested that you have an exercise stress myoview. For further information please visit [email or web address]. Please follow instruction sheet, as given.  Follow-Up: Your physician wants you to follow-up in: 1 YEAR with Dr Cooper.  You will receive a reminder letter in the mail two months in advance. If you don't receive a letter, please call our office to schedule the follow-up appointment.  Any Other Special Instructions Will Be Listed Below (If Applicable).   

## 2015-05-23 NOTE — Telephone Encounter (Signed)
Notified patient that blood work has been ordered and refill sent to pharmacy.

## 2015-05-31 ENCOUNTER — Telehealth (HOSPITAL_COMMUNITY): Payer: Self-pay

## 2015-05-31 NOTE — Telephone Encounter (Signed)
Left message on voicemail in reference to upcoming appointment scheduled for 06-05-2015. Phone number given for a call back so details instructions can be given. Mark [name] A   

## 2015-06-04 ENCOUNTER — Encounter: Payer: Self-pay | Admitting: Cardiovascular Disease

## 2015-06-04 ENCOUNTER — Encounter: Payer: Self-pay | Admitting: Cardiology

## 2015-06-04 ENCOUNTER — Telehealth (HOSPITAL_COMMUNITY): Payer: Self-pay

## 2015-06-04 ENCOUNTER — Telehealth (HOSPITAL_COMMUNITY): Payer: Self-pay | Admitting: *Deleted

## 2015-06-04 ENCOUNTER — Encounter: Payer: Self-pay | Admitting: *Deleted

## 2015-06-04 NOTE — Telephone Encounter (Signed)
Patient given detailed instructions per Myocardial Perfusion Study Information Sheet for test on 06/05/15 at 0730. Patient Notified to arrive 15 minutes early, and that it is imperative to arrive on time for appointment to keep from having the test rescheduled. Patient verbalized understanding. Courteney Alderete, Adelene IdlerCynthia W

## 2015-06-04 NOTE — Telephone Encounter (Signed)
Left message in reference to upcoming appointment scheduled for 06-05-2015. Phone number given for a call back so details instructions can be given. Randa EvensEdwards, Raphaella Larkin A

## 2015-06-05 ENCOUNTER — Ambulatory Visit (HOSPITAL_COMMUNITY): Payer: Medicare PPO | Attending: Cardiology

## 2015-06-05 DIAGNOSIS — I10 Essential (primary) hypertension: Secondary | ICD-10-CM

## 2015-06-05 DIAGNOSIS — I25119 Atherosclerotic heart disease of native coronary artery with unspecified angina pectoris: Secondary | ICD-10-CM | POA: Diagnosis not present

## 2015-06-05 DIAGNOSIS — R0609 Other forms of dyspnea: Secondary | ICD-10-CM | POA: Diagnosis not present

## 2015-06-05 DIAGNOSIS — Z951 Presence of aortocoronary bypass graft: Secondary | ICD-10-CM | POA: Diagnosis not present

## 2015-06-05 LAB — MYOCARDIAL PERFUSION IMAGING
CHL CUP NUCLEAR SRS: 5
CHL CUP NUCLEAR SSS: 6
CHL RATE OF PERCEIVED EXERTION: 18
CSEPEW: 11.7 METS
CSEPHR: 94 %
CSEPPHR: 148 {beats}/min
Exercise duration (min): 10 min
Exercise duration (sec): 0 s
LVDIAVOL: 100 mL
LVSYSVOL: 39 mL
MPHR: 156 {beats}/min
RATE: 0.27
Rest HR: 45 {beats}/min
SDS: 1
TID: 0.9

## 2015-06-05 MED ORDER — TECHNETIUM TC 99M SESTAMIBI GENERIC - CARDIOLITE
31.1000 | Freq: Once | INTRAVENOUS | Status: AC | PRN
  Administered 2015-06-05: 31.1 via INTRAVENOUS

## 2015-06-05 MED ORDER — TECHNETIUM TC 99M SESTAMIBI GENERIC - CARDIOLITE
10.3000 | Freq: Once | INTRAVENOUS | Status: AC | PRN
  Administered 2015-06-05: 10 via INTRAVENOUS

## 2015-06-13 LAB — CBC WITH DIFFERENTIAL/PLATELET
BASOS ABS: 0 10*3/uL (ref 0.0–0.2)
BASOS: 1 %
EOS (ABSOLUTE): 0.2 10*3/uL (ref 0.0–0.4)
Eos: 6 %
HEMATOCRIT: 42.9 % (ref 37.5–51.0)
Hemoglobin: 14.7 g/dL (ref 12.6–17.7)
Immature Grans (Abs): 0 10*3/uL (ref 0.0–0.1)
Immature Granulocytes: 0 %
LYMPHS ABS: 1.6 10*3/uL (ref 0.7–3.1)
Lymphs: 39 %
MCH: 31 pg (ref 26.6–33.0)
MCHC: 34.3 g/dL (ref 31.5–35.7)
MCV: 91 fL (ref 79–97)
Monocytes Absolute: 0.3 10*3/uL (ref 0.1–0.9)
Monocytes: 8 %
NEUTROS PCT: 46 %
Neutrophils Absolute: 1.9 10*3/uL (ref 1.4–7.0)
PLATELETS: 171 10*3/uL (ref 150–379)
RBC: 4.74 x10E6/uL (ref 4.14–5.80)
RDW: 13.3 % (ref 12.3–15.4)
WBC: 4.1 10*3/uL (ref 3.4–10.8)

## 2015-06-13 LAB — LIPID PANEL
CHOLESTEROL TOTAL: 141 mg/dL (ref 100–199)
Chol/HDL Ratio: 4.7 ratio units (ref 0.0–5.0)
HDL: 30 mg/dL — ABNORMAL LOW (ref >39)
LDL Calculated: 46 mg/dL (ref 0–99)
TRIGLYCERIDES: 326 mg/dL — AB (ref 0–149)
VLDL CHOLESTEROL CAL: 65 mg/dL — AB (ref 5–40)

## 2015-06-13 LAB — HEPATIC FUNCTION PANEL
ALK PHOS: 57 IU/L (ref 39–117)
ALT: 19 IU/L (ref 0–44)
AST: 16 IU/L (ref 0–40)
Albumin: 4.2 g/dL (ref 3.6–4.8)
Bilirubin Total: 0.9 mg/dL (ref 0.0–1.2)
Bilirubin, Direct: 0.18 mg/dL (ref 0.00–0.40)
Total Protein: 6.5 g/dL (ref 6.0–8.5)

## 2015-06-13 LAB — TSH: TSH: 2 u[IU]/mL (ref 0.450–4.500)

## 2015-06-13 LAB — HEMOGLOBIN A1C
Est. average glucose Bld gHb Est-mCnc: 111 mg/dL
Hgb A1c MFr Bld: 5.5 % (ref 4.8–5.6)

## 2015-06-13 LAB — BASIC METABOLIC PANEL
BUN/Creatinine Ratio: 20 (ref 10–22)
BUN: 18 mg/dL (ref 8–27)
CO2: 20 mmol/L (ref 18–29)
Calcium: 9.1 mg/dL (ref 8.6–10.2)
Chloride: 102 mmol/L (ref 97–108)
Creatinine, Ser: 0.92 mg/dL (ref 0.76–1.27)
GFR calc non Af Amer: 88 mL/min/{1.73_m2} (ref >59)
GFR, EST AFRICAN AMERICAN: 101 mL/min/{1.73_m2} (ref >59)
GLUCOSE: 114 mg/dL — AB (ref 65–99)
Potassium: 4.3 mmol/L (ref 3.5–5.2)
Sodium: 138 mmol/L (ref 134–144)

## 2015-06-13 LAB — PSA: PROSTATE SPECIFIC AG, SERUM: 1.1 ng/mL (ref 0.0–4.0)

## 2015-06-16 ENCOUNTER — Other Ambulatory Visit: Payer: Self-pay | Admitting: Family Medicine

## 2015-06-26 ENCOUNTER — Other Ambulatory Visit: Payer: Self-pay | Admitting: Family Medicine

## 2015-06-26 NOTE — Telephone Encounter (Signed)
May have this and 5 rf 

## 2015-06-27 ENCOUNTER — Encounter: Payer: Self-pay | Admitting: Family Medicine

## 2015-06-27 ENCOUNTER — Ambulatory Visit (INDEPENDENT_AMBULATORY_CARE_PROVIDER_SITE_OTHER): Payer: Medicare PPO | Admitting: Family Medicine

## 2015-06-27 VITALS — BP 136/86 | Ht 71.0 in | Wt 198.6 lb

## 2015-06-27 DIAGNOSIS — I10 Essential (primary) hypertension: Secondary | ICD-10-CM | POA: Diagnosis not present

## 2015-06-27 DIAGNOSIS — E785 Hyperlipidemia, unspecified: Secondary | ICD-10-CM | POA: Diagnosis not present

## 2015-06-27 DIAGNOSIS — Z0000 Encounter for general adult medical examination without abnormal findings: Secondary | ICD-10-CM

## 2015-06-27 DIAGNOSIS — N528 Other male erectile dysfunction: Secondary | ICD-10-CM | POA: Diagnosis not present

## 2015-06-27 DIAGNOSIS — E038 Other specified hypothyroidism: Secondary | ICD-10-CM | POA: Diagnosis not present

## 2015-06-27 MED ORDER — OMEPRAZOLE 20 MG PO CPDR: 20.0000 mg | DELAYED_RELEASE_CAPSULE | Freq: Every day | ORAL | Status: DC

## 2015-06-27 MED ORDER — LOSARTAN POTASSIUM 50 MG PO TABS: 50.0000 mg | ORAL_TABLET | Freq: Every day | ORAL | Status: DC

## 2015-06-27 MED ORDER — LEVOTHYROXINE SODIUM 100 MCG PO TABS: 100.0000 ug | ORAL_TABLET | Freq: Every day | ORAL | Status: DC

## 2015-06-27 MED ORDER — ATORVASTATIN CALCIUM 80 MG PO TABS: 80.0000 mg | ORAL_TABLET | Freq: Every day | ORAL | Status: DC

## 2015-06-27 MED ORDER — SILDENAFIL CITRATE 100 MG PO TABS
50.0000 mg | ORAL_TABLET | Freq: Every day | ORAL | Status: DC | PRN
Start: 2015-06-27 — End: 2016-08-27

## 2015-06-27 MED ORDER — METOPROLOL TARTRATE 25 MG PO TABS: ORAL_TABLET | ORAL | Status: DC
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
## 2015-07-10 ENCOUNTER — Other Ambulatory Visit: Payer: Self-pay | Admitting: Family Medicine

## 2015-07-16 ENCOUNTER — Other Ambulatory Visit: Payer: Self-pay | Admitting: Family Medicine

## 2015-07-19 DIAGNOSIS — B070 Plantar wart: Secondary | ICD-10-CM | POA: Diagnosis not present

## 2015-07-19 DIAGNOSIS — L821 Other seborrheic keratosis: Secondary | ICD-10-CM | POA: Diagnosis not present

## 2015-07-19 DIAGNOSIS — L80 Vitiligo: Secondary | ICD-10-CM | POA: Diagnosis not present

## 2015-07-19 DIAGNOSIS — Z85828 Personal history of other malignant neoplasm of skin: Secondary | ICD-10-CM | POA: Diagnosis not present

## 2015-07-19 DIAGNOSIS — L253 Unspecified contact dermatitis due to other chemical products: Secondary | ICD-10-CM | POA: Diagnosis not present

## 2015-07-19 DIAGNOSIS — L570 Actinic keratosis: Secondary | ICD-10-CM | POA: Diagnosis not present

## 2015-07-21 ENCOUNTER — Emergency Department (HOSPITAL_COMMUNITY): Payer: Medicare PPO

## 2015-07-21 ENCOUNTER — Emergency Department (HOSPITAL_COMMUNITY): Payer: Medicare PPO | Admitting: Anesthesiology

## 2015-07-21 ENCOUNTER — Encounter (HOSPITAL_COMMUNITY)
Admission: EM | Disposition: A | Discharge status: Home / Self Care | Payer: Self-pay | Source: Home / Self Care | Attending: Emergency Medicine

## 2015-07-21 ENCOUNTER — Ambulatory Visit (HOSPITAL_COMMUNITY)
Admission: EM | Admit: 2015-07-21 | Discharge: 2015-07-22 | Disposition: A | Discharge status: Home / Self Care | Payer: Medicare PPO | Attending: Emergency Medicine | Admitting: Emergency Medicine

## 2015-07-21 ENCOUNTER — Encounter (HOSPITAL_COMMUNITY): Payer: Self-pay | Admitting: Emergency Medicine

## 2015-07-21 DIAGNOSIS — S8011XA Contusion of right lower leg, initial encounter: Secondary | ICD-10-CM | POA: Diagnosis not present

## 2015-07-21 DIAGNOSIS — I2510 Atherosclerotic heart disease of native coronary artery without angina pectoris: Secondary | ICD-10-CM | POA: Insufficient documentation

## 2015-07-21 DIAGNOSIS — S61213A Laceration without foreign body of left middle finger without damage to nail, initial encounter: Secondary | ICD-10-CM | POA: Insufficient documentation

## 2015-07-21 DIAGNOSIS — Z7982 Long term (current) use of aspirin: Secondary | ICD-10-CM | POA: Insufficient documentation

## 2015-07-21 DIAGNOSIS — S61219A Laceration without foreign body of unspecified finger without damage to nail, initial encounter: Secondary | ICD-10-CM

## 2015-07-21 DIAGNOSIS — Z951 Presence of aortocoronary bypass graft: Secondary | ICD-10-CM | POA: Insufficient documentation

## 2015-07-21 DIAGNOSIS — S62631B Displaced fracture of distal phalanx of left index finger, initial encounter for open fracture: Secondary | ICD-10-CM | POA: Insufficient documentation

## 2015-07-21 DIAGNOSIS — S62609B Fracture of unspecified phalanx of unspecified finger, initial encounter for open fracture: Secondary | ICD-10-CM | POA: Diagnosis present

## 2015-07-21 DIAGNOSIS — E785 Hyperlipidemia, unspecified: Secondary | ICD-10-CM | POA: Diagnosis not present

## 2015-07-21 DIAGNOSIS — Z79899 Other long term (current) drug therapy: Secondary | ICD-10-CM | POA: Insufficient documentation

## 2015-07-21 DIAGNOSIS — S61311A Laceration without foreign body of left index finger with damage to nail, initial encounter: Secondary | ICD-10-CM | POA: Insufficient documentation

## 2015-07-21 DIAGNOSIS — S62631A Displaced fracture of distal phalanx of left index finger, initial encounter for closed fracture: Secondary | ICD-10-CM | POA: Diagnosis not present

## 2015-07-21 DIAGNOSIS — E039 Hypothyroidism, unspecified: Secondary | ICD-10-CM | POA: Diagnosis not present

## 2015-07-21 DIAGNOSIS — Z87891 Personal history of nicotine dependence: Secondary | ICD-10-CM | POA: Diagnosis not present

## 2015-07-21 DIAGNOSIS — S61313A Laceration without foreign body of left middle finger with damage to nail, initial encounter: Secondary | ICD-10-CM | POA: Diagnosis not present

## 2015-07-21 HISTORY — PX: OPEN REDUCTION INTERNAL FIXATION (ORIF) HAND: SHX5991

## 2015-07-21 LAB — CBC WITH DIFFERENTIAL/PLATELET
BASOS ABS: 0 10*3/uL (ref 0.0–0.1)
Basophils Relative: 1 % (ref 0–1)
EOS PCT: 2 % (ref 0–5)
Eosinophils Absolute: 0.1 10*3/uL (ref 0.0–0.7)
HEMATOCRIT: 43.4 % (ref 39.0–52.0)
HEMOGLOBIN: 14.7 g/dL (ref 13.0–17.0)
LYMPHS ABS: 1.8 10*3/uL (ref 0.7–4.0)
LYMPHS PCT: 30 % (ref 12–46)
MCH: 30.6 pg (ref 26.0–34.0)
MCHC: 33.9 g/dL (ref 30.0–36.0)
MCV: 90.2 fL (ref 78.0–100.0)
Monocytes Absolute: 0.4 10*3/uL (ref 0.1–1.0)
Monocytes Relative: 6 % (ref 3–12)
NEUTROS ABS: 3.6 10*3/uL (ref 1.7–7.7)
NEUTROS PCT: 61 % (ref 43–77)
PLATELETS: 181 10*3/uL (ref 150–400)
RBC: 4.81 MIL/uL (ref 4.22–5.81)
RDW: 12.6 % (ref 11.5–15.5)
WBC: 5.9 10*3/uL (ref 4.0–10.5)

## 2015-07-21 LAB — BASIC METABOLIC PANEL
ANION GAP: 8 (ref 5–15)
BUN: 10 mg/dL (ref 6–20)
CHLORIDE: 104 mmol/L (ref 101–111)
CO2: 24 mmol/L (ref 22–32)
Calcium: 9.6 mg/dL (ref 8.9–10.3)
Creatinine, Ser: 0.91 mg/dL (ref 0.61–1.24)
GFR calc Af Amer: >60 mL/min (ref >60)
GLUCOSE: 104 mg/dL — AB (ref 65–99)
POTASSIUM: 3.6 mmol/L (ref 3.5–5.1)
Sodium: 136 mmol/L (ref 135–145)

## 2015-07-21 SURGERY — OPEN REDUCTION INTERNAL FIXATION (ORIF) HAND
Anesthesia: General | Site: Finger | Laterality: Left

## 2015-07-21 MED ORDER — FENTANYL CITRATE (PF) 250 MCG/5ML IJ SOLN
INTRAMUSCULAR | Status: AC
  Filled 2015-07-21: qty 5

## 2015-07-21 MED ORDER — ONDANSETRON HCL 4 MG/2ML IJ SOLN
4.0000 mg | Freq: Once | INTRAMUSCULAR | Status: AC
  Administered 2015-07-21: 4 mg via INTRAVENOUS
  Filled 2015-07-21: qty 2

## 2015-07-21 MED ORDER — PROPOFOL 10 MG/ML IV BOLUS
INTRAVENOUS | Status: DC | PRN
  Administered 2015-07-21: 160 mg via INTRAVENOUS

## 2015-07-21 MED ORDER — MIDAZOLAM HCL 2 MG/2ML IJ SOLN
INTRAMUSCULAR | Status: AC
  Filled 2015-07-21: qty 4

## 2015-07-21 MED ORDER — EPHEDRINE SULFATE 50 MG/ML IJ SOLN
INTRAMUSCULAR | Status: AC
  Filled 2015-07-21: qty 3

## 2015-07-21 MED ORDER — OXYCODONE HCL 5 MG PO TABS: 10.0000 mg | ORAL_TABLET | ORAL | Status: DC | PRN

## 2015-07-21 MED ORDER — FENTANYL CITRATE (PF) 100 MCG/2ML IJ SOLN
INTRAMUSCULAR | Status: DC | PRN
  Administered 2015-07-21 (×2): 50 ug via INTRAVENOUS

## 2015-07-21 MED ORDER — CEPHALEXIN 500 MG PO CAPS: 500.0000 mg | ORAL_CAPSULE | Freq: Four times a day (QID) | ORAL | Status: DC

## 2015-07-21 MED ORDER — PROMETHAZINE HCL 25 MG/ML IJ SOLN
6.2500 mg | INTRAMUSCULAR | Status: DC | PRN
Start: 2015-07-21 — End: 2015-07-22

## 2015-07-21 MED ORDER — SODIUM CHLORIDE 0.9 % IV SOLN
Freq: Once | INTRAVENOUS | Status: AC
  Administered 2015-07-21: 21:00:00 via INTRAVENOUS

## 2015-07-21 MED ORDER — SUCCINYLCHOLINE CHLORIDE 20 MG/ML IJ SOLN
INTRAMUSCULAR | Status: AC
  Filled 2015-07-21: qty 1

## 2015-07-21 MED ORDER — BUPIVACAINE HCL (PF) 0.25 % IJ SOLN
INTRAMUSCULAR | Status: AC
  Filled 2015-07-21: qty 30

## 2015-07-21 MED ORDER — OXYCODONE HCL 5 MG PO TABS: 5.0000 mg | ORAL_TABLET | Freq: Once | ORAL | Status: DC | PRN

## 2015-07-21 MED ORDER — PROPOFOL 10 MG/ML IV BOLUS
INTRAVENOUS | Status: AC
  Filled 2015-07-21: qty 20

## 2015-07-21 MED ORDER — HYDROMORPHONE HCL 1 MG/ML IJ SOLN
0.5000 mg | Freq: Once | INTRAMUSCULAR | Status: AC
  Administered 2015-07-21: 0.5 mg via INTRAVENOUS
  Filled 2015-07-21: qty 1

## 2015-07-21 MED ORDER — OXYCODONE HCL 5 MG/5ML PO SOLN: 5.0000 mg | Freq: Once | ORAL | Status: DC | PRN

## 2015-07-21 MED ORDER — LIDOCAINE HCL (CARDIAC) 20 MG/ML IV SOLN
INTRAVENOUS | Status: DC | PRN
  Administered 2015-07-21: 50 mg via INTRAVENOUS

## 2015-07-21 MED ORDER — LACTATED RINGERS IV SOLN
INTRAVENOUS | Status: DC | PRN
  Administered 2015-07-21 (×2): via INTRAVENOUS

## 2015-07-21 MED ORDER — HYDROMORPHONE HCL 1 MG/ML IJ SOLN: 0.2500 mg | INTRAMUSCULAR | Status: DC | PRN

## 2015-07-21 MED ORDER — MIDAZOLAM HCL 5 MG/5ML IJ SOLN
INTRAMUSCULAR | Status: DC | PRN
  Administered 2015-07-21: 2 mg via INTRAVENOUS

## 2015-07-21 MED ORDER — LIDOCAINE HCL (CARDIAC) 20 MG/ML IV SOLN
INTRAVENOUS | Status: AC
Start: 2015-07-21 — End: 2015-07-21
  Filled 2015-07-21: qty 5

## 2015-07-21 MED ORDER — TETANUS-DIPHTH-ACELL PERTUSSIS 5-2.5-18.5 LF-MCG/0.5 IM SUSP: 0.5000 mL | Freq: Once | INTRAMUSCULAR | Status: DC

## 2015-07-21 MED ORDER — EPHEDRINE SULFATE 50 MG/ML IJ SOLN
INTRAMUSCULAR | Status: AC
Start: 2015-07-21 — End: 2015-07-21
  Filled 2015-07-21: qty 1

## 2015-07-21 MED ORDER — BUPIVACAINE HCL (PF) 0.25 % IJ SOLN
INTRAMUSCULAR | Status: DC | PRN
  Administered 2015-07-21: 10 mL

## 2015-07-21 MED ORDER — CEFAZOLIN SODIUM-DEXTROSE 2-3 GM-% IV SOLR
INTRAVENOUS | Status: DC | PRN
  Administered 2015-07-21: 2 g via INTRAVENOUS

## 2015-07-21 MED ORDER — EPHEDRINE SULFATE 50 MG/ML IJ SOLN
INTRAMUSCULAR | Status: DC | PRN
  Administered 2015-07-21: 10 mg via INTRAVENOUS

## 2015-07-21 MED ORDER — ONDANSETRON HCL 4 MG/2ML IJ SOLN
INTRAMUSCULAR | Status: DC | PRN
  Administered 2015-07-21: 4 mg via INTRAVENOUS

## 2015-07-21 SURGICAL SUPPLY — 54 items
BANDAGE COBAN STERILE 2 (GAUZE/BANDAGES/DRESSINGS) ×4 IMPLANT
BANDAGE ELASTIC 3 VELCRO ST LF (GAUZE/BANDAGES/DRESSINGS) ×4 IMPLANT
BLADE SURG ROTATE 9660 (MISCELLANEOUS) IMPLANT
BNDG COHESIVE 4X5 TAN STRL (GAUZE/BANDAGES/DRESSINGS) ×4 IMPLANT
BNDG CONFORM 2 STRL LF (GAUZE/BANDAGES/DRESSINGS) ×4 IMPLANT
BNDG ESMARK 4X9 LF (GAUZE/BANDAGES/DRESSINGS) ×4 IMPLANT
BNDG GAUZE ELAST 4 BULKY (GAUZE/BANDAGES/DRESSINGS) ×4 IMPLANT
CORDS BIPOLAR (ELECTRODE) ×4 IMPLANT
COVER SURGICAL LIGHT HANDLE (MISCELLANEOUS) ×4 IMPLANT
CUFF TOURNIQUET SINGLE 18IN (TOURNIQUET CUFF) IMPLANT
DRAIN TLS ROUND 10FR (DRAIN) IMPLANT
DRAPE OEC MINIVIEW 54X84 (DRAPES) IMPLANT
DRAPE SURG 17X23 STRL (DRAPES) ×4 IMPLANT
DRSG ADAPTIC 3X8 NADH LF (GAUZE/BANDAGES/DRESSINGS) ×4 IMPLANT
GAUZE SPONGE 4X4 12PLY STRL (GAUZE/BANDAGES/DRESSINGS) ×4 IMPLANT
GAUZE XEROFORM 1X8 LF (GAUZE/BANDAGES/DRESSINGS) ×4 IMPLANT
GLOVE BIOGEL M STRL SZ7.5 (GLOVE) ×4 IMPLANT
GLOVE SS BIOGEL STRL SZ 8 (GLOVE) ×2 IMPLANT
GLOVE SUPERSENSE BIOGEL SZ 8 (GLOVE) ×2
GOWN STRL REUS W/ TWL LRG LVL3 (GOWN DISPOSABLE) ×6 IMPLANT
GOWN STRL REUS W/ TWL XL LVL3 (GOWN DISPOSABLE) ×6 IMPLANT
GOWN STRL REUS W/TWL LRG LVL3 (GOWN DISPOSABLE) ×6
GOWN STRL REUS W/TWL XL LVL3 (GOWN DISPOSABLE) ×6
K-WIRE .045X6 DBL TRO NS (WIRE) ×4
KIT BASIN OR (CUSTOM PROCEDURE TRAY) ×4 IMPLANT
KIT ROOM TURNOVER OR (KITS) ×4 IMPLANT
KWIRE .045X6 DBL TRO NS (WIRE) ×2 IMPLANT
LOOP VESSEL MAXI BLUE (MISCELLANEOUS) IMPLANT
MANIFOLD NEPTUNE II (INSTRUMENTS) ×4 IMPLANT
NEEDLE 22X1 1/2 (OR ONLY) (NEEDLE) IMPLANT
NEEDLE 22X1 1/2 OR ONLY (MISCELLANEOUS) ×2
NEEDLE 22X1.5 STRL (OR ONLY) (MISCELLANEOUS) ×2 IMPLANT
NEEDLE HYPO 25GX1X1/2 BEV (NEEDLE) ×4 IMPLANT
NS IRRIG 1000ML POUR BTL (IV SOLUTION) ×4 IMPLANT
PACK ORTHO EXTREMITY (CUSTOM PROCEDURE TRAY) ×4 IMPLANT
PAD ARMBOARD 7.5X6 YLW CONV (MISCELLANEOUS) ×8 IMPLANT
PAD CAST 3X4 CTTN HI CHSV (CAST SUPPLIES) ×2 IMPLANT
PADDING CAST COTTON 3X4 STRL (CAST SUPPLIES) ×2
SPONGE GAUZE 4X4 12PLY STER LF (GAUZE/BANDAGES/DRESSINGS) ×4 IMPLANT
SPONGE LAP 18X18 X RAY DECT (DISPOSABLE) ×4 IMPLANT
SUT CHROMIC 5 0 P 3 (SUTURE) ×4 IMPLANT
SUT CHROMIC 6 0 PS 4 (SUTURE) ×4 IMPLANT
SUT MNCRL AB 4-0 PS2 18 (SUTURE) ×4 IMPLANT
SUT PROLENE 3 0 PS 2 (SUTURE) ×4 IMPLANT
SUT VIC AB 3-0 FS2 27 (SUTURE) IMPLANT
SYR CONTROL 10ML LL (SYRINGE) ×4 IMPLANT
SYSTEM CHEST DRAIN TLS 7FR (DRAIN) IMPLANT
TOWEL OR 17X24 6PK STRL BLUE (TOWEL DISPOSABLE) ×4 IMPLANT
TOWEL OR 17X26 10 PK STRL BLUE (TOWEL DISPOSABLE) ×4 IMPLANT
TUBE CONNECTING 12'X1/4 (SUCTIONS) ×1
TUBE CONNECTING 12X1/4 (SUCTIONS) ×3 IMPLANT
TUBE EVACUATION TLS (MISCELLANEOUS) ×4 IMPLANT
WATER STERILE IRR 1000ML POUR (IV SOLUTION) ×4 IMPLANT
YANKAUER SUCT BULB TIP NO VENT (SUCTIONS) ×4 IMPLANT

## 2015-07-21 NOTE — Anesthesia Postprocedure Evaluation (Signed)
  Anesthesia Post-op Note  Patient: Mark [name]  Procedure(s) Performed: Procedure(s): IRRIGATION AND DEBRIDMENT LEFT INDEX AND MIDDLE FINGER, OPEN REDUCTION INTERNAL FIXATION LEFT INDEX FINGER. (Left)  Patient Location: PACU  Anesthesia Type:General  Level of Consciousness: awake and alert   Airway and Oxygen Therapy: Patient Spontanous Breathing  Post-op Pain: none  Post-op Assessment: Post-op Vital signs reviewed              Post-op Vital Signs: Reviewed  Last Vitals:  Filed Vitals:   07/21/15 2345  BP:   Pulse: 60  Temp:   Resp: 16    Complications: No apparent anesthesia complications

## 2015-07-21 NOTE — Anesthesia Preprocedure Evaluation (Addendum)
Anesthesia Evaluation  Patient identified by MRN, date of birth, ID band Patient awake    Reviewed: Allergy & Precautions, NPO status , Patient's Chart, lab work & pertinent test results, reviewed documented beta blocker date and time   Airway Mallampati: II  TM Distance: >3 FB Neck ROM: Full    Dental  (+) Dental Advisory Given   Pulmonary sleep apnea and Continuous Positive Airway Pressure Ventilation , former smoker,  breath sounds clear to auscultation        Cardiovascular hypertension, Pt. on medications and Pt. on home beta blockers + CAD Rhythm:Regular Rate:Normal  05/2015 Low risk nuclear stress test.   Neuro/Psych negative neurological ROS     GI/Hepatic Neg liver ROS, GERD-  ,  Endo/Other  Hypothyroidism   Renal/GU negative Renal ROS     Musculoskeletal   Abdominal   Peds  Hematology negative hematology ROS (+)   Anesthesia Other Findings   Reproductive/Obstetrics                         Anesthesia Physical Anesthesia Plan  ASA: II  Anesthesia Plan: General   Post-op Pain Management:    Induction: Intravenous  Airway Management Planned: LMA  Additional Equipment:   Intra-op Plan:   Post-operative Plan: Extubation in OR  Informed Consent: I have reviewed the patients History and Physical, chart, labs and discussed the procedure including the risks, benefits and alternatives for the proposed anesthesia with the patient or authorized representative who has indicated his/her understanding and acceptance.   Dental advisory given  Plan Discussed with: CRNA, Anesthesiologist and Surgeon  Anesthesia Plan Comments:        Anesthesia Quick Evaluation

## 2015-07-21 NOTE — ED Notes (Signed)
Dr.Gramig has been called.

## 2015-07-21 NOTE — Discharge Instructions (Signed)

## 2015-07-21 NOTE — Op Note (Signed)
See dictation#[id number]  Status post left index finger reconstruction of an open fracture about the distal phalanx and middle finger ID and repair  Oletta Cohn.D.
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
## 2015-07-21 NOTE — Transfer of Care (Signed)
Immediate Anesthesia Transfer of Care Note  Patient: Mark [name]  Procedure(s) Performed: Procedure(s): IRRIGATION AND DEBRIDMENT LEFT INDEX AND MIDDLE FINGER, OPEN REDUCTION INTERNAL FIXATION LEFT INDEX FINGER. (Left)  Patient Location: PACU  Anesthesia Type:General  Level of Consciousness: awake  Airway & Oxygen Therapy: Patient Spontanous Breathing  Post-op Assessment: Report given to RN and Post -op Vital signs reviewed and stable  Post vital signs: Reviewed and stable  Last Vitals:  Filed Vitals:   07/21/15 2317  BP:   Pulse:   Temp: 36.5 C  Resp:     Complications: No apparent anesthesia complications
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
## 2015-07-21 NOTE — Anesthesia Procedure Notes (Signed)
Procedure Name: LMA Insertion Date/Time: 07/21/2015 10:25 PM Performed by: Brien Mates D Pre-anesthesia Checklist: Patient identified, Emergency Drugs available, Suction available, Patient being monitored and Timeout performed Patient Re-evaluated:Patient Re-evaluated prior to inductionOxygen Delivery Method: Circle system utilized Preoxygenation: Pre-oxygenation with 100% oxygen Intubation Type: IV induction Ventilation: Mask ventilation without difficulty LMA: LMA inserted LMA Size: 4.0 Number of attempts: 1 Placement Confirmation: positive ETCO2 and breath sounds checked- equal and bilateral Tube secured with: Tape Dental Injury: Teeth and Oropharynx as per pre-operative assessment

## 2015-07-22 ENCOUNTER — Other Ambulatory Visit: Payer: Self-pay | Admitting: Family Medicine
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
## 2015-07-24 ENCOUNTER — Encounter (HOSPITAL_COMMUNITY): Payer: Self-pay | Admitting: Orthopedic Surgery

## 2015-08-02 ENCOUNTER — Ambulatory Visit (INDEPENDENT_AMBULATORY_CARE_PROVIDER_SITE_OTHER): Payer: Medicare PPO | Admitting: Family Medicine

## 2015-08-02 ENCOUNTER — Encounter: Payer: Self-pay | Admitting: Family Medicine

## 2015-08-02 VITALS — BP 122/84 | Ht 71.0 in | Wt 199.0 lb

## 2015-08-02 DIAGNOSIS — R21 Rash and other nonspecific skin eruption: Secondary | ICD-10-CM | POA: Diagnosis not present

## 2015-08-02 DIAGNOSIS — S8011XD Contusion of right lower leg, subsequent encounter: Secondary | ICD-10-CM
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
## 2015-08-08 DIAGNOSIS — S62631D Displaced fracture of distal phalanx of left index finger, subsequent encounter for fracture with routine healing: Secondary | ICD-10-CM | POA: Diagnosis not present

## 2015-08-15 DIAGNOSIS — S62631D Displaced fracture of distal phalanx of left index finger, subsequent encounter for fracture with routine healing: Secondary | ICD-10-CM | POA: Diagnosis not present

## 2015-08-20 DIAGNOSIS — S62631D Displaced fracture of distal phalanx of left index finger, subsequent encounter for fracture with routine healing: Secondary | ICD-10-CM | POA: Diagnosis not present

## 2015-08-27 DIAGNOSIS — S62631D Displaced fracture of distal phalanx of left index finger, subsequent encounter for fracture with routine healing: Secondary | ICD-10-CM | POA: Diagnosis not present

## 2015-09-05 DIAGNOSIS — S62631D Displaced fracture of distal phalanx of left index finger, subsequent encounter for fracture with routine healing: Secondary | ICD-10-CM | POA: Diagnosis not present

## 2015-09-05 DIAGNOSIS — S61223D Laceration with foreign body of left middle finger without damage to nail, subsequent encounter: Secondary | ICD-10-CM | POA: Diagnosis not present

## 2015-09-10 DIAGNOSIS — G4733 Obstructive sleep apnea (adult) (pediatric): Secondary | ICD-10-CM | POA: Diagnosis not present

## 2015-10-14 ENCOUNTER — Other Ambulatory Visit: Payer: Self-pay | Admitting: Family Medicine
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
## 2015-12-03 ENCOUNTER — Telehealth: Payer: Self-pay | Admitting: Family Medicine

## 2015-12-03 DIAGNOSIS — I10 Essential (primary) hypertension: Secondary | ICD-10-CM

## 2015-12-03 DIAGNOSIS — Z79899 Other long term (current) drug therapy: Secondary | ICD-10-CM

## 2015-12-03 DIAGNOSIS — E785 Hyperlipidemia, unspecified: Secondary | ICD-10-CM

## 2015-12-03 DIAGNOSIS — E039 Hypothyroidism, unspecified: Secondary | ICD-10-CM

## 2015-12-03 NOTE — Telephone Encounter (Signed)
Lipid liver metabolic 7 hemoglobin A1c TSH

## 2015-12-03 NOTE — Telephone Encounter (Signed)
Blood work ordered in EPIC. Patient notified. 

## 2015-12-03 NOTE — Telephone Encounter (Signed)
Patient has 6 mth follow up feb. 9 and wanting to know if he needed labs done.

## 2015-12-03 NOTE — Telephone Encounter (Signed)
Patient had last labs 9/16 - CBC with diff, Met 7, Lipid, Liver, PSA, TSH, HgbA1c

## 2015-12-11 LAB — BASIC METABOLIC PANEL
BUN / CREAT RATIO: 21 (ref 10–22)
BUN: 19 mg/dL (ref 8–27)
CO2: 25 mmol/L (ref 18–29)
CREATININE: 0.92 mg/dL (ref 0.76–1.27)
Calcium: 9.2 mg/dL (ref 8.6–10.2)
Chloride: 102 mmol/L (ref 96–106)
GFR calc non Af Amer: 87 mL/min/{1.73_m2} (ref >59)
GFR, EST AFRICAN AMERICAN: 101 mL/min/{1.73_m2} (ref >59)
Glucose: 112 mg/dL — ABNORMAL HIGH (ref 65–99)
Potassium: 4.5 mmol/L (ref 3.5–5.2)
Sodium: 141 mmol/L (ref 134–144)

## 2015-12-11 LAB — HEPATIC FUNCTION PANEL
ALT: 19 IU/L (ref 0–44)
AST: 18 IU/L (ref 0–40)
Albumin: 4.1 g/dL (ref 3.6–4.8)
Alkaline Phosphatase: 59 IU/L (ref 39–117)
BILIRUBIN TOTAL: 1.9 mg/dL — AB (ref 0.0–1.2)
BILIRUBIN, DIRECT: 0.35 mg/dL (ref 0.00–0.40)
Total Protein: 6.5 g/dL (ref 6.0–8.5)

## 2015-12-11 LAB — LIPID PANEL
CHOLESTEROL TOTAL: 138 mg/dL (ref 100–199)
Chol/HDL Ratio: 3.4 ratio units (ref 0.0–5.0)
HDL: 41 mg/dL (ref >39)
LDL Calculated: 55 mg/dL (ref 0–99)
TRIGLYCERIDES: 208 mg/dL — AB (ref 0–149)
VLDL Cholesterol Cal: 42 mg/dL — ABNORMAL HIGH (ref 5–40)

## 2015-12-11 LAB — HEMOGLOBIN A1C
Est. average glucose Bld gHb Est-mCnc: 117 mg/dL
Hgb A1c MFr Bld: 5.7 % — ABNORMAL HIGH (ref 4.8–5.6)

## 2015-12-11 LAB — TSH: TSH: 2.33 u[IU]/mL (ref 0.450–4.500)

## 2015-12-26 ENCOUNTER — Ambulatory Visit: Payer: Medicare PPO | Admitting: Family Medicine

## 2015-12-27 ENCOUNTER — Other Ambulatory Visit: Payer: Self-pay | Admitting: Family Medicine

## 2015-12-27 ENCOUNTER — Ambulatory Visit (INDEPENDENT_AMBULATORY_CARE_PROVIDER_SITE_OTHER): Payer: Medicare Other | Admitting: Family Medicine

## 2015-12-27 ENCOUNTER — Encounter: Payer: Self-pay | Admitting: Family Medicine

## 2015-12-27 VITALS — BP 138/80 | Ht 71.0 in | Wt 204.2 lb

## 2015-12-27 DIAGNOSIS — M25541 Pain in joints of right hand: Secondary | ICD-10-CM | POA: Diagnosis not present

## 2015-12-27 DIAGNOSIS — M25542 Pain in joints of left hand: Secondary | ICD-10-CM | POA: Diagnosis not present

## 2015-12-27 DIAGNOSIS — K921 Melena: Secondary | ICD-10-CM
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
## 2015-12-28 LAB — SEDIMENTATION RATE: SED RATE: 4 mm/h (ref 0–30)

## 2015-12-28 LAB — RHEUMATOID FACTOR: Rhuematoid fact SerPl-aCnc: <10 IU/mL (ref 0.0–13.9)

## 2016-01-14 ENCOUNTER — Telehealth: Payer: Self-pay | Admitting: *Deleted

## 2016-01-14 ENCOUNTER — Other Ambulatory Visit: Payer: Self-pay | Admitting: *Deleted

## 2016-01-14 DIAGNOSIS — K921 Melena: Secondary | ICD-10-CM

## 2016-01-14 LAB — POC HEMOCCULT BLD/STL (HOME/3-CARD/SCREEN)
Card #2 Fecal Occult Blod, POC: NEGATIVE
Card #3 Fecal Occult Blood, POC: POSITIVE
Fecal Occult Blood, POC: NEGATIVE

## 2016-01-14 NOTE — Telephone Encounter (Signed)
Hemoccult cards negative x 2 and positive x 1. See result note.

## 2016-01-14 NOTE — Telephone Encounter (Signed)
See result note, recommend GI consult/colonoscopy ( pt had last colonoscopy with [facility]-if she desires that please set up referral with otherwise GI of choice)

## 2016-01-15 ENCOUNTER — Other Ambulatory Visit: Payer: Self-pay

## 2016-01-15 DIAGNOSIS — R195 Other fecal abnormalities: Secondary | ICD-10-CM

## 2016-01-15 NOTE — Telephone Encounter (Signed)
See result note.  

## 2016-01-21 ENCOUNTER — Encounter: Payer: Self-pay | Admitting: Family Medicine

## 2016-01-21 ENCOUNTER — Other Ambulatory Visit: Payer: Self-pay | Admitting: Family Medicine

## 2016-01-25 ENCOUNTER — Other Ambulatory Visit: Payer: Self-pay | Admitting: Family Medicine

## 2016-01-30 ENCOUNTER — Other Ambulatory Visit: Payer: Self-pay | Admitting: Family Medicine

## 2016-02-25 ENCOUNTER — Other Ambulatory Visit: Payer: Self-pay | Admitting: Family Medicine

## 2016-02-25 NOTE — Telephone Encounter (Signed)
May refill this and 5 additional refills

## 2016-02-26 ENCOUNTER — Other Ambulatory Visit: Payer: Self-pay | Admitting: Family Medicine

## 2016-03-10 ENCOUNTER — Ambulatory Visit (INDEPENDENT_AMBULATORY_CARE_PROVIDER_SITE_OTHER): Payer: Medicare Other | Admitting: Family Medicine

## 2016-03-10 ENCOUNTER — Encounter: Payer: Self-pay | Admitting: Family Medicine

## 2016-03-10 VITALS — BP 128/80 | Temp 98.8°F | Ht 71.0 in | Wt 209.0 lb

## 2016-03-10 DIAGNOSIS — N451 Epididymitis: Secondary | ICD-10-CM | POA: Diagnosis not present

## 2016-03-10 MED ORDER — DOXYCYCLINE HYCLATE 100 MG PO CAPS: 100.0000 mg | ORAL_CAPSULE | Freq: Two times a day (BID) | ORAL | Status: DC

## 2016-03-10 NOTE — Progress Notes (Signed)
   Subjective:    Patient ID: Mark [name], male    DOB: [birth year], 65 y.o.   MRN: [id number]  HPITesticle pain for 2 months. Not tried any treatments.  This been going off and on for the past couple months he's felt a nodule at times her assist he wasn't sure which it was tender at times comes and goes. There is no other particular finding with this no hematuria or dysuria recent PSA was good in addition to this patient is also having arthralgias in his hand wonders if he should take Naprosyn or Tylenol for this he had recent lab work which overall looked good.   Review of Systems    see above. Objective:   Physical Exam Mild arthralgias left hand with evidence of osteoarthritis no other evidence noted lungs are clear hearts regular blood pressure recheck is good testicular exam tenderness behind the left testicle no mass felt  Should be noted that the patient had a colonoscopy couple weeks ago we will try to get the result of that sent to us     Assessment & Plan:  Testicular pain left testicle I don't find any evidence any type of nodules or masses with the area of the tenderness I believe is most likely epididymitis. 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

## 2016-04-01 ENCOUNTER — Other Ambulatory Visit: Payer: Self-pay | Admitting: Family Medicine

## 2016-04-09 ENCOUNTER — Other Ambulatory Visit: Payer: Self-pay | Admitting: Family Medicine

## 2016-04-15 ENCOUNTER — Other Ambulatory Visit: Payer: Self-pay | Admitting: Family Medicine

## 2016-06-24 ENCOUNTER — Ambulatory Visit (INDEPENDENT_AMBULATORY_CARE_PROVIDER_SITE_OTHER): Payer: Medicare Other | Admitting: Family Medicine

## 2016-06-24 ENCOUNTER — Encounter: Payer: Self-pay | Admitting: Family Medicine

## 2016-06-24 VITALS — BP 138/82 | Ht 71.0 in | Wt 197.2 lb

## 2016-06-24 DIAGNOSIS — W57XXXA Bitten or stung by nonvenomous insect and other nonvenomous arthropods, initial encounter: Secondary | ICD-10-CM | POA: Diagnosis not present

## 2016-06-24 DIAGNOSIS — T148 Other injury of unspecified body region: Secondary | ICD-10-CM

## 2016-06-24 DIAGNOSIS — L309 Dermatitis, unspecified: Secondary | ICD-10-CM

## 2016-06-24 MED ORDER — METHYLPREDNISOLONE ACETATE 40 MG/ML IJ SUSP
40.0000 mg | Freq: Once | INTRAMUSCULAR | Status: AC
  Administered 2016-06-24: 40 mg via INTRAMUSCULAR

## 2016-06-24 MED ORDER — PREDNISONE 20 MG PO TABS: ORAL_TABLET | ORAL | 0 refills | Status: DC
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
## 2016-07-13 ENCOUNTER — Other Ambulatory Visit: Payer: Self-pay | Admitting: Family Medicine

## 2016-07-15 ENCOUNTER — Other Ambulatory Visit: Payer: Self-pay | Admitting: Family Medicine

## 2016-08-03 ENCOUNTER — Other Ambulatory Visit: Payer: Self-pay | Admitting: Family Medicine

## 2016-08-06 ENCOUNTER — Other Ambulatory Visit: Payer: Self-pay | Admitting: Family Medicine

## 2016-08-14 ENCOUNTER — Ambulatory Visit (INDEPENDENT_AMBULATORY_CARE_PROVIDER_SITE_OTHER): Payer: Medicare Other | Admitting: Family Medicine

## 2016-08-14 ENCOUNTER — Encounter: Payer: Self-pay | Admitting: Family Medicine

## 2016-08-14 VITALS — BP 126/82 | Temp 98.7°F | Ht 71.0 in | Wt 198.4 lb

## 2016-08-14 DIAGNOSIS — N509 Disorder of male genital organs, unspecified: Secondary | ICD-10-CM | POA: Diagnosis not present

## 2016-08-14 DIAGNOSIS — N5089 Other specified disorders of the male genital organs: Secondary | ICD-10-CM

## 2016-08-14 DIAGNOSIS — E039 Hypothyroidism, unspecified: Secondary | ICD-10-CM

## 2016-08-14 DIAGNOSIS — Z139 Encounter for screening, unspecified: Secondary | ICD-10-CM | POA: Diagnosis not present

## 2016-08-14 DIAGNOSIS — E785 Hyperlipidemia, unspecified: Secondary | ICD-10-CM | POA: Diagnosis not present

## 2016-08-14 DIAGNOSIS — I10 Essential (primary) hypertension: Secondary | ICD-10-CM

## 2016-08-14 NOTE — Progress Notes (Signed)
   Subjective:    Patient ID: Mark [name], male    DOB: 27-Jan-1951, 65 y.o.   MRN: [id number]  HPI Patient in today for cyst to testicle. Onset 4 weeks ago.   States no other concerns this visit.  Patient states that he noticed some lump in his right testicle region he denies pain discomfort denies dysuria urinary from C he is noticed this over the past several weeks it is now bothering him he is worried about the possibility of a tumor  Review of Systems Denies any other particular troubles    Objective:   Physical Exam Testicular lump more than likely a varicocele versus a cyst was found I doubt that this is a cancer but we will do ultrasound of this area       Assessment & Plan:  Testicular lump probable varicocele versus cyst ultrasound ordered await the results  Comprehensive lab work ordered patient does need his wellness exam and follow-up on his medication

## 2016-08-15 ENCOUNTER — Other Ambulatory Visit: Payer: Self-pay

## 2016-08-15 DIAGNOSIS — N50819 Testicular pain, unspecified: Secondary | ICD-10-CM

## 2016-08-21 ENCOUNTER — Other Ambulatory Visit: Payer: Self-pay | Admitting: Family Medicine

## 2016-08-21 ENCOUNTER — Ambulatory Visit (HOSPITAL_COMMUNITY)
Admission: RE | Admit: 2016-08-21 | Discharge: 2016-08-21 | Disposition: A | Discharge status: Home / Self Care | Payer: Medicare Other | Source: Ambulatory Visit | Attending: Family Medicine | Admitting: Family Medicine

## 2016-08-21 DIAGNOSIS — N503 Cyst of epididymis: Secondary | ICD-10-CM | POA: Diagnosis not present

## 2016-08-21 DIAGNOSIS — N50819 Testicular pain, unspecified: Secondary | ICD-10-CM | POA: Insufficient documentation

## 2016-08-21 DIAGNOSIS — N433 Hydrocele, unspecified: Secondary | ICD-10-CM | POA: Diagnosis not present

## 2016-08-21 DIAGNOSIS — N509 Disorder of male genital organs, unspecified: Secondary | ICD-10-CM | POA: Diagnosis present

## 2016-08-22 LAB — BASIC METABOLIC PANEL
BUN/Creatinine Ratio: 13 (ref 10–24)
BUN: 13 mg/dL (ref 8–27)
CO2: 25 mmol/L (ref 18–29)
Calcium: 9.4 mg/dL (ref 8.6–10.2)
Chloride: 103 mmol/L (ref 96–106)
Creatinine, Ser: 1.02 mg/dL (ref 0.76–1.27)
GFR, EST AFRICAN AMERICAN: 89 mL/min/{1.73_m2} (ref >59)
GFR, EST NON AFRICAN AMERICAN: 77 mL/min/{1.73_m2} (ref >59)
Glucose: 113 mg/dL — ABNORMAL HIGH (ref 65–99)
POTASSIUM: 4.3 mmol/L (ref 3.5–5.2)
SODIUM: 142 mmol/L (ref 134–144)

## 2016-08-22 LAB — LIPID PANEL
CHOL/HDL RATIO: 3.4 ratio (ref 0.0–5.0)
Cholesterol, Total: 144 mg/dL (ref 100–199)
HDL: 42 mg/dL (ref >39)
LDL Calculated: 70 mg/dL (ref 0–99)
TRIGLYCERIDES: 159 mg/dL — AB (ref 0–149)
VLDL Cholesterol Cal: 32 mg/dL (ref 5–40)

## 2016-08-22 LAB — HEPATIC FUNCTION PANEL
ALK PHOS: 59 IU/L (ref 39–117)
ALT: 18 IU/L (ref 0–44)
AST: 20 IU/L (ref 0–40)
Albumin: 4.5 g/dL (ref 3.6–4.8)
BILIRUBIN TOTAL: 1.4 mg/dL — AB (ref 0.0–1.2)
BILIRUBIN, DIRECT: 0.32 mg/dL (ref 0.00–0.40)
Total Protein: 6.9 g/dL (ref 6.0–8.5)

## 2016-08-22 LAB — PSA: PROSTATE SPECIFIC AG, SERUM: 1.3 ng/mL (ref 0.0–4.0)

## 2016-08-22 LAB — HEMOGLOBIN A1C
ESTIMATED AVERAGE GLUCOSE: 108 mg/dL
HEMOGLOBIN A1C: 5.4 % (ref 4.8–5.6)

## 2016-08-22 LAB — TSH: TSH: 1.94 u[IU]/mL (ref 0.450–4.500)

## 2016-08-26 ENCOUNTER — Other Ambulatory Visit: Payer: Self-pay | Admitting: Family Medicine

## 2016-08-27 ENCOUNTER — Encounter: Payer: Self-pay | Admitting: Family Medicine

## 2016-08-27 ENCOUNTER — Ambulatory Visit (INDEPENDENT_AMBULATORY_CARE_PROVIDER_SITE_OTHER): Payer: Medicare Other | Admitting: Family Medicine

## 2016-08-27 VITALS — BP 118/74 | Ht 70.75 in | Wt 200.0 lb

## 2016-08-27 DIAGNOSIS — E7849 Other hyperlipidemia: Secondary | ICD-10-CM

## 2016-08-27 DIAGNOSIS — G4733 Obstructive sleep apnea (adult) (pediatric): Secondary | ICD-10-CM | POA: Diagnosis not present

## 2016-08-27 DIAGNOSIS — E038 Other specified hypothyroidism: Secondary | ICD-10-CM

## 2016-08-27 DIAGNOSIS — E784 Other hyperlipidemia: Secondary | ICD-10-CM | POA: Diagnosis not present

## 2016-08-27 DIAGNOSIS — Z23 Encounter for immunization: Secondary | ICD-10-CM | POA: Diagnosis not present

## 2016-08-27 DIAGNOSIS — Z0000 Encounter for general adult medical examination without abnormal findings: Secondary | ICD-10-CM

## 2016-08-27 DIAGNOSIS — I10 Essential (primary) hypertension: Secondary | ICD-10-CM | POA: Diagnosis not present

## 2016-08-27 MED ORDER — OMEPRAZOLE 20 MG PO CPDR: 20.0000 mg | DELAYED_RELEASE_CAPSULE | Freq: Every day | ORAL | 1 refills | Status: DC

## 2016-08-27 MED ORDER — METOPROLOL TARTRATE 25 MG PO TABS: ORAL_TABLET | ORAL | 1 refills | Status: DC

## 2016-08-27 MED ORDER — ALPRAZOLAM 0.25 MG PO TABS: 0.2500 mg | ORAL_TABLET | Freq: Every evening | ORAL | 5 refills | Status: DC | PRN

## 2016-08-27 MED ORDER — LOSARTAN POTASSIUM 50 MG PO TABS: 50.0000 mg | ORAL_TABLET | Freq: Every day | ORAL | 1 refills | Status: DC

## 2016-08-27 MED ORDER — ATORVASTATIN CALCIUM 80 MG PO TABS: ORAL_TABLET | ORAL | 1 refills | Status: DC

## 2016-08-27 MED ORDER — LEVOTHYROXINE SODIUM 100 MCG PO TABS: 100.0000 ug | ORAL_TABLET | Freq: Every day | ORAL | 1 refills | Status: DC

## 2016-08-27 NOTE — Progress Notes (Signed)
   Subjective:    Patient ID: Pryor Montesaniel V Murrill, male    DOB: Mar 26, 1951, 65 y.o.   MRN: [id number]  HPI AWV- Annual Wellness Visit  The patient was seen for their annual wellness visit. The patient's past medical history, surgical history, and family history were reviewed. Pertinent vaccines were reviewed ( tetanus, pneumonia, shingles, flu) The patient's medication list was reviewed and updated.  The height and weight were entered. The patient's current BMI is: 28.09  Cognitive screening was completed. Outcome of Mini - Cog: pass  Falls within the past 6 months: none  Current tobacco usage: none (All patients who use tobacco were given written and verbal information on quitting)  Recent listing of emergency department/hospitalizations over the past year were reviewed.  current specialist the patient sees on a regular basis: none   Medicare annual wellness visit patient questionnaire was reviewed.  A written screening schedule for the patient for the next 5-10 years was given. Appropriate discussion of followup regarding next visit was discussed.  Flu vaccine today.   Patient denies any chest tightness pressure pain relates compliance with his cholesterol thyroid and blood pressure medicine   Review of Systems  Constitutional: Negative for fatigue and fever.  HENT: Positive for congestion.   Respiratory: Negative for cough and shortness of breath.   Cardiovascular: Negative for chest pain.  Gastrointestinal: Negative for abdominal pain, blood in stool, rectal pain and vomiting.  Genitourinary: Negative for enuresis.  Musculoskeletal: Negative for arthralgias.       Objective:   Physical Exam  Constitutional: He appears well-developed and well-nourished.  HENT:  Head: Normocephalic and atraumatic.  Right Ear: External ear normal.  Left Ear: External ear normal.  Nose: Nose normal.  Mouth/Throat: Oropharynx is clear and moist.  Eyes: EOM are normal. Pupils are  equal, round, and reactive to light.  Neck: Normal range of motion. Neck supple. No thyromegaly present.  Cardiovascular: Normal rate, regular rhythm and normal heart sounds.   No murmur heard. Pulmonary/Chest: Effort normal and breath sounds normal. No respiratory distress. He has no wheezes.  Abdominal: Soft. Bowel sounds are normal. He exhibits no distension and no mass. There is no tenderness.  Genitourinary: Rectum normal and prostate normal.  Musculoskeletal: Normal range of motion. He exhibits no edema.  Lymphadenopathy:    He has no cervical adenopathy.  Neurological: He is alert. He exhibits normal muscle tone.  Skin: Skin is warm and dry. No erythema.  Psychiatric: He has a normal mood and affect. His behavior is normal. Judgment normal.          Assessment & Plan:  Adult wellness-complete.wellness physical was conducted today. Importance of diet and exercise were discussed in detail. In addition to this a discussion regarding safety was also covered. We also reviewed over immunizations and gave recommendations regarding current immunization needed for age. 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 Follows up if any problems. Recheck in 6 months

## 2016-08-27 NOTE — Telephone Encounter (Signed)
May have 3 refills 

## 2016-09-17 ENCOUNTER — Telehealth: Payer: Self-pay | Admitting: Family Medicine

## 2016-09-17 DIAGNOSIS — N50819 Testicular pain, unspecified: Secondary | ICD-10-CM

## 2016-09-17 NOTE — Telephone Encounter (Signed)
Patient seen 9/28 and 10/11 with this issue- had ultrasound 10/5

## 2016-09-17 NOTE — Telephone Encounter (Signed)
Order for referral put in.  

## 2016-09-17 NOTE — Telephone Encounter (Signed)
Please go ahead and do referral to [facility]-let the patient know referral was put in in the usual course of a referral

## 2016-09-17 NOTE — Telephone Encounter (Signed)
Pt called stating that he is starting to have testicular pain and that he would like the referral to the specialist. Please advise.

## 2016-09-18 ENCOUNTER — Encounter: Payer: Self-pay | Admitting: Family Medicine

## 2016-09-19 ENCOUNTER — Encounter: Payer: Self-pay | Admitting: Family Medicine

## 2016-10-14 ENCOUNTER — Ambulatory Visit (INDEPENDENT_AMBULATORY_CARE_PROVIDER_SITE_OTHER): Payer: Medicare Other | Admitting: Urology

## 2016-10-14 DIAGNOSIS — N4341 Spermatocele of epididymis, single: Secondary | ICD-10-CM | POA: Diagnosis not present

## 2016-10-14 DIAGNOSIS — N3281 Overactive bladder: Secondary | ICD-10-CM | POA: Diagnosis not present

## 2016-10-16 ENCOUNTER — Other Ambulatory Visit: Payer: Self-pay | Admitting: Family Medicine

## 2017-01-14 ENCOUNTER — Other Ambulatory Visit: Payer: Self-pay | Admitting: Family Medicine

## 2017-01-16 ENCOUNTER — Other Ambulatory Visit: Payer: Self-pay | Admitting: Family Medicine

## 2017-02-25 ENCOUNTER — Ambulatory Visit: Payer: Medicare Other | Admitting: Family Medicine

## 2017-03-19 ENCOUNTER — Other Ambulatory Visit: Payer: Self-pay | Admitting: Family Medicine

## 2017-03-19 NOTE — Telephone Encounter (Signed)
May have this +4 refills 

## 2017-03-20 ENCOUNTER — Other Ambulatory Visit: Payer: Self-pay | Admitting: Family Medicine

## 2017-03-20 NOTE — Telephone Encounter (Signed)
Last seen 08/27/16

## 2017-03-23 ENCOUNTER — Other Ambulatory Visit: Payer: Self-pay | Admitting: Family Medicine

## 2017-03-24 NOTE — Telephone Encounter (Signed)
Patient may have this plus one additional refill needs follow-up office visit

## 2017-04-13 ENCOUNTER — Other Ambulatory Visit: Payer: Self-pay | Admitting: Family Medicine

## 2017-05-13 ENCOUNTER — Other Ambulatory Visit: Payer: Self-pay | Admitting: Family Medicine

## 2017-05-16 ENCOUNTER — Other Ambulatory Visit: Payer: Self-pay | Admitting: Family Medicine

## 2017-05-24 ENCOUNTER — Other Ambulatory Visit: Payer: Self-pay | Admitting: Family Medicine

## 2017-05-25 ENCOUNTER — Telehealth: Payer: Self-pay | Admitting: Family Medicine

## 2017-05-25 DIAGNOSIS — E785 Hyperlipidemia, unspecified: Secondary | ICD-10-CM

## 2017-05-25 DIAGNOSIS — E039 Hypothyroidism, unspecified: Secondary | ICD-10-CM

## 2017-05-25 NOTE — Telephone Encounter (Signed)
Discussed with pt. Pt verbalized understanding. Orders put in.  

## 2017-05-25 NOTE — Telephone Encounter (Signed)
TSH, lipid-I would not recommend the other lab work until later this year

## 2017-05-25 NOTE — Telephone Encounter (Signed)
Last labs 08/21/16 psa, liver, lipid, bmp, tsh, a1c

## 2017-05-25 NOTE — Telephone Encounter (Signed)
He picked up his refill this weekend and was told he needed an appt. Has an appt next Monday, the 16th for meds refill.  Says he needs to have his labs done at [facility] prior to appt. Please let patient know when order is put in so he can go. Thanks!

## 2017-05-29 LAB — TSH: TSH: 2.56 u[IU]/mL (ref 0.450–4.500)

## 2017-05-29 LAB — LIPID PANEL
Chol/HDL Ratio: 3.8 ratio (ref 0.0–5.0)
Cholesterol, Total: 146 mg/dL (ref 100–199)
HDL: 38 mg/dL — ABNORMAL LOW (ref >39)
LDL Calculated: 59 mg/dL (ref 0–99)
Triglycerides: 246 mg/dL — ABNORMAL HIGH (ref 0–149)
VLDL Cholesterol Cal: 49 mg/dL — ABNORMAL HIGH (ref 5–40)

## 2017-06-01 ENCOUNTER — Encounter: Payer: Self-pay | Admitting: Family Medicine

## 2017-06-01 ENCOUNTER — Ambulatory Visit (INDEPENDENT_AMBULATORY_CARE_PROVIDER_SITE_OTHER): Payer: Medicare Other | Admitting: Family Medicine

## 2017-06-01 VITALS — BP 132/88 | Ht 70.75 in | Wt 202.0 lb

## 2017-06-01 DIAGNOSIS — I10 Essential (primary) hypertension: Secondary | ICD-10-CM | POA: Diagnosis not present

## 2017-06-01 DIAGNOSIS — E039 Hypothyroidism, unspecified: Secondary | ICD-10-CM

## 2017-06-01 MED ORDER — LOSARTAN POTASSIUM 50 MG PO TABS: ORAL_TABLET | ORAL | 1 refills | Status: DC

## 2017-06-01 MED ORDER — ATORVASTATIN CALCIUM 80 MG PO TABS: ORAL_TABLET | ORAL | 1 refills | Status: DC

## 2017-06-01 MED ORDER — LEVOTHYROXINE SODIUM 100 MCG PO TABS: 100.0000 ug | ORAL_TABLET | Freq: Every day | ORAL | 1 refills | Status: DC

## 2017-06-01 MED ORDER — METOPROLOL TARTRATE 25 MG PO TABS: ORAL_TABLET | ORAL | 0 refills | Status: DC

## 2017-06-01 NOTE — Progress Notes (Signed)
   Subjective:    Patient ID: Mark [name], male    DOB: Aug 24, 1951, 66 y.o.   MRN: [id number]  Hypertension  This is a chronic problem. The current episode started more than 1 year ago. There are no compliance problems (takes meds every day, eats healthy, exercises).    Needs refill on metoprolol. Patient needs multiple medications updated. Denies any chest tightness pressure pain shortness breath We did do lab work which showed elevated triglycerides. LDL looks good. In addition to this thyroid function good. Pt states no concerns today.    Review of Systems    denies chest tightness pressure pain shortness breath nausea vomiting diarrhea fever chills Objective:   Physical Exam  Lungs clear heart regular pulse normal BP approximately 116/86 extremities no edema      Assessment & Plan:  HTN good control continue current measures Heart disease stable Hypothyroidism lab work looks good continue medication Hyperlipidemia labs reviewed Comprehensive checkup plus wellness and late fall

## 2017-06-07 ENCOUNTER — Other Ambulatory Visit: Payer: Self-pay | Admitting: Family Medicine

## 2017-06-28 ENCOUNTER — Other Ambulatory Visit: Payer: Self-pay | Admitting: Family Medicine

## 2017-07-05 ENCOUNTER — Other Ambulatory Visit: Payer: Self-pay | Admitting: Family Medicine

## 2017-07-15 ENCOUNTER — Other Ambulatory Visit: Payer: Self-pay | Admitting: Family Medicine

## 2017-07-19 ENCOUNTER — Other Ambulatory Visit: Payer: Self-pay | Admitting: Family Medicine

## 2017-07-21 ENCOUNTER — Other Ambulatory Visit: Payer: Self-pay | Admitting: Family Medicine

## 2017-07-21 NOTE — Telephone Encounter (Signed)
Last seen 06/03/17 

## 2017-07-21 NOTE — Telephone Encounter (Signed)
May give this +4 refills 

## 2017-07-24 ENCOUNTER — Other Ambulatory Visit: Payer: Self-pay | Admitting: Family Medicine

## 2017-07-24 NOTE — Telephone Encounter (Signed)
Last seen July 2018

## 2017-07-24 NOTE — Telephone Encounter (Signed)
This and 4 refills 

## 2017-08-03 ENCOUNTER — Other Ambulatory Visit: Payer: Self-pay | Admitting: Family Medicine

## 2017-08-21 ENCOUNTER — Telehealth: Payer: Self-pay | Admitting: Family Medicine

## 2017-08-21 DIAGNOSIS — Z79899 Other long term (current) drug therapy: Secondary | ICD-10-CM

## 2017-08-21 DIAGNOSIS — E039 Hypothyroidism, unspecified: Secondary | ICD-10-CM

## 2017-08-21 DIAGNOSIS — E785 Hyperlipidemia, unspecified: Secondary | ICD-10-CM

## 2017-08-21 DIAGNOSIS — Z125 Encounter for screening for malignant neoplasm of prostate: Secondary | ICD-10-CM

## 2017-08-21 DIAGNOSIS — R739 Hyperglycemia, unspecified: Secondary | ICD-10-CM

## 2017-08-21 DIAGNOSIS — I10 Essential (primary) hypertension: Secondary | ICD-10-CM

## 2017-08-21 NOTE — Telephone Encounter (Signed)
CBC, lipid, liver, metabolic 7, PSA, hemoglobin A1c, TSH-fasting hyperglycemia hypertension hyperlipidemia hypothyroidism screening

## 2017-08-21 NOTE — Telephone Encounter (Signed)
Pt is needing lab orders sent over for an upcoming appt with Dr. Lorin Picket on 10/17.

## 2017-08-21 NOTE — Telephone Encounter (Signed)
Patient had lipid and tsh 05/2017

## 2017-08-21 NOTE — Telephone Encounter (Signed)
Patient is aware and will have labs drawn prior to appt.

## 2017-09-01 LAB — LIPID PANEL
CHOL/HDL RATIO: 3.3 ratio (ref 0.0–5.0)
Cholesterol, Total: 144 mg/dL (ref 100–199)
HDL: 44 mg/dL (ref >39)
LDL Calculated: 58 mg/dL (ref 0–99)
TRIGLYCERIDES: 210 mg/dL — AB (ref 0–149)
VLDL Cholesterol Cal: 42 mg/dL — ABNORMAL HIGH (ref 5–40)

## 2017-09-01 LAB — BASIC METABOLIC PANEL
BUN / CREAT RATIO: 16 (ref 10–24)
BUN: 15 mg/dL (ref 8–27)
CO2: 24 mmol/L (ref 20–29)
CREATININE: 0.95 mg/dL (ref 0.76–1.27)
Calcium: 9.3 mg/dL (ref 8.6–10.2)
Chloride: 102 mmol/L (ref 96–106)
GFR calc Af Amer: 96 mL/min/{1.73_m2} (ref >59)
GFR calc non Af Amer: 83 mL/min/{1.73_m2} (ref >59)
GLUCOSE: 110 mg/dL — AB (ref 65–99)
POTASSIUM: 4.1 mmol/L (ref 3.5–5.2)
SODIUM: 141 mmol/L (ref 134–144)

## 2017-09-01 LAB — CBC WITH DIFFERENTIAL/PLATELET
Basophils Absolute: 0 10*3/uL (ref 0.0–0.2)
Basos: 1 %
EOS (ABSOLUTE): 0.2 10*3/uL (ref 0.0–0.4)
EOS: 4 %
HEMATOCRIT: 45.3 % (ref 37.5–51.0)
Hemoglobin: 15.4 g/dL (ref 13.0–17.7)
Immature Grans (Abs): 0 10*3/uL (ref 0.0–0.1)
Immature Granulocytes: 0 %
LYMPHS ABS: 1.8 10*3/uL (ref 0.7–3.1)
Lymphs: 37 %
MCH: 31.4 pg (ref 26.6–33.0)
MCHC: 34 g/dL (ref 31.5–35.7)
MCV: 92 fL (ref 79–97)
MONOS ABS: 0.4 10*3/uL (ref 0.1–0.9)
Monocytes: 7 %
Neutrophils Absolute: 2.5 10*3/uL (ref 1.4–7.0)
Neutrophils: 51 %
Platelets: 182 10*3/uL (ref 150–379)
RBC: 4.91 x10E6/uL (ref 4.14–5.80)
RDW: 13.5 % (ref 12.3–15.4)
WBC: 4.8 10*3/uL (ref 3.4–10.8)

## 2017-09-01 LAB — HEPATIC FUNCTION PANEL
ALBUMIN: 4.4 g/dL (ref 3.6–4.8)
ALK PHOS: 59 IU/L (ref 39–117)
ALT: 18 IU/L (ref 0–44)
AST: 17 IU/L (ref 0–40)
Bilirubin Total: 1.9 mg/dL — ABNORMAL HIGH (ref 0.0–1.2)
Bilirubin, Direct: 0.34 mg/dL (ref 0.00–0.40)
Total Protein: 6.8 g/dL (ref 6.0–8.5)

## 2017-09-01 LAB — PSA: Prostate Specific Ag, Serum: 1.5 ng/mL (ref 0.0–4.0)

## 2017-09-01 LAB — HEMOGLOBIN A1C
Est. average glucose Bld gHb Est-mCnc: 111 mg/dL
Hgb A1c MFr Bld: 5.5 % (ref 4.8–5.6)

## 2017-09-01 LAB — TSH: TSH: 3.15 u[IU]/mL (ref 0.450–4.500)

## 2017-09-02 ENCOUNTER — Encounter: Payer: Self-pay | Admitting: Family Medicine

## 2017-09-02 ENCOUNTER — Ambulatory Visit (INDEPENDENT_AMBULATORY_CARE_PROVIDER_SITE_OTHER): Payer: Medicare Other | Admitting: Family Medicine

## 2017-09-02 VITALS — BP 128/72 | Ht 70.75 in | Wt 202.0 lb

## 2017-09-02 DIAGNOSIS — I10 Essential (primary) hypertension: Secondary | ICD-10-CM | POA: Diagnosis not present

## 2017-09-02 DIAGNOSIS — Z23 Encounter for immunization: Secondary | ICD-10-CM | POA: Diagnosis not present

## 2017-09-02 DIAGNOSIS — Z0000 Encounter for general adult medical examination without abnormal findings: Secondary | ICD-10-CM

## 2017-09-02 DIAGNOSIS — M545 Low back pain, unspecified: Secondary | ICD-10-CM

## 2017-09-02 DIAGNOSIS — E7849 Other hyperlipidemia: Secondary | ICD-10-CM

## 2017-09-02 DIAGNOSIS — E038 Other specified hypothyroidism: Secondary | ICD-10-CM

## 2017-09-02 MED ORDER — OMEPRAZOLE 20 MG PO CPDR: 20.0000 mg | DELAYED_RELEASE_CAPSULE | Freq: Every day | ORAL | 1 refills | Status: DC

## 2017-09-02 MED ORDER — LEVOTHYROXINE SODIUM 100 MCG PO TABS: 100.0000 ug | ORAL_TABLET | Freq: Every day | ORAL | 1 refills | Status: DC

## 2017-09-02 NOTE — Patient Instructions (Signed)

## 2017-09-02 NOTE — Progress Notes (Signed)
   Subjective:    Patient ID: Mark [name], male    DOB: 1951/10/21, 66 y.o.   MRN: [id number]  HPI The patient comes in today for a wellness visit.    A review of their health history was completed.  A review of medications was also completed.  Any needed refills; No  Eating habits: Good  Falls/  MVA accidents in past few months: None  Regular exercise: Yes  Specialist pt sees on regular basis: None  Preventative health issues were discussed.   Additional concerns: has lower back pain Low back pain related into a lot of work is been doing does not keep him up at night relates low back pain does not radiate down the legs no numbness or tingling Denies any muscle weakness loss of bowel or bladder control  Blood pressure [name] is been under decent control with medication denies any heart disease symptoms currently  Thyroid under good control with his medication Cholesterol under good control with his medication.   Review of Systems  Constitutional: Negative for activity change, appetite change and fever.  HENT: Negative for congestion and rhinorrhea.   Eyes: Negative for discharge.  Respiratory: Negative for cough and wheezing.   Cardiovascular: Negative for chest pain.  Gastrointestinal: Negative for abdominal pain, blood in stool and vomiting.  Genitourinary: Negative for difficulty urinating and frequency.  Musculoskeletal: Negative for neck pain.  Skin: Negative for rash.  Allergic/Immunologic: Negative for environmental allergies and food allergies.  Neurological: Negative for weakness and headaches.  Psychiatric/Behavioral: Negative for agitation.       Objective:   Physical Exam  Constitutional: He appears well-developed and well-nourished.  HENT:  Head: Normocephalic and atraumatic.  Right Ear: External ear normal.  Left Ear: External ear normal.  Nose: Nose normal.  Mouth/Throat: Oropharynx is clear and moist.  Eyes: Pupils are equal, round,  and reactive to light. EOM are normal.  Neck: Normal range of motion. Neck supple. No thyromegaly present.  Cardiovascular: Normal rate, regular rhythm and normal heart sounds.   No murmur heard. Pulmonary/Chest: Effort normal and breath sounds normal. No respiratory distress. He has no wheezes.  Abdominal: Soft. Bowel sounds are normal. He exhibits no distension and no mass. There is no tenderness.  Genitourinary: Penis normal.  Musculoskeletal: Normal range of motion. He exhibits no edema.  Lymphadenopathy:    He has no cervical adenopathy.  Neurological: He is alert. He exhibits normal muscle tone.  Skin: Skin is warm and dry. No erythema.  Psychiatric: He has a normal mood and affect. His behavior is normal. Judgment normal.          Assessment & Plan:  Adult wellness-complete.wellness physical was conducted today. Importance of diet and exercise were discussed in detail. In addition to this a discussion regarding safety was also covered. We also reviewed over immunizations and gave recommendations regarding current immunization needed for age. In addition to this additional areas were also touched on including: Preventative health exams needed: Colonoscopy reportedly 2019 is his next colonoscopy  Patient was advised yearly wellness exam  Hyperlipidemia under good control with medication watch diet closely Blood pressure under good control No sign of heart disease currently Follow-up 6 months Low back pain musculoskeletal stretching exercises shown

## 2017-11-12 ENCOUNTER — Ambulatory Visit: Payer: Medicare Other | Admitting: Family Medicine

## 2017-11-12 ENCOUNTER — Encounter: Payer: Self-pay | Admitting: Family Medicine

## 2017-11-12 VITALS — BP 144/88 | Temp 98.6°F | Ht 70.75 in | Wt 207.0 lb

## 2017-11-12 DIAGNOSIS — J329 Chronic sinusitis, unspecified: Secondary | ICD-10-CM | POA: Diagnosis not present

## 2017-11-12 MED ORDER — AMOXICILLIN-POT CLAVULANATE 875-125 MG PO TABS: ORAL_TABLET | ORAL | 0 refills | Status: DC

## 2017-11-12 NOTE — Progress Notes (Signed)
   Subjective:    Patient ID: Mark [name], male    DOB: [birth year], 66 y.o.   MRN: [id number]  HPI  He states he has had a cough for about a month that will not go away. It is worse in the am. He has been taking mucinex D and saline in nose and he says bp was running on the high side so he stopped the mucinex. Feels "raw in my lungs".persistent cough almost four weeks  Got better for awhile and now worse again  Was having some tenderness in chest with har cough   Notes a slight wheezy tendency to cough  Cough productive times  Review of Systems No headache, no major weight loss or weight gain, no chest pain no back pain abdominal pain no change in bowel habits complete ROS otherwise negative     Objective:   Physical Exam Alert, mild malaise. Hydration good Vitals stable. frontal/ maxillary tenderness evident positive nasal congestion. pharynx normal neck supple  lungs clear/no crackles with cough slight wheezes. heart regular in rhythm       Assessment & Plan:  Impression rhinosinusitis/bronchitis with element of.  Patient wishes to hold off on inhaler at this time likely post viral, discussed with patient. plan antibiotics prescribed. Questions answered. Symptomatic care discussed. warning signs discussed. WSL

## 2017-11-24 ENCOUNTER — Telehealth: Payer: Self-pay | Admitting: Family Medicine

## 2017-11-24 MED ORDER — ALBUTEROL SULFATE HFA 108 (90 BASE) MCG/ACT IN AERS: 2.0000 | INHALATION_SPRAY | Freq: Four times a day (QID) | RESPIRATORY_TRACT | 0 refills | Status: DC | PRN

## 2017-11-24 NOTE — Telephone Encounter (Signed)
Prescription sent electronically to pharmacy. Patient notified. 

## 2017-11-24 NOTE — Telephone Encounter (Signed)
Patient seen Dr. Brett CanalesSteve on 11/12/17 for rhinosinusitis.  He finished his antibiotic but still has chest congestion.  He is requesting Rx for inhaler called in.   [facility]

## 2017-11-24 NOTE — Telephone Encounter (Signed)
Was seen 11/12/17 and given Augmentin

## 2017-11-24 NOTE — Telephone Encounter (Signed)
Ok albuterol inhaler 

## 2017-12-01 ENCOUNTER — Telehealth: Payer: Self-pay | Admitting: Family Medicine

## 2017-12-01 NOTE — Telephone Encounter (Signed)
The news that is on TV or in the paper does not tell the full story.  The recall has to do with an impurity that is present in some of the medications.The actual ingredient losartan is very safe.  There were certain manufacturers that had impurities in their manufacturing process that ended up in the pill.  Therefore certain manufacturers had their medicines recalled.  Not all manufacturer's had this problem.  I would recommend for the patient to talk to his pharmacy to find out if the supplier of their losartan is approved or not.  More than likely his supply is fine-if his pharmacist says this supply is not fine then he needs to let us know

## 2017-12-01 NOTE — Telephone Encounter (Signed)
Left message to return call 

## 2017-12-01 NOTE — Telephone Encounter (Signed)
Pt wonders if he should continue to take his Losartan  He's worried about what he's been seeing on the news  Please advise

## 2017-12-02 NOTE — Telephone Encounter (Signed)
Patient notified and verbalized understanding and stated he will speak with his pharmacy and contact us if any further problems or concerns.

## 2017-12-21 ENCOUNTER — Other Ambulatory Visit: Payer: Self-pay | Admitting: Family Medicine

## 2018-01-21 ENCOUNTER — Other Ambulatory Visit: Payer: Self-pay | Admitting: Family Medicine

## 2018-01-21 NOTE — Telephone Encounter (Signed)
May have this and four refills

## 2018-02-04 ENCOUNTER — Telehealth: Payer: Self-pay | Admitting: Family Medicine

## 2018-02-04 DIAGNOSIS — E038 Other specified hypothyroidism: Secondary | ICD-10-CM

## 2018-02-04 DIAGNOSIS — E7849 Other hyperlipidemia: Secondary | ICD-10-CM

## 2018-02-04 DIAGNOSIS — I10 Essential (primary) hypertension: Secondary | ICD-10-CM

## 2018-02-04 NOTE — Telephone Encounter (Signed)
Patient has an appointment on 03/04/18 with Dr. Lorin PicketScott.  He is requesting orders for labs.

## 2018-02-04 NOTE — Telephone Encounter (Signed)
Last labs 08/2017:  Lipid, Liver, HgbA1c, CBC, Met 7, PSA and TSH

## 2018-02-05 ENCOUNTER — Other Ambulatory Visit: Payer: Self-pay | Admitting: Family Medicine

## 2018-02-05 DIAGNOSIS — Z0000 Encounter for general adult medical examination without abnormal findings: Secondary | ICD-10-CM

## 2018-02-05 DIAGNOSIS — E782 Mixed hyperlipidemia: Secondary | ICD-10-CM

## 2018-02-05 NOTE — Telephone Encounter (Signed)
Labs placed in Epic; pt notified 

## 2018-02-05 NOTE — Telephone Encounter (Signed)
Metabolic 7, lipid, liver, TSH

## 2018-02-22 ENCOUNTER — Telehealth: Payer: Self-pay | Admitting: Family Medicine

## 2018-02-22 ENCOUNTER — Other Ambulatory Visit: Payer: Self-pay | Admitting: *Deleted

## 2018-02-22 MED ORDER — LOSARTAN POTASSIUM 50 MG PO TABS: ORAL_TABLET | ORAL | 1 refills | Status: DC

## 2018-02-22 MED ORDER — METOPROLOL TARTRATE 25 MG PO TABS: ORAL_TABLET | ORAL | 5 refills | Status: DC

## 2018-02-22 NOTE — Telephone Encounter (Signed)
We prescribed at half a tablet a day but patient has increased himself to a whole tablet a day and would like a new prescription to reflect this

## 2018-02-22 NOTE — Telephone Encounter (Signed)
Med sent to pharm. Left message on voicemail to notify pt.

## 2018-02-22 NOTE — Telephone Encounter (Signed)
Left message for pt to return call to let him know refill sent and to keep follow up visit

## 2018-02-22 NOTE — Telephone Encounter (Signed)
Patient requesting new prescription on losartan 50 mg is taking a whole pill instead of half to keep pressure down.[facility]

## 2018-02-22 NOTE — Telephone Encounter (Signed)
May use losartan 50 mg 1 daily, #90, 1 refill, keep all regular follow-up visits

## 2018-02-23 NOTE — Telephone Encounter (Signed)
Spoke with patient, stated the pharmacy contacted him and he has picked med up

## 2018-02-26 LAB — BASIC METABOLIC PANEL
BUN / CREAT RATIO: 15 (ref 10–24)
BUN: 15 mg/dL (ref 8–27)
CHLORIDE: 103 mmol/L (ref 96–106)
CO2: 24 mmol/L (ref 20–29)
Calcium: 9.3 mg/dL (ref 8.6–10.2)
Creatinine, Ser: 0.99 mg/dL (ref 0.76–1.27)
GFR calc Af Amer: 91 mL/min/{1.73_m2} (ref >59)
GFR calc non Af Amer: 78 mL/min/{1.73_m2} (ref >59)
GLUCOSE: 106 mg/dL — AB (ref 65–99)
Potassium: 4.4 mmol/L (ref 3.5–5.2)
SODIUM: 141 mmol/L (ref 134–144)

## 2018-02-26 LAB — HEPATIC FUNCTION PANEL
ALBUMIN: 4.4 g/dL (ref 3.6–4.8)
ALT: 20 IU/L (ref 0–44)
AST: 20 IU/L (ref 0–40)
Alkaline Phosphatase: 61 IU/L (ref 39–117)
BILIRUBIN TOTAL: 1.6 mg/dL — AB (ref 0.0–1.2)
Bilirubin, Direct: 0.34 mg/dL (ref 0.00–0.40)
TOTAL PROTEIN: 6.5 g/dL (ref 6.0–8.5)

## 2018-02-26 LAB — LIPID PANEL
CHOL/HDL RATIO: 3.7 ratio (ref 0.0–5.0)
Cholesterol, Total: 163 mg/dL (ref 100–199)
HDL: 44 mg/dL (ref >39)
LDL CALC: 66 mg/dL (ref 0–99)
Triglycerides: 263 mg/dL — ABNORMAL HIGH (ref 0–149)
VLDL CHOLESTEROL CAL: 53 mg/dL — AB (ref 5–40)

## 2018-02-26 LAB — TSH: TSH: 4.05 u[IU]/mL (ref 0.450–4.500)

## 2018-03-01 ENCOUNTER — Other Ambulatory Visit: Payer: Self-pay | Admitting: Family Medicine

## 2018-03-03 ENCOUNTER — Ambulatory Visit: Payer: Medicare Other | Admitting: Family Medicine

## 2018-03-04 ENCOUNTER — Encounter: Payer: Self-pay | Admitting: Family Medicine

## 2018-03-04 ENCOUNTER — Ambulatory Visit: Payer: Medicare Other | Admitting: Family Medicine

## 2018-03-04 VITALS — BP 140/84 | Ht 70.75 in | Wt 206.0 lb

## 2018-03-04 DIAGNOSIS — E7849 Other hyperlipidemia: Secondary | ICD-10-CM | POA: Diagnosis not present

## 2018-03-04 DIAGNOSIS — G4733 Obstructive sleep apnea (adult) (pediatric): Secondary | ICD-10-CM | POA: Diagnosis not present

## 2018-03-04 DIAGNOSIS — I10 Essential (primary) hypertension: Secondary | ICD-10-CM | POA: Diagnosis not present

## 2018-03-04 DIAGNOSIS — E038 Other specified hypothyroidism: Secondary | ICD-10-CM | POA: Diagnosis not present

## 2018-03-04 MED ORDER — ATORVASTATIN CALCIUM 80 MG PO TABS: ORAL_TABLET | ORAL | 1 refills | Status: DC

## 2018-03-04 MED ORDER — RANITIDINE HCL 300 MG PO TABS: 300.0000 mg | ORAL_TABLET | Freq: Every day | ORAL | 1 refills | Status: DC

## 2018-03-04 MED ORDER — LEVOTHYROXINE SODIUM 100 MCG PO TABS: 100.0000 ug | ORAL_TABLET | Freq: Every day | ORAL | 1 refills | Status: DC

## 2018-03-04 MED ORDER — TRIAMCINOLONE ACETONIDE 0.1 % EX CREA: 1.0000 "application " | TOPICAL_CREAM | Freq: Two times a day (BID) | CUTANEOUS | 4 refills | Status: DC

## 2018-03-04 MED ORDER — METOPROLOL SUCCINATE ER 25 MG PO TB24: ORAL_TABLET | ORAL | 3 refills | Status: DC

## 2018-03-04 NOTE — Progress Notes (Signed)
Subjective:    Patient ID: Mark [name], male    DOB: 1951-05-05, 67 y.o.   MRN: [id number]  HPI Pt here today for follow up on HTN, hypthyroidism, and hyperlipidemia. Pt states everything is going ok. Needs refill on Lopressor.   Patient does have ongoing trouble with reflux.  Takes medication on a regular basis.  Tries to minimize foods as best they can.  They understand the importance of dietary compliance.  May also try to avoid eating a large meal close to bedtime.  Patient denies any dysphagia denies hematochezia.  States medicine does a good job keeping the problem under good control.  Without the medication may certainly have issues.They desire to continue taking their medication.  Patient for blood pressure check up. Patient relates compliance with meds. Todays BP reviewed with the patient. Patient denies issues with medication. Patient relates reasonable diet. Patient tries to minimize salt. Patient aware of BP goals.  Patient here for follow-up regarding cholesterol.  Patient does try to maintain a reasonable diet.  Patient does take the medication on a regular basis.  Denies missing a dose.  The patient denies any obvious side effects.  Prior blood work results reviewed with the patient.  The patient is aware of his cholesterol goals and the need to keep it under good control to lessen the risk of disease.  Patient suffers with insomnia.  This is been going on for a while.  The patient finds it necessary to use medication to help sleep.  Patient finds it if not using medication has significant troubles.  Denies abusing the medication.  Denies any negative side effects.  Patient has thyroid condition.  Takes thyroid medication on a regular basis.  States that the proper way.  Relates compliance.  States no negative side effects.  States condition seems to be under good control.   Results for orders placed or performed in visit on 02/05/18  TSH  Result Value Ref Range   TSH  4.050 0.450 - 4.500 uIU/mL  Hepatic function panel  Result Value Ref Range   Total Protein 6.5 6.0 - 8.5 g/dL   Albumin 4.4 3.6 - 4.8 g/dL   Bilirubin Total 1.6 (H) 0.0 - 1.2 mg/dL   Bilirubin, Direct 4.09 0.00 - 0.40 mg/dL   Alkaline Phosphatase 61 39 - 117 IU/L   AST 20 0 - 40 IU/L   ALT 20 0 - 44 IU/L  Lipid Profile  Result Value Ref Range   Cholesterol, Total 163 100 - 199 mg/dL   Triglycerides 811 (H) 0 - 149 mg/dL   HDL 44 >91 mg/dL   VLDL Cholesterol Cal 53 (H) 5 - 40 mg/dL   LDL Calculated 66 0 - 99 mg/dL   Chol/HDL Ratio 3.7 0.0 - 5.0 ratio  Basic Metabolic Panel (BMET)  Result Value Ref Range   Glucose 106 (H) 65 - 99 mg/dL   BUN 15 8 - 27 mg/dL   Creatinine, Ser 4.78 0.76 - 1.27 mg/dL   GFR calc non Af Amer 78 >59 mL/min/1.73   GFR calc Af Amer 91 >59 mL/min/1.73   BUN/Creatinine Ratio 15 10 - 24   Sodium 141 134 - 144 mmol/L   Potassium 4.4 3.5 - 5.2 mmol/L   Chloride 103 96 - 106 mmol/L   CO2 24 20 - 29 mmol/L   Calcium 9.3 8.6 - 10.2 mg/dL     Review of Systems  Constitutional: Negative for activity change, appetite change and fatigue.  HENT:  Negative for congestion and rhinorrhea.   Respiratory: Negative for cough, chest tightness and shortness of breath.   Cardiovascular: Negative for chest pain and leg swelling.  Gastrointestinal: Negative for abdominal pain, diarrhea and nausea.  Endocrine: Negative for polydipsia and polyphagia.  Genitourinary: Negative for dysuria and hematuria.  Neurological: Negative for weakness and headaches.  Psychiatric/Behavioral: Negative for confusion and dysphoric mood.       Objective:   Physical Exam  Constitutional: He appears well-nourished. No distress.  HENT:  Head: Normocephalic and atraumatic.  Eyes: Right eye exhibits no discharge. Left eye exhibits no discharge.  Neck: No tracheal deviation present.  Cardiovascular: Normal rate, regular rhythm and normal heart sounds.  No murmur heard. Pulmonary/Chest:  Effort normal and breath sounds normal. No respiratory distress. He has no wheezes.  Musculoskeletal: He exhibits no edema.  Lymphadenopathy:    He has no cervical adenopathy.  Neurological: He is alert.  Skin: Skin is warm. No rash noted.  Psychiatric: His behavior is normal.  Vitals reviewed.   25 minutes was spent with the patient.  This statement verifies that 25 minutes was indeed spent with the patient. Greater than half the time was spent in discussion, counseling and answering questions  regarding the issues that the patient came in for today as reflected in the diagnosis (s) please refer to documentation for further details.       Assessment & Plan:  Hypothyroidism continue current medication lab work looks good  Hyperlipidemia continue current medication lab work reviewed watch diet  HTN good control continue current measures  Sleep apnea uses CPAP machine continue this  Difficult time getting back to sleep in the middle night takes a half of a Xanax tablet at nighttime seems to help  History of heart disease patient does use metoprolol we will go to long-acting metoprolol  Reflux-patient will try ranitidine but that does not do well enough he will go back to omeprazole he is try to get away for the potential side effects of PPI  Follow-up 6 months

## 2018-03-23 ENCOUNTER — Other Ambulatory Visit: Payer: Self-pay | Admitting: Family Medicine

## 2018-05-17 DIAGNOSIS — R9439 Abnormal result of other cardiovascular function study: Secondary | ICD-10-CM

## 2018-05-17 HISTORY — DX: Abnormal result of other cardiovascular function study: R94.39

## 2018-05-18 ENCOUNTER — Telehealth: Payer: Self-pay | Admitting: Family Medicine

## 2018-05-18 ENCOUNTER — Other Ambulatory Visit: Payer: Self-pay

## 2018-05-18 MED ORDER — OMEPRAZOLE 20 MG PO CPDR: 20.0000 mg | DELAYED_RELEASE_CAPSULE | Freq: Every day | ORAL | 1 refills | Status: DC

## 2018-05-18 NOTE — Telephone Encounter (Signed)
Patient would like to go back to his old reflux medication he states that the ranitidine 300 mg is not helping. [facility] uses [facility]

## 2018-05-18 NOTE — Telephone Encounter (Signed)
In this patient I would recommend to go back on the omeprazole 1 every single day.  If he needs a refill please send in 90-day room with one additional refill and he can stop the ranitidine if he has ongoing troubles please notify us

## 2018-05-18 NOTE — Telephone Encounter (Signed)
Omeprazole sent in and Ranitidine discontinued. Pt notified and verbalized understanding.

## 2018-05-18 NOTE — Telephone Encounter (Signed)
Pt was on Omeprazole 20mg . On 03/04/18 Ranitidine 300 mg was ordered. Pt states he tried the Ranitidine for 2 weeks but indigestion did not get better. Pt states the  Omeprazole gets rid of his indigestion.

## 2018-06-02 ENCOUNTER — Ambulatory Visit: Payer: Medicare Other | Admitting: Family Medicine

## 2018-06-02 ENCOUNTER — Encounter: Payer: Self-pay | Admitting: Family Medicine

## 2018-06-02 VITALS — BP 110/72 | Temp 98.8°F | Ht 70.0 in | Wt 204.8 lb

## 2018-06-02 DIAGNOSIS — N410 Acute prostatitis: Secondary | ICD-10-CM | POA: Diagnosis not present

## 2018-06-02 DIAGNOSIS — R350 Frequency of micturition: Secondary | ICD-10-CM

## 2018-06-02 DIAGNOSIS — R3912 Poor urinary stream: Secondary | ICD-10-CM | POA: Diagnosis not present

## 2018-06-02 LAB — POCT URINALYSIS DIPSTICK
SPEC GRAV UA: 1.02 (ref 1.010–1.025)
pH, UA: 5 (ref 5.0–8.0)

## 2018-06-02 MED ORDER — MUPIROCIN 2 % EX OINT: TOPICAL_OINTMENT | CUTANEOUS | 0 refills | Status: AC

## 2018-06-02 MED ORDER — CIPROFLOXACIN HCL 500 MG PO TABS: 500.0000 mg | ORAL_TABLET | Freq: Two times a day (BID) | ORAL | 0 refills | Status: DC

## 2018-06-02 MED ORDER — TAMSULOSIN HCL 0.4 MG PO CAPS: 0.4000 mg | ORAL_CAPSULE | Freq: Every day | ORAL | 0 refills | Status: DC

## 2018-06-02 NOTE — Progress Notes (Signed)
   Subjective:    Patient ID: Mark [name], male    DOB: 10-Jan-1951, 67 y.o.   MRN: [id number]  Urinary Frequency   This is a chronic problem. Episode onset: 2 -3 weeks ago. Associated symptoms include flank pain, frequency, hesitancy and urgency. Pertinent negatives include no hematuria or nausea. Treatments tried: heating pad. The treatment provided mild relief.  Urinary frequency and lower abdominal pressure lower back pain radiates into the right buttock but not down the leg denies fever chills sweats or hematuria Results for orders placed or performed in visit on 06/02/18  POCT urinalysis dipstick  Result Value Ref Range   Color, UA     Clarity, UA     Glucose, UA  Negative   Bilirubin, UA     Ketones, UA     Spec Grav, UA 1.020 1.010 - 1.025   Blood, UA     pH, UA 5.0 5.0 - 8.0   Protein, UA  Negative   Urobilinogen, UA  0.2 or 1.0 E.U./dL   Nitrite, UA     Leukocytes, UA  Negative   Appearance     Odor       Review of Systems  Constitutional: Negative for activity change, appetite change, fatigue, fever and unexpected weight change.  HENT: Negative for congestion and rhinorrhea.   Respiratory: Negative for cough, chest tightness and shortness of breath.   Cardiovascular: Negative for chest pain and leg swelling.  Gastrointestinal: Negative for abdominal pain, diarrhea and nausea.  Endocrine: Negative for polydipsia and polyphagia.  Genitourinary: Positive for flank pain, frequency, hesitancy and urgency. Negative for dysuria and hematuria.  Neurological: Negative for weakness and headaches.  Psychiatric/Behavioral: Negative for confusion and dysphoric mood.       Objective:   Physical Exam  Constitutional: He appears well-nourished. No distress.  HENT:  Head: Normocephalic and atraumatic.  Eyes: Right eye exhibits no discharge. Left eye exhibits no discharge.  Cardiovascular: Normal rate, regular rhythm and normal heart sounds.  No murmur  heard. Pulmonary/Chest: Effort normal and breath sounds normal. No respiratory distress.  Musculoskeletal: He exhibits no edema or tenderness.  Lymphadenopathy:    He has no cervical adenopathy.  Neurological: He is alert.  Psychiatric: His behavior is normal.  Vitals reviewed.     15 minutes was spent with patient today discussing healthcare issues which they came.  More than 50% of this visit-total duration of visit-was spent in counseling and coordination of care.  Please see diagnosis regarding the focus of this coordination and care     Assessment & Plan:  BPH Prostatitis Antibiotics twice daily for the next 3 weeks Flomax to help with urinary flow caution side effects Once over that infection can stop Flomax If progressive troubles or worse follow-up I doubt musculoskeletal injury or disc disease If back pain does not go away he needs to follow-up Always keep regular follow-up visits

## 2018-06-02 NOTE — Patient Instructions (Signed)
Prostatitis Prostatitis is swelling of the prostate gland. The prostate helps to make semen. It is below a man's bladder, in front of the rectum. There are different types of prostatitis. Follow these instructions at home:  Take over-the-counter and prescription medicines only as told by your doctor.  If you were prescribed an antibiotic medicine, take it as told by your doctor. Do not stop taking the antibiotic even if you start to feel better.  If your doctor prescribed exercises, do them as directed.  Take sitz baths as told by your doctor. To take a sitz bath, sit in warm water that is deep enough to cover your hips and butt.  Keep all follow-up visits as told by your doctor. This is important. Contact a doctor if:  Your symptoms get worse.  You have a fever. Get help right away if:  You have chills.  You feel sick to your stomach (nauseous).  You throw up (vomit).  You feel light-headed.  You feel like you might pass out (faint).  You cannot pee (urinate).  You have blood or clumps of blood (blood clots) in your pee (urine). This information is not intended to replace advice given to you by your health care provider. Make sure you discuss any questions you have with your health care provider. Document Released: 05/04/2012 Document Revised: 07/24/2016 Document Reviewed: 07/24/2016 [employer] Interactive Patient Education  2017 [employer]  

## 2018-06-03 ENCOUNTER — Encounter (HOSPITAL_COMMUNITY): Payer: Self-pay | Admitting: Emergency Medicine

## 2018-06-03 ENCOUNTER — Emergency Department (HOSPITAL_COMMUNITY): Payer: Medicare Other

## 2018-06-03 ENCOUNTER — Other Ambulatory Visit: Payer: Self-pay

## 2018-06-03 ENCOUNTER — Inpatient Hospital Stay (HOSPITAL_COMMUNITY)
Admission: EM | Admit: 2018-06-03 | Discharge: 2018-06-07 | DRG: 287 | Disposition: A | Discharge status: Home / Self Care | Payer: Medicare Other | Attending: Cardiology | Admitting: Cardiology

## 2018-06-03 DIAGNOSIS — E785 Hyperlipidemia, unspecified: Secondary | ICD-10-CM | POA: Diagnosis not present

## 2018-06-03 DIAGNOSIS — R0789 Other chest pain: Secondary | ICD-10-CM | POA: Diagnosis not present

## 2018-06-03 DIAGNOSIS — I209 Angina pectoris, unspecified: Secondary | ICD-10-CM

## 2018-06-03 DIAGNOSIS — I2510 Atherosclerotic heart disease of native coronary artery without angina pectoris: Secondary | ICD-10-CM | POA: Diagnosis not present

## 2018-06-03 DIAGNOSIS — G4733 Obstructive sleep apnea (adult) (pediatric): Secondary | ICD-10-CM | POA: Diagnosis present

## 2018-06-03 DIAGNOSIS — Z79890 Hormone replacement therapy (postmenopausal): Secondary | ICD-10-CM

## 2018-06-03 DIAGNOSIS — R079 Chest pain, unspecified: Secondary | ICD-10-CM

## 2018-06-03 DIAGNOSIS — Z87891 Personal history of nicotine dependence: Secondary | ICD-10-CM

## 2018-06-03 DIAGNOSIS — N400 Enlarged prostate without lower urinary tract symptoms: Secondary | ICD-10-CM | POA: Diagnosis present

## 2018-06-03 DIAGNOSIS — R9439 Abnormal result of other cardiovascular function study: Secondary | ICD-10-CM | POA: Diagnosis not present

## 2018-06-03 DIAGNOSIS — K210 Gastro-esophageal reflux disease with esophagitis: Secondary | ICD-10-CM | POA: Diagnosis present

## 2018-06-03 DIAGNOSIS — K209 Esophagitis, unspecified without bleeding: Secondary | ICD-10-CM | POA: Diagnosis present

## 2018-06-03 DIAGNOSIS — Z8249 Family history of ischemic heart disease and other diseases of the circulatory system: Secondary | ICD-10-CM | POA: Diagnosis not present

## 2018-06-03 DIAGNOSIS — E876 Hypokalemia: Secondary | ICD-10-CM | POA: Diagnosis not present

## 2018-06-03 DIAGNOSIS — M542 Cervicalgia: Secondary | ICD-10-CM | POA: Diagnosis not present

## 2018-06-03 DIAGNOSIS — E039 Hypothyroidism, unspecified: Secondary | ICD-10-CM | POA: Diagnosis not present

## 2018-06-03 DIAGNOSIS — Z951 Presence of aortocoronary bypass graft: Secondary | ICD-10-CM

## 2018-06-03 DIAGNOSIS — Z7982 Long term (current) use of aspirin: Secondary | ICD-10-CM

## 2018-06-03 DIAGNOSIS — G4700 Insomnia, unspecified: Secondary | ICD-10-CM | POA: Diagnosis present

## 2018-06-03 DIAGNOSIS — I10 Essential (primary) hypertension: Secondary | ICD-10-CM | POA: Diagnosis present

## 2018-06-03 HISTORY — DX: Chest pain, unspecified: R07.9

## 2018-06-03 HISTORY — DX: Obstructive sleep apnea (adult) (pediatric): G47.33

## 2018-06-03 HISTORY — DX: Abnormal result of other cardiovascular function study: R94.39

## 2018-06-03 HISTORY — DX: Retention of urine, unspecified: R33.9

## 2018-06-03 LAB — BASIC METABOLIC PANEL
ANION GAP: 8 (ref 5–15)
BUN: 18 mg/dL (ref 8–23)
CHLORIDE: 102 mmol/L (ref 98–111)
CO2: 26 mmol/L (ref 22–32)
Calcium: 9.3 mg/dL (ref 8.9–10.3)
Creatinine, Ser: 0.94 mg/dL (ref 0.61–1.24)
GFR calc Af Amer: >60 mL/min (ref >60)
GLUCOSE: 105 mg/dL — AB (ref 70–99)
POTASSIUM: 3.4 mmol/L — AB (ref 3.5–5.1)
Sodium: 136 mmol/L (ref 135–145)

## 2018-06-03 LAB — TROPONIN I
Troponin I: <0.03 ng/mL (ref <0.03)
Troponin I: <0.03 ng/mL (ref <0.03)

## 2018-06-03 LAB — CBC
HEMATOCRIT: 42.7 % (ref 39.0–52.0)
HEMOGLOBIN: 14.6 g/dL (ref 13.0–17.0)
MCH: 31.8 pg (ref 26.0–34.0)
MCHC: 34.2 g/dL (ref 30.0–36.0)
MCV: 93 fL (ref 78.0–100.0)
Platelets: 179 10*3/uL (ref 150–400)
RBC: 4.59 MIL/uL (ref 4.22–5.81)
RDW: 12.5 % (ref 11.5–15.5)
WBC: 5.9 10*3/uL (ref 4.0–10.5)

## 2018-06-03 MED ORDER — POTASSIUM CHLORIDE CRYS ER 20 MEQ PO TBCR
40.0000 meq | EXTENDED_RELEASE_TABLET | Freq: Once | ORAL | Status: AC
  Administered 2018-06-03: 40 meq via ORAL
  Filled 2018-06-03: qty 2

## 2018-06-03 MED ORDER — HEPARIN (PORCINE) IN NACL 100-0.45 UNIT/ML-% IJ SOLN
1200.0000 [IU]/h | INTRAMUSCULAR | Status: DC
  Administered 2018-06-03 – 2018-06-06 (×5): 1200 [IU]/h via INTRAVENOUS
  Filled 2018-06-03 (×5): qty 250

## 2018-06-03 MED ORDER — MORPHINE SULFATE (PF) 2 MG/ML IV SOLN: 1.0000 mg | INTRAVENOUS | Status: DC | PRN

## 2018-06-03 MED ORDER — ASPIRIN EC 325 MG PO TBEC: 325.0000 mg | DELAYED_RELEASE_TABLET | Freq: Every day | ORAL | Status: DC

## 2018-06-03 MED ORDER — NITROGLYCERIN 0.4 MG SL SUBL
0.4000 mg | SUBLINGUAL_TABLET | SUBLINGUAL | Status: DC | PRN
  Administered 2018-06-03: 0.4 mg via SUBLINGUAL
  Filled 2018-06-03: qty 1

## 2018-06-03 MED ORDER — ONDANSETRON HCL 4 MG/2ML IJ SOLN: 4.0000 mg | Freq: Four times a day (QID) | INTRAMUSCULAR | Status: DC | PRN

## 2018-06-03 MED ORDER — ONDANSETRON HCL 4 MG PO TABS: 4.0000 mg | ORAL_TABLET | Freq: Four times a day (QID) | ORAL | Status: DC | PRN

## 2018-06-03 MED ORDER — SODIUM CHLORIDE 0.9 % IV SOLN: 250.0000 mL | INTRAVENOUS | Status: DC | PRN

## 2018-06-03 MED ORDER — ASPIRIN 81 MG PO CHEW: 324.0000 mg | CHEWABLE_TABLET | Freq: Once | ORAL | Status: DC

## 2018-06-03 MED ORDER — LEVOTHYROXINE SODIUM 100 MCG PO TABS
100.0000 ug | ORAL_TABLET | Freq: Every day | ORAL | Status: DC
  Administered 2018-06-04 – 2018-06-06 (×3): 100 ug via ORAL
  Filled 2018-06-03 (×3): qty 1

## 2018-06-03 MED ORDER — HEPARIN BOLUS VIA INFUSION
4000.0000 [IU] | Freq: Once | INTRAVENOUS | Status: AC
  Administered 2018-06-03: 4000 [IU] via INTRAVENOUS

## 2018-06-03 MED ORDER — ACETAMINOPHEN 325 MG PO TABS: 650.0000 mg | ORAL_TABLET | Freq: Four times a day (QID) | ORAL | Status: DC | PRN

## 2018-06-03 MED ORDER — PANTOPRAZOLE SODIUM 40 MG PO TBEC
40.0000 mg | DELAYED_RELEASE_TABLET | Freq: Every day | ORAL | Status: DC
  Administered 2018-06-04 – 2018-06-07 (×4): 40 mg via ORAL
  Filled 2018-06-03 (×4): qty 1

## 2018-06-03 MED ORDER — SODIUM CHLORIDE 0.9% FLUSH
3.0000 mL | Freq: Two times a day (BID) | INTRAVENOUS | Status: DC
  Administered 2018-06-03 – 2018-06-06 (×5): 3 mL via INTRAVENOUS

## 2018-06-03 MED ORDER — ACETAMINOPHEN 650 MG RE SUPP: 650.0000 mg | Freq: Four times a day (QID) | RECTAL | Status: DC | PRN

## 2018-06-03 MED ORDER — ATORVASTATIN CALCIUM 80 MG PO TABS
80.0000 mg | ORAL_TABLET | Freq: Every evening | ORAL | Status: DC
Start: 2018-06-03 — End: 2018-06-07
  Administered 2018-06-03 – 2018-06-06 (×4): 80 mg via ORAL
  Filled 2018-06-03: qty 2
  Filled 2018-06-03 (×2): qty 1
  Filled 2018-06-03: qty 2

## 2018-06-03 MED ORDER — TAMSULOSIN HCL 0.4 MG PO CAPS
0.4000 mg | ORAL_CAPSULE | Freq: Every day | ORAL | Status: DC
  Administered 2018-06-03 – 2018-06-06 (×4): 0.4 mg via ORAL
  Filled 2018-06-03 (×6): qty 1

## 2018-06-03 MED ORDER — NITROGLYCERIN 0.4 MG SL SUBL: 0.4000 mg | SUBLINGUAL_TABLET | SUBLINGUAL | Status: DC | PRN

## 2018-06-03 MED ORDER — LOSARTAN POTASSIUM 50 MG PO TABS: 50.0000 mg | ORAL_TABLET | Freq: Every morning | ORAL | Status: DC

## 2018-06-03 MED ORDER — METOPROLOL SUCCINATE ER 25 MG PO TB24
12.5000 mg | ORAL_TABLET | Freq: Every day | ORAL | Status: DC
  Administered 2018-06-04 – 2018-06-07 (×4): 12.5 mg via ORAL
  Filled 2018-06-03 (×4): qty 1

## 2018-06-03 MED ORDER — SODIUM CHLORIDE 0.9% FLUSH: 3.0000 mL | INTRAVENOUS | Status: DC | PRN

## 2018-06-03 MED ORDER — ALPRAZOLAM 0.25 MG PO TABS
0.2500 mg | ORAL_TABLET | Freq: Every evening | ORAL | Status: DC | PRN
  Administered 2018-06-03 – 2018-06-06 (×3): 0.25 mg via ORAL
  Filled 2018-06-03 (×3): qty 1

## 2018-06-03 MED ORDER — NITROGLYCERIN 0.4 MG SL SUBL
0.4000 mg | SUBLINGUAL_TABLET | SUBLINGUAL | Status: AC | PRN
  Administered 2018-06-03 (×3): 0.4 mg via SUBLINGUAL
  Filled 2018-06-03: qty 1

## 2018-06-03 NOTE — H&P (Addendum)
History and Physical    Mark [name] ZOX:[id number] DOB: 1951-05-22 DOA: 06/03/2018  PCP: Babs SciaraLuking, Scott A, MD   Patient coming from: Home  Chief Complaint: Chest pain  HPI: Mark [name] is a 67 y.o. male with medical history significant for CAD with prior CABG in 2005, hypertension, dyslipidemia, hypothyroidism, OSA with home CPAP, GERD, and insomnia who presented to the ED this evening with substernal chest pain that began at approximately 5 PM while he was taking his daughter home on a 4 wheeler.  He describes the discomfort as a sense of heaviness with radiation to his anterior neck.  He had some associated dyspnea as well which was worsened when he was walking, but better with rest.  He initially thought that this might have been indigestion and took some antacids without relief and initially rated his pain level 8/10 on arrival. He denies any palpitations, diaphoresis, lower extremity edema, lightheadedness, dizziness, nausea, vomiting, fever, chills, or cough. He has not had any cardiology follow-up over the last 2 to 3 years for any reason.   ED Course: Vital signs stable with EKG sinus rhythm at 51 bpm and no acute changes.  Laboratory data with potassium 3.4 and troponin less than 0.03.  Two-view chest x-ray with no acute findings noted.  He has received full dose aspirin as well as 2 nitroglycerin with pain level currently 3/10.  He states overall he is feeling much better and is currently hungry.  Review of Systems: All others reviewed and otherwise negative.  Past Medical History:  Diagnosis Date  . Coronary artery disease   . Dyslipidemia   . Esophageal stricture   . Gastrointestinal bleeding   . GERD (gastroesophageal reflux disease)   . Hyperlipidemia   . Hypothyroidism     Past Surgical History:  Procedure Laterality Date  . APPENDECTOMY    . bilateral inguinal hernia repair    . CARDIAC CATHETERIZATION  12/2005, 06/2005  . COLONOSCOPY    . CORONARY ARTERY  BYPASS GRAFT  2006   LIMA to LAD, off pump  . OPEN REDUCTION INTERNAL FIXATION (ORIF) HAND Left 07/21/2015   Procedure: IRRIGATION AND DEBRIDMENT LEFT INDEX AND MIDDLE FINGER, OPEN REDUCTION INTERNAL FIXATION LEFT INDEX FINGER.;  Surgeon: Dominica SeverinWilliam Gramig, MD;  Location: [facility] OR;  Service: Orthopedics;  Laterality: Left;     reports that he quit smoking about 38 years ago. He has quit using smokeless tobacco. He reports that he drinks alcohol. He reports that he does not use drugs.  Allergies  Allergen Reactions  . Meloxicam Rash    Family History  Problem Relation Age of Onset  . Heart disease Father   . Hypertension Father     Prior to Admission medications   Medication Sig Start Date End Date Taking? Authorizing Provider  albuterol (PROVENTIL HFA;VENTOLIN HFA) 108 (90 Base) MCG/ACT inhaler TAKE 2 PUFFS BY MOUTH EVERY 6 HOURS AS NEEDED FOR WHEEZE OR SHORTNESS OF BREATH 12/21/17   Babs SciaraLuking, Scott A, MD  ALPRAZolam Prudy Feeler(XANAX) 0.25 MG tablet TAKE 1 TABLET AT BEDTIME AS NEEDED 01/21/18   Babs SciaraLuking, Scott A, MD  aspirin 81 MG tablet 81 mg daily.     [provider]  atorvastatin (LIPITOR) 80 MG tablet TAKE 1 TABLET (80 MG TOTAL) BY MOUTH DAILY. 03/04/18   Babs SciaraLuking, Scott A, MD  ciprofloxacin (CIPRO) 500 MG tablet Take 1 tablet (500 mg total) by mouth 2 (two) times daily. 06/02/18   Babs SciaraLuking, Scott A, MD  levothyroxine (SYNTHROID, LEVOTHROID) 100 MCG  tablet Take 1 tablet (100 mcg total) by mouth daily. 03/04/18   Babs Sciara, MD  losartan (COZAAR) 50 MG tablet Take one tablet daily. 02/22/18   Babs Sciara, MD  metoprolol succinate (TOPROL-XL) 25 MG 24 hr tablet 1/2 tablet daily 03/04/18   Babs Sciara, MD  mupirocin ointment (BACTROBAN) 2 % Apply to affected area once times daily to nostril 06/02/18 06/02/19  Luking, Jonna Coup, MD  naproxen (NAPROSYN) 500 MG tablet Take 500 mg by mouth 2 (two) times daily with a meal.    [provider]  omeprazole (PRILOSEC) 20 MG capsule Take 1 capsule (20  mg total) by mouth daily. 05/18/18   Babs Sciara, MD  tamsulosin (FLOMAX) 0.4 MG CAPS capsule Take 1 capsule (0.4 mg total) by mouth daily. 06/02/18   Babs Sciara, MD  triamcinolone cream (KENALOG) 0.1 % Apply 1 application topically 2 (two) times daily. 03/04/18   Babs Sciara, MD  pravastatin (PRAVACHOL) 80 MG tablet Take 80 mg by mouth daily.    06/02/18  [provider]    Physical Exam: Vitals:   06/03/18 1945 06/03/18 2000 06/03/18 2015 06/03/18 2100  BP:  (!) 148/88  129/81  Pulse: (!) 54 (!) 50 (!) 56 69  Resp: 16 15 13 15   Temp:      TempSrc:      SpO2: 93% 97% 95% 97%  Weight:      Height:        Constitutional: NAD, calm, comfortable Vitals:   06/03/18 1945 06/03/18 2000 06/03/18 2015 06/03/18 2100  BP:  (!) 148/88  129/81  Pulse: (!) 54 (!) 50 (!) 56 69  Resp: 16 15 13 15   Temp:      TempSrc:      SpO2: 93% 97% 95% 97%  Weight:      Height:       Eyes: lids and conjunctivae normal ENMT: Mucous membranes are moist.  Neck: normal, supple Respiratory: clear to auscultation bilaterally. Normal respiratory effort. No accessory muscle use.  Cardiovascular: Regular rate and rhythm, no murmurs. No extremity edema. Abdomen: no tenderness, no distention. Bowel sounds positive.  Musculoskeletal:  No joint deformity upper and lower extremities.   Skin: no rashes, lesions, ulcers.  Psychiatric: Normal judgment and insight. Alert and oriented x 3. Normal mood.   Labs on Admission: I have personally reviewed following labs and imaging studies  CBC: Recent Labs  Lab 06/03/18 1902  WBC 5.9  HGB 14.6  HCT 42.7  MCV 93.0  PLT 179   Basic Metabolic Panel: Recent Labs  Lab 06/03/18 1902  NA 136  K 3.4*  CL 102  CO2 26  GLUCOSE 105*  BUN 18  CREATININE 0.94  CALCIUM 9.3   GFR: Estimated Creatinine Clearance: 87.2 mL/min (by C-G formula based on SCr of 0.94 mg/dL). Liver Function Tests: No results for input(s): AST, ALT, ALKPHOS, BILITOT, PROT,  ALBUMIN in the last 168 hours. No results for input(s): LIPASE, AMYLASE in the last 168 hours. No results for input(s): AMMONIA in the last 168 hours. Coagulation Profile: No results for input(s): INR, PROTIME in the last 168 hours. Cardiac Enzymes: Recent Labs  Lab 06/03/18 1902  TROPONINI <0.03   BNP (last 3 results) No results for input(s): PROBNP in the last 8760 hours. HbA1C: No results for input(s): HGBA1C in the last 72 hours. CBG: No results for input(s): GLUCAP in the last 168 hours. Lipid Profile: No results for input(s): CHOL, HDL,  LDLCALC, TRIG, CHOLHDL, LDLDIRECT in the last 72 hours. Thyroid Function Tests: No results for input(s): TSH, T4TOTAL, FREET4, T3FREE, THYROIDAB in the last 72 hours. Anemia Panel: No results for input(s): VITAMINB12, FOLATE, FERRITIN, TIBC, IRON, RETICCTPCT in the last 72 hours. Urine analysis:    Component Value Date/Time   BILIRUBINUR + 02/03/2014 1426   UROBILINOGEN 2.0 02/03/2014 1426    Radiological Exams on Admission: Dg Chest 2 View  Result Date: 06/03/2018 CLINICAL DATA:  Mid chest pain today. EXAM: CHEST - 2 VIEW COMPARISON:  07/24/2005 CXR FINDINGS: Heart is top-normal in size. There is mild aortic atherosclerosis with slight uncoiling of the thoracic aorta. Status post CABG with median sternotomy sutures in place. The lungs are clear. Resected appearance of the distal right clavicle, new since prior. IMPRESSION: 1. Redemonstration of post CABG change. 2. No active pulmonary disease. 3. Resected distal right clavicle. Electronically Signed   By: Tollie Eth M.D.   On: 06/03/2018 18:40    EKG: Independently reviewed. NSR 51bpm.  Assessment/Plan Principal Problem:   Chest pain Active Problems:   Hypothyroidism   Hyperlipidemia   CORONARY ATHEROSCLEROSIS NATIVE CORONARY ARTERY   ESOPHAGITIS   Essential hypertension, benign   Obstructive sleep apnea    1. Chest pain with concern for unstable angina.  Continue to trend  troponins and repeat EKG in a.m. with telemetry monitoring in stepdown unit.  Maintain on heparin drip since he appears to have some concerning symptoms and previous history.  No changes currently noted on EKG.  Cardiology consultation in a.m.  Obtain 2D echocardiogram.  Maintain on full dose aspirin as well as metoprolol.  Nitroglycerin as needed for chest pain with consideration of nitroglycerin drip should his pain not be well controlled.  N.p.o. after midnight. 2. Mild hypokalemia.  Replete orally and check magnesium in a.m. 3. History of daily alcohol use.  Patient apparently drinks 2 beers on a daily basis and did have some earlier today.  Monitor for signs of withdrawal which are currently not present. 4. Dyslipidemia.  Continue atorvastatin. 5. Hypertension.  Continue metoprolol as well as losartan. 6. Hypothyroidism.  Continue levothyroxine. 7. GERD.  PPI. 8. OSA.  Maintain on home CPAP. 9. Insomnia.  Xanax as needed for sleep. 10. BPH.  Continue Flomax.   DVT prophylaxis: Heparin drip Code Status: Full Family Communication: 2 daughters and wife at the bedside Disposition Plan:ACS evaluation Consults called:Cardiology Admission status: Inpatient, SDU   Pratik Hoover Brunette DO [facility] Pager [phone]  If 7PM-7AM, please contact night-coverage [email or web address] Password [facility]  06/03/2018, 9:12 PM

## 2018-06-03 NOTE — ED Triage Notes (Signed)
CP in center of that started today

## 2018-06-03 NOTE — ED Provider Notes (Signed)
[facility] EMERGENCY DEPARTMENT Provider Note   CSN: [id number] Arrival date & time: 06/03/18  1802     History   Chief Complaint Chief Complaint  Patient presents with  . Chest Pain    HPI Mark [name] is a 67 y.o. male.  HPI  67 year old male presents with chest pain.  Chest pain started around 5 PM today.  He was taking his daughter home on a 4 wheeler when he started to develop chest heaviness in the middle of his chest.  He is also having concomitant anterior neck pain.  He thought it might be indigestion and took some antacids without relief.  The pain is progressively worsening and is about an 8 out of 10.  He did not notice worsening when walking but felt like he was unable to talk due to dyspnea.  No back pain or abdominal pain.  No sweating.  He has a history of CABG in 2005.  He states he did have some anterior neck discomfort during that time as well.  Past Medical History:  Diagnosis Date  . Coronary artery disease   . Dyslipidemia   . Esophageal stricture   . Gastrointestinal bleeding   . GERD (gastroesophageal reflux disease)   . Hyperlipidemia   . Hypothyroidism     Patient Active Problem List   Diagnosis Date Noted  . Chest pain 06/03/2018  . History of colonic polyps 08/26/2014  . Obstructive sleep apnea 07/11/2013  . Vitiligo 07/11/2013  . Essential hypertension, benign 04/06/2013  . CORONARY ATHEROSCLEROSIS NATIVE CORONARY ARTERY 11/20/2009  . Hypothyroidism 12/18/2008  . Hyperlipidemia 12/18/2008  . ESOPHAGITIS 12/18/2008  . ESOPHAGEAL STRICTURE 12/18/2008  . RECTAL BLEEDING 12/18/2008    Past Surgical History:  Procedure Laterality Date  . APPENDECTOMY    . bilateral inguinal hernia repair    . CARDIAC CATHETERIZATION  12/2005, 06/2005  . COLONOSCOPY    . CORONARY ARTERY BYPASS GRAFT  2006   LIMA to LAD, off pump  . OPEN REDUCTION INTERNAL FIXATION (ORIF) HAND Left 07/21/2015   Procedure: IRRIGATION AND DEBRIDMENT LEFT INDEX AND  MIDDLE FINGER, OPEN REDUCTION INTERNAL FIXATION LEFT INDEX FINGER.;  Surgeon: Dominica Severin, MD;  Location: [facility] OR;  Service: Orthopedics;  Laterality: Left;        Home Medications    Prior to Admission medications   Medication Sig Start Date End Date Taking? Authorizing Provider  albuterol (PROVENTIL HFA;VENTOLIN HFA) 108 (90 Base) MCG/ACT inhaler TAKE 2 PUFFS BY MOUTH EVERY 6 HOURS AS NEEDED FOR WHEEZE OR SHORTNESS OF BREATH 12/21/17   Babs Sciara, MD  ALPRAZolam Prudy Feeler) 0.25 MG tablet TAKE 1 TABLET AT BEDTIME AS NEEDED 01/21/18   Babs Sciara, MD  aspirin 81 MG tablet 81 mg daily.     [provider]  atorvastatin (LIPITOR) 80 MG tablet TAKE 1 TABLET (80 MG TOTAL) BY MOUTH DAILY. 03/04/18   Babs Sciara, MD  ciprofloxacin (CIPRO) 500 MG tablet Take 1 tablet (500 mg total) by mouth 2 (two) times daily. 06/02/18   Babs Sciara, MD  levothyroxine (SYNTHROID, LEVOTHROID) 100 MCG tablet Take 1 tablet (100 mcg total) by mouth daily. 03/04/18   Babs Sciara, MD  losartan (COZAAR) 50 MG tablet Take one tablet daily. 02/22/18   Babs Sciara, MD  metoprolol succinate (TOPROL-XL) 25 MG 24 hr tablet 1/2 tablet daily 03/04/18   Babs Sciara, MD  mupirocin ointment (BACTROBAN) 2 % Apply to affected area once times daily to  nostril 06/02/18 06/02/19  Babs Sciara, MD  naproxen (NAPROSYN) 500 MG tablet Take 500 mg by mouth 2 (two) times daily with a meal.    [provider]  omeprazole (PRILOSEC) 20 MG capsule Take 1 capsule (20 mg total) by mouth daily. 05/18/18   Babs Sciara, MD  tamsulosin (FLOMAX) 0.4 MG CAPS capsule Take 1 capsule (0.4 mg total) by mouth daily. 06/02/18   Babs Sciara, MD  triamcinolone cream (KENALOG) 0.1 % Apply 1 application topically 2 (two) times daily. 03/04/18   Babs Sciara, MD  pravastatin (PRAVACHOL) 80 MG tablet Take 80 mg by mouth daily.    06/02/18  [provider]    Family History Family History  Problem Relation Age of  Onset  . Heart disease Father   . Hypertension Father     Social History Social History   Tobacco Use  . Smoking status: Former Smoker    Last attempt to quit: 07/31/1979    Years since quitting: 38.8  . Smokeless tobacco: Former Engineer, water Use Topics  . Alcohol use: Yes    Comment: occassional  . Drug use: No     Allergies   Meloxicam   Review of Systems Review of Systems  Constitutional: Negative for diaphoresis.  Respiratory: Positive for shortness of breath.   Cardiovascular: Positive for chest pain. Negative for leg swelling.  Gastrointestinal: Negative for abdominal pain, nausea and vomiting.  Musculoskeletal: Negative for back pain.  Neurological: Negative for headaches.  All other systems reviewed and are negative.    Physical Exam Updated Vital Signs BP 129/81   Pulse 69   Temp 98 F (36.7 C)   Resp 15   Ht 5\' 10"  (1.778 m)   Wt 92.5 kg (204 lb)   SpO2 97%   BMI 29.27 kg/m   Physical Exam  Constitutional: He is oriented to person, place, and time. He appears well-developed and well-nourished. No distress.  HENT:  Head: Normocephalic and atraumatic.  Right Ear: External ear normal.  Left Ear: External ear normal.  Nose: Nose normal.  Eyes: Right eye exhibits no discharge. Left eye exhibits no discharge.  Neck: Neck supple.  Cardiovascular: Normal rate, regular rhythm and normal heart sounds.  Pulses:      Radial pulses are 2+ on the right side, and 2+ on the left side.  Pulmonary/Chest: Effort normal and breath sounds normal. He exhibits no tenderness.  Abdominal: Soft. He exhibits no distension. There is no tenderness.  Musculoskeletal: He exhibits no edema.  Neurological: He is alert and oriented to person, place, and time.  Skin: Skin is warm and dry. He is not diaphoretic.  Nursing note and vitals reviewed.    ED Treatments / Results  Labs (all labs ordered are listed, but only abnormal results are displayed) Labs Reviewed    BASIC METABOLIC PANEL - Abnormal; Notable for the following components:      Result Value   Potassium 3.4 (*)    Glucose, Bld 105 (*)    All other components within normal limits  CBC  TROPONIN I    EKG EKG Interpretation  Date/Time:  Thursday June 03 2018 18:02:38 EDT Ventricular Rate:  60 PR Interval:  170 QRS Duration: 88 QT Interval:  408 QTC Calculation: 408 R Axis:   -5 Text Interpretation:  Normal sinus rhythm no acute ST/T changes no significant change since 2006 Confirmed by Pricilla Loveless [phone]) on 06/03/2018 6:50:25 PM  ED ECG REPORT #2  Date: 06/03/2018  Rate: 51  Rhythm: sinus bradycardia  QRS Axis: normal  Intervals: normal  ST/T Wave abnormalities: normal  Conduction Disutrbances:none  Narrative Interpretation:   Old EKG Reviewed: unchanged  I have personally reviewed the EKG tracing and agree with the computerized printout as noted.    Radiology Dg Chest 2 View  Result Date: 06/03/2018 CLINICAL DATA:  Mid chest pain today. EXAM: CHEST - 2 VIEW COMPARISON:  07/24/2005 CXR FINDINGS: Heart is top-normal in size. There is mild aortic atherosclerosis with slight uncoiling of the thoracic aorta. Status post CABG with median sternotomy sutures in place. The lungs are clear. Resected appearance of the distal right clavicle, new since prior. IMPRESSION: 1. Redemonstration of post CABG change. 2. No active pulmonary disease. 3. Resected distal right clavicle. Electronically Signed   By: Tollie Ethavid  Kwon M.D.   On: 06/03/2018 18:40    Procedures Procedures (including critical care time)  Medications Ordered in ED Medications  aspirin chewable tablet 324 mg (324 mg Oral Not Given 06/03/18 1900)  nitroGLYCERIN (NITROSTAT) SL tablet 0.4 mg (0.4 mg Sublingual Given 06/03/18 2052)  nitroGLYCERIN (NITROSTAT) SL tablet 0.4 mg (0.4 mg Sublingual Given 06/03/18 1929)     Initial Impression / Assessment and Plan / ED Course  I have reviewed the triage vital signs and the  nursing notes.  Pertinent labs & imaging results that were available during my care of the patient were reviewed by me and considered in my medical decision making (see chart for details).     Patient's chest pain is improving with nitroglycerin.  He was given aspirin.  Quite hypertensive on arrival although this is improving.  His pain went from an 8 down to a 3.  ECG is not significantly changing and initial troponin is negative.  However he is obvious a higher risk and will need ACS rule out.  Hospitalist to admit.  While he is having some pain with significant hypertension I do not think dissection is likely given moderate pain, no diaphoresis, and no severe or radiating pain to the back.  Final Clinical Impressions(s) / ED Diagnoses   Final diagnoses:  Chest pain at rest    ED Discharge Orders    None       Pricilla LovelessGoldston, Kaylen Motl, MD 06/03/18 2113

## 2018-06-03 NOTE — ED Notes (Signed)
Pt states the nitroglycerin made it easier to breathe.

## 2018-06-03 NOTE — Progress Notes (Signed)
ANTICOAGULATION CONSULT NOTE - Initial Consult  Pharmacy Consult for heparin Indication: ACS/STEMI  Allergies  Allergen Reactions  . Meloxicam Rash    Patient Measurements: Height: 5\' 10"  (177.8 cm) Weight: 204 lb (92.5 kg) IBW/kg (Calculated) : 73 Heparin Dosing Weight: 92 kg  Vital Signs: Temp: 98 F (36.7 C) (07/18 1808) Temp Source: Oral (07/18 1807) BP: 129/81 (07/18 2100) Pulse Rate: 69 (07/18 2100)  Labs: Recent Labs    06/03/18 1902  HGB 14.6  HCT 42.7  PLT 179  CREATININE 0.94  TROPONINI <0.03    Estimated Creatinine Clearance: 87.2 mL/min (by C-G formula based on SCr of 0.94 mg/dL).   Medical History: Past Medical History:  Diagnosis Date  . Coronary artery disease   . Dyslipidemia   . Esophageal stricture   . Gastrointestinal bleeding   . GERD (gastroesophageal reflux disease)   . Hyperlipidemia   . Hypothyroidism     Medications:   (Not in a hospital admission)  Assessment: Pharmacy consulted to dose heparin for patient with unstable angina/chest pain. Goal of Therapy:  Heparin level 0.3-0.7 units/ml Monitor platelets by anticoagulation protocol: Yes   Plan:  Give 4000 units bolus x 1 Start heparin infusion at 1200 units/hr Check anti-Xa level in 6 hours and daily while on heparin Continue to monitor H&H and platelets  Tad MooreSteven C Ralphael Southgate 06/03/2018,10:04 PM

## 2018-06-04 ENCOUNTER — Other Ambulatory Visit: Payer: Self-pay

## 2018-06-04 ENCOUNTER — Encounter (HOSPITAL_COMMUNITY): Payer: Self-pay | Admitting: Physician Assistant

## 2018-06-04 ENCOUNTER — Inpatient Hospital Stay (HOSPITAL_BASED_OUTPATIENT_CLINIC_OR_DEPARTMENT_OTHER): Payer: Medicare Other

## 2018-06-04 DIAGNOSIS — G4733 Obstructive sleep apnea (adult) (pediatric): Secondary | ICD-10-CM

## 2018-06-04 DIAGNOSIS — I10 Essential (primary) hypertension: Secondary | ICD-10-CM

## 2018-06-04 DIAGNOSIS — K209 Esophagitis, unspecified: Secondary | ICD-10-CM | POA: Diagnosis not present

## 2018-06-04 DIAGNOSIS — R079 Chest pain, unspecified: Secondary | ICD-10-CM | POA: Diagnosis not present

## 2018-06-04 DIAGNOSIS — E7849 Other hyperlipidemia: Secondary | ICD-10-CM

## 2018-06-04 DIAGNOSIS — I25119 Atherosclerotic heart disease of native coronary artery with unspecified angina pectoris: Secondary | ICD-10-CM | POA: Diagnosis not present

## 2018-06-04 DIAGNOSIS — E038 Other specified hypothyroidism: Secondary | ICD-10-CM

## 2018-06-04 LAB — NM MYOCAR MULTI W/SPECT W/WALL MOTION / EF
CHL CUP NUCLEAR SRS: 0
CHL CUP RESTING HR STRESS: 53 {beats}/min
CSEPPHR: 83 {beats}/min
LV sys vol: 32 mL
LVDIAVOL: 90 mL (ref 62–150)
NUC STRESS TID: 1.09
RATE: 0.39
SDS: 5
SSS: 5

## 2018-06-04 LAB — ECHOCARDIOGRAM COMPLETE
HEIGHTINCHES: 71 in
Weight: 3213.42 oz

## 2018-06-04 LAB — CBC
HEMATOCRIT: 39.7 % (ref 39.0–52.0)
Hemoglobin: 13.4 g/dL (ref 13.0–17.0)
MCH: 31.5 pg (ref 26.0–34.0)
MCHC: 33.8 g/dL (ref 30.0–36.0)
MCV: 93.4 fL (ref 78.0–100.0)
Platelets: 164 10*3/uL (ref 150–400)
RBC: 4.25 MIL/uL (ref 4.22–5.81)
RDW: 12.6 % (ref 11.5–15.5)
WBC: 5.8 10*3/uL (ref 4.0–10.5)

## 2018-06-04 LAB — BASIC METABOLIC PANEL
Anion gap: 8 (ref 5–15)
BUN: 18 mg/dL (ref 8–23)
CALCIUM: 9 mg/dL (ref 8.9–10.3)
CO2: 25 mmol/L (ref 22–32)
Chloride: 107 mmol/L (ref 98–111)
Creatinine, Ser: 0.83 mg/dL (ref 0.61–1.24)
GFR calc Af Amer: >60 mL/min (ref >60)
GLUCOSE: 136 mg/dL — AB (ref 70–99)
POTASSIUM: 3.7 mmol/L (ref 3.5–5.1)
Sodium: 140 mmol/L (ref 135–145)

## 2018-06-04 LAB — MAGNESIUM: Magnesium: 2.1 mg/dL (ref 1.7–2.4)

## 2018-06-04 LAB — TROPONIN I: Troponin I: <0.03 ng/mL (ref <0.03)

## 2018-06-04 LAB — HEPARIN LEVEL (UNFRACTIONATED): HEPARIN UNFRACTIONATED: 0.6 [IU]/mL (ref 0.30–0.70)

## 2018-06-04 LAB — MRSA PCR SCREENING: MRSA by PCR: NEGATIVE

## 2018-06-04 MED ORDER — SODIUM CHLORIDE 0.9% FLUSH: 3.0000 mL | Freq: Two times a day (BID) | INTRAVENOUS | Status: DC

## 2018-06-04 MED ORDER — SODIUM CHLORIDE 0.9 % IV SOLN: INTRAVENOUS | Status: DC

## 2018-06-04 MED ORDER — SODIUM CHLORIDE 0.9% FLUSH: 3.0000 mL | INTRAVENOUS | Status: DC | PRN

## 2018-06-04 MED ORDER — TECHNETIUM TC 99M TETROFOSMIN IV KIT
30.0000 | PACK | Freq: Once | INTRAVENOUS | Status: AC | PRN
  Administered 2018-06-04: 31 via INTRAVENOUS

## 2018-06-04 MED ORDER — ASPIRIN 81 MG PO CHEW: 81.0000 mg | CHEWABLE_TABLET | ORAL | Status: DC

## 2018-06-04 MED ORDER — LOSARTAN POTASSIUM 50 MG PO TABS
100.0000 mg | ORAL_TABLET | Freq: Every morning | ORAL | Status: DC
  Administered 2018-06-04 – 2018-06-07 (×4): 100 mg via ORAL
  Filled 2018-06-04 (×4): qty 2

## 2018-06-04 MED ORDER — SODIUM CHLORIDE 0.9 % IV SOLN: 250.0000 mL | INTRAVENOUS | Status: DC | PRN

## 2018-06-04 MED ORDER — TECHNETIUM TC 99M TETROFOSMIN IV KIT
10.0000 | PACK | Freq: Once | INTRAVENOUS | Status: AC | PRN
  Administered 2018-06-04: 10 via INTRAVENOUS

## 2018-06-04 MED ORDER — ASPIRIN EC 81 MG PO TBEC
81.0000 mg | DELAYED_RELEASE_TABLET | Freq: Every day | ORAL | Status: DC
  Administered 2018-06-04 – 2018-06-06 (×3): 81 mg via ORAL
  Filled 2018-06-04 (×4): qty 1

## 2018-06-04 MED ORDER — SODIUM CHLORIDE 0.9% FLUSH
INTRAVENOUS | Status: AC
  Administered 2018-06-04: 10 mL via INTRAVENOUS
  Filled 2018-06-04: qty 10

## 2018-06-04 MED ORDER — REGADENOSON 0.4 MG/5ML IV SOLN
INTRAVENOUS | Status: AC
  Administered 2018-06-04: 0.4 mg via INTRAVENOUS
  Filled 2018-06-04: qty 5

## 2018-06-04 NOTE — Care Management CC44 (Signed)
Condition Code 44 Documentation Completed  Patient Details  Name: Mark [name] MRN: [id number] Date of Birth: [birth year]   Condition Code 44 given:  Yes Patient signature on Condition Code 44 notice:  Yes Documentation of 2 MD's agreement:  Yes Code 44 added to claim:  Yes    Kaizen Ibsen, Chrystine OilerSharley Diane, RN 06/04/2018, 2:10 PM

## 2018-06-04 NOTE — Progress Notes (Signed)
Pt going down to Nuc Med

## 2018-06-04 NOTE — Consult Note (Addendum)
Cardiology Consultation:   Patient ID: ALLISON SILVA; [id number]; [birth year]   Admit date: 06/03/2018 Date of Consult: 06/04/2018  Primary Care Provider: Babs Sciara, MD Primary Cardiologist: Tonny Bollman, MD  Chief Complaint: chest pain  Patient Profile:   KOURTNEY MONTESINOS is a 67 y.o. male with a hx of CAD s/p CABGx1 2005 (LIMA-LAD), HTN, dyslipidemia, esophageal stricture (s/p remote dilation by Dr. Juanda Chance) hypothyroidism, OSA on CPAP, GERD, urinary retention on Flomax, insomnia who is being seen today for the evaluation of chest pain at the request of Dr. Laural Benes.  History of Present Illness:   In general Mr. Rushlow has done well since his CABG. He underwent lat cath in 2012 showed patent LIMA-LAD, minor nonobstructive disease the left circumflex and right coronary arteries with normal LV function. He was last seen in 2016 for dull chest discomfort with radiation to his throat without specific exertional component. Nuclear stress test showed small, mild, fixed defect in the inferior basal wall consistent with thinning; no ischemia; EF 61% and normal wall motion. F/u 1 year was suggested but he's been lost to follow-up since that time as he's done well.  He walks 2 miles a day and has been doing so without exertional angina. He has been working with primary care on some right hip pain possibly felt to be sciatica (does not happen every time with exertion). Yesterday he was in [place] riding a 4 wheeler across his property back to his house and developed substernal chest pressure radiating to his throat. This was associated with a little bit of SOB. No clamminess, n/v. He went inside the house, sat in recliner, checked BP which seemed higher than normal. He rested but sx persisted despite taking Tums so came to the hospital. He received SL NTG without significant change in symptoms. The pain continued from 5pm, constant, until it dissipated sometime during the middle of the night.  When they came to draw blood at 4am, he does not recall having any further pain. He's felt fine since then. He also reports possibly a sensation of food getting stuck but this has not been severe. Labs relatively benign, troponins all negative, EKG similar to prior. TSH wnl 02/2018. BP elevated up to 180/113 at times.  Past Medical History:  Diagnosis Date  . Coronary artery disease    a. s/p CABG 2005.  Marland Kitchen Dyslipidemia   . Esophageal stricture   . Gastrointestinal bleeding   . GERD (gastroesophageal reflux disease)   . Hyperlipidemia   . Hypothyroidism   . OSA (obstructive sleep apnea)   . Urinary retention     Past Surgical History:  Procedure Laterality Date  . APPENDECTOMY    . bilateral inguinal hernia repair    . CARDIAC CATHETERIZATION  12/2005, 06/2005  . COLONOSCOPY    . CORONARY ARTERY BYPASS GRAFT  2006   LIMA to LAD, off pump  . OPEN REDUCTION INTERNAL FIXATION (ORIF) HAND Left 07/21/2015   Procedure: IRRIGATION AND DEBRIDMENT LEFT INDEX AND MIDDLE FINGER, OPEN REDUCTION INTERNAL FIXATION LEFT INDEX FINGER.;  Surgeon: Dominica Severin, MD;  Location: [facility] OR;  Service: Orthopedics;  Laterality: Left;     Inpatient Medications: Scheduled Meds: . aspirin EC  81 mg Oral Daily  . atorvastatin  80 mg Oral QPM  . levothyroxine  100 mcg Oral QAC breakfast  . losartan  100 mg Oral q morning - 10a  . metoprolol succinate  12.5 mg Oral Daily  . pantoprazole  40 mg Oral Daily  .  sodium chloride flush  3 mL Intravenous Q12H  . tamsulosin  0.4 mg Oral Daily   Continuous Infusions: . sodium chloride    . heparin 1,200 Units/hr (06/03/18 2250)   PRN Meds: sodium chloride, acetaminophen **OR** acetaminophen, ALPRAZolam, morphine injection, nitroGLYCERIN, ondansetron **OR** ondansetron (ZOFRAN) IV, sodium chloride flush  Home Meds: Prior to Admission medications   Medication Sig Start Date End Date Taking? Authorizing Provider  ALPRAZolam (XANAX) 0.25 MG tablet TAKE 1 TABLET AT  BEDTIME AS NEEDED Patient taking differently: TAKE 0.5-1 TABLET AT BEDTIME AS NEEDED FOR SLEEP 01/21/18  Yes Babs SciaraLuking, Scott A, MD  aspirin 81 MG tablet Take 81 mg by mouth every morning.    Yes [provider]  atorvastatin (LIPITOR) 80 MG tablet TAKE 1 TABLET (80 MG TOTAL) BY MOUTH DAILY. Patient taking differently: Take 80 mg by mouth every evening.  03/04/18  Yes Babs SciaraLuking, Scott A, MD  ciprofloxacin (CIPRO) 500 MG tablet Take 1 tablet (500 mg total) by mouth 2 (two) times daily. 06/02/18  Yes Luking, Jonna CoupScott A, MD  levothyroxine (SYNTHROID, LEVOTHROID) 100 MCG tablet Take 1 tablet (100 mcg total) by mouth daily. 03/04/18  Yes Babs SciaraLuking, Scott A, MD  losartan (COZAAR) 50 MG tablet Take one tablet daily. Patient taking differently: Take 50 mg by mouth every morning. Take one tablet daily. 02/22/18  Yes Babs SciaraLuking, Scott A, MD  metoprolol succinate (TOPROL-XL) 25 MG 24 hr tablet 1/2 tablet daily Patient taking differently: Take 12.5 mg by mouth every morning.  03/04/18  Yes Babs SciaraLuking, Scott A, MD  mupirocin ointment (BACTROBAN) 2 % Apply to affected area once times daily to nostril Patient taking differently: Apply 1 application topically See admin instructions. Apply to affected area once times daily to nostril 06/02/18 06/02/19 Yes Luking, Jonna CoupScott A, MD  naproxen (NAPROSYN) 500 MG tablet Take 500 mg by mouth 2 (two) times daily with a meal.   Yes [provider]  omeprazole (PRILOSEC) 20 MG capsule Take 1 capsule (20 mg total) by mouth daily. 05/18/18  Yes Babs SciaraLuking, Scott A, MD  tamsulosin (FLOMAX) 0.4 MG CAPS capsule Take 1 capsule (0.4 mg total) by mouth daily. 06/02/18  Yes Babs SciaraLuking, Scott A, MD  triamcinolone cream (KENALOG) 0.1 % Apply 1 application topically 2 (two) times daily. Patient taking differently: Apply 1 application topically 2 (two) times daily as needed (irritation).  03/04/18  Yes Luking, Jonna CoupScott A, MD  albuterol (PROVENTIL HFA;VENTOLIN HFA) 108 (90 Base) MCG/ACT inhaler TAKE 2 PUFFS BY MOUTH  EVERY 6 HOURS AS NEEDED FOR WHEEZE OR SHORTNESS OF BREATH Patient not taking: Reported on 06/03/2018 12/21/17   Babs SciaraLuking, Scott A, MD  pravastatin (PRAVACHOL) 80 MG tablet Take 80 mg by mouth daily.    06/02/18  [provider]    Allergies:    Allergies  Allergen Reactions  . Meloxicam Rash    Social History:   Social History   Socioeconomic History  . Marital status: Married    Spouse name: Not on file  . Number of children: Not on file  . Years of education: Not on file  . Highest education level: Not on file  Occupational History  . Occupation: Engineer, agriculturalelectrican    Employer: [employer]  Social Needs  . Financial resource strain: Not on file  . Food insecurity:    Worry: Not on file    Inability: Not on file  . Transportation needs:    Medical: Not on file    Non-medical: Not on file  Tobacco Use  .  Smoking status: Former Smoker    Last attempt to quit: 07/31/1979    Years since quitting: 38.8  . Smokeless tobacco: Former Engineer, water and Sexual Activity  . Alcohol use: Yes    Comment: occassional  . Drug use: No  . Sexual activity: Not on file  Lifestyle  . Physical activity:    Days per week: Not on file    Minutes per session: Not on file  . Stress: Not on file  Relationships  . Social connections:    Talks on phone: Not on file    Gets together: Not on file    Attends religious service: Not on file    Active member of club or organization: Not on file    Attends meetings of clubs or organizations: Not on file    Relationship status: Not on file  . Intimate partner violence:    Fear of current or ex partner: Not on file    Emotionally abused: Not on file    Physically abused: Not on file    Forced sexual activity: Not on file  Other Topics Concern  . Not on file  Social History Narrative  . Not on file    Family History:   The patient's family history includes Heart disease in his father; Hypertension in his father.  ROS:  Please see the  history of present illness.  No bleeding, weight gain, chills. All other ROS reviewed and negative.     Physical Exam/Data:   Vitals:   06/04/18 0600 06/04/18 0700 06/04/18 0800 06/04/18 0801  BP: (!) 129/92 (!) 142/90 (!) 152/96   Pulse: (!) 56 62 (!) 53 (!) 54  Resp: 13 14 17 17   Temp:    (!) 97.4 F (36.3 C)  TempSrc:    Oral  SpO2: 98% 97% 97% 96%  Weight:      Height:        Intake/Output Summary (Last 24 hours) at 06/04/2018 0833 Last data filed at 06/04/2018 0249 Gross per 24 hour  Intake -  Output 825 ml  Net -825 ml   Filed Weights   06/03/18 1807 06/03/18 2246 06/04/18 0500  Weight: 204 lb (92.5 kg) 200 lb 13.4 oz (91.1 kg) 200 lb 13.4 oz (91.1 kg)   Body mass index is 28.01 kg/m.  General: Well developed, well nourished WM, in no acute distress. Head: Normocephalic, atraumatic, sclera non-icteric, no xanthomas, nares are without discharge.  Neck: Negative for carotid bruits. JVD not elevated. Lungs: Clear bilaterally to auscultation without wheezes, rales, or rhonchi. Breathing is unlabored. Heart: RRR with S1 S2. No murmurs, rubs, or gallops appreciated. Abdomen: Soft, non-tender, non-distended with normoactive bowel sounds. No hepatomegaly. No rebound/guarding. No obvious abdominal masses. Msk:  Strength and tone appear normal for age. Extremities: No clubbing or cyanosis. No edema.  Distal pedal pulses are 2+ and equal bilaterally. Neuro: Alert and oriented X 3. No facial asymmetry. No focal deficit. Moves all extremities spontaneously. Psych:  Responds to questions appropriately with a normal affect, soft spoken  EKG:  The EKG was personally reviewed and demonstrates NSR 60bpm nonspecific upsloped ST segments similar to prior.  Relevant CV Studies: As above  Laboratory Data:  Chemistry Recent Labs  Lab 06/03/18 1902 06/04/18 0350  NA 136 140  K 3.4* 3.7  CL 102 107  CO2 26 25  GLUCOSE 105* 136*  BUN 18 18  CREATININE 0.94 0.83  CALCIUM 9.3 9.0   GFRNONAA >60 >60  GFRAA >60 >60  ANIONGAP 8 8    No results for input(s): PROT, ALBUMIN, AST, ALT, ALKPHOS, BILITOT in the last 168 hours. Hematology Recent Labs  Lab 06/03/18 1902 06/04/18 0350  WBC 5.9 5.8  RBC 4.59 4.25  HGB 14.6 13.4  HCT 42.7 39.7  MCV 93.0 93.4  MCH 31.8 31.5  MCHC 34.2 33.8  RDW 12.5 12.6  PLT 179 164   Cardiac Enzymes Recent Labs  Lab 06/03/18 1902 06/03/18 2143 06/04/18 0350  TROPONINI <0.03 <0.03 <0.03   No results for input(s): TROPIPOC in the last 168 hours.  BNPNo results for input(s): BNP, PROBNP in the last 168 hours.  DDimer No results for input(s): DDIMER in the last 168 hours.  Radiology/Studies:  Dg Chest 2 View  Result Date: 06/03/2018 CLINICAL DATA:  Mid chest pain today. EXAM: CHEST - 2 VIEW COMPARISON:  07/24/2005 CXR FINDINGS: Heart is top-normal in size. There is mild aortic atherosclerosis with slight uncoiling of the thoracic aorta. Status post CABG with median sternotomy sutures in place. The lungs are clear. Resected appearance of the distal right clavicle, new since prior. IMPRESSION: 1. Redemonstration of post CABG change. 2. No active pulmonary disease. 3. Resected distal right clavicle. Electronically Signed   By: Tollie Eth M.D.   On: 06/03/2018 18:40    Assessment and Plan:   1. Chest pain - quality of pain is somewhat concerning for angina. It reminds him of prior anginal pain. Also had in 2016 per Dr. Earmon Phoenix notes when nuc was nonischemic. Despite at least 4-6 hours of pain, EKG is nonacute and troponins negative. Likely needs improved BP control. I will discuss ischemic eval with Dr. Wyline Mood. Primary team has him on heparin for the time being.  2. Known CAD - no need for ASA 325, will reduce back to home dose 81mg  daily. Continue BB (HR precludes titration), statin.  3. HTN with elevated BP - will titrate losartan to 100mg  daily. Goal SBP given hx would be closer to 120-130.  4. Hyperlipidemia - continue  statin.   For questions or updates, please contact [facility] Please consult [email or web address] for contact info under Cardiology/STEMI.    Signed, Laurann Montana, PA-C  06/04/2018 8:33 AM  Attending note   Patient seen and discussed with PA Dunn, I agree with her documentation. 67 yo male history of CAD with prior CABG with LIMA-LAD, HL, hypothyroidism. Presents with chest pain.  Reports episode started yesterday around 5pm while riding 4 wheeler. Pain bilateral chest, 7-8/10 in severity with some SOB. Waited at home about 20 minutes without resolution, came to ER. In ER somewhat better with NG. From beginning to end reports constant chest pain lasting about 10 hours though varied in severity.     ER vitals 184/115 99% RA p 63 Stress 06/04/18 with small mild fixed defect in inferior basal wall, no ischemia. Exercised 10 minutes without ischemic changes on EKG, but reported symptoms during stress.  K 3.4 Cr 0.94 WBC 5.9 Hgb 14.6 Plt 179  Trop neg x 3 EKG SR, no ischemic changes Echo pending   Atypical symptoms, primarily in the fact that they lasted about 10 hours constant though some variability in severity. No objective evidence of ishcemia by EKG or enzymes. We will plan for a lexiscan to day to further evaluate given his prior CAD history. Agree with increasing losartan for better bp control.    Dina Rich MD

## 2018-06-04 NOTE — Progress Notes (Signed)
Received word daughter brought patient food - cath r/s to Monday. Pre-cath orders written under sign in held, for NPO & IVF to begin on Monday AM. Transfer orders written and emtala filled out. Rosann Auerbachrish will assist with [facility]. Shelli Portilla PA-C

## 2018-06-04 NOTE — Progress Notes (Signed)
Pt has home NIV unit, will placed self on once ready for bed.

## 2018-06-04 NOTE — Progress Notes (Signed)
Intermediate risk stress test. Discussed with results with patient and plans for cath. He ate lunch at 2pm, will have to schedule cath for Monday. [facility] and cath Monday.    Dominga FerryJ Kember Boch MD

## 2018-06-04 NOTE — Progress Notes (Signed)
PROGRESS NOTE    Mark [name]  ZOX:[id number]  DOB: July 29, 1951  DOA: 06/03/2018 PCP: Babs SciaraLuking, Scott A, MD   Brief Admission Hx: Mark [name] is a 67 y.o. male with medical history significant for CAD with prior CABG in 2005, hypertension, dyslipidemia, hypothyroidism, OSA with home CPAP, GERD, and insomnia who presented to the ED this evening with substernal chest pain that began at approximately 5 PM while he was taking his daughter home on a 4 wheeler.  He describes the discomfort as a sense of heaviness with radiation to his anterior neck. He has not had any cardiology follow-up over the last 2 to 3 years.  MDM/Assessment & Plan:   1. Atypical chest pain - Troponins have been negative.  Cardiology has seen him and planning for a lexiscan today.  Echocardiogram pending.   2. Uncontrolled hypertension - his losartan dose has been increased for better blood pressure control.   3. Hypokalemia - repleted.  4. Dyslipidemia - continue atorvastatin daily.  5. Essential hypertension - continue metoprolol and losartan with increased dose for better control.  Following.  6. GERD - continue PPI for GI protection.  7. OSA - he is on CPAP.  8. Insomnia - he takes alprazolam for sleep.  9. BPH - continue flomax.    DVT prophylaxis: heparin  Code Status: full  Family Communication: patient  Disposition Plan: pending inpatient work up and stress testing   Consultants:  cardiology  Subjective: Pt is reporting that when he woke up this morning all of his chest pressure and pain had resolved.   Objective: Vitals:   06/04/18 0600 06/04/18 0700 06/04/18 0800 06/04/18 0801  BP: (!) 129/92 (!) 142/90 (!) 152/96   Pulse: (!) 56 62 (!) 53 (!) 54  Resp: 13 14 17 17   Temp:    (!) 97.4 F (36.3 C)  TempSrc:    Oral  SpO2: 98% 97% 97% 96%  Weight:      Height:        Intake/Output Summary (Last 24 hours) at 06/04/2018 0902 Last data filed at 06/04/2018 0249 Gross per 24 hour    Intake -  Output 825 ml  Net -825 ml   Filed Weights   06/03/18 1807 06/03/18 2246 06/04/18 0500  Weight: 92.5 kg (204 lb) 91.1 kg (200 lb 13.4 oz) 91.1 kg (200 lb 13.4 oz)     REVIEW OF SYSTEMS  As per history otherwise all reviewed and reported negative  Exam:  General exam: awake, alert, NAD, cooperative.   Respiratory system: Clear. No increased work of breathing. Cardiovascular system: S1 & S2 heard, RRR.  Gastrointestinal system: Abdomen is nondistended, soft and nontender. Normal bowel sounds heard. Central nervous system: Alert and oriented. No focal neurological deficits. Extremities: no CCE.  Data Reviewed: Basic Metabolic Panel: Recent Labs  Lab 06/03/18 1902 06/04/18 0350  NA 136 140  K 3.4* 3.7  CL 102 107  CO2 26 25  GLUCOSE 105* 136*  BUN 18 18  CREATININE 0.94 0.83  CALCIUM 9.3 9.0  MG  --  2.1   Liver Function Tests: No results for input(s): AST, ALT, ALKPHOS, BILITOT, PROT, ALBUMIN in the last 168 hours. No results for input(s): LIPASE, AMYLASE in the last 168 hours. No results for input(s): AMMONIA in the last 168 hours. CBC: Recent Labs  Lab 06/03/18 1902 06/04/18 0350  WBC 5.9 5.8  HGB 14.6 13.4  HCT 42.7 39.7  MCV 93.0 93.4  PLT 179 164  Cardiac Enzymes: Recent Labs  Lab 06/03/18 1902 06/03/18 2143 06/04/18 0350  TROPONINI <0.03 <0.03 <0.03   CBG (last 3)  No results for input(s): GLUCAP in the last 72 hours. Recent Results (from the past 240 hour(s))  MRSA PCR Screening     Status: None   Collection Time: 06/03/18 10:38 PM  Result Value Ref Range Status   MRSA by PCR NEGATIVE NEGATIVE Final    Comment:        The GeneXpert MRSA Assay (FDA approved for NASAL specimens only), is one component of a comprehensive MRSA colonization surveillance program. It is not intended to diagnose MRSA infection nor to guide or monitor treatment for MRSA infections. Performed at [facility], [address], [place], [place]  [address]      Studies: Dg Chest 2 View  Result Date: 06/03/2018 CLINICAL DATA:  Mid chest pain today. EXAM: CHEST - 2 VIEW COMPARISON:  07/24/2005 CXR FINDINGS: Heart is top-normal in size. There is mild aortic atherosclerosis with slight uncoiling of the thoracic aorta. Status post CABG with median sternotomy sutures in place. The lungs are clear. Resected appearance of the distal right clavicle, new since prior. IMPRESSION: 1. Redemonstration of post CABG change. 2. No active pulmonary disease. 3. Resected distal right clavicle. Electronically Signed   By: Tollie Eth M.D.   On: 06/03/2018 18:40     Scheduled Meds: . aspirin EC  81 mg Oral Daily  . atorvastatin  80 mg Oral QPM  . levothyroxine  100 mcg Oral QAC breakfast  . losartan  100 mg Oral q morning - 10a  . metoprolol succinate  12.5 mg Oral Daily  . pantoprazole  40 mg Oral Daily  . sodium chloride flush  3 mL Intravenous Q12H  . tamsulosin  0.4 mg Oral Daily   Continuous Infusions: . sodium chloride    . heparin 1,200 Units/hr (06/03/18 2250)    Principal Problem:   Chest pain Active Problems:   Hypothyroidism   Hyperlipidemia   CORONARY ATHEROSCLEROSIS NATIVE CORONARY ARTERY   ESOPHAGITIS   Essential hypertension, benign   Obstructive sleep apnea  Time spent:   Standley Dakins, MD, FAAFP [facility] Pager [phone]  If 7PM-7AM, please contact night-coverage [email or web address] Password [email or web address] 06/04/2018, 9:02 AM    LOS: 1 day

## 2018-06-04 NOTE — Care Management Obs Status (Signed)
MEDICARE OBSERVATION STATUS NOTIFICATION   Patient Details  Name: Mark [name] MRN: [id number] Date of Birth: 23-Sep-1951   Medicare Observation Status Notification Given:  Yes    Quade Ramirez, Chrystine OilerSharley Diane, RN 06/04/2018, 2:10 PM

## 2018-06-04 NOTE — Progress Notes (Signed)
ANTICOAGULATION CONSULT NOTE - Initial Consult  Pharmacy Consult for heparin Indication: ACS/STEMI  Allergies  Allergen Reactions  . Meloxicam Rash    Patient Measurements: Height: 5\' 11"  (180.3 cm) Weight: 200 lb 13.4 oz (91.1 kg) IBW/kg (Calculated) : 75.3 Heparin Dosing Weight: 92 kg  Vital Signs: Temp: 97.8 F (36.6 C) (07/19 0400) Temp Source: Axillary (07/19 0400) BP: 132/90 (07/19 0530) Pulse Rate: 58 (07/19 0530)  Labs: Recent Labs    06/03/18 1902 06/03/18 2143 06/04/18 0350 06/04/18 0425  HGB 14.6  --  13.4  --   HCT 42.7  --  39.7  --   PLT 179  --  164  --   HEPARINUNFRC  --   --   --  0.60  CREATININE 0.94  --  0.83  --   TROPONINI <0.03 <0.03 <0.03  --     Estimated Creatinine Clearance: 99.7 mL/min (by C-G formula based on SCr of 0.83 mg/dL).   Medical History: Past Medical History:  Diagnosis Date  . Coronary artery disease   . Dyslipidemia   . Esophageal stricture   . Gastrointestinal bleeding   . GERD (gastroesophageal reflux disease)   . Hyperlipidemia   . Hypothyroidism     Medications:  Medications Prior to Admission  Medication Sig Dispense Refill Last Dose  . ALPRAZolam (XANAX) 0.25 MG tablet TAKE 1 TABLET AT BEDTIME AS NEEDED (Patient taking differently: TAKE 0.5-1 TABLET AT BEDTIME AS NEEDED FOR SLEEP) 30 tablet 4 06/02/2018 at Unknown time  . aspirin 81 MG tablet Take 81 mg by mouth every morning.    06/03/2018 at Unknown time  . atorvastatin (LIPITOR) 80 MG tablet TAKE 1 TABLET (80 MG TOTAL) BY MOUTH DAILY. (Patient taking differently: Take 80 mg by mouth every evening. ) 90 tablet 1 06/02/2018 at Unknown time  . ciprofloxacin (CIPRO) 500 MG tablet Take 1 tablet (500 mg total) by mouth 2 (two) times daily. 42 tablet 0 06/03/2018 at Unknown time  . levothyroxine (SYNTHROID, LEVOTHROID) 100 MCG tablet Take 1 tablet (100 mcg total) by mouth daily. 90 tablet 1 06/03/2018 at Unknown time  . losartan (COZAAR) 50 MG tablet Take one tablet  daily. (Patient taking differently: Take 50 mg by mouth every morning. Take one tablet daily.) 90 tablet 1 06/03/2018 at Unknown time  . metoprolol succinate (TOPROL-XL) 25 MG 24 hr tablet 1/2 tablet daily (Patient taking differently: Take 12.5 mg by mouth every morning. ) 45 tablet 3 06/03/2018 at 800A  . mupirocin ointment (BACTROBAN) 2 % Apply to affected area once times daily to nostril (Patient taking differently: Apply 1 application topically See admin instructions. Apply to affected area once times daily to nostril) 22 g 0 06/02/2018 at Unknown time  . naproxen (NAPROSYN) 500 MG tablet Take 500 mg by mouth 2 (two) times daily with a meal.   06/03/2018 at Unknown time  . omeprazole (PRILOSEC) 20 MG capsule Take 1 capsule (20 mg total) by mouth daily. 90 capsule 1 06/03/2018 at Unknown time  . tamsulosin (FLOMAX) 0.4 MG CAPS capsule Take 1 capsule (0.4 mg total) by mouth daily. 30 capsule 0 06/02/2018 at Unknown time  . triamcinolone cream (KENALOG) 0.1 % Apply 1 application topically 2 (two) times daily. (Patient taking differently: Apply 1 application topically 2 (two) times daily as needed (irritation). ) 45 g 4 unknown  . albuterol (PROVENTIL HFA;VENTOLIN HFA) 108 (90 Base) MCG/ACT inhaler TAKE 2 PUFFS BY MOUTH EVERY 6 HOURS AS NEEDED FOR WHEEZE OR SHORTNESS OF BREATH (  Patient not taking: Reported on 06/03/2018) 1 Inhaler 6 unknown    Assessment: Pharmacy consulted to dose heparin for patient with unstable angina/chest pain. Heparin level therapeutic at 0.60.  Goal of Therapy:  Heparin level 0.3-0.7 units/ml Monitor platelets by anticoagulation protocol: Yes   Plan:  Continue heparin infusion at 1200 units/hr Check anti-Xa level daily while on heparin Continue to monitor H&H and platelets    Judeth Cornfield, PharmD Clinical Pharmacist 06/04/2018 7:56 AM

## 2018-06-04 NOTE — Progress Notes (Signed)
*  PRELIMINARY RESULTS* Echocardiogram 2D Echocardiogram has been performed.  Jeryl Columbialliott, Abass Misener 06/04/2018, 1:47 PM

## 2018-06-04 NOTE — Progress Notes (Deleted)
Error, wrong note type chosen.

## 2018-06-04 NOTE — Progress Notes (Signed)
Pt transferred off the floor with [facility] to [facility].

## 2018-06-04 NOTE — Progress Notes (Signed)
Report given to [place] at [facility] on 6E. Pt made aware [facility] is on the way.

## 2018-06-05 DIAGNOSIS — G4700 Insomnia, unspecified: Secondary | ICD-10-CM | POA: Diagnosis not present

## 2018-06-05 DIAGNOSIS — I10 Essential (primary) hypertension: Secondary | ICD-10-CM | POA: Diagnosis not present

## 2018-06-05 DIAGNOSIS — M542 Cervicalgia: Secondary | ICD-10-CM | POA: Diagnosis not present

## 2018-06-05 DIAGNOSIS — E785 Hyperlipidemia, unspecified: Secondary | ICD-10-CM | POA: Diagnosis not present

## 2018-06-05 DIAGNOSIS — E039 Hypothyroidism, unspecified: Secondary | ICD-10-CM | POA: Diagnosis not present

## 2018-06-05 DIAGNOSIS — R079 Chest pain, unspecified: Secondary | ICD-10-CM | POA: Diagnosis present

## 2018-06-05 DIAGNOSIS — E876 Hypokalemia: Secondary | ICD-10-CM | POA: Diagnosis not present

## 2018-06-05 DIAGNOSIS — G4733 Obstructive sleep apnea (adult) (pediatric): Secondary | ICD-10-CM | POA: Diagnosis not present

## 2018-06-05 DIAGNOSIS — Z951 Presence of aortocoronary bypass graft: Secondary | ICD-10-CM | POA: Diagnosis not present

## 2018-06-05 DIAGNOSIS — Z79890 Hormone replacement therapy (postmenopausal): Secondary | ICD-10-CM | POA: Diagnosis not present

## 2018-06-05 DIAGNOSIS — I2510 Atherosclerotic heart disease of native coronary artery without angina pectoris: Secondary | ICD-10-CM | POA: Diagnosis not present

## 2018-06-05 DIAGNOSIS — Z87891 Personal history of nicotine dependence: Secondary | ICD-10-CM | POA: Diagnosis not present

## 2018-06-05 DIAGNOSIS — R0789 Other chest pain: Secondary | ICD-10-CM | POA: Diagnosis not present

## 2018-06-05 DIAGNOSIS — N400 Enlarged prostate without lower urinary tract symptoms: Secondary | ICD-10-CM | POA: Diagnosis not present

## 2018-06-05 DIAGNOSIS — Z8249 Family history of ischemic heart disease and other diseases of the circulatory system: Secondary | ICD-10-CM | POA: Diagnosis not present

## 2018-06-05 DIAGNOSIS — Z7982 Long term (current) use of aspirin: Secondary | ICD-10-CM | POA: Diagnosis not present

## 2018-06-05 DIAGNOSIS — K210 Gastro-esophageal reflux disease with esophagitis: Secondary | ICD-10-CM | POA: Diagnosis not present

## 2018-06-05 LAB — CBC
HCT: 41.9 % (ref 39.0–52.0)
Hemoglobin: 13.7 g/dL (ref 13.0–17.0)
MCH: 31.1 pg (ref 26.0–34.0)
MCHC: 32.7 g/dL (ref 30.0–36.0)
MCV: 95 fL (ref 78.0–100.0)
PLATELETS: 150 10*3/uL (ref 150–400)
RBC: 4.41 MIL/uL (ref 4.22–5.81)
RDW: 12.2 % (ref 11.5–15.5)
WBC: 3.8 10*3/uL — AB (ref 4.0–10.5)

## 2018-06-05 LAB — HIV ANTIBODY (ROUTINE TESTING W REFLEX): HIV Screen 4th Generation wRfx: NONREACTIVE

## 2018-06-05 LAB — HEPARIN LEVEL (UNFRACTIONATED): Heparin Unfractionated: 0.64 IU/mL (ref 0.30–0.70)

## 2018-06-05 NOTE — Care Management (Signed)
[id number] 06-05-18 Pt presented for chest pain as a [facility]. PTA Independent from home. No needs identified. Gala LewandowskyGraves-Bigelow, Jazir Newey Kaye, RN,BSN Case Manager [phone]

## 2018-06-05 NOTE — Progress Notes (Addendum)
ANTICOAGULATION CONSULT NOTE - Initial Consult  Pharmacy Consult for heparin Indication: ACS/STEMI  Allergies  Allergen Reactions  . Meloxicam Rash    Patient Measurements: Height: 5\' 11"  (180.3 cm) Weight: 200 lb 9.6 oz (91 kg) IBW/kg (Calculated) : 75.3 Heparin Dosing Weight: 92 kg  Vital Signs: Temp: 97.8 F (36.6 C) (07/20 0605) Temp Source: Oral (07/20 0605) BP: 156/89 (07/20 0605) Pulse Rate: 57 (07/20 0605)  Labs: Recent Labs    06/03/18 1902 06/03/18 2143 06/04/18 0350 06/04/18 0425 06/04/18 0937 06/05/18 1053  HGB 14.6  --  13.4  --   --  13.7  HCT 42.7  --  39.7  --   --  41.9  PLT 179  --  164  --   --  150  HEPARINUNFRC  --   --   --  0.60  --  0.64  CREATININE 0.94  --  0.83  --   --   --   TROPONINI <0.03 <0.03 <0.03  --  <0.03  --     Estimated Creatinine Clearance: 99.7 mL/min (by C-G formula based on SCr of 0.83 mg/dL).   Medical History: Past Medical History:  Diagnosis Date  . Coronary artery disease    a. s/p CABG 2005.  Marland Kitchen. Dyslipidemia   . Esophageal stricture   . Gastrointestinal bleeding   . GERD (gastroesophageal reflux disease)   . Hyperlipidemia   . Hypothyroidism   . OSA (obstructive sleep apnea)   . Urinary retention     Medications:  Medications Prior to Admission  Medication Sig Dispense Refill Last Dose  . ALPRAZolam (XANAX) 0.25 MG tablet TAKE 1 TABLET AT BEDTIME AS NEEDED (Patient taking differently: TAKE 0.5-1 TABLET AT BEDTIME AS NEEDED FOR SLEEP) 30 tablet 4 06/02/2018 at Unknown time  . aspirin 81 MG tablet Take 81 mg by mouth every morning.    06/03/2018 at Unknown time  . atorvastatin (LIPITOR) 80 MG tablet TAKE 1 TABLET (80 MG TOTAL) BY MOUTH DAILY. (Patient taking differently: Take 80 mg by mouth every evening. ) 90 tablet 1 06/02/2018 at Unknown time  . ciprofloxacin (CIPRO) 500 MG tablet Take 1 tablet (500 mg total) by mouth 2 (two) times daily. 42 tablet 0 06/03/2018 at Unknown time  . levothyroxine (SYNTHROID,  LEVOTHROID) 100 MCG tablet Take 1 tablet (100 mcg total) by mouth daily. 90 tablet 1 06/03/2018 at Unknown time  . losartan (COZAAR) 50 MG tablet Take one tablet daily. (Patient taking differently: Take 50 mg by mouth every morning. Take one tablet daily.) 90 tablet 1 06/03/2018 at Unknown time  . metoprolol succinate (TOPROL-XL) 25 MG 24 hr tablet 1/2 tablet daily (Patient taking differently: Take 12.5 mg by mouth every morning. ) 45 tablet 3 06/03/2018 at 800A  . mupirocin ointment (BACTROBAN) 2 % Apply to affected area once times daily to nostril (Patient taking differently: Apply 1 application topically See admin instructions. Apply to affected area once times daily to nostril) 22 g 0 06/02/2018 at Unknown time  . naproxen (NAPROSYN) 500 MG tablet Take 500 mg by mouth 2 (two) times daily with a meal.   06/03/2018 at Unknown time  . omeprazole (PRILOSEC) 20 MG capsule Take 1 capsule (20 mg total) by mouth daily. 90 capsule 1 06/03/2018 at Unknown time  . tamsulosin (FLOMAX) 0.4 MG CAPS capsule Take 1 capsule (0.4 mg total) by mouth daily. 30 capsule 0 06/02/2018 at Unknown time  . triamcinolone cream (KENALOG) 0.1 % Apply 1 application topically 2 (two)  times daily. (Patient taking differently: Apply 1 application topically 2 (two) times daily as needed (irritation). ) 45 g 4 unknown  . albuterol (PROVENTIL HFA;VENTOLIN HFA) 108 (90 Base) MCG/ACT inhaler TAKE 2 PUFFS BY MOUTH EVERY 6 HOURS AS NEEDED FOR WHEEZE OR SHORTNESS OF BREATH (Patient not taking: Reported on 06/03/2018) 1 Inhaler 6 unknown    Assessment: Pharmacy consulted to dose heparin for patient with unstable angina/chest pain. Patient transferred to [facility] from [facility]. Pt scheduled for LHC on 7/22.  Heparin level therapeutic this morning at 0.64. No s/sx of bleeding, CBC stable.  Goal of Therapy:  Heparin level 0.3-0.7 units/ml Monitor platelets by anticoagulation protocol: Yes   Plan:  Continue heparin infusion at 1200  units/hr Check anti-Xa level daily while on heparin Continue to monitor CBC  Thank you for involving pharmacy in this patient's care.  Wendelyn Breslow, PharmD PGY1 Pharmacy Resident Phone: [phone] 06/05/2018 12:16 PM

## 2018-06-05 NOTE — Care Management Obs Status (Signed)
MEDICARE OBSERVATION STATUS NOTIFICATION   Patient Details  Name: Mark [name] MRN: [id number] Date of Birth: 1950/12/18   Medicare Observation Status Notification Given:  Yes    Gala LewandowskyGraves-Bigelow, Evoleth Nordmeyer Kaye, RN 06/05/2018, 9:35 AM

## 2018-06-05 NOTE — Progress Notes (Signed)
Progress Note  Patient Name: Mark [name] Date of Encounter: 06/05/2018  Primary Cardiologist: Tonny Bollman, MD   Subjective   No chest pain. He and his wife had many questions today, all answered.  Inpatient Medications    Scheduled Meds: . aspirin EC  81 mg Oral Daily  . atorvastatin  80 mg Oral QPM  . levothyroxine  100 mcg Oral QAC breakfast  . losartan  100 mg Oral q morning - 10a  . metoprolol succinate  12.5 mg Oral Daily  . pantoprazole  40 mg Oral Daily  . sodium chloride flush  3 mL Intravenous Q12H  . tamsulosin  0.4 mg Oral Daily   Continuous Infusions: . sodium chloride    . heparin 1,200 Units/hr (06/04/18 1721)   PRN Meds: sodium chloride, acetaminophen **OR** acetaminophen, ALPRAZolam, morphine injection, nitroGLYCERIN, ondansetron **OR** ondansetron (ZOFRAN) IV, sodium chloride flush   Vital Signs    Vitals:   06/04/18 1900 06/04/18 2051 06/05/18 0605 06/05/18 0605  BP: 118/73 (!) 148/80  (!) 156/89  Pulse: (!) 58 (!) 56  (!) 57  Resp: (!) 21     Temp:  97.8 F (36.6 C)  97.8 F (36.6 C)  TempSrc:  Oral  Oral  SpO2: 99% 98%  98%  Weight:  201 lb 4.8 oz (91.3 kg) 200 lb 9.6 oz (91 kg)   Height:  5\' 11"  (1.803 m)      Intake/Output Summary (Last 24 hours) at 06/05/2018 1247 Last data filed at 06/05/2018 1200 Gross per 24 hour  Intake 228 ml  Output 3225 ml  Net -2997 ml   Filed Weights   06/04/18 0500 06/04/18 2051 06/05/18 0605  Weight: 200 lb 13.4 oz (91.1 kg) 201 lb 4.8 oz (91.3 kg) 200 lb 9.6 oz (91 kg)    Telemetry    NSR - Personally Reviewed  ECG    NSR - Personally Reviewed  Physical Exam   GEN: No acute distress.   Neck: supple, no JVD Cardiac: regular S1 and S2, no murmurs, rubs, or gallops.  Respiratory: Clear to auscultation bilaterally. GI: Soft, nontender, non-distended. Bowel sounds normal MS: No edema; No deformity. Neuro:  Nonfocal, moves all limbs independently Psych: Normal affect   Labs      Chemistry Recent Labs  Lab 06/03/18 1902 06/04/18 0350  NA 136 140  K 3.4* 3.7  CL 102 107  CO2 26 25  GLUCOSE 105* 136*  BUN 18 18  CREATININE 0.94 0.83  CALCIUM 9.3 9.0  GFRNONAA >60 >60  GFRAA >60 >60  ANIONGAP 8 8     Hematology Recent Labs  Lab 06/03/18 1902 06/04/18 0350 06/05/18 1053  WBC 5.9 5.8 3.8*  RBC 4.59 4.25 4.41  HGB 14.6 13.4 13.7  HCT 42.7 39.7 41.9  MCV 93.0 93.4 95.0  MCH 31.8 31.5 31.1  MCHC 34.2 33.8 32.7  RDW 12.5 12.6 12.2  PLT 179 164 150    Cardiac Enzymes Recent Labs  Lab 06/03/18 1902 06/03/18 2143 06/04/18 0350 06/04/18 0937  TROPONINI <0.03 <0.03 <0.03 <0.03   No results for input(s): TROPIPOC in the last 168 hours.   BNPNo results for input(s): BNP, PROBNP in the last 168 hours.   DDimer No results for input(s): DDIMER in the last 168 hours.   Radiology    Dg Chest 2 View  Result Date: 06/03/2018 CLINICAL DATA:  Mid chest pain today. EXAM: CHEST - 2 VIEW COMPARISON:  07/24/2005 CXR FINDINGS: Heart is top-normal in size.  There is mild aortic atherosclerosis with slight uncoiling of the thoracic aorta. Status post CABG with median sternotomy sutures in place. The lungs are clear. Resected appearance of the distal right clavicle, new since prior. IMPRESSION: 1. Redemonstration of post CABG change. 2. No active pulmonary disease. 3. Resected distal right clavicle. Electronically Signed   By: Tollie Ethavid  Kwon M.D.   On: 06/03/2018 18:40   Nm Myocar Multi W/spect W/wall Motion / Ef  Result Date: 06/04/2018  There was no ST segment deviation noted during stress.  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 There is some adjacent gut radiotracer uptake, however the inferior/inferoseptal wall is hypokinetic and the defect is clearly more intense in the post-stress images  The left ventricular ejection fraction is normal (55-65%).  This is an intermediate risk study.     Cardiac  Studies   Echo 06/04/18 - Left ventricle: The cavity size was normal. Wall thickness was   increased in a pattern of mild LVH. Systolic function was normal.   The estimated ejection fraction was in the range of 60% to 65%.   Wall motion was normal; there were no regional wall motion   abnormalities. Left ventricular diastolic function parameters   were normal. - Aortic valve: Mildly calcified annulus. Trileaflet; normal   thickness leaflets. Valve area (VTI): 3.64 cm^2. Valve area   (Vmax): 3.36 cm^2.  Nuclear stress 06/04/18  There was no ST segment deviation noted during stress.  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 There is some adjacent gut radiotracer uptake, however the inferior/inferoseptal wall is hypokinetic and the defect is clearly more intense in the post-stress images  The left ventricular ejection fraction is normal (55-65%).  This is an intermediate risk study.  Patient Profile     67 y.o. male with a hx of CAD s/p CABGx1 2005 (LIMA-LAD), HTN, dyslipidemia, esophageal stricture (s/p remote dilation by Dr. Juanda ChanceBrodie) hypothyroidism, OSA on CPAP, GERD, urinary retention on Flomax, insomnia who presented with chest pain and was found to have an intermediate risk study. Plan for cath on Monday.  Assessment & Plan    Chest pain, CAD history, intermediate risk stress test -Doing well, no current chest pain -Patient and his wife had many questions about his results, what it means for cath, potential outcome of cath, etc. All discussed at length. -troponins negative, ECG unchanged -discussed that he should notify staff if chest pain re-develops  Risks and benefits of cardiac catheterization have been discussed with the patient.  These include bleeding, infection, kidney damage, stroke, heart attack, death.  The patient understands these risks and is willing to proceed.   Time Spent Directly with Patient: I have  spent a total of >35 minutes with the patient reviewing hospital notes, telemetry, EKGs, labs and examining the patient as well as establishing an assessment and plan that was discussed personally with the patient.  > 50% of time was spent in direct patient care.  Length of Stay:  LOS: 1 day   Jodelle RedBridgette Atalia Litzinger, MD, PhD [facility]  [facility]   06/05/2018, 12:47 PM   For questions or updates, please contact [facility] Please consult [email or web address] for contact info under Cardiology/STEMI.

## 2018-06-06 ENCOUNTER — Encounter (HOSPITAL_COMMUNITY): Payer: Self-pay | Admitting: Cardiology

## 2018-06-06 DIAGNOSIS — I25119 Atherosclerotic heart disease of native coronary artery with unspecified angina pectoris: Secondary | ICD-10-CM

## 2018-06-06 DIAGNOSIS — R0789 Other chest pain: Secondary | ICD-10-CM | POA: Diagnosis not present

## 2018-06-06 DIAGNOSIS — R079 Chest pain, unspecified: Secondary | ICD-10-CM | POA: Diagnosis not present

## 2018-06-06 LAB — CBC
HEMATOCRIT: 42.5 % (ref 39.0–52.0)
Hemoglobin: 14.2 g/dL (ref 13.0–17.0)
MCH: 31.7 pg (ref 26.0–34.0)
MCHC: 33.4 g/dL (ref 30.0–36.0)
MCV: 94.9 fL (ref 78.0–100.0)
Platelets: 156 10*3/uL (ref 150–400)
RBC: 4.48 MIL/uL (ref 4.22–5.81)
RDW: 12.3 % (ref 11.5–15.5)
WBC: 5.1 10*3/uL (ref 4.0–10.5)

## 2018-06-06 LAB — HEPARIN LEVEL (UNFRACTIONATED): Heparin Unfractionated: 0.48 IU/mL (ref 0.30–0.70)

## 2018-06-06 MED ORDER — SODIUM CHLORIDE 0.9 % WEIGHT BASED INFUSION: 1.0000 mL/kg/h | INTRAVENOUS | Status: DC

## 2018-06-06 MED ORDER — ASPIRIN 81 MG PO CHEW
81.0000 mg | CHEWABLE_TABLET | ORAL | Status: AC
  Administered 2018-06-07: 81 mg via ORAL
  Filled 2018-06-06: qty 1

## 2018-06-06 MED ORDER — SODIUM CHLORIDE 0.9 % WEIGHT BASED INFUSION
3.0000 mL/kg/h | INTRAVENOUS | Status: DC
  Administered 2018-06-07: 3 mL/kg/h via INTRAVENOUS

## 2018-06-06 MED ORDER — SODIUM CHLORIDE 0.9% FLUSH: 3.0000 mL | INTRAVENOUS | Status: DC | PRN

## 2018-06-06 MED ORDER — SODIUM CHLORIDE 0.9% FLUSH
3.0000 mL | Freq: Two times a day (BID) | INTRAVENOUS | Status: DC
  Administered 2018-06-06: 3 mL via INTRAVENOUS

## 2018-06-06 MED ORDER — SODIUM CHLORIDE 0.9 % IV SOLN: 250.0000 mL | INTRAVENOUS | Status: DC | PRN

## 2018-06-06 NOTE — H&P (View-Only) (Signed)
Progress Note  Patient Name: Mark [name] Date of Encounter: 06/06/2018  Primary Cardiologist: Tonny Bollman, MD   Subjective   No complaints  Inpatient Medications    Scheduled Meds: . aspirin EC  81 mg Oral Daily  . atorvastatin  80 mg Oral QPM  . levothyroxine  100 mcg Oral QAC breakfast  . losartan  100 mg Oral q morning - 10a  . metoprolol succinate  12.5 mg Oral Daily  . pantoprazole  40 mg Oral Daily  . sodium chloride flush  3 mL Intravenous Q12H  . tamsulosin  0.4 mg Oral Daily   Continuous Infusions: . sodium chloride    . heparin 1,200 Units/hr (06/06/18 0618)   PRN Meds: sodium chloride, acetaminophen **OR** acetaminophen, ALPRAZolam, morphine injection, nitroGLYCERIN, ondansetron **OR** ondansetron (ZOFRAN) IV, sodium chloride flush   Vital Signs    Vitals:   06/05/18 0605 06/05/18 1511 06/05/18 2057 06/06/18 0557  BP: (!) 156/89 124/85 (!) 141/79 131/84  Pulse: (!) 57 (!) 57 (!) 54 (!) 52  Resp:      Temp: 97.8 F (36.6 C) 98.8 F (37.1 C) 98.9 F (37.2 C) 97.8 F (36.6 C)  TempSrc: Oral Oral Oral Oral  SpO2: 98% 96% 95% 97%  Weight:    198 lb (89.8 kg)  Height:        Intake/Output Summary (Last 24 hours) at 06/06/2018 0820 Last data filed at 06/06/2018 0654 Gross per 24 hour  Intake 323.8 ml  Output 3450 ml  Net -3126.2 ml   Filed Weights   06/04/18 2051 06/05/18 0605 06/06/18 0557  Weight: 201 lb 4.8 oz (91.3 kg) 200 lb 9.6 oz (91 kg) 198 lb (89.8 kg)    Telemetry    SR - Personally Reviewed  ECG    No new - Personally Reviewed  Physical Exam   GEN: No acute distress.   Neck: No JVD Cardiac: RRR, no murmurs, rubs, or gallops.  Respiratory: Clear to auscultation bilaterally. GI: Soft, nontender, non-distended  MS: No edema; No deformity. Neuro:  Nonfocal  Psych: Normal affect   Labs    Chemistry Recent Labs  Lab 06/03/18 1902 06/04/18 0350  NA 136 140  K 3.4* 3.7  CL 102 107  CO2 26 25  GLUCOSE 105* 136*    BUN 18 18  CREATININE 0.94 0.83  CALCIUM 9.3 9.0  GFRNONAA >60 >60  GFRAA >60 >60  ANIONGAP 8 8     Hematology Recent Labs  Lab 06/04/18 0350 06/05/18 1053 06/06/18 0359  WBC 5.8 3.8* 5.1  RBC 4.25 4.41 4.48  HGB 13.4 13.7 14.2  HCT 39.7 41.9 42.5  MCV 93.4 95.0 94.9  MCH 31.5 31.1 31.7  MCHC 33.8 32.7 33.4  RDW 12.6 12.2 12.3  PLT 164 150 156    Cardiac Enzymes Recent Labs  Lab 06/03/18 1902 06/03/18 2143 06/04/18 0350 06/04/18 0937  TROPONINI <0.03 <0.03 <0.03 <0.03   No results for input(s): TROPIPOC in the last 168 hours.   BNPNo results for input(s): BNP, PROBNP in the last 168 hours.   DDimer No results for input(s): DDIMER in the last 168 hours.   Radiology    Nm Myocar Multi W/spect W/wall Motion / Ef  Result Date: 06/04/2018  There was no ST segment deviation noted during stress.  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 There is some adjacent gut radiotracer uptake, however the inferior/inferoseptal wall is hypokinetic and the defect is clearly more intense in  the post-stress images  The left ventricular ejection fraction is normal (55-65%).  This is an intermediate risk study.     Cardiac Studies   Echo 06/04/18 - Left ventricle: The cavity size was normal. Wall thickness was increased in a pattern of mild LVH. Systolic function was normal. The estimated ejection fraction was in the range of 60% to 65%. Wall motion was normal; there were no regional wall motion abnormalities. Left ventricular diastolic function parameters were normal. - Aortic valve: Mildly calcified annulus. Trileaflet; normal thickness leaflets. Valve area (VTI): 3.64 cm^2. Valve area (Vmax): 3.36 cm^2.  Nuclear stress 06/04/18  There was no ST segment deviation noted during stress.  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 There is some  adjacent gut radiotracer uptake, however the inferior/inferoseptal wall is hypokinetic and the defect is clearly more intense in the post-stress images  The left ventricular ejection fraction is normal (55-65%).  This is an intermediate risk study.    Patient Profile     67 y.o. male with a hx of CAD s/p CABGx1 2005 (LIMA-LAD), HTN, dyslipidemia, esophageal stricture (s/p remote dilation by Dr. Juanda ChanceBrodie) hypothyroidism, OSA on CPAP, GERD, urinary retention on Flomax, insomnia who presented with chest pain and was found to have an intermediate risk study. Plan for cath on Monday.    Assessment & Plan    Chest Pain - hx CAD with CABG and intermediate risk stress test  --no pain--on ASA and IV heparin --cardiac cath for Monday at 0730 with Dr. Herbie BaltimoreHarding- orders are done  Dr. Cristal Deerhristopher has discussed risks of cath with pt and wife.  OSA on CPAP  HLD on lipitor 80   GERD on protonix   HTN on cozaar and toprol BP controlled  For questions or updates, please contact [facility] Please consult [email or web address] for contact info under Cardiology/STEMI.      Signed, Nada BoozerLaura Ingold, NP  06/06/2018, 8:20 AM    Patient seen and examined, agree with note as above. Doing well today, no complaints. Reviewed plans for cath again. Patient understands and has no further questions.  Physical exam unchanged from yesterday. Labs reviewed, tele personally reviewed.  Plan for cath tomorrow. NPO at midnight. On heparin drip and aspirin. BMET, CBC ordered.  TIME SPENT WITH PATIENT: >25 minutes of direct patient care. More than 50% of that time was spent on coordination of care and counseling regarding plans for cath tomorrow.  Jodelle RedBridgette Zhamir Pirro, MD, PhD [facility]  [facility]

## 2018-06-06 NOTE — Progress Notes (Addendum)
Progress Note  Patient Name: JAESON MOLSTAD Date of Encounter: 06/06/2018  Primary Cardiologist: Tonny Bollman, MD   Subjective   No complaints  Inpatient Medications    Scheduled Meds: . aspirin EC  81 mg Oral Daily  . atorvastatin  80 mg Oral QPM  . levothyroxine  100 mcg Oral QAC breakfast  . losartan  100 mg Oral q morning - 10a  . metoprolol succinate  12.5 mg Oral Daily  . pantoprazole  40 mg Oral Daily  . sodium chloride flush  3 mL Intravenous Q12H  . tamsulosin  0.4 mg Oral Daily   Continuous Infusions: . sodium chloride    . heparin 1,200 Units/hr (06/06/18 0618)   PRN Meds: sodium chloride, acetaminophen **OR** acetaminophen, ALPRAZolam, morphine injection, nitroGLYCERIN, ondansetron **OR** ondansetron (ZOFRAN) IV, sodium chloride flush   Vital Signs    Vitals:   06/05/18 0605 06/05/18 1511 06/05/18 2057 06/06/18 0557  BP: (!) 156/89 124/85 (!) 141/79 131/84  Pulse: (!) 57 (!) 57 (!) 54 (!) 52  Resp:      Temp: 97.8 F (36.6 C) 98.8 F (37.1 C) 98.9 F (37.2 C) 97.8 F (36.6 C)  TempSrc: Oral Oral Oral Oral  SpO2: 98% 96% 95% 97%  Weight:    198 lb (89.8 kg)  Height:        Intake/Output Summary (Last 24 hours) at 06/06/2018 0820 Last data filed at 06/06/2018 0654 Gross per 24 hour  Intake 323.8 ml  Output 3450 ml  Net -3126.2 ml   Filed Weights   06/04/18 2051 06/05/18 0605 06/06/18 0557  Weight: 201 lb 4.8 oz (91.3 kg) 200 lb 9.6 oz (91 kg) 198 lb (89.8 kg)    Telemetry    SR - Personally Reviewed  ECG    No new - Personally Reviewed  Physical Exam   GEN: No acute distress.   Neck: No JVD Cardiac: RRR, no murmurs, rubs, or gallops.  Respiratory: Clear to auscultation bilaterally. GI: Soft, nontender, non-distended  MS: No edema; No deformity. Neuro:  Nonfocal  Psych: Normal affect   Labs    Chemistry Recent Labs  Lab 06/03/18 1902 06/04/18 0350  NA 136 140  K 3.4* 3.7  CL 102 107  CO2 26 25  GLUCOSE 105* 136*    BUN 18 18  CREATININE 0.94 0.83  CALCIUM 9.3 9.0  GFRNONAA >60 >60  GFRAA >60 >60  ANIONGAP 8 8     Hematology Recent Labs  Lab 06/04/18 0350 06/05/18 1053 06/06/18 0359  WBC 5.8 3.8* 5.1  RBC 4.25 4.41 4.48  HGB 13.4 13.7 14.2  HCT 39.7 41.9 42.5  MCV 93.4 95.0 94.9  MCH 31.5 31.1 31.7  MCHC 33.8 32.7 33.4  RDW 12.6 12.2 12.3  PLT 164 150 156    Cardiac Enzymes Recent Labs  Lab 06/03/18 1902 06/03/18 2143 06/04/18 0350 06/04/18 0937  TROPONINI <0.03 <0.03 <0.03 <0.03   No results for input(s): TROPIPOC in the last 168 hours.   BNPNo results for input(s): BNP, PROBNP in the last 168 hours.   DDimer No results for input(s): DDIMER in the last 168 hours.   Radiology    Nm Myocar Multi W/spect W/wall Motion / Ef  Result Date: 06/04/2018  There was no ST segment deviation noted during stress.  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 There is some adjacent gut radiotracer uptake, however the inferior/inferoseptal wall is hypokinetic and the defect is clearly more intense in  the post-stress images  The left ventricular ejection fraction is normal (55-65%).  This is an intermediate risk study.     Cardiac Studies   Echo 06/04/18 - Left ventricle: The cavity size was normal. Wall thickness was increased in a pattern of mild LVH. Systolic function was normal. The estimated ejection fraction was in the range of 60% to 65%. Wall motion was normal; there were no regional wall motion abnormalities. Left ventricular diastolic function parameters were normal. - Aortic valve: Mildly calcified annulus. Trileaflet; normal thickness leaflets. Valve area (VTI): 3.64 cm^2. Valve area (Vmax): 3.36 cm^2.  Nuclear stress 06/04/18  There was no ST segment deviation noted during stress.  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 There is some  adjacent gut radiotracer uptake, however the inferior/inferoseptal wall is hypokinetic and the defect is clearly more intense in the post-stress images  The left ventricular ejection fraction is normal (55-65%).  This is an intermediate risk study.    Patient Profile     67 y.o. male with a hx of CAD s/p CABGx1 2005 (LIMA-LAD), HTN, dyslipidemia, esophageal stricture (s/p remote dilation by Dr. Juanda ChanceBrodie) hypothyroidism, OSA on CPAP, GERD, urinary retention on Flomax, insomnia who presented with chest pain and was found to have an intermediate risk study. Plan for cath on Monday.    Assessment & Plan    Chest Pain - hx CAD with CABG and intermediate risk stress test  --no pain--on ASA and IV heparin --cardiac cath for Monday at 0730 with Dr. Herbie BaltimoreHarding- orders are done  Dr. Cristal Deerhristopher has discussed risks of cath with pt and wife.  OSA on CPAP  HLD on lipitor 80   GERD on protonix   HTN on cozaar and toprol BP controlled  For questions or updates, please contact [facility] Please consult [email or web address] for contact info under Cardiology/STEMI.      Signed, Nada BoozerLaura Ingold, NP  06/06/2018, 8:20 AM    Patient seen and examined, agree with note as above. Doing well today, no complaints. Reviewed plans for cath again. Patient understands and has no further questions.  Physical exam unchanged from yesterday. Labs reviewed, tele personally reviewed.  Plan for cath tomorrow. NPO at midnight. On heparin drip and aspirin. BMET, CBC ordered.  TIME SPENT WITH PATIENT: >25 minutes of direct patient care. More than 50% of that time was spent on coordination of care and counseling regarding plans for cath tomorrow.  Jodelle RedBridgette Brynlie Daza, MD, PhD [facility]  [facility]

## 2018-06-06 NOTE — Progress Notes (Signed)
ANTICOAGULATION CONSULT NOTE - Initial Consult  Pharmacy Consult for heparin Indication: ACS/STEMI  Allergies  Allergen Reactions  . Meloxicam Rash    Patient Measurements: Height: 5\' 11"  (180.3 cm) Weight: 198 lb (89.8 kg) IBW/kg (Calculated) : 75.3 Heparin Dosing Weight: 92 kg  Vital Signs: Temp: 97.8 F (36.6 C) (07/21 0557) Temp Source: Oral (07/21 0557) BP: 145/90 (07/21 0900) Pulse Rate: 52 (07/21 0557)  Labs: Recent Labs    06/03/18 1902 06/03/18 2143 06/04/18 0350 06/04/18 0425 06/04/18 0937 06/05/18 1053 06/06/18 0359  HGB 14.6  --  13.4  --   --  13.7 14.2  HCT 42.7  --  39.7  --   --  41.9 42.5  PLT 179  --  164  --   --  150 156  HEPARINUNFRC  --   --   --  0.60  --  0.64 0.48  CREATININE 0.94  --  0.83  --   --   --   --   TROPONINI <0.03 <0.03 <0.03  --  <0.03  --   --     Estimated Creatinine Clearance: 92 mL/min (by C-G formula based on SCr of 0.83 mg/dL).   Medical History: Past Medical History:  Diagnosis Date  . Coronary artery disease    a. s/p CABG 2005.  Marland Kitchen. Dyslipidemia   . Esophageal stricture   . Gastrointestinal bleeding   . GERD (gastroesophageal reflux disease)   . Hyperlipidemia   . Hypothyroidism   . OSA (obstructive sleep apnea)   . Urinary retention     Medications:  Medications Prior to Admission  Medication Sig Dispense Refill Last Dose  . ALPRAZolam (XANAX) 0.25 MG tablet TAKE 1 TABLET AT BEDTIME AS NEEDED (Patient taking differently: TAKE 0.5-1 TABLET AT BEDTIME AS NEEDED FOR SLEEP) 30 tablet 4 06/02/2018 at Unknown time  . aspirin 81 MG tablet Take 81 mg by mouth every morning.    06/03/2018 at Unknown time  . atorvastatin (LIPITOR) 80 MG tablet TAKE 1 TABLET (80 MG TOTAL) BY MOUTH DAILY. (Patient taking differently: Take 80 mg by mouth every evening. ) 90 tablet 1 06/02/2018 at Unknown time  . ciprofloxacin (CIPRO) 500 MG tablet Take 1 tablet (500 mg total) by mouth 2 (two) times daily. 42 tablet 0 06/03/2018 at Unknown  time  . levothyroxine (SYNTHROID, LEVOTHROID) 100 MCG tablet Take 1 tablet (100 mcg total) by mouth daily. 90 tablet 1 06/03/2018 at Unknown time  . losartan (COZAAR) 50 MG tablet Take one tablet daily. (Patient taking differently: Take 50 mg by mouth every morning. Take one tablet daily.) 90 tablet 1 06/03/2018 at Unknown time  . metoprolol succinate (TOPROL-XL) 25 MG 24 hr tablet 1/2 tablet daily (Patient taking differently: Take 12.5 mg by mouth every morning. ) 45 tablet 3 06/03/2018 at 800A  . mupirocin ointment (BACTROBAN) 2 % Apply to affected area once times daily to nostril (Patient taking differently: Apply 1 application topically See admin instructions. Apply to affected area once times daily to nostril) 22 g 0 06/02/2018 at Unknown time  . naproxen (NAPROSYN) 500 MG tablet Take 500 mg by mouth 2 (two) times daily with a meal.   06/03/2018 at Unknown time  . omeprazole (PRILOSEC) 20 MG capsule Take 1 capsule (20 mg total) by mouth daily. 90 capsule 1 06/03/2018 at Unknown time  . tamsulosin (FLOMAX) 0.4 MG CAPS capsule Take 1 capsule (0.4 mg total) by mouth daily. 30 capsule 0 06/02/2018 at Unknown time  . triamcinolone  cream (KENALOG) 0.1 % Apply 1 application topically 2 (two) times daily. (Patient taking differently: Apply 1 application topically 2 (two) times daily as needed (irritation). ) 45 g 4 unknown  . albuterol (PROVENTIL HFA;VENTOLIN HFA) 108 (90 Base) MCG/ACT inhaler TAKE 2 PUFFS BY MOUTH EVERY 6 HOURS AS NEEDED FOR WHEEZE OR SHORTNESS OF BREATH (Patient not taking: Reported on 06/03/2018) 1 Inhaler 6 unknown    Assessment: Pharmacy consulted to dose heparin for patient with unstable angina/chest pain. Patient transferred to [facility] via [facility]. Pt scheduled for [facility] on 7/22.  Heparin level therapeutic this morning at 0.48 No s/sx of bleeding, CBC stable.  Goal of Therapy:  Heparin level 0.3-0.7 units/ml Monitor platelets by anticoagulation protocol: Yes   Plan:  Continue  heparin infusion at 1200 units/hr Check HL daily while on heparin Continue to monitor CBC  Thank you for involving pharmacy in this patient's care.  Wendelyn Breslow, PharmD PGY1 Pharmacy Resident Phone: [phone] 06/06/2018 11:21 AM

## 2018-06-06 NOTE — Progress Notes (Signed)
Pt has home unit and places self on/off. RT will monitor,

## 2018-06-07 ENCOUNTER — Inpatient Hospital Stay (HOSPITAL_COMMUNITY)
Admission: EM | Disposition: A | Discharge status: Home / Self Care | Payer: Self-pay | Source: Home / Self Care | Attending: Cardiology

## 2018-06-07 ENCOUNTER — Encounter (HOSPITAL_COMMUNITY): Payer: Self-pay | Admitting: Cardiology

## 2018-06-07 DIAGNOSIS — E876 Hypokalemia: Secondary | ICD-10-CM | POA: Diagnosis not present

## 2018-06-07 DIAGNOSIS — I209 Angina pectoris, unspecified: Secondary | ICD-10-CM | POA: Diagnosis present

## 2018-06-07 DIAGNOSIS — R0789 Other chest pain: Secondary | ICD-10-CM | POA: Diagnosis not present

## 2018-06-07 DIAGNOSIS — E039 Hypothyroidism, unspecified: Secondary | ICD-10-CM | POA: Diagnosis not present

## 2018-06-07 DIAGNOSIS — I2510 Atherosclerotic heart disease of native coronary artery without angina pectoris: Secondary | ICD-10-CM

## 2018-06-07 DIAGNOSIS — I25119 Atherosclerotic heart disease of native coronary artery with unspecified angina pectoris: Secondary | ICD-10-CM | POA: Diagnosis not present

## 2018-06-07 DIAGNOSIS — R079 Chest pain, unspecified: Secondary | ICD-10-CM | POA: Diagnosis not present

## 2018-06-07 DIAGNOSIS — E785 Hyperlipidemia, unspecified: Secondary | ICD-10-CM | POA: Diagnosis not present

## 2018-06-07 DIAGNOSIS — I208 Other forms of angina pectoris: Secondary | ICD-10-CM

## 2018-06-07 DIAGNOSIS — R9439 Abnormal result of other cardiovascular function study: Secondary | ICD-10-CM | POA: Diagnosis not present

## 2018-06-07 HISTORY — PX: CORONARY PRESSURE/FFR STUDY: CATH118243

## 2018-06-07 HISTORY — PX: INTRAVASCULAR PRESSURE WIRE/FFR STUDY: CATH118243

## 2018-06-07 HISTORY — PX: LEFT HEART CATH AND CORS/GRAFTS ANGIOGRAPHY: CATH118250

## 2018-06-07 LAB — BASIC METABOLIC PANEL
Anion gap: 9 (ref 5–15)
BUN: 13 mg/dL (ref 8–23)
CHLORIDE: 109 mmol/L (ref 98–111)
CO2: 22 mmol/L (ref 22–32)
Calcium: 8.9 mg/dL (ref 8.9–10.3)
Creatinine, Ser: 0.84 mg/dL (ref 0.61–1.24)
GFR calc non Af Amer: >60 mL/min (ref >60)
Glucose, Bld: 116 mg/dL — ABNORMAL HIGH (ref 70–99)
POTASSIUM: 4.1 mmol/L (ref 3.5–5.1)
Sodium: 140 mmol/L (ref 135–145)

## 2018-06-07 LAB — CBC
HCT: 44.6 % (ref 39.0–52.0)
Hemoglobin: 14.6 g/dL (ref 13.0–17.0)
MCH: 31.1 pg (ref 26.0–34.0)
MCHC: 32.7 g/dL (ref 30.0–36.0)
MCV: 95.1 fL (ref 78.0–100.0)
PLATELETS: 149 10*3/uL — AB (ref 150–400)
RBC: 4.69 MIL/uL (ref 4.22–5.81)
RDW: 12.6 % (ref 11.5–15.5)
WBC: 4.9 10*3/uL (ref 4.0–10.5)

## 2018-06-07 SURGERY — LEFT HEART CATH AND CORS/GRAFTS ANGIOGRAPHY
Anesthesia: LOCAL

## 2018-06-07 MED ORDER — VERAPAMIL HCL 2.5 MG/ML IV SOLN
INTRAVENOUS | Status: AC
  Filled 2018-06-07: qty 2

## 2018-06-07 MED ORDER — IOPAMIDOL (ISOVUE-370) INJECTION 76%
INTRAVENOUS | Status: AC
  Filled 2018-06-07: qty 125

## 2018-06-07 MED ORDER — HEPARIN (PORCINE) IN NACL 1000-0.9 UT/500ML-% IV SOLN
INTRAVENOUS | Status: AC
  Filled 2018-06-07: qty 1000

## 2018-06-07 MED ORDER — HEPARIN (PORCINE) IN NACL 2-0.9 UNITS/ML
INTRAMUSCULAR | Status: DC | PRN
  Administered 2018-06-07: 10 mL via INTRA_ARTERIAL

## 2018-06-07 MED ORDER — HEPARIN (PORCINE) IN NACL 1000-0.9 UT/500ML-% IV SOLN
INTRAVENOUS | Status: DC | PRN
  Administered 2018-06-07 (×2): 500 mL

## 2018-06-07 MED ORDER — HEPARIN SODIUM (PORCINE) 1000 UNIT/ML IJ SOLN
INTRAMUSCULAR | Status: AC
  Filled 2018-06-07: qty 1

## 2018-06-07 MED ORDER — MIDAZOLAM HCL 2 MG/2ML IJ SOLN
INTRAMUSCULAR | Status: DC | PRN
  Administered 2018-06-07: 1 mg via INTRAVENOUS

## 2018-06-07 MED ORDER — SODIUM CHLORIDE 0.9% FLUSH: 3.0000 mL | Freq: Two times a day (BID) | INTRAVENOUS | Status: DC

## 2018-06-07 MED ORDER — NITROGLYCERIN 0.4 MG SL SUBL: 0.4000 mg | SUBLINGUAL_TABLET | SUBLINGUAL | 4 refills | Status: DC | PRN

## 2018-06-07 MED ORDER — SODIUM CHLORIDE 0.9 % IV SOLN: INTRAVENOUS | Status: AC

## 2018-06-07 MED ORDER — HEPARIN SODIUM (PORCINE) 1000 UNIT/ML IJ SOLN
INTRAMUSCULAR | Status: DC | PRN
  Administered 2018-06-07: 5000 [IU] via INTRAVENOUS
  Administered 2018-06-07: 4500 [IU] via INTRAVENOUS

## 2018-06-07 MED ORDER — MIDAZOLAM HCL 2 MG/2ML IJ SOLN
INTRAMUSCULAR | Status: AC
  Filled 2018-06-07: qty 2

## 2018-06-07 MED ORDER — FENTANYL CITRATE (PF) 100 MCG/2ML IJ SOLN
INTRAMUSCULAR | Status: AC
  Filled 2018-06-07: qty 2

## 2018-06-07 MED ORDER — ADENOSINE (DIAGNOSTIC) 140MCG/KG/MIN
INTRAVENOUS | Status: DC | PRN
  Administered 2018-06-07: 140 ug/kg/min via INTRAVENOUS

## 2018-06-07 MED ORDER — ADENOSINE 12 MG/4ML IV SOLN
INTRAVENOUS | Status: AC
  Filled 2018-06-07: qty 16

## 2018-06-07 MED ORDER — IOPAMIDOL (ISOVUE-370) INJECTION 76%
INTRAVENOUS | Status: DC | PRN
  Administered 2018-06-07: 90 mL via INTRA_ARTERIAL

## 2018-06-07 MED ORDER — SODIUM CHLORIDE 0.9% FLUSH: 3.0000 mL | INTRAVENOUS | Status: DC | PRN

## 2018-06-07 MED ORDER — LIDOCAINE HCL (PF) 1 % IJ SOLN
INTRAMUSCULAR | Status: AC
  Filled 2018-06-07: qty 30

## 2018-06-07 MED ORDER — FENTANYL CITRATE (PF) 100 MCG/2ML IJ SOLN
INTRAMUSCULAR | Status: DC | PRN
  Administered 2018-06-07: 25 ug via INTRAVENOUS

## 2018-06-07 MED ORDER — SODIUM CHLORIDE 0.9 % IV SOLN: 250.0000 mL | INTRAVENOUS | Status: DC | PRN

## 2018-06-07 SURGICAL SUPPLY — 12 items
CATH INFINITI MULTIPACK ST 5F (CATHETERS) ×2 IMPLANT
CATH LAUNCHER 5F EBU3.5 (CATHETERS) ×2 IMPLANT
DEVICE RAD COMP TR BAND LRG (VASCULAR PRODUCTS) ×2 IMPLANT
GLIDESHEATH SLEND A-KIT 6F 22G (SHEATH) ×2 IMPLANT
GUIDEWIRE INQWIRE 1.5J.035X260 (WIRE) ×1 IMPLANT
GUIDEWIRE PRESSURE COMET II (WIRE) ×2 IMPLANT
INQWIRE 1.5J .035X260CM (WIRE) ×2
KIT ESSENTIALS PG (KITS) ×2 IMPLANT
KIT HEART LEFT (KITS) ×2 IMPLANT
PACK CARDIAC CATHETERIZATION (CUSTOM PROCEDURE TRAY) ×2 IMPLANT
TRANSDUCER W/STOPCOCK (MISCELLANEOUS) ×2 IMPLANT
TUBING CIL FLEX 10 FLL-RA (TUBING) ×2 IMPLANT

## 2018-06-07 NOTE — Discharge Instructions (Signed)
Call [facility] [address] at [phone] if any bleeding, swelling or drainage at cath site.  May shower, no tub baths for 48 hours for groin sticks. No lifting over 5 pounds for 3 days.  No Driving for 3 days  Heart Healthy Diet.  Call if any concerns.

## 2018-06-07 NOTE — Discharge Summary (Addendum)
Discharge Summary    Patient ID: Mark [name],  MRN: [id number], DOB/AGE: [birth year] 68 y.o.  Admit date: 06/03/2018 Discharge date: 06/07/2018  Primary Care Provider: Babs SciaraLuking, Scott A Primary Cardiologist: Tonny BollmanMichael Cooper, MD  Discharge Diagnoses    Principal Problem:   Chest pain stable CAD with cath 06/07/18 possible GI Active Problems:   Hypothyroidism   Hyperlipidemia   CORONARY ATHEROSCLEROSIS NATIVE CORONARY ARTERY   ESOPHAGITIS   Essential hypertension, benign   Obstructive sleep apnea   Angina pectoris (HCC)   Abnormal nuclear stress test false positive   Allergies Allergies  Allergen Reactions  . Meloxicam Rash    Diagnostic Studies/Procedures    Cardiac Cath 06/07/18   The left ventricular systolic function is normal.  LV end diastolic pressure is normal.  There is no aortic valve stenosis.  Ost LM to Mid LM lesion is 45% stenosed. FFR 0.92  Ost 1st Mrg lesion is 40% stenosed. Combined FFR 0.89  Ost LAD to Prox LAD lesion is 100% stenosed.  LIMA-LAD graft was visualized by angiography and is large. The graft exhibits no disease.  Retrograde filling of LAD to occludion site - moderate 1st Diag & small 2nd Diag; Antegrade filling of LAD reaching the inferoapex - minimal disease.  Prox LAD to Mid LAD lesion is 35% stenosed.  Severe 1 V CAD - 100% CTO of LAD that fills via LIMA Moderate (FFR Negative) Prox LM & Prox OM1.  Preserved EF with normal wall motion..  Consider Non-Cardiac Etiology for Chest Pain & False + Nuclear Stress Test.  Recommend Aspirin 81mg  daily for occlusive CAD.  Echo 06/04/18  Study Conclusions  - Left ventricle: The cavity size was normal. Wall thickness was increased in a pattern of mild LVH. Systolic function was normal. The estimated ejection fraction was in the range of 60% to 65%. Wall motion was normal; there were no regional wall motion abnormalities. Left ventricular diastolic function  parameters were normal. - Aortic valve: Mildly calcified annulus. Trileaflet; normal thickness leaflets. Valve area (VTI): 3.64 cm^2. Valve area (Vmax): 3.36 cm^2.  NUC study 06/05/15 Study Highlights    Nuclear stress EF: 61%.  Blood pressure demonstrated a hypertensive response to exercise.  There was no ST segment deviation noted during stress.  This is a low risk study.  The left ventricular ejection fraction is normal (55-65%).  Low risk stress nuclear study with a small, mild, fixed defect in the inferior basal wall consistent with thinning; no ischemia; EF 61 and normal wall motion.      _____________   History of Present Illness     [age] YOM with hx of CAD and s/p CABG X 1 2005 with LIMA-LAD, HTN HLD, esophageal stricture with hx of dilatation, hypothyroidism, OSA on CPAP, GERD, urinary retention on flomax, [facility] ER at [facility] 06/03/18 with mid sternal chest pain.  He was admitted to rule out MI.  Seen in consult by Dr. Wyline MoodBranch.    Last cath 2012, with patent LIMA to LAD and otherwise non obstructive disease.  Pt is active wlaking 2 miles per day.  His pain was described as constant and NTG with no improvement.  No EKG changes.   BP was elevated at 180/113.      Hospital Course     Consultants: Cardiology   Due to pain somewhat atypical lexiscan myoview was planned and it was returned intermediate risk stress test.  [facility].  Pt has done well since admit with no further chest pain and IV heparin continued.  Today he underwent cardiac cath and LIMA to LAD is patent.  Non obstructive disease in other vessels.  EF is normal.  Plan to continue asprin and statin.  He may need GI eval for chest pain.  He will follow up with PCP.   Pt seen and evaluated by Dr. Anne Fu post cath and found stable for discharge.  He will ambulate prior to discharge.   _____________  Discharge Vitals Blood pressure 137/79, pulse (!) 54,  temperature 98 F (36.7 C), temperature source Oral, resp. rate (!) 0, height 5\' 11"  (1.803 m), weight 197 lb 5 oz (89.5 kg), SpO2 96 %.  Filed Weights   06/05/18 0605 06/06/18 0557 06/07/18 0406  Weight: 200 lb 9.6 oz (91 kg) 198 lb (89.8 kg) 197 lb 5 oz (89.5 kg)    Labs & Radiologic Studies    CBC Recent Labs    06/06/18 0359 06/07/18 0905  WBC 5.1 4.9  HGB 14.2 14.6  HCT 42.5 44.6  MCV 94.9 95.1  PLT 156 149*   Basic Metabolic Panel Recent Labs    16/10/96 0905  NA 140  K 4.1  CL 109  CO2 22  GLUCOSE 116*  BUN 13  CREATININE 0.84  CALCIUM 8.9   Liver Function Tests No results for input(s): AST, ALT, ALKPHOS, BILITOT, PROT, ALBUMIN in the last 72 hours. No results for input(s): LIPASE, AMYLASE in the last 72 hours. Cardiac Enzymes No results for input(s): CKTOTAL, CKMB, CKMBINDEX, TROPONINI in the last 72 hours. BNP Invalid input(s): POCBNP D-Dimer No results for input(s): DDIMER in the last 72 hours. Hemoglobin A1C No results for input(s): HGBA1C in the last 72 hours. Fasting Lipid Panel No results for input(s): CHOL, HDL, LDLCALC, TRIG, CHOLHDL, LDLDIRECT in the last 72 hours. Thyroid Function Tests No results for input(s): TSH, T4TOTAL, T3FREE, THYROIDAB in the last 72 hours.  Invalid input(s): FREET3 _____________  Dg Chest 2 View  Result Date: 06/03/2018 CLINICAL DATA:  Mid chest pain today. EXAM: CHEST - 2 VIEW COMPARISON:  07/24/2005 CXR FINDINGS: Heart is top-normal in size. There is mild aortic atherosclerosis with slight uncoiling of the thoracic aorta. Status post CABG with median sternotomy sutures in place. The lungs are clear. Resected appearance of the distal right clavicle, new since prior. IMPRESSION: 1. Redemonstration of post CABG change. 2. No active pulmonary disease. 3. Resected distal right clavicle. Electronically Signed   By: Tollie Eth M.D.   On: 06/03/2018 18:40   Nm Myocar Multi W/spect W/wall Motion / Ef  Result Date:  06/04/2018  There was no ST segment deviation noted during stress.  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 There is some adjacent gut radiotracer uptake, however the inferior/inferoseptal wall is hypokinetic and the defect is clearly more intense in the post-stress images  The left ventricular ejection fraction is normal (55-65%).  This is an intermediate risk study.    Disposition   Pt is being discharged home today in good condition.  Follow-up Plans & Appointments   Call [facility] at [phone] if any bleeding, swelling or drainage at cath site.  May shower, no tub baths for 48 hours for groin sticks. No lifting over 5 pounds for 3 days.  No Driving for 3 days  Heart Healthy Diet.  Call if any concerns.  Follow-up Information    Tonny Bollman, MD Follow up  on 06/30/2018.   Specialty:  Cardiology Why:  at 10:45 AM with Ronie Spies, PA-C Contact information: [address] [place] [address] [phone]            Discharge Medications   Allergies as of 06/07/2018      Reactions   Meloxicam Rash      Medication List    TAKE these medications   albuterol 108 (90 Base) MCG/ACT inhaler Commonly known as:  PROVENTIL HFA;VENTOLIN HFA TAKE 2 PUFFS BY MOUTH EVERY 6 HOURS AS NEEDED FOR WHEEZE OR SHORTNESS OF BREATH   ALPRAZolam 0.25 MG tablet Commonly known as:  XANAX TAKE 1 TABLET AT BEDTIME AS NEEDED What changed:    how much to take  how to take this  when to take this   aspirin 81 MG tablet Take 81 mg by mouth every morning.   atorvastatin 80 MG tablet Commonly known as:  LIPITOR TAKE 1 TABLET (80 MG TOTAL) BY MOUTH DAILY. What changed:    how much to take  how to take this  when to take this  additional instructions   ciprofloxacin 500 MG tablet Commonly known as:  CIPRO Take 1 tablet (500 mg total) by mouth 2 (two) times daily.    levothyroxine 100 MCG tablet Commonly known as:  SYNTHROID, LEVOTHROID Take 1 tablet (100 mcg total) by mouth daily.   losartan 50 MG tablet Commonly known as:  COZAAR Take one tablet daily. What changed:    how much to take  how to take this  when to take this  additional instructions   metoprolol succinate 25 MG 24 hr tablet Commonly known as:  TOPROL-XL 1/2 tablet daily What changed:    how much to take  how to take this  when to take this  additional instructions   mupirocin ointment 2 % Commonly known as:  BACTROBAN Apply to affected area once times daily to nostril What changed:    how much to take  how to take this  when to take this  additional instructions   naproxen 500 MG tablet Commonly known as:  NAPROSYN Take 500 mg by mouth 2 (two) times daily with a meal.   nitroGLYCERIN 0.4 MG SL tablet Commonly known as:  NITROSTAT Place 1 tablet (0.4 mg total) under the tongue every 5 (five) minutes as needed for chest pain.   omeprazole 20 MG capsule Commonly known as:  PRILOSEC Take 1 capsule (20 mg total) by mouth daily.   tamsulosin 0.4 MG Caps capsule Commonly known as:  FLOMAX Take 1 capsule (0.4 mg total) by mouth daily.   triamcinolone cream 0.1 % Commonly known as:  KENALOG Apply 1 application topically 2 (two) times daily. What changed:    when to take this  reasons to take this        Acute coronary syndrome (MI, NSTEMI, STEMI, etc) this admission?: No.    Outstanding Labs/Studies     Duration of Discharge Encounter   Greater than 30 minutes including physician time.  Signed, Nada Boozer, NP 06/07/2018, 12:35 PM   Personally seen and examined. Agree with above.  Doing well post catheterization, reassurance.  Continue with aspirin therapy.  Overall okay with discharge later today post catheterization protocol left radial.  Continue with aggressive secondary prevention, Lipitor.  He is on Protonix for GERD.  If  symptoms persist, consider further GI evaluation, address with primary physician.  Exam shows that he is alert, radial artery site normal.  Candee Furbish, MD

## 2018-06-07 NOTE — Interval H&P Note (Signed)
History and Physical Interval Note:  06/07/2018 7:16 AM  Mark [name]  has presented today for surgery, with the diagnosis of chest pain with pos. stress test  The various methods of treatment have been discussed with the patient and family. After consideration of risks, benefits and other options for treatment, the patient has consented to  Procedure(s): LEFT HEART CATH AND CORS/GRAFTS ANGIOGRAPHY (N/A) with possible PERCUTANEOUS CORONARY INTERVENTION as a surgical intervention .    The patient's history has been reviewed, patient examined, no change in status, stable for surgery.  I have reviewed the patient's chart and labs.  Questions were answered to the patient's satisfaction.    Cath Lab Visit (complete for each Cath Lab visit)  Clinical Evaluation Leading to the Procedure:   ACS: Yes.    Non-ACS:    Anginal Classification: CCS IV  Anti-ischemic medical therapy: Minimal Therapy (1 class of medications)  Non-Invasive Test Results: Intermediate-risk stress test findings: cardiac mortality 1-3%/year  Prior CABG: Previous CABG  Bryan Lemmaavid Sudiksha Victor

## 2018-06-07 NOTE — Progress Notes (Addendum)
Progress Note  Patient Name: Mark [name] Date of Encounter: 06/07/2018  Primary Cardiologist: Tonny BollmanMichael Cooper, MD   Subjective   Has had no chest pain since admit.  Feels well post cath, sitting up to eat.   Inpatient Medications    Scheduled Meds: . aspirin EC  81 mg Oral Daily  . atorvastatin  80 mg Oral QPM  . levothyroxine  100 mcg Oral QAC breakfast  . losartan  100 mg Oral q morning - 10a  . metoprolol succinate  12.5 mg Oral Daily  . pantoprazole  40 mg Oral Daily  . sodium chloride flush  3 mL Intravenous Q12H  . sodium chloride flush  3 mL Intravenous Q12H  . tamsulosin  0.4 mg Oral Daily   Continuous Infusions: . sodium chloride    . sodium chloride    . sodium chloride     PRN Meds: sodium chloride, sodium chloride, acetaminophen **OR** acetaminophen, ALPRAZolam, morphine injection, nitroGLYCERIN, ondansetron **OR** ondansetron (ZOFRAN) IV, sodium chloride flush, sodium chloride flush   Vital Signs    Vitals:   06/07/18 0831 06/07/18 0836 06/07/18 0848 06/07/18 0853  BP:   137/79   Pulse: (!) 0 (!) 150  (!) 54  Resp: (!) 9 (!) 0    Temp:      TempSrc:      SpO2: (!) 0% (!) 0%  96%  Weight:      Height:        Intake/Output Summary (Last 24 hours) at 06/07/2018 0924 Last data filed at 06/07/2018 0658 Gross per 24 hour  Intake 1451.84 ml  Output 2800 ml  Net -1348.16 ml   Filed Weights   06/05/18 0605 06/06/18 0557 06/07/18 0406  Weight: 200 lb 9.6 oz (91 kg) 198 lb (89.8 kg) 197 lb 5 oz (89.5 kg)    Telemetry    SR - Personally Reviewed  ECG    SB at 51  - Personally Reviewed  Physical Exam   GEN: No acute distress.   Neck: No JVD Cardiac: RRR, no murmurs, rubs, or gallops.  Lt wrist cath site is stable.   Respiratory: Clear to auscultation bilaterally. GI: Soft, nontender, non-distended  MS: No edema; No deformity. Neuro:  Nonfocal  Psych: Normal affect   Labs    Chemistry Recent Labs  Lab 06/03/18 1902 06/04/18 0350    NA 136 140  K 3.4* 3.7  CL 102 107  CO2 26 25  GLUCOSE 105* 136*  BUN 18 18  CREATININE 0.94 0.83  CALCIUM 9.3 9.0  GFRNONAA >60 >60  GFRAA >60 >60  ANIONGAP 8 8     Hematology Recent Labs  Lab 06/04/18 0350 06/05/18 1053 06/06/18 0359  WBC 5.8 3.8* 5.1  RBC 4.25 4.41 4.48  HGB 13.4 13.7 14.2  HCT 39.7 41.9 42.5  MCV 93.4 95.0 94.9  MCH 31.5 31.1 31.7  MCHC 33.8 32.7 33.4  RDW 12.6 12.2 12.3  PLT 164 150 156    Cardiac Enzymes Recent Labs  Lab 06/03/18 1902 06/03/18 2143 06/04/18 0350 06/04/18 0937  TROPONINI <0.03 <0.03 <0.03 <0.03   No results for input(s): TROPIPOC in the last 168 hours.   BNPNo results for input(s): BNP, PROBNP in the last 168 hours.   DDimer No results for input(s): DDIMER in the last 168 hours.   Radiology    No results found.  Cardiac Studies   Cardiac Cath 06/07/18   The left ventricular systolic function is normal.  LV end diastolic  pressure is normal.  There is no aortic valve stenosis.  Ost LM to Mid LM lesion is 45% stenosed. FFR 0.92  Ost 1st Mrg lesion is 40% stenosed. Combined FFR 0.89  Ost LAD to Prox LAD lesion is 100% stenosed.  LIMA-LAD graft was visualized by angiography and is large. The graft exhibits no disease.  Retrograde filling of LAD to occludion site - moderate 1st Diag & small 2nd Diag; Antegrade filling of LAD reaching the inferoapex - minimal disease.  Prox LAD to Mid LAD lesion is 35% stenosed.   Severe 1 V CAD - 100% CTO of LAD that fills via LIMA Moderate (FFR Negative) Prox LM & Prox OM1.  Preserved EF with normal wall motion..  Consider Non-Cardiac Etiology for Chest Pain & False + Nuclear Stress Test.  Recommend Aspirin 81mg  daily for occlusive CAD.  Echo 06/04/18  Study Conclusions  - Left ventricle: The cavity size was normal. Wall thickness was   increased in a pattern of mild LVH. Systolic function was normal.   The estimated ejection fraction was in the range of 60% to  65%.   Wall motion was normal; there were no regional wall motion   abnormalities. Left ventricular diastolic function parameters   were normal. - Aortic valve: Mildly calcified annulus. Trileaflet; normal   thickness leaflets. Valve area (VTI): 3.64 cm^2. Valve area   (Vmax): 3.36 cm^2.  NUC study 06/05/15 Study Highlights    Nuclear stress EF: 61%.  Blood pressure demonstrated a hypertensive response to exercise.  There was no ST segment deviation noted during stress.  This is a low risk study.  The left ventricular ejection fraction is normal (55-65%).   Low risk stress nuclear study with a small, mild, fixed defect in the inferior basal wall consistent with thinning; no ischemia; EF 61 and normal wall motion.      Patient Profile     67 y.o. male with a hx of CAD s/p CABGx1 2005 (LIMA-LAD), HTN, dyslipidemia, esophageal stricture (s/p remote dilation by Dr. Juanda Chance) hypothyroidism, OSA on CPAP, GERD, urinary retention on Flomax, insomnia whopresented with chest pain and was found to have an intermediate risk study.   Assessment & Plan    Chest Pain - hx CAD with CABG and intermediate risk stress test neg troponin --no pain--on ASA and IV heparin now stopped post cath. --cardiac cath with stable disease-Dr. Herbie Baltimore felt false + nuc study  -- ambulate after post cath orders are complete and possible discharge today.  --labs pending  OSA on CPAP  HLD on lipitor 80   GERD on protonix   HTN on cozaar and toprol BP controlled     For questions or updates, please contact [facility] Please consult [email or web address] for contact info under Cardiology/STEMI.      Signed, Nada Boozer, NP  06/07/2018, 9:24 AM    Personally seen and examined. Agree with above.  Doing well post catheterization, reassurance.  Continue with aspirin therapy.  Overall okay with discharge later today post catheterization protocol left radial.  Continue with aggressive secondary  prevention, Lipitor.  He is on Protonix for GERD.  If symptoms persist, consider further GI evaluation, address with primary physician.  Exam shows that he is alert, radial artery site normal.  Donato Schultz, MD

## 2018-06-07 NOTE — Plan of Care (Signed)

## 2018-06-08 MED FILL — Lidocaine HCl Local Preservative Free (PF) Inj 1%: INTRAMUSCULAR | Qty: 30 | Status: AC

## 2018-06-24 ENCOUNTER — Other Ambulatory Visit: Payer: Self-pay | Admitting: Family Medicine

## 2018-06-29 ENCOUNTER — Ambulatory Visit: Payer: Medicare Other | Admitting: Family Medicine

## 2018-06-29 ENCOUNTER — Encounter: Payer: Self-pay | Admitting: Physician Assistant

## 2018-06-29 VITALS — BP 130/94 | Temp 98.7°F | Ht 71.0 in | Wt 202.4 lb

## 2018-06-29 DIAGNOSIS — M545 Low back pain, unspecified: Secondary | ICD-10-CM

## 2018-06-29 DIAGNOSIS — R0789 Other chest pain: Secondary | ICD-10-CM | POA: Diagnosis not present

## 2018-06-29 DIAGNOSIS — M5431 Sciatica, right side: Secondary | ICD-10-CM | POA: Diagnosis not present

## 2018-06-29 NOTE — Progress Notes (Addendum)
Cardiology Office Note    Date:  06/30/2018  ID:  Mark [name], DOB 1951-11-14, MRN [id number] PCP:  Babs SciaraLuking, Scott A, MD  Cardiologist:  Tonny BollmanMichael Cooper, MD   Chief Complaint: f/u chest pain  History of Present Illness:  Mark [name] is a 67 y.o. male with history of CAD s/p CABGx1 2005 (LIMA-LAD), HTN, dyslipidemia, esophageal stricture (s/p remote dilation by Dr. Juanda ChanceBrodie) hypothyroidism, OSA on CPAP, GERD, urinary retention on Flomax, insomnia who presents back for post-cath follow-up. He was recently admitted with chest pain and elevated BP with negative troponins. Nuclear stress test was abnormal prompting cath, which showed 100% CTO of LAD filling via LIMA, moderate prox LM and prox OM1 with negative FFR, EF with normal wall motion - false positive nuc deemed. 2D Echo 06/04/18 showed mild LVH, EF 60-65%. It was felt he may need GI eval if pain persisted. Labs otherwise notable for mildly elevated glucose, K 4.1, CR 0.84, Hgb 14.6; last LDL 66 in 02/2018.  He returns for follow-up overall feeling better. Did have one episode of recurrent discomfort so PCP has referred him to GI. He walked 3 miles this AM with no problem. No SOB or CP. No edema, palpitations. He has had some hip pain that primary care thinks might be coming from back. No calf pain. Pulses in tact.   Past Medical History:  Diagnosis Date  . Abnormal nuclear stress test false positive 05/2018  . Chest pain stable CAD with cath 06/07/18 possible GI 06/03/2018  . Coronary artery disease    a. s/p CABG 2005. b. cath 05/2018 - patent LIMA-LAD, moderate LM and prox OM disease with neg FFR.  Marland Kitchen. Dyslipidemia   . Esophageal stricture   . ESOPHAGEAL STRICTURE 12/18/2008   Qualifier: History of  By: Nelson-Smith CMA (AAMA), Dottie    . ESOPHAGITIS 12/18/2008   Qualifier: History of  By: Nelson-Smith CMA (AAMA), Dottie    . Essential hypertension, benign 04/06/2013  . Gastrointestinal bleeding   . GERD (gastroesophageal reflux  disease)   . History of colonic polyps 08/26/2014   [facility] [place] colonoscopy in May of 2015. They recommend repeat colonoscopy May of 2017 do to inadequate prep, pathology showed hyperplastic polyp   . Hyperlipidemia   . Hypothyroidism   . Obstructive sleep apnea 07/11/2013   Has mixed apnea, CPAP setting 7   . OSA (obstructive sleep apnea)   . RECTAL BLEEDING 12/18/2008   Qualifier: Diagnosis of  By: Nelson-Smith CMA (AAMA), Dottie    . Urinary retention   . Vitiligo 07/11/2013    Past Surgical History:  Procedure Laterality Date  . APPENDECTOMY    . bilateral inguinal hernia repair    . CARDIAC CATHETERIZATION  12/2005, 06/2005  . COLONOSCOPY    . CORONARY ARTERY BYPASS GRAFT  2006   LIMA to LAD, off pump  . INTRAVASCULAR PRESSURE WIRE/FFR STUDY N/A 06/07/2018   Procedure: INTRAVASCULAR PRESSURE WIRE/FFR STUDY;  Surgeon: Marykay LexHarding, David W, MD;  [facility];  Service: Cardiovascular;  Laterality: N/A;  . LEFT HEART CATH AND CORS/GRAFTS ANGIOGRAPHY N/A 06/07/2018   Procedure: LEFT HEART CATH AND CORS/GRAFTS ANGIOGRAPHY;  Surgeon: Marykay LexHarding, David W, MD;  [facility];  Service: Cardiovascular;  Laterality: N/A;  . OPEN REDUCTION INTERNAL FIXATION (ORIF) HAND Left 07/21/2015   Procedure: IRRIGATION AND DEBRIDMENT LEFT INDEX AND MIDDLE FINGER, OPEN REDUCTION INTERNAL FIXATION LEFT INDEX FINGER.;  Surgeon: Dominica SeverinWilliam Gramig, MD;  Location:  [facility] OR;  Service: Orthopedics;  Laterality: Left;    Current Medications: Current Meds  Medication Sig  . albuterol (PROVENTIL HFA;VENTOLIN HFA) 108 (90 Base) MCG/ACT inhaler TAKE 2 PUFFS BY MOUTH EVERY 6 HOURS AS NEEDED FOR WHEEZE OR SHORTNESS OF BREATH  . ALPRAZolam (XANAX) 0.25 MG tablet Take 1 tablet by mouth at bedtime as needed.  Marland Kitchen aspirin 81 MG tablet Take 81 mg by mouth every morning.   Marland Kitchen atorvastatin (LIPITOR) 80 MG tablet TAKE 1 TABLET (80 MG TOTAL) BY MOUTH DAILY.  Marland Kitchen  levothyroxine (SYNTHROID, LEVOTHROID) 100 MCG tablet Take 1 tablet (100 mcg total) by mouth daily.  Marland Kitchen losartan (COZAAR) 50 MG tablet Take 1 tablet by mouth daily.  . metoprolol succinate (TOPROL-XL) 25 MG 24 hr tablet Take 0.5 tablets by mouth daily.  . mupirocin ointment (BACTROBAN) 2 % Apply to affected area once times daily to nostril  . naproxen (NAPROSYN) 500 MG tablet Take 500 mg by mouth 2 (two) times daily with a meal.  . nitroGLYCERIN (NITROSTAT) 0.4 MG SL tablet Place 1 tablet (0.4 mg total) under the tongue every 5 (five) minutes as needed for chest pain.  Marland Kitchen omeprazole (PRILOSEC) 20 MG capsule Take 1 capsule (20 mg total) by mouth daily.  . tamsulosin (FLOMAX) 0.4 MG CAPS capsule TAKE 1 CAPSULE BY MOUTH EVERY DAY  . triamcinolone cream (KENALOG) 0.1 % Apply 1 application topically 2 (two) times daily.    Allergies:   Meloxicam   Social History   Socioeconomic History  . Marital status: Married    Spouse name: Not on file  . Number of children: Not on file  . Years of education: Not on file  . Highest education level: Not on file  Occupational History  . Occupation: Engineer, agricultural: [employer]  Social Needs  . Financial resource strain: Not on file  . Food insecurity:    Worry: Not on file    Inability: Not on file  . Transportation needs:    Medical: Not on file    Non-medical: Not on file  Tobacco Use  . Smoking status: Former Smoker    Last attempt to quit: 07/31/1979    Years since quitting: 38.9  . Smokeless tobacco: Former Engineer, water and Sexual Activity  . Alcohol use: Yes    Comment: occassional  . Drug use: No  . Sexual activity: Not on file  Lifestyle  . Physical activity:    Days per week: Not on file    Minutes per session: Not on file  . Stress: Not on file  Relationships  . Social connections:    Talks on phone: Not on file    Gets together: Not on file    Attends religious service: Not on file    Active member of club or  organization: Not on file    Attends meetings of clubs or organizations: Not on file    Relationship status: Not on file  Other Topics Concern  . Not on file  Social History Narrative  . Not on file     Family History:  The patient's family history includes Heart disease in his father; Hypertension in his father.  ROS:   Please see the history of present illness.  All other systems are reviewed and otherwise negative.    PHYSICAL EXAM:   VS:  BP 102/78   Pulse 68   Ht 5\' 11"  (1.803 m)   Wt 201 lb (91.2 kg)   SpO2  96%   BMI 28.03 kg/m   BMI: Body mass index is 28.03 kg/m. GEN: Well nourished, well developed WM, in no acute distress HEENT: normocephalic, atraumatic Neck: no JVD, carotid bruits, or masses Cardiac: RRR; no murmurs, rubs, or gallops, no edema, pedal pulses in tact and equal bilaterally, no discoloration Respiratory:  clear to auscultation bilaterally, normal work of breathing GI: soft, nontender, nondistended, + BS MS: no deformity or atrophy Skin: warm and dry, no rash. Left radial cath site without hematoma or ecchymosis; good pulse. Neuro:  Alert and Oriented x 3, Strength and sensation are intact, follows commands Psych: euthymic mood, full affect  Wt Readings from Last 3 Encounters:  06/30/18 201 lb (91.2 kg)  06/29/18 202 lb 6.4 oz (91.8 kg)  06/07/18 197 lb 5 oz (89.5 kg)      Studies/Labs Reviewed:   EKG:  EKG was not ordered today.  Recent Labs: 02/25/2018: ALT 20; TSH 4.050 06/04/2018: Magnesium 2.1 06/07/2018: BUN 13; Creatinine, Ser 0.84; Hemoglobin 14.6; Platelets 149; Potassium 4.1; Sodium 140   Lipid Panel    Component Value Date/Time   CHOL 163 02/25/2018 0817   TRIG 263 (H) 02/25/2018 0817   HDL 44 02/25/2018 0817   CHOLHDL 3.7 02/25/2018 0817   CHOLHDL 3.3 10/31/2014 0919   VLDL 40 10/31/2014 0919   LDLCALC 66 02/25/2018 0817    Additional studies/ records that were reviewed today include: Summarized above.    ASSESSMENT  & PLAN:   1. History of chest pain - cardiac workup stable as above. Agree with GI referral. We also discussed a possible hiatus from his naproxen which he takes for chronic arthritic pain, as he may use Tylenol instead. This might be contributing to some gastritis. No bleeding reported. He is on a PPI. 2. CAD - stable by recent cath. Continue risk factor modification. 3. Hypertension - controlled, low-normal today. Asymptomatic. 4. Hyperlipidemia - controlled by last labs. 5. R hip pain - pulses in tact, no discoloration. PCP gave him some exercises to try as he felt this was coming from his back. It is not typical for claudication (worse with bending, twisting, standing). If no relief could consider LE vascular testing.  Disposition: F/u with Dr. Excell Seltzerooper in 6 months to get re-established back (last saw 3 years ago).   Medication Adjustments/Labs and Tests Ordered: Current medicines are reviewed at length with the patient today.  Concerns regarding medicines are outlined above. Medication changes, Labs and Tests ordered today are summarized above and listed in the Patient Instructions accessible in Encounters.   Signed, Laurann Montanaayna N Dunn, PA-C  06/30/2018 11:18 AM    [facility] [address], [place], [place]  [address] Phone: [phone]; Fax: [phone]

## 2018-06-29 NOTE — Progress Notes (Signed)
   Subjective:    Patient ID: Mark [name], male    DOB: 10/14/1951, 67 y.o.   MRN: [id number]  Back Pain  This is a new problem. The current episode started more than 1 month ago. The problem occurs constantly. The problem has been waxing and waning since onset. The pain is present in the lumbar spine. The quality of the pain is described as aching. The pain radiates to the right thigh (right hip). The pain is at a severity of 6/10. The pain is moderate. The pain is worse during the day. The symptoms are aggravated by bending, position, twisting and standing (movement). Associated symptoms include leg pain. Pertinent negatives include no bowel incontinence, headaches, numbness or paresthesias. He has tried heat and analgesics (pain meds (Tylenol)) for the symptoms. The treatment provided moderate relief.  No history of any injury no history of back pain overall energy level doing okay no fever chills or sweats    Review of Systems  Gastrointestinal: Negative for bowel incontinence.  Musculoskeletal: Positive for back pain.  Neurological: Negative for numbness, headaches and paresthesias.  Denies any numbness down the legs.  Does relate some pain and discomfort from the low back into the right upper leg on the lateral side     Objective:   Physical Exam Lungs are clear respiratory rate normal heart is regular no murmurs pulses normal low back subjective discomfort in the right lower back negative straight leg raise reflexes good can walk on heels can walk on toes without difficulty       Assessment & Plan:  Back pain sciatica Takes anti-inflammatory Stretches recommended Exercises given X-rays indicated because of being older than 50+ also pain off and on for several months worse recently  MRI not indicated  If persistent symptoms physical therapy if that does not do well enough then the next step would be injections or possible referral to back specialist Patient to keep us  updated  Patient also has intermittent chest pain was discussed by cardiology felt to be negative for coronary artery disease they felt it was more than likely noncardiac chest pain his x-ray looked good therefore referral to gastroenterology for EGD for consideration of esophageal causes of chest pain patient to follow-up if ongoing troubles

## 2018-06-30 ENCOUNTER — Encounter: Payer: Self-pay | Admitting: Physician Assistant

## 2018-06-30 ENCOUNTER — Ambulatory Visit (INDEPENDENT_AMBULATORY_CARE_PROVIDER_SITE_OTHER): Payer: Medicare Other | Admitting: Physician Assistant

## 2018-06-30 VITALS — BP 102/78 | HR 68 | Ht 71.0 in | Wt 201.0 lb

## 2018-06-30 DIAGNOSIS — M25551 Pain in right hip: Secondary | ICD-10-CM

## 2018-06-30 DIAGNOSIS — Z87898 Personal history of other specified conditions: Secondary | ICD-10-CM

## 2018-06-30 DIAGNOSIS — I2510 Atherosclerotic heart disease of native coronary artery without angina pectoris: Secondary | ICD-10-CM | POA: Diagnosis not present

## 2018-06-30 DIAGNOSIS — E785 Hyperlipidemia, unspecified: Secondary | ICD-10-CM | POA: Diagnosis not present

## 2018-06-30 DIAGNOSIS — I10 Essential (primary) hypertension: Secondary | ICD-10-CM

## 2018-06-30 NOTE — Patient Instructions (Signed)
Medication Instructions:   Your physician recommends that you continue on your current medications as directed. Please refer to the Current Medication list given to you today.  If you need a refill on your cardiac medications before your next appointment, please call your pharmacy.  Labwork: NONE ORDERED  TODAY    Testing/Procedures: NONE ORDERED  TODAY    Follow-Up:  Your physician wants you to follow-up in:  IN 6   MONTHS WITH DR COOPER   You will receive a reminder letter in the mail two months in advance. If you don't receive a letter, please call our office to schedule the follow-up appointment.      Any Other Special Instructions Will Be Listed Below (If Applicable).                                                                                                                                                   

## 2018-07-06 ENCOUNTER — Encounter: Payer: Self-pay | Admitting: Family Medicine

## 2018-07-10 ENCOUNTER — Other Ambulatory Visit: Payer: Self-pay | Admitting: Family Medicine

## 2018-07-12 MED ORDER — TAMSULOSIN HCL 0.4 MG PO CAPS: 0.4000 mg | ORAL_CAPSULE | Freq: Every day | ORAL | 1 refills | Status: DC

## 2018-07-29 ENCOUNTER — Other Ambulatory Visit: Payer: Self-pay | Admitting: Family Medicine

## 2018-07-29 NOTE — Telephone Encounter (Signed)
He may have this plus four additional refills

## 2018-07-30 ENCOUNTER — Other Ambulatory Visit: Payer: Self-pay

## 2018-07-30 MED ORDER — ALPRAZOLAM 0.25 MG PO TABS: 0.2500 mg | ORAL_TABLET | Freq: Every evening | ORAL | 4 refills | Status: DC | PRN

## 2018-08-10 ENCOUNTER — Telehealth: Payer: Self-pay | Admitting: Family Medicine

## 2018-08-10 ENCOUNTER — Other Ambulatory Visit: Payer: Self-pay | Admitting: *Deleted

## 2018-08-10 MED ORDER — SCOPOLAMINE 1 MG/3DAYS TD PT72: 1.0000 | MEDICATED_PATCH | TRANSDERMAL | 1 refills | Status: DC

## 2018-08-10 NOTE — Telephone Encounter (Signed)
Med sent to pharm. Pt notified.  

## 2018-08-10 NOTE — Telephone Encounter (Signed)
Scopolamine patch, apply every 3 days, one box, 1 refill, start right before cruise, may cause dry mouth or blurred vision and a small number of people can cause difficulty urination if any significant problems in any of these areas stop the patch and take it off

## 2018-08-10 NOTE — Telephone Encounter (Signed)
Pt going on 7 day cruise and would like [facility] [address]

## 2018-08-16 ENCOUNTER — Other Ambulatory Visit: Payer: Self-pay | Admitting: Family Medicine

## 2018-08-17 NOTE — Telephone Encounter (Signed)
Left message to return call. To see if pt is taking med

## 2018-08-20 NOTE — Telephone Encounter (Signed)
Left message to return call to see if pt is taking med.

## 2018-08-24 ENCOUNTER — Telehealth: Payer: Self-pay | Admitting: *Deleted

## 2018-08-24 NOTE — Telephone Encounter (Signed)
Patient requesting labs for his upcoming appt 09/02/18.  Last labs: Lipid, Liver, TSH, HgbA1c 02/2018

## 2018-08-24 NOTE — Telephone Encounter (Signed)
May have 90-day with 1 refill thank you

## 2018-08-25 NOTE — Telephone Encounter (Signed)
Lipid, liver, metabolic 7, PSA-hyperlipidemia hypertension, screening for prostate cancer Also TSH

## 2018-08-26 ENCOUNTER — Other Ambulatory Visit: Payer: Self-pay | Admitting: *Deleted

## 2018-08-26 DIAGNOSIS — Z79899 Other long term (current) drug therapy: Secondary | ICD-10-CM

## 2018-08-26 DIAGNOSIS — Z125 Encounter for screening for malignant neoplasm of prostate: Secondary | ICD-10-CM

## 2018-08-26 DIAGNOSIS — I10 Essential (primary) hypertension: Secondary | ICD-10-CM

## 2018-08-26 DIAGNOSIS — E039 Hypothyroidism, unspecified: Secondary | ICD-10-CM

## 2018-08-26 DIAGNOSIS — E785 Hyperlipidemia, unspecified: Secondary | ICD-10-CM

## 2018-08-26 NOTE — Telephone Encounter (Signed)
Orders put in and pt notified.  

## 2018-08-28 LAB — LIPID PANEL
CHOLESTEROL TOTAL: 159 mg/dL (ref 100–199)
Chol/HDL Ratio: 3.8 ratio (ref 0.0–5.0)
HDL: 42 mg/dL (ref >39)
LDL CALC: 74 mg/dL (ref 0–99)
TRIGLYCERIDES: 215 mg/dL — AB (ref 0–149)
VLDL Cholesterol Cal: 43 mg/dL — ABNORMAL HIGH (ref 5–40)

## 2018-08-28 LAB — PSA: PROSTATE SPECIFIC AG, SERUM: 2 ng/mL (ref 0.0–4.0)

## 2018-08-28 LAB — BASIC METABOLIC PANEL WITH GFR
BUN/Creatinine Ratio: 16 (ref 10–24)
BUN: 16 mg/dL (ref 8–27)
CO2: 25 mmol/L (ref 20–29)
Calcium: 9.5 mg/dL (ref 8.6–10.2)
Chloride: 102 mmol/L (ref 96–106)
Creatinine, Ser: 1.03 mg/dL (ref 0.76–1.27)
GFR calc Af Amer: 86 mL/min/1.73
GFR calc non Af Amer: 75 mL/min/1.73
Glucose: 115 mg/dL — ABNORMAL HIGH (ref 65–99)
Potassium: 4.2 mmol/L (ref 3.5–5.2)
Sodium: 140 mmol/L (ref 134–144)

## 2018-08-28 LAB — HEPATIC FUNCTION PANEL
ALT: 21 IU/L (ref 0–44)
AST: 20 IU/L (ref 0–40)
Albumin: 4.7 g/dL (ref 3.6–4.8)
Alkaline Phosphatase: 57 IU/L (ref 39–117)
Bilirubin Total: 1.7 mg/dL — ABNORMAL HIGH (ref 0.0–1.2)
Bilirubin, Direct: 0.29 mg/dL (ref 0.00–0.40)
Total Protein: 6.8 g/dL (ref 6.0–8.5)

## 2018-08-28 LAB — TSH: TSH: 7.69 u[IU]/mL — ABNORMAL HIGH (ref 0.450–4.500)

## 2018-09-02 ENCOUNTER — Ambulatory Visit: Payer: Medicare Other | Admitting: Family Medicine

## 2018-09-02 ENCOUNTER — Encounter: Payer: Self-pay | Admitting: Family Medicine

## 2018-09-02 VITALS — BP 114/76 | Ht 71.0 in | Wt 206.0 lb

## 2018-09-02 DIAGNOSIS — R5383 Other fatigue: Secondary | ICD-10-CM | POA: Diagnosis not present

## 2018-09-02 DIAGNOSIS — E785 Hyperlipidemia, unspecified: Secondary | ICD-10-CM

## 2018-09-02 DIAGNOSIS — E039 Hypothyroidism, unspecified: Secondary | ICD-10-CM

## 2018-09-02 DIAGNOSIS — Z0000 Encounter for general adult medical examination without abnormal findings: Secondary | ICD-10-CM | POA: Diagnosis not present

## 2018-09-02 MED ORDER — ZOSTER VAC RECOMB ADJUVANTED 50 MCG/0.5ML IM SUSR: 0.5000 mL | Freq: Once | INTRAMUSCULAR | 1 refills | Status: AC

## 2018-09-02 MED ORDER — LEVOTHYROXINE SODIUM 112 MCG PO TABS: 112.0000 ug | ORAL_TABLET | Freq: Every day | ORAL | 1 refills | Status: DC

## 2018-09-02 MED ORDER — LOSARTAN POTASSIUM 50 MG PO TABS: ORAL_TABLET | ORAL | 1 refills | Status: DC

## 2018-09-02 NOTE — Progress Notes (Addendum)
Subjective:    Patient ID: Mark [name], male    DOB: [birth year], 67 y.o.   MRN: [id number]  HPI AWV- Annual Wellness Visit  The patient was seen for their annual wellness visit. The patient's past medical history, surgical history, and family history were reviewed. Pertinent vaccines were reviewed ( tetanus, pneumonia, shingles, flu) The patient's medication list was reviewed and updated.  The height and weight were entered.  BMI recorded in electronic record elsewhere  Cognitive screening was completed. Outcome of Mini - Cog: pass   Falls /depression screening electronically recorded within record elsewhere  Current tobacco usage: none (All patients who use tobacco were given written and verbal information on quitting)  Recent listing of emergency department/hospitalizations over the past year were reviewed.  current specialist the patient sees on a regular basis: cardiologist   Medicare annual wellness visit patient questionnaire was reviewed.  A written screening schedule for the patient for the next 5-10 years was given. Appropriate discussion of followup regarding next visit was discussed.   Patient for blood pressure check up.  The patient does have hypertension.  The patient is on medication.  Patient relates compliance with meds. Todays BP reviewed with the patient. Patient denies issues with medication. Patient relates reasonable diet. Patient tries to minimize salt. Patient aware of BP goals.  Patient here for follow-up regarding cholesterol.  The patient does have hyperlipidemia.  Patient does try to maintain a reasonable diet.  Patient does take the medication on a regular basis.  Denies missing a dose.  The patient denies any obvious side effects.  Prior blood work results reviewed with the patient.  The patient is aware of his cholesterol goals and the need to keep it under good control to lessen the risk of disease.  Patient has thyroid condition.  Takes  thyroid medication on a regular basis.  States that the proper way.  Relates compliance.  States no negative side effects.  States condition seems to be under good control.  Patient does have sleep apnea.  He uses CPAP machine on a regular basis.  He does benefit from use of the CPAP machine.  It is medically necessary.  He states he feels better with it.  He is compliant with it.  Review of Systems  Constitutional: Negative for activity change, appetite change and fever.  HENT: Negative for congestion and rhinorrhea.   Eyes: Negative for discharge.  Respiratory: Negative for cough and wheezing.   Cardiovascular: Negative for chest pain.  Gastrointestinal: Negative for abdominal pain, blood in stool and vomiting.  Genitourinary: Negative for difficulty urinating and frequency.  Musculoskeletal: Negative for neck pain.  Skin: Negative for rash.  Allergic/Immunologic: Negative for environmental allergies and food allergies.  Neurological: Negative for weakness and headaches.  Psychiatric/Behavioral: Negative for agitation.       Objective:   Physical Exam  Constitutional: He appears well-developed and well-nourished.  HENT:  Head: Normocephalic and atraumatic.  Right Ear: External ear normal.  Left Ear: External ear normal.  Nose: Nose normal.  Mouth/Throat: Oropharynx is clear and moist.  Eyes: Pupils are equal, round, and reactive to light. EOM are normal.  Neck: Normal range of motion. Neck supple. No thyromegaly present.  Cardiovascular: Normal rate, regular rhythm and normal heart sounds.  No murmur heard. Pulmonary/Chest: Effort normal and breath sounds normal. No respiratory distress. He has no wheezes.  Abdominal: Soft. Bowel sounds are normal. He exhibits no distension and no mass. There is no tenderness.  Genitourinary:  Penis normal.  Musculoskeletal: Normal range of motion. He exhibits no edema.  Lymphadenopathy:    He has no cervical adenopathy.  Neurological: He is  alert. He exhibits normal muscle tone.  Skin: Skin is warm and dry. No erythema.  Psychiatric: He has a normal mood and affect. His behavior is normal. Judgment normal.          Assessment & Plan:  Adult wellness-complete.wellness physical was conducted today. Importance of diet and exercise were discussed in detail.  In addition to this a discussion regarding safety was also covered. We also reviewed over immunizations and gave recommendations regarding current immunization needed for age.  In addition to this additional areas were also touched on including: Preventative health exams needed:  Colonoscopy up-to-date next one is 2022  Patient was advised yearly wellness exam  Patient does have blood pressure issues under good control with current medication continue current medication follow-up by springtime  Hyperlipidemia could do better on his diet this will help get his LDL below 70 continue medication follow-up again in springtime  Hypothyroidism needs adjustment on medicine increased to 112 mcg recheck TSH in approximately 8 weeks follow-up in the spring  Shin Grix prescription in 30 days

## 2018-09-02 NOTE — Patient Instructions (Signed)
Shingrix and shingles prevention: know the facts!   Shingrix is a very effective vaccine to prevent shingles.   Shingles is a reactivation of chickenpox -more than 99% of Americans born before 1980 have had chickenpox even if they do not remember it. One in every 10 people who get shingles have severe long-lasting nerve pain as a result.   33 out of a 100 older adults will get shingles if they are unvaccinated.     This vaccine is very important for your health This vaccine is indicated for anyone 50 years or older. You can get this vaccine even if you have already had shingles because you can get the disease more than once in a lifetime.  Your risk for shingles and its complications increases with age.  This vaccine has 2 doses.  The second dose would be 2 to 6 months after the first dose.  If you had Zostavax vaccine in the past you should still get Shingrix. ( Zostavax is only 70% effective and it loses significant strength over a few years .)  This vaccine is given through the pharmacy.  The cost of the vaccine is through your insurance. The pharmacy can inform you of the total costs.  Common side effects including soreness in the arm, some redness and swelling, also some feel fatigue muscle soreness headache low-grade fever.  Side effects typically go away within 2 to 3 days. Remember-the pain from shingles can last a lifetime but these side effects of the vaccine will only last a few days at most. It is very important to get both doses in order to protect yourself fully.   Please get this vaccine at your earliest convenience at your trusted pharmacy.   Results for orders placed or performed in visit on 08/26/18  TSH  Result Value Ref Range   TSH 7.690 (H) 0.450 - 4.500 uIU/mL  PSA  Result Value Ref Range   Prostate Specific Ag, Serum 2.0 0.0 - 4.0 ng/mL  Basic metabolic panel  Result Value Ref Range   Glucose 115 (H) 65 - 99 mg/dL   BUN 16 8 - 27 mg/dL   Creatinine, Ser  1.61 0.76 - 1.27 mg/dL   GFR calc non Af Amer 75 >59 mL/min/1.73   GFR calc Af Amer 86 >59 mL/min/1.73   BUN/Creatinine Ratio 16 10 - 24   Sodium 140 134 - 144 mmol/L   Potassium 4.2 3.5 - 5.2 mmol/L   Chloride 102 96 - 106 mmol/L   CO2 25 20 - 29 mmol/L   Calcium 9.5 8.6 - 10.2 mg/dL  Hepatic function panel  Result Value Ref Range   Total Protein 6.8 6.0 - 8.5 g/dL   Albumin 4.7 3.6 - 4.8 g/dL   Bilirubin Total 1.7 (H) 0.0 - 1.2 mg/dL   Bilirubin, Direct 0.96 0.00 - 0.40 mg/dL   Alkaline Phosphatase 57 39 - 117 IU/L   AST 20 0 - 40 IU/L   ALT 21 0 - 44 IU/L  Lipid panel  Result Value Ref Range   Cholesterol, Total 159 100 - 199 mg/dL   Triglycerides 045 (H) 0 - 149 mg/dL   HDL 42 >40 mg/dL   VLDL Cholesterol Cal 43 (H) 5 - 40 mg/dL   LDL Calculated 74 0 - 99 mg/dL   Chol/HDL Ratio 3.8 0.0 - 5.0 ratio

## 2018-09-03 ENCOUNTER — Ambulatory Visit: Payer: Medicare Other | Admitting: Family Medicine

## 2018-09-11 ENCOUNTER — Telehealth: Payer: Self-pay | Admitting: Family Medicine

## 2018-09-11 NOTE — Telephone Encounter (Signed)
Please let the patient know I did receive his medical records from [facility].  The biopsy of the stomach was negative for cancer

## 2018-09-13 NOTE — Telephone Encounter (Signed)
Discussed with pt

## 2018-09-29 ENCOUNTER — Telehealth: Payer: Self-pay | Admitting: *Deleted

## 2018-09-29 DIAGNOSIS — E039 Hypothyroidism, unspecified: Secondary | ICD-10-CM

## 2018-09-29 MED ORDER — LEVOTHYROXINE SODIUM 100 MCG PO TABS: 100.0000 ug | ORAL_TABLET | Freq: Every day | ORAL | 0 refills | Status: DC

## 2018-09-29 NOTE — Telephone Encounter (Signed)
Pt states when his levothyroxine was changed from 100mcg to 112 mcg he misplaced his pill bottle and forgot to take for about 2 weeks then when he realized it he started taking the new dose of 112mcg. He has been taking for about 2 weeks now. Headache started and trouble sleeping since taking new dose. Headache comes and goes and mostly happens at night he thinks headache might be the reason he can't sleep. Tylenol dose help the headache. Pt has started back on old dose of 100mcg and states he is sleeping better but still having headache. He thinks it is related to the new dose and would like to see if dr Lorin Picketscott thinks he can continue taking 100mcg.

## 2018-09-29 NOTE — Telephone Encounter (Signed)
Patient advised per Dr Lorin PicketScott: He may go back to the 100 mcg Have the patient always take the medicine first thing in the morning with a glass of water on empty stomach 30 minutes later he may have breakfast He should recheck his TSH in 8 to 12 weeks If the headache does not go away over the next couple weeks or gets worse I would recommend getting it checked out If the headache does not go away then it is very doubtful it is related to his thyroid When he rechecks his TSH if it is in a better range then we do not have to adjust the dose Patient verbalized understanding. Prescription sent electronically to pharmacy. Blood work ordered in [employer].

## 2018-09-29 NOTE — Telephone Encounter (Signed)
He may go back to the 100 mcg Change in medication within epic Have the patient always take the medicine first thing in the morning with a glass of water on empty stomach 30 minutes later he may have breakfast He should recheck his TSH in 8 to 12 weeks If the headache does not go away over the next couple weeks or gets worse I would recommend getting it checked out If the headache does not go away then it is very doubtful it is related to his thyroid  When he rechecks his TSH if it is in a better range then we do not have to adjust the dose

## 2018-10-17 ENCOUNTER — Other Ambulatory Visit: Payer: Self-pay | Admitting: Family Medicine

## 2018-10-29 LAB — TSH: TSH: 2.3 u[IU]/mL (ref 0.450–4.500)

## 2018-11-19 ENCOUNTER — Other Ambulatory Visit: Payer: Self-pay | Admitting: Family Medicine

## 2018-12-20 ENCOUNTER — Telehealth: Payer: Self-pay | Admitting: Family Medicine

## 2018-12-20 MED ORDER — LEVOTHYROXINE SODIUM 100 MCG PO TABS: 100.0000 ug | ORAL_TABLET | Freq: Every day | ORAL | 0 refills | Status: DC

## 2018-12-20 NOTE — Telephone Encounter (Signed)
Patient requesting refill on levothyroxine 100 mcg called into [facility] [place]

## 2018-12-20 NOTE — Telephone Encounter (Signed)
Prescription sent electronically to pharmacy. Patient notified. 

## 2019-01-02 ENCOUNTER — Other Ambulatory Visit: Payer: Self-pay | Admitting: Family Medicine

## 2019-01-05 ENCOUNTER — Telehealth: Payer: Self-pay | Admitting: Family Medicine

## 2019-01-05 NOTE — Telephone Encounter (Signed)
Pt needs Rx for CPAP supplies faxed to [place]  Can we add an addendum to pt's 09/02/2018 OV? The face-to-face OV note must note pt's use and benefit of CPAP therapy - please advise  Please sign order so I may fax with all required documentation  In red folder in basket on wall

## 2019-01-06 NOTE — Telephone Encounter (Signed)
Can we do addendum to 09/02/2018 OV? The face-to-face note must note pt's use and benefit of CPAP therapy or does pt NTBS?  Please advise

## 2019-01-06 NOTE — Telephone Encounter (Signed)
This was signed.

## 2019-01-07 NOTE — Telephone Encounter (Signed)
Additional documentation was put into the chart regarding this for the date of 09/02/2018

## 2019-01-12 NOTE — Telephone Encounter (Signed)
Faxed order to [facility] 

## 2019-02-14 ENCOUNTER — Other Ambulatory Visit: Payer: Self-pay | Admitting: Family Medicine

## 2019-02-23 ENCOUNTER — Other Ambulatory Visit: Payer: Self-pay | Admitting: Family Medicine

## 2019-02-23 NOTE — Telephone Encounter (Signed)
May have 45, no refills, virtual visit in April

## 2019-03-04 ENCOUNTER — Ambulatory Visit: Payer: Medicare Other | Admitting: Family Medicine

## 2019-03-14 ENCOUNTER — Other Ambulatory Visit: Payer: Self-pay | Admitting: Family Medicine

## 2019-03-14 NOTE — Telephone Encounter (Signed)
May have 1 refill needs virtual follow-up medical/medicine visit

## 2019-03-18 ENCOUNTER — Other Ambulatory Visit: Payer: Self-pay | Admitting: Family Medicine

## 2019-03-21 NOTE — Telephone Encounter (Signed)
Needs virtual visit then may have refill

## 2019-04-21 ENCOUNTER — Ambulatory Visit (INDEPENDENT_AMBULATORY_CARE_PROVIDER_SITE_OTHER): Payer: Medicare Other | Admitting: Family Medicine

## 2019-04-21 ENCOUNTER — Other Ambulatory Visit: Payer: Self-pay

## 2019-04-21 ENCOUNTER — Encounter: Payer: Self-pay | Admitting: Family Medicine

## 2019-04-21 DIAGNOSIS — I10 Essential (primary) hypertension: Secondary | ICD-10-CM

## 2019-04-21 DIAGNOSIS — N400 Enlarged prostate without lower urinary tract symptoms: Secondary | ICD-10-CM | POA: Insufficient documentation

## 2019-04-21 DIAGNOSIS — E039 Hypothyroidism, unspecified: Secondary | ICD-10-CM | POA: Diagnosis not present

## 2019-04-21 DIAGNOSIS — E785 Hyperlipidemia, unspecified: Secondary | ICD-10-CM | POA: Diagnosis not present

## 2019-04-21 DIAGNOSIS — Z125 Encounter for screening for malignant neoplasm of prostate: Secondary | ICD-10-CM

## 2019-04-21 DIAGNOSIS — R351 Nocturia: Secondary | ICD-10-CM

## 2019-04-21 DIAGNOSIS — N401 Enlarged prostate with lower urinary tract symptoms: Secondary | ICD-10-CM

## 2019-04-21 DIAGNOSIS — G4700 Insomnia, unspecified: Secondary | ICD-10-CM

## 2019-04-21 HISTORY — DX: Benign prostatic hyperplasia without lower urinary tract symptoms: N40.0

## 2019-04-21 HISTORY — DX: Insomnia, unspecified: G47.00

## 2019-04-21 MED ORDER — METOPROLOL SUCCINATE ER 25 MG PO TB24: 12.5000 mg | ORAL_TABLET | Freq: Every day | ORAL | 1 refills | Status: DC

## 2019-04-21 MED ORDER — ATORVASTATIN CALCIUM 80 MG PO TABS: ORAL_TABLET | ORAL | 1 refills | Status: DC

## 2019-04-21 MED ORDER — TAMSULOSIN HCL 0.4 MG PO CAPS: 0.4000 mg | ORAL_CAPSULE | Freq: Every day | ORAL | 1 refills | Status: DC

## 2019-04-21 MED ORDER — ALPRAZOLAM 0.25 MG PO TABS: 0.2500 mg | ORAL_TABLET | Freq: Every evening | ORAL | 5 refills | Status: DC | PRN

## 2019-04-21 MED ORDER — LEVOTHYROXINE SODIUM 100 MCG PO TABS: 100.0000 ug | ORAL_TABLET | Freq: Every day | ORAL | 1 refills | Status: DC

## 2019-04-21 MED ORDER — LOSARTAN POTASSIUM 50 MG PO TABS: ORAL_TABLET | ORAL | 1 refills | Status: DC

## 2019-04-21 MED ORDER — OMEPRAZOLE 20 MG PO CPDR: DELAYED_RELEASE_CAPSULE | ORAL | 1 refills | Status: DC

## 2019-04-21 NOTE — Progress Notes (Signed)
Subjective:    Patient ID: Mark [name], male    DOB: 07/29/1951, 68 y.o.   MRN: [id number] Video failed Hypertension  This is a chronic problem. Risk factors for coronary artery disease include male gender. There are no compliance problems.   Very nice patient Patient for blood pressure check up.  The patient does have hypertension.  The patient is on medication.  Patient relates compliance with meds. Todays BP reviewed with the patient. Patient denies issues with medication. Patient relates reasonable diet. Patient tries to minimize salt. Patient aware of BP goals.  Patient has thyroid condition.  Takes thyroid medication on a regular basis.  States that the proper way.  Relates compliance.  States no negative side effects.  States condition seems to be under good control.  Patient here for follow-up regarding cholesterol.  The patient does have hyperlipidemia.  Patient does try to maintain a reasonable diet.  Patient does take the medication on a regular basis.  Denies missing a dose.  The patient denies any obvious side effects.  Prior blood work results reviewed with the patient.  The patient is aware of his cholesterol goals and the need to keep it under good control to lessen the risk of disease.  Heart condition stable no sign of any angina or shortness of breath  Insomnia issues uses Xanax half a tablet in the middle of the night at times to help him sleep when he wakes up it does not cause any excessive drowsiness issues  BPH patient continues to use medication does help him with urination only gets up twice at night to urinate states his flow is overall good Pt states he is able to check BP regular. Pt states his BP is normally around 130-140/80.   Patient for blood pressure check up.  The patient does have hypertension.  The patient is on medication.  Patient relates compliance with meds. Todays BP reviewed with the patient. Patient denies issues with medication. Patient  relates reasonable diet. Patient tries to minimize salt. Patient aware of BP goals.   Pt states that he thinks he believes he pulled a muscle in his left leg; but he is walking fine and his leg does not bother him.   Virtual Visit via Video Note  I connected with Mark [name] on 04/21/19 at  8:50 AM EDT by a video enabled telemedicine application and verified that I am speaking with the correct person using two identifiers.  Location: Patient: home Provider: office   I discussed the limitations of evaluation and management by telemedicine and the availability of in person appointments. The patient expressed understanding and agreed to proceed.  History of Present Illness:    Observations/Objective:   Assessment and Plan:   Follow Up Instructions:    I discussed the assessment and treatment plan with the patient. The patient was provided an opportunity to ask questions and all were answered. The patient agreed with the plan and demonstrated an understanding of the instructions.   The patient was advised to call back or seek an in-person evaluation if the symptoms worsen or if the condition fails to improve as anticipated.  I provided 25 minutes of non-face-to-face time during this encounter.   Marlowe [place], LPN    Review of Systems     Objective:   Physical Exam  Today's visit was via telephone Physical exam was not possible for this visit       Assessment & Plan:  HTN- Patient was  seen today as part of a visit regarding hypertension. The importance of healthy diet and regular physical activity was discussed. The importance of compliance with medications discussed.  Ideal goal is to keep blood pressure low elevated levels certainly below 140/90 when possible.  The patient was counseled that keeping blood pressure under control lessen his risk of complications.  The importance of regular follow-ups was discussed with the patient.  Low-salt diet such as DASH  recommended.  Regular physical activity was recommended as well.  Patient was advised to keep regular follow-ups.  The patient was seen today as part of an evaluation regarding hyperlipidemia.  Recent lab work has been reviewed with the patient as well as the goals for good cholesterol care.  In addition to this medications have been discussed the importance of compliance with diet and medications discussed as well.  Finally the patient is aware that poor control of cholesterol, noncompliance can dramatically increase the risk of complications. The patient will keep regular office visits and the patient does agreed to periodic lab work.  Patient was seen today regarding hypothyroidism.  Importance of healthy diet, regular physical activity was discussed.  Importance of compliance with medication and regular checks regarding this was discussed.   Insomnia uses Xanax halfway through the night does not cause daytime drowsiness continue medication  He uses this because he has difficult time falling back asleep if he does not take it  BPH uses tamsulosin it does help him with his urinary flow  Lab work in follow-up office visit toward the late fall

## 2019-04-27 ENCOUNTER — Ambulatory Visit: Payer: Medicare Other | Admitting: Family Medicine

## 2019-07-05 ENCOUNTER — Other Ambulatory Visit: Payer: Self-pay | Admitting: Family Medicine

## 2019-08-13 LAB — BASIC METABOLIC PANEL
BUN/Creatinine Ratio: 13 (ref 10–24)
BUN: 14 mg/dL (ref 8–27)
CO2: 25 mmol/L (ref 20–29)
Calcium: 9.2 mg/dL (ref 8.6–10.2)
Chloride: 102 mmol/L (ref 96–106)
Creatinine, Ser: 1.04 mg/dL (ref 0.76–1.27)
GFR calc Af Amer: 85 mL/min/{1.73_m2} (ref >59)
GFR calc non Af Amer: 73 mL/min/{1.73_m2} (ref >59)
Glucose: 112 mg/dL — ABNORMAL HIGH (ref 65–99)
Potassium: 4.3 mmol/L (ref 3.5–5.2)
Sodium: 138 mmol/L (ref 134–144)

## 2019-08-13 LAB — HEPATIC FUNCTION PANEL
ALT: 18 IU/L (ref 0–44)
AST: 18 IU/L (ref 0–40)
Albumin: 4.3 g/dL (ref 3.8–4.8)
Alkaline Phosphatase: 57 IU/L (ref 39–117)
Bilirubin Total: 1.7 mg/dL — ABNORMAL HIGH (ref 0.0–1.2)
Bilirubin, Direct: 0.31 mg/dL (ref 0.00–0.40)
Total Protein: 6.6 g/dL (ref 6.0–8.5)

## 2019-08-13 LAB — LIPID PANEL
Chol/HDL Ratio: 3.5 ratio (ref 0.0–5.0)
Cholesterol, Total: 133 mg/dL (ref 100–199)
HDL: 38 mg/dL — ABNORMAL LOW (ref >39)
LDL Chol Calc (NIH): 59 mg/dL (ref 0–99)
Triglycerides: 224 mg/dL — ABNORMAL HIGH (ref 0–149)
VLDL Cholesterol Cal: 36 mg/dL (ref 5–40)

## 2019-08-13 LAB — TSH: TSH: 4.18 u[IU]/mL (ref 0.450–4.500)

## 2019-08-13 LAB — PSA: Prostate Specific Ag, Serum: 2.2 ng/mL (ref 0.0–4.0)

## 2019-09-15 ENCOUNTER — Ambulatory Visit (INDEPENDENT_AMBULATORY_CARE_PROVIDER_SITE_OTHER): Payer: Medicare Other | Admitting: Family Medicine

## 2019-09-15 ENCOUNTER — Encounter: Payer: Self-pay | Admitting: Family Medicine

## 2019-09-15 ENCOUNTER — Other Ambulatory Visit: Payer: Self-pay

## 2019-09-15 VITALS — Temp 97.4°F | Ht 70.5 in | Wt 206.0 lb

## 2019-09-15 DIAGNOSIS — E785 Hyperlipidemia, unspecified: Secondary | ICD-10-CM

## 2019-09-15 DIAGNOSIS — I10 Essential (primary) hypertension: Secondary | ICD-10-CM | POA: Diagnosis not present

## 2019-09-15 DIAGNOSIS — Z0000 Encounter for general adult medical examination without abnormal findings: Secondary | ICD-10-CM | POA: Diagnosis not present

## 2019-09-15 NOTE — Progress Notes (Signed)
Subjective:    Patient ID: Mark [name], male    DOB: 07/29/1951, 68 y.o.   MRN: [id number]  HPI AWV- Annual Wellness Visit  The patient was seen for their annual wellness visit. The patient's past medical history, surgical history, and family history were reviewed. Pertinent vaccines were reviewed ( tetanus, pneumonia, shingles, flu) The patient's medication list was reviewed and updated.  The height and weight were entered.  BMI recorded in electronic record elsewhere  Cognitive screening was completed. Outcome of Mini - Cog: pass   Falls /depression screening electronically recorded within record elsewhere  Current tobacco usage: none (All patients who use tobacco were given written and verbal information on quitting)  Recent listing of emergency department/hospitalizations over the past year were reviewed.  current specialist the patient sees on a regular basis: none  Medicare annual wellness visit patient questionnaire was reviewed.  A written screening schedule for the patient for the next 5-10 years was given. Appropriate discussion of followup regarding next visit was discussed.  Declines flu vaccine today since he will be on vacation but states he will schedule nurse visit in November to get one.  Patient has history of hyperlipidemia and hypertension is taking his medicines watching salt diet minimizes the fats try to get some walking and takes his medicines recent lab work overall looks improved a.m. good he denies any cardiac symptoms no chest tightness pressure pain   Concerns about congestion. States he has had this for years. Seems like it is worse after eating. Worse the last 6 months. Uses albuterol inhaler more the last few months.  Patient describes this as a congestion in the upper chest and the back of the throat that occurs after eating he states he is often coughing after he eats he denies wheezing but he does state he uses the albuterol to try to  help Results for orders placed or performed in visit on 04/21/19  PSA  Result Value Ref Range   Prostate Specific Ag, Serum 2.2 0.0 - 4.0 ng/mL  Basic Metabolic Panel (BMET)  Result Value Ref Range   Glucose 112 (H) 65 - 99 mg/dL   BUN 14 8 - 27 mg/dL   Creatinine, Ser 8.50 0.76 - 1.27 mg/dL   GFR calc non Af Amer 73 >59 mL/min/1.73   GFR calc Af Amer 85 >59 mL/min/1.73   BUN/Creatinine Ratio 13 10 - 24   Sodium 138 134 - 144 mmol/L   Potassium 4.3 3.5 - 5.2 mmol/L   Chloride 102 96 - 106 mmol/L   CO2 25 20 - 29 mmol/L   Calcium 9.2 8.6 - 10.2 mg/dL  Lipid Profile  Result Value Ref Range   Cholesterol, Total 133 100 - 199 mg/dL   Triglycerides 277 (H) 0 - 149 mg/dL   HDL 38 (L) >41 mg/dL   VLDL Cholesterol Cal 36 5 - 40 mg/dL   LDL Chol Calc (NIH) 59 0 - 99 mg/dL   Chol/HDL Ratio 3.5 0.0 - 5.0 ratio  Hepatic function panel  Result Value Ref Range   Total Protein 6.6 6.0 - 8.5 g/dL   Albumin 4.3 3.8 - 4.8 g/dL   Bilirubin Total 1.7 (H) 0.0 - 1.2 mg/dL   Bilirubin, Direct 2.87 0.00 - 0.40 mg/dL   Alkaline Phosphatase 57 39 - 117 IU/L   AST 18 0 - 40 IU/L   ALT 18 0 - 44 IU/L  TSH  Result Value Ref Range   TSH 4.180 0.450 - 4.500  uIU/mL     Review of Systems  Constitutional: Negative for activity change, appetite change and fever.  HENT: Negative for congestion and rhinorrhea.   Eyes: Negative for discharge.  Respiratory: Negative for cough and wheezing.   Cardiovascular: Negative for chest pain.  Gastrointestinal: Negative for abdominal pain, blood in stool and vomiting.  Genitourinary: Negative for difficulty urinating and frequency.  Musculoskeletal: Negative for neck pain.  Skin: Negative for rash.  Allergic/Immunologic: Negative for environmental allergies and food allergies.  Neurological: Negative for weakness and headaches.  Psychiatric/Behavioral: Negative for agitation.       Objective:   Physical Exam Constitutional:      Appearance: He is  well-developed.  HENT:     Head: Normocephalic and atraumatic.     Right Ear: External ear normal.     Left Ear: External ear normal.     Nose: Nose normal.  Eyes:     Pupils: Pupils are equal, round, and reactive to light.  Neck:     Musculoskeletal: Normal range of motion and neck supple.     Thyroid: No thyromegaly.  Cardiovascular:     Rate and Rhythm: Normal rate and regular rhythm.     Heart sounds: Normal heart sounds. No murmur.  Pulmonary:     Effort: Pulmonary effort is normal. No respiratory distress.     Breath sounds: Normal breath sounds. No wheezing.  Abdominal:     General: Bowel sounds are normal. There is no distension.     Palpations: Abdomen is soft. There is no mass.     Tenderness: There is no abdominal tenderness.  Genitourinary:    Penis: Normal.   Musculoskeletal: Normal range of motion.  Lymphadenopathy:     Cervical: No cervical adenopathy.  Skin:    General: Skin is warm and dry.     Findings: No erythema.  Neurological:     Mental Status: He is alert.     Motor: No abnormal muscle tone.  Psychiatric:        Behavior: Behavior normal.        Judgment: Judgment normal.    Prostate exam normal       Assessment & Plan:  Adult wellness-complete.wellness physical was conducted today. Importance of diet and exercise were discussed in detail.  In addition to this a discussion regarding safety was also covered. We also reviewed over immunizations and gave recommendations regarding current immunization needed for age.  In addition to this additional areas were also touched on including: Preventative health exams needed:  Colonoscopy 2022  Patient was advised yearly wellness exam  HTN good control taking his medicine minimizing salt diet staying active  Hyperlipidemia labs look good taking his medicine not having any problems  Upper airway congestion more than likely this is just some slight reflux or regurgitation it is occurring or  potentially even some slight aspiration I would not add any medicines he states this sometimes happens at night as well when he is using his CPAP I encouraged him to put the head of bed on 6 inch blocks

## 2019-09-29 ENCOUNTER — Other Ambulatory Visit: Payer: Medicare Other

## 2019-10-05 ENCOUNTER — Other Ambulatory Visit (INDEPENDENT_AMBULATORY_CARE_PROVIDER_SITE_OTHER): Payer: Medicare Other | Admitting: *Deleted

## 2019-10-05 ENCOUNTER — Other Ambulatory Visit: Payer: Self-pay

## 2019-10-05 DIAGNOSIS — Z23 Encounter for immunization: Secondary | ICD-10-CM

## 2019-11-03 ENCOUNTER — Other Ambulatory Visit: Payer: Self-pay | Admitting: Family Medicine

## 2019-11-04 ENCOUNTER — Other Ambulatory Visit: Payer: Self-pay | Admitting: Family Medicine

## 2019-11-04 NOTE — Telephone Encounter (Signed)
6 mo all 

## 2019-12-09 ENCOUNTER — Other Ambulatory Visit: Payer: Self-pay | Admitting: Family Medicine

## 2019-12-22 ENCOUNTER — Telehealth: Payer: Self-pay | Admitting: Family Medicine

## 2019-12-22 NOTE — Telephone Encounter (Signed)
Pt needs Rx for CPAP supplies faxed to [facility].  Insurance changed & previous DME company does not accept new plan  Please fax Rx for CPAP supplies along with patient's demographics to [facility]  Ph# [phone] Fx# [phone]

## 2019-12-23 NOTE — Telephone Encounter (Signed)
This was signed as requested thank you 

## 2019-12-23 NOTE — Telephone Encounter (Signed)
Faxed order

## 2019-12-23 NOTE — Telephone Encounter (Signed)
Please sign the order for CPAP supplies so I may fax  In red folder in basket on wall  

## 2019-12-27 DIAGNOSIS — G4733 Obstructive sleep apnea (adult) (pediatric): Secondary | ICD-10-CM | POA: Diagnosis not present

## 2020-01-08 ENCOUNTER — Other Ambulatory Visit: Payer: Self-pay | Admitting: Family Medicine

## 2020-01-26 ENCOUNTER — Other Ambulatory Visit: Payer: Self-pay | Admitting: Family Medicine

## 2020-01-26 NOTE — Telephone Encounter (Signed)
Last med checkup 09/15/19

## 2020-02-12 ENCOUNTER — Other Ambulatory Visit: Payer: Self-pay | Admitting: Family Medicine

## 2020-03-08 ENCOUNTER — Other Ambulatory Visit: Payer: Self-pay | Admitting: Family Medicine

## 2020-03-13 ENCOUNTER — Telehealth: Payer: Self-pay | Admitting: Family Medicine

## 2020-03-13 NOTE — Telephone Encounter (Signed)
Last lab 08/12/19 lipid, liver, tsh, bmp, psa

## 2020-03-13 NOTE — Telephone Encounter (Signed)
Pt has an appt tomorrow and would like to have lab work done in the morning before appt.

## 2020-03-14 ENCOUNTER — Other Ambulatory Visit: Payer: Self-pay

## 2020-03-14 ENCOUNTER — Ambulatory Visit (INDEPENDENT_AMBULATORY_CARE_PROVIDER_SITE_OTHER): Payer: Medicare PPO | Admitting: Family Medicine

## 2020-03-14 VITALS — BP 132/70 | Temp 97.9°F | Wt 207.2 lb

## 2020-03-14 DIAGNOSIS — E039 Hypothyroidism, unspecified: Secondary | ICD-10-CM | POA: Diagnosis not present

## 2020-03-14 DIAGNOSIS — E785 Hyperlipidemia, unspecified: Secondary | ICD-10-CM | POA: Diagnosis not present

## 2020-03-14 DIAGNOSIS — Z79899 Other long term (current) drug therapy: Secondary | ICD-10-CM | POA: Diagnosis not present

## 2020-03-14 DIAGNOSIS — I10 Essential (primary) hypertension: Secondary | ICD-10-CM

## 2020-03-14 NOTE — Patient Instructions (Addendum)
DASH Eating Plan DASH stands for "Dietary Approaches to Stop Hypertension." The DASH eating plan is a healthy eating plan that has been shown to reduce high blood pressure (hypertension). It may also reduce your risk for type 2 diabetes, heart disease, and stroke. The DASH eating plan may also help with weight loss. What are tips for following this plan?  General guidelines  Avoid eating more than 2,300 mg (milligrams) of salt (sodium) a day. If you have hypertension, you may need to reduce your sodium intake to 1,500 mg a day.  Limit alcohol intake to no more than 1 drink a day for nonpregnant women and 2 drinks a day for men. One drink equals 12 oz of beer, 5 oz of wine, or 1 oz of hard liquor.  Work with your health care provider to maintain a healthy body weight or to lose weight. Ask what an ideal weight is for you.  Get at least 30 minutes of exercise that causes your heart to beat faster (aerobic exercise) most days of the week. Activities may include walking, swimming, or biking.  Work with your health care provider or diet and nutrition specialist (dietitian) to adjust your eating plan to your individual calorie needs. Reading food labels   Check food labels for the amount of sodium per serving. Choose foods with less than 5 percent of the Daily Value of sodium. Generally, foods with less than 300 mg of sodium per serving fit into this eating plan.  To find whole grains, look for the word "whole" as the first word in the ingredient list. Shopping  Buy products labeled as "low-sodium" or "no salt added."  Buy fresh foods. Avoid canned foods and premade or frozen meals. Cooking  Avoid adding salt when cooking. Use salt-free seasonings or herbs instead of table salt or sea salt. Check with your health care provider or pharmacist before using salt substitutes.  Do not fry foods. Cook foods using healthy methods such as baking, boiling, grilling, and broiling instead.  Cook with  heart-healthy oils, such as olive, canola, soybean, or sunflower oil. Meal planning  Eat a balanced diet that includes: ? 5 or more servings of fruits and vegetables each day. At each meal, try to fill half of your plate with fruits and vegetables. ? Up to 6-8 servings of whole grains each day. ? Less than 6 oz of lean meat, poultry, or fish each day. A 3-oz serving of meat is about the same size as a deck of cards. One egg equals 1 oz. ? 2 servings of low-fat dairy each day. ? A serving of nuts, seeds, or beans 5 times each week. ? Heart-healthy fats. Healthy fats called Omega-3 fatty acids are found in foods such as flaxseeds and coldwater fish, like sardines, salmon, and mackerel.  Limit how much you eat of the following: ? Canned or prepackaged foods. ? Food that is high in trans fat, such as fried foods. ? Food that is high in saturated fat, such as fatty meat. ? Sweets, desserts, sugary drinks, and other foods with added sugar. ? Full-fat dairy products.  Do not salt foods before eating.  Try to eat at least 2 vegetarian meals each week.  Eat more home-cooked food and less restaurant, buffet, and fast food.  When eating at a restaurant, ask that your food be prepared with less salt or no salt, if possible. What foods are recommended? The items listed may not be a complete list. Talk with your dietitian about   what dietary choices are best for you. Grains Whole-grain or whole-wheat bread. Whole-grain or whole-wheat pasta. Brown rice. Oatmeal. Quinoa. Bulgur. Whole-grain and low-sodium cereals. Pita bread. Low-fat, low-sodium crackers. Whole-wheat flour tortillas. Vegetables Fresh or frozen vegetables (raw, steamed, roasted, or grilled). Low-sodium or reduced-sodium tomato and vegetable juice. Low-sodium or reduced-sodium tomato sauce and tomato paste. Low-sodium or reduced-sodium canned vegetables. Fruits All fresh, dried, or frozen fruit. Canned fruit in natural juice (without  added sugar). Meat and other protein foods Skinless chicken or turkey. Ground chicken or turkey. Pork with fat trimmed off. Fish and seafood. Egg whites. Dried beans, peas, or lentils. Unsalted nuts, nut butters, and seeds. Unsalted canned beans. Lean cuts of beef with fat trimmed off. Low-sodium, lean deli meat. Dairy Low-fat (1%) or fat-free (skim) milk. Fat-free, low-fat, or reduced-fat cheeses. Nonfat, low-sodium ricotta or cottage cheese. Low-fat or nonfat yogurt. Low-fat, low-sodium cheese. Fats and oils Soft margarine without trans fats. Vegetable oil. Low-fat, reduced-fat, or light mayonnaise and salad dressings (reduced-sodium). Canola, safflower, olive, soybean, and sunflower oils. Avocado. Seasoning and other foods Herbs. Spices. Seasoning mixes without salt. Unsalted popcorn and pretzels. Fat-free sweets. What foods are not recommended? The items listed may not be a complete list. Talk with your dietitian about what dietary choices are best for you. Grains Baked goods made with fat, such as croissants, muffins, or some breads. Dry pasta or rice meal packs. Vegetables Creamed or fried vegetables. Vegetables in a cheese sauce. Regular canned vegetables (not low-sodium or reduced-sodium). Regular canned tomato sauce and paste (not low-sodium or reduced-sodium). Regular tomato and vegetable juice (not low-sodium or reduced-sodium). Pickles. Olives. Fruits Canned fruit in a light or heavy syrup. Fried fruit. Fruit in cream or butter sauce. Meat and other protein foods Fatty cuts of meat. Ribs. Fried meat. Bacon. Sausage. Bologna and other processed lunch meats. Salami. Fatback. Hotdogs. Bratwurst. Salted nuts and seeds. Canned beans with added salt. Canned or smoked fish. Whole eggs or egg yolks. Chicken or turkey with skin. Dairy Whole or 2% milk, cream, and half-and-half. Whole or full-fat cream cheese. Whole-fat or sweetened yogurt. Full-fat cheese. Nondairy creamers. Whipped toppings.  Processed cheese and cheese spreads. Fats and oils Butter. Stick margarine. Lard. Shortening. Ghee. Bacon fat. Tropical oils, such as coconut, palm kernel, or palm oil. Seasoning and other foods Salted popcorn and pretzels. Onion salt, garlic salt, seasoned salt, table salt, and sea salt. Worcestershire sauce. Tartar sauce. Barbecue sauce. Teriyaki sauce. Soy sauce, including reduced-sodium. Steak sauce. Canned and packaged gravies. Fish sauce. Oyster sauce. Cocktail sauce. Horseradish that you find on the shelf. Ketchup. Mustard. Meat flavorings and tenderizers. Bouillon cubes. Hot sauce and Tabasco sauce. Premade or packaged marinades. Premade or packaged taco seasonings. Relishes. Regular salad dressings. Where to find more information:  National Heart, Lung, and Blood Institute: www.nhlbi.nih.gov  American Heart Association: www.heart.org Summary  The DASH eating plan is a healthy eating plan that has been shown to reduce high blood pressure (hypertension). It may also reduce your risk for type 2 diabetes, heart disease, and stroke.  With the DASH eating plan, you should limit salt (sodium) intake to 2,300 mg a day. If you have hypertension, you may need to reduce your sodium intake to 1,500 mg a day.  When on the DASH eating plan, aim to eat more fresh fruits and vegetables, whole grains, lean proteins, low-fat dairy, and heart-healthy fats.  Work with your health care provider or diet and nutrition specialist (dietitian) to adjust your eating plan to your   individual calorie needs. This information is not intended to replace advice given to you by your health care provider. Make sure you discuss any questions you have with your health care provider. Document Revised: 10/16/2017 Document Reviewed: 10/27/2016 Elsevier Patient Education  2020 [employer]  DASH Eating Plan DASH stands for "Dietary Approaches to Stop Hypertension." The DASH eating plan is a healthy eating plan that has  been shown to reduce high blood pressure (hypertension). It may also reduce your risk for type 2 diabetes, heart disease, and stroke. The DASH eating plan may also help with weight loss. What are tips for following this plan?  General guidelines  Avoid eating more than 2,300 mg (milligrams) of salt (sodium) a day. If you have hypertension, you may need to reduce your sodium intake to 1,500 mg a day.  Limit alcohol intake to no more than 1 drink a day for nonpregnant women and 2 drinks a day for men. One drink equals 12 oz of beer, 5 oz of wine, or 1 oz of hard liquor.  Work with your health care provider to maintain a healthy body weight or to lose weight. Ask what an ideal weight is for you.  Get at least 30 minutes of exercise that causes your heart to beat faster (aerobic exercise) most days of the week. Activities may include walking, swimming, or biking.  Work with your health care provider or diet and nutrition specialist (dietitian) to adjust your eating plan to your individual calorie needs. Reading food labels   Check food labels for the amount of sodium per serving. Choose foods with less than 5 percent of the Daily Value of sodium. Generally, foods with less than 300 mg of sodium per serving fit into this eating plan.  To find whole grains, look for the word "whole" as the first word in the ingredient list. Shopping  Buy products labeled as "low-sodium" or "no salt added."  Buy fresh foods. Avoid canned foods and premade or frozen meals. Cooking  Avoid adding salt when cooking. Use salt-free seasonings or herbs instead of table salt or sea salt. Check with your health care provider or pharmacist before using salt substitutes.  Do not fry foods. Cook foods using healthy methods such as baking, boiling, grilling, and broiling instead.  Cook with heart-healthy oils, such as olive, canola, soybean, or sunflower oil. Meal planning  Eat a balanced diet that includes: ? 5 or  more servings of fruits and vegetables each day. At each meal, try to fill half of your plate with fruits and vegetables. ? Up to 6-8 servings of whole grains each day. ? Less than 6 oz of lean meat, poultry, or fish each day. A 3-oz serving of meat is about the same size as a deck of cards. One egg equals 1 oz. ? 2 servings of low-fat dairy each day. ? A serving of nuts, seeds, or beans 5 times each week. ? Heart-healthy fats. Healthy fats called Omega-3 fatty acids are found in foods such as flaxseeds and coldwater fish, like sardines, salmon, and mackerel.  Limit how much you eat of the following: ? Canned or prepackaged foods. ? Food that is high in trans fat, such as fried foods. ? Food that is high in saturated fat, such as fatty meat. ? Sweets, desserts, sugary drinks, and other foods with added sugar. ? Full-fat dairy products.  Do not salt foods before eating.  Try to eat at least 2 vegetarian meals each week.  Eat more   home-cooked food and less restaurant, buffet, and fast food.  When eating at a restaurant, ask that your food be prepared with less salt or no salt, if possible. What foods are recommended? The items listed may not be a complete list. Talk with your dietitian about what dietary choices are best for you. Grains Whole-grain or whole-wheat bread. Whole-grain or whole-wheat pasta. Brown rice. Oatmeal. Quinoa. Bulgur. Whole-grain and low-sodium cereals. Pita bread. Low-fat, low-sodium crackers. Whole-wheat flour tortillas. Vegetables Fresh or frozen vegetables (raw, steamed, roasted, or grilled). Low-sodium or reduced-sodium tomato and vegetable juice. Low-sodium or reduced-sodium tomato sauce and tomato paste. Low-sodium or reduced-sodium canned vegetables. Fruits All fresh, dried, or frozen fruit. Canned fruit in natural juice (without added sugar). Meat and other protein foods Skinless chicken or turkey. Ground chicken or turkey. Pork with fat trimmed off. Fish  and seafood. Egg whites. Dried beans, peas, or lentils. Unsalted nuts, nut butters, and seeds. Unsalted canned beans. Lean cuts of beef with fat trimmed off. Low-sodium, lean deli meat. Dairy Low-fat (1%) or fat-free (skim) milk. Fat-free, low-fat, or reduced-fat cheeses. Nonfat, low-sodium ricotta or cottage cheese. Low-fat or nonfat yogurt. Low-fat, low-sodium cheese. Fats and oils Soft margarine without trans fats. Vegetable oil. Low-fat, reduced-fat, or light mayonnaise and salad dressings (reduced-sodium). Canola, safflower, olive, soybean, and sunflower oils. Avocado. Seasoning and other foods Herbs. Spices. Seasoning mixes without salt. Unsalted popcorn and pretzels. Fat-free sweets. What foods are not recommended? The items listed may not be a complete list. Talk with your dietitian about what dietary choices are best for you. Grains Baked goods made with fat, such as croissants, muffins, or some breads. Dry pasta or rice meal packs. Vegetables Creamed or fried vegetables. Vegetables in a cheese sauce. Regular canned vegetables (not low-sodium or reduced-sodium). Regular canned tomato sauce and paste (not low-sodium or reduced-sodium). Regular tomato and vegetable juice (not low-sodium or reduced-sodium). Pickles. Olives. Fruits Canned fruit in a light or heavy syrup. Fried fruit. Fruit in cream or butter sauce. Meat and other protein foods Fatty cuts of meat. Ribs. Fried meat. Bacon. Sausage. Bologna and other processed lunch meats. Salami. Fatback. Hotdogs. Bratwurst. Salted nuts and seeds. Canned beans with added salt. Canned or smoked fish. Whole eggs or egg yolks. Chicken or turkey with skin. Dairy Whole or 2% milk, cream, and half-and-half. Whole or full-fat cream cheese. Whole-fat or sweetened yogurt. Full-fat cheese. Nondairy creamers. Whipped toppings. Processed cheese and cheese spreads. Fats and oils Butter. Stick margarine. Lard. Shortening. Ghee. Bacon fat. Tropical oils,  such as coconut, palm kernel, or palm oil. Seasoning and other foods Salted popcorn and pretzels. Onion salt, garlic salt, seasoned salt, table salt, and sea salt. Worcestershire sauce. Tartar sauce. Barbecue sauce. Teriyaki sauce. Soy sauce, including reduced-sodium. Steak sauce. Canned and packaged gravies. Fish sauce. Oyster sauce. Cocktail sauce. Horseradish that you find on the shelf. Ketchup. Mustard. Meat flavorings and tenderizers. Bouillon cubes. Hot sauce and Tabasco sauce. Premade or packaged marinades. Premade or packaged taco seasonings. Relishes. Regular salad dressings. Where to find more information:  National Heart, Lung, and Blood Institute: www.nhlbi.nih.gov  American Heart Association: www.heart.org Summary  The DASH eating plan is a healthy eating plan that has been shown to reduce high blood pressure (hypertension). It may also reduce your risk for type 2 diabetes, heart disease, and stroke.  With the DASH eating plan, you should limit salt (sodium) intake to 2,300 mg a day. If you have hypertension, you may need to reduce your sodium intake to 1,500   mg a day.  When on the DASH eating plan, aim to eat more fresh fruits and vegetables, whole grains, lean proteins, low-fat dairy, and heart-healthy fats.  Work with your health care provider or diet and nutrition specialist (dietitian) to adjust your eating plan to your individual calorie needs. This information is not intended to replace advice given to you by your health care provider. Make sure you discuss any questions you have with your health care provider. Document Revised: 10/16/2017 Document Reviewed: 10/27/2016 Elsevier Patient Education  2020 Elsevier Inc.  

## 2020-03-14 NOTE — Telephone Encounter (Signed)
Patient was given lab papers this morning

## 2020-03-14 NOTE — Progress Notes (Signed)
   Subjective:    Patient ID: Mark [name], male    DOB: September 11, 1951, 69 y.o.   MRN: [id number]  HPI Patient comes in today for a follow up on hypertension. Patient states he eats too much salt in his diet and it causes blood pressure to go up Patient states his blood pressure has been running high since starting Zyrtec for allergies. He is only taking 1/2 tablet daily.  Patient also has complaints of worsening arthritis.  Patient had worsening arthritis in his hands Go the bathroom is sort impossible to go back to sleep, with sleep 1. Essential hypertension, benign Watch diet stay active see below - Basic metabolic panel  2. Hypothyroidism, unspecified type Take medication check labs patient feels things are going well with this previous labs reviewed - TSH  3. Hyperlipidemia, unspecified hyperlipidemia type Patient admits to eating too much fats and salts in diet encourage better diet - Lipid panel  4. High risk medication use Check labs - Hepatic function panel  Review of Systems  Constitutional: Negative for diaphoresis and fatigue.  HENT: Negative for congestion and rhinorrhea.   Respiratory: Negative for cough and shortness of breath.   Cardiovascular: Negative for chest pain and leg swelling.  Gastrointestinal: Negative for abdominal pain and diarrhea.  Skin: Negative for color change and rash.  Neurological: Negative for dizziness and headaches.  Psychiatric/Behavioral: Negative for behavioral problems and confusion.       Objective:   Physical Exam Vitals reviewed.  Constitutional:      General: He is not in acute distress. HENT:     Head: Normocephalic and atraumatic.  Eyes:     General:        Right eye: No discharge.        Left eye: No discharge.  Neck:     Trachea: No tracheal deviation.  Cardiovascular:     Rate and Rhythm: Normal rate and regular rhythm.     Heart sounds: Normal heart sounds. No murmur.  Pulmonary:     Effort: Pulmonary  effort is normal. No respiratory distress.     Breath sounds: Normal breath sounds.  Lymphadenopathy:     Cervical: No cervical adenopathy.  Skin:    General: Skin is warm and dry.  Neurological:     Mental Status: He is alert.     Coordination: Coordination normal.  Psychiatric:        Behavior: Behavior normal.           Assessment & Plan:  1. Essential hypertension, benign Blood pressure on recheck is good continue current measures - Basic metabolic panel  2. Hypothyroidism, unspecified type Check lab work on thyroid watch diet take medication as directed - TSH  3. Hyperlipidemia, unspecified hyperlipidemia type Watch diet minimize fats in the diet check lab work await the results - Lipid panel  4. 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  He already takes his Xanax at nighttime to help him sleep.  30 minutes spent between issues refills ordering labs reviewing labs and documentation  Wellness and follow-up on chronic health problems by the end of October

## 2020-03-23 ENCOUNTER — Other Ambulatory Visit: Payer: Self-pay | Admitting: Family Medicine

## 2020-04-02 DIAGNOSIS — E039 Hypothyroidism, unspecified: Secondary | ICD-10-CM | POA: Diagnosis not present

## 2020-04-02 DIAGNOSIS — E785 Hyperlipidemia, unspecified: Secondary | ICD-10-CM | POA: Diagnosis not present

## 2020-04-02 DIAGNOSIS — I10 Essential (primary) hypertension: Secondary | ICD-10-CM | POA: Diagnosis not present

## 2020-04-02 DIAGNOSIS — Z79899 Other long term (current) drug therapy: Secondary | ICD-10-CM | POA: Diagnosis not present

## 2020-04-03 LAB — LIPID PANEL
Chol/HDL Ratio: 3.5 ratio (ref 0.0–5.0)
Cholesterol, Total: 137 mg/dL (ref 100–199)
HDL: 39 mg/dL — ABNORMAL LOW (ref >39)
LDL Chol Calc (NIH): 58 mg/dL (ref 0–99)
Triglycerides: 248 mg/dL — ABNORMAL HIGH (ref 0–149)
VLDL Cholesterol Cal: 40 mg/dL (ref 5–40)

## 2020-04-03 LAB — BASIC METABOLIC PANEL
BUN/Creatinine Ratio: 15 (ref 10–24)
BUN: 15 mg/dL (ref 8–27)
CO2: 22 mmol/L (ref 20–29)
Calcium: 9.2 mg/dL (ref 8.6–10.2)
Chloride: 102 mmol/L (ref 96–106)
Creatinine, Ser: 0.98 mg/dL (ref 0.76–1.27)
GFR calc Af Amer: 91 mL/min/{1.73_m2} (ref >59)
GFR calc non Af Amer: 78 mL/min/{1.73_m2} (ref >59)
Glucose: 106 mg/dL — ABNORMAL HIGH (ref 65–99)
Potassium: 4.2 mmol/L (ref 3.5–5.2)
Sodium: 140 mmol/L (ref 134–144)

## 2020-04-03 LAB — HEPATIC FUNCTION PANEL
ALT: 19 IU/L (ref 0–44)
AST: 20 IU/L (ref 0–40)
Albumin: 4.2 g/dL (ref 3.8–4.8)
Alkaline Phosphatase: 55 IU/L (ref 48–121)
Bilirubin Total: 1.6 mg/dL — ABNORMAL HIGH (ref 0.0–1.2)
Bilirubin, Direct: 0.33 mg/dL (ref 0.00–0.40)
Total Protein: 6.4 g/dL (ref 6.0–8.5)

## 2020-04-03 LAB — TSH: TSH: 2.96 u[IU]/mL (ref 0.450–4.500)

## 2020-04-04 DIAGNOSIS — G4733 Obstructive sleep apnea (adult) (pediatric): Secondary | ICD-10-CM | POA: Diagnosis not present

## 2020-04-17 DIAGNOSIS — D1801 Hemangioma of skin and subcutaneous tissue: Secondary | ICD-10-CM | POA: Diagnosis not present

## 2020-04-17 DIAGNOSIS — Z85828 Personal history of other malignant neoplasm of skin: Secondary | ICD-10-CM | POA: Diagnosis not present

## 2020-04-17 DIAGNOSIS — L814 Other melanin hyperpigmentation: Secondary | ICD-10-CM | POA: Diagnosis not present

## 2020-04-17 DIAGNOSIS — L80 Vitiligo: Secondary | ICD-10-CM | POA: Diagnosis not present

## 2020-04-17 DIAGNOSIS — L570 Actinic keratosis: Secondary | ICD-10-CM | POA: Diagnosis not present

## 2020-04-17 DIAGNOSIS — L821 Other seborrheic keratosis: Secondary | ICD-10-CM | POA: Diagnosis not present

## 2020-04-17 DIAGNOSIS — L281 Prurigo nodularis: Secondary | ICD-10-CM | POA: Diagnosis not present

## 2020-05-20 ENCOUNTER — Other Ambulatory Visit: Payer: Self-pay | Admitting: Family Medicine

## 2020-05-22 ENCOUNTER — Other Ambulatory Visit: Payer: Self-pay | Admitting: Family Medicine

## 2020-06-05 ENCOUNTER — Other Ambulatory Visit: Payer: Self-pay | Admitting: Family Medicine

## 2020-06-11 ENCOUNTER — Other Ambulatory Visit: Payer: Self-pay | Admitting: Family Medicine

## 2020-06-12 ENCOUNTER — Other Ambulatory Visit: Payer: Self-pay | Admitting: Family Medicine

## 2020-06-14 ENCOUNTER — Telehealth: Payer: Self-pay | Admitting: Family Medicine

## 2020-06-14 MED ORDER — METOPROLOL SUCCINATE ER 25 MG PO TB24: ORAL_TABLET | ORAL | 1 refills | Status: DC

## 2020-06-14 NOTE — Telephone Encounter (Signed)
Refills sent to pharmacy. 

## 2020-06-14 NOTE — Telephone Encounter (Signed)
[facility] requesting refill on Metoprolol 25 mg tablets. Take 1/2 tablet po every day. Pt last seen 03/14/20 for HTN. Please advise. Thank ou

## 2020-06-14 NOTE — Telephone Encounter (Signed)
May have 90-day with refill 

## 2020-06-14 NOTE — Addendum Note (Signed)
Addended by: Marlowe Shores on: 06/14/2020 11:15 AM   Modules accepted: Orders

## 2020-07-05 DIAGNOSIS — G4733 Obstructive sleep apnea (adult) (pediatric): Secondary | ICD-10-CM | POA: Diagnosis not present

## 2020-09-04 ENCOUNTER — Other Ambulatory Visit: Payer: Self-pay | Admitting: Family Medicine

## 2020-09-10 ENCOUNTER — Other Ambulatory Visit: Payer: Self-pay | Admitting: Family Medicine

## 2020-09-25 ENCOUNTER — Other Ambulatory Visit: Payer: Self-pay | Admitting: Family Medicine

## 2020-09-26 ENCOUNTER — Telehealth: Payer: Self-pay

## 2020-09-26 ENCOUNTER — Other Ambulatory Visit: Payer: Self-pay | Admitting: Family Medicine

## 2020-09-26 NOTE — Telephone Encounter (Signed)
Patient has appointment for 11/19/2020

## 2020-09-26 NOTE — Telephone Encounter (Signed)
Last labs 04/02/20 lipid, liver, bmp, tsh

## 2020-09-26 NOTE — Telephone Encounter (Signed)
Lipid, liver, metabolic 7, PSA 

## 2020-09-26 NOTE — Telephone Encounter (Signed)
Patient is needing labs for physical in january

## 2020-09-27 ENCOUNTER — Encounter: Payer: Self-pay | Admitting: *Deleted

## 2020-09-27 ENCOUNTER — Other Ambulatory Visit: Payer: Self-pay | Admitting: *Deleted

## 2020-09-27 DIAGNOSIS — Z79899 Other long term (current) drug therapy: Secondary | ICD-10-CM

## 2020-09-27 DIAGNOSIS — I10 Essential (primary) hypertension: Secondary | ICD-10-CM

## 2020-09-27 DIAGNOSIS — Z125 Encounter for screening for malignant neoplasm of prostate: Secondary | ICD-10-CM

## 2020-09-27 DIAGNOSIS — E785 Hyperlipidemia, unspecified: Secondary | ICD-10-CM

## 2020-09-27 NOTE — Telephone Encounter (Signed)
Labs ordered, sent message on mychart.

## 2020-10-05 DIAGNOSIS — G4733 Obstructive sleep apnea (adult) (pediatric): Secondary | ICD-10-CM | POA: Diagnosis not present

## 2020-11-02 DIAGNOSIS — Z79899 Other long term (current) drug therapy: Secondary | ICD-10-CM | POA: Diagnosis not present

## 2020-11-02 DIAGNOSIS — Z125 Encounter for screening for malignant neoplasm of prostate: Secondary | ICD-10-CM | POA: Diagnosis not present

## 2020-11-02 DIAGNOSIS — E785 Hyperlipidemia, unspecified: Secondary | ICD-10-CM | POA: Diagnosis not present

## 2020-11-02 DIAGNOSIS — I10 Essential (primary) hypertension: Secondary | ICD-10-CM | POA: Diagnosis not present

## 2020-11-03 LAB — HEPATIC FUNCTION PANEL
ALT: 18 IU/L (ref 0–44)
AST: 18 IU/L (ref 0–40)
Albumin: 4.6 g/dL (ref 3.8–4.8)
Alkaline Phosphatase: 58 IU/L (ref 44–121)
Bilirubin Total: 2.2 mg/dL — ABNORMAL HIGH (ref 0.0–1.2)
Bilirubin, Direct: 0.43 mg/dL — ABNORMAL HIGH (ref 0.00–0.40)
Total Protein: 6.9 g/dL (ref 6.0–8.5)

## 2020-11-03 LAB — BASIC METABOLIC PANEL
BUN/Creatinine Ratio: 16 (ref 10–24)
BUN: 16 mg/dL (ref 8–27)
CO2: 24 mmol/L (ref 20–29)
Calcium: 9.3 mg/dL (ref 8.6–10.2)
Chloride: 102 mmol/L (ref 96–106)
Creatinine, Ser: 1 mg/dL (ref 0.76–1.27)
GFR calc Af Amer: 88 mL/min/{1.73_m2} (ref >59)
GFR calc non Af Amer: 76 mL/min/{1.73_m2} (ref >59)
Glucose: 116 mg/dL — ABNORMAL HIGH (ref 65–99)
Potassium: 4.2 mmol/L (ref 3.5–5.2)
Sodium: 138 mmol/L (ref 134–144)

## 2020-11-03 LAB — LIPID PANEL
Chol/HDL Ratio: 3.4 ratio (ref 0.0–5.0)
Cholesterol, Total: 140 mg/dL (ref 100–199)
HDL: 41 mg/dL (ref >39)
LDL Chol Calc (NIH): 61 mg/dL (ref 0–99)
Triglycerides: 238 mg/dL — ABNORMAL HIGH (ref 0–149)
VLDL Cholesterol Cal: 38 mg/dL (ref 5–40)

## 2020-11-03 LAB — PSA: Prostate Specific Ag, Serum: 2.8 ng/mL (ref 0.0–4.0)

## 2020-11-12 ENCOUNTER — Other Ambulatory Visit: Payer: Self-pay | Admitting: Family Medicine

## 2020-11-16 ENCOUNTER — Other Ambulatory Visit: Payer: Self-pay | Admitting: Family Medicine

## 2020-11-19 ENCOUNTER — Encounter: Payer: Self-pay | Admitting: Family Medicine

## 2020-11-19 ENCOUNTER — Ambulatory Visit (INDEPENDENT_AMBULATORY_CARE_PROVIDER_SITE_OTHER): Payer: Medicare PPO | Admitting: Family Medicine

## 2020-11-19 ENCOUNTER — Other Ambulatory Visit: Payer: Self-pay | Admitting: Family Medicine

## 2020-11-19 VITALS — BP 138/78 | HR 87 | Temp 97.7°F | Ht 70.0 in | Wt 202.8 lb

## 2020-11-19 DIAGNOSIS — L989 Disorder of the skin and subcutaneous tissue, unspecified: Secondary | ICD-10-CM | POA: Diagnosis not present

## 2020-11-19 DIAGNOSIS — Z0000 Encounter for general adult medical examination without abnormal findings: Secondary | ICD-10-CM | POA: Diagnosis not present

## 2020-11-19 DIAGNOSIS — E039 Hypothyroidism, unspecified: Secondary | ICD-10-CM | POA: Diagnosis not present

## 2020-11-19 DIAGNOSIS — E785 Hyperlipidemia, unspecified: Secondary | ICD-10-CM | POA: Diagnosis not present

## 2020-11-19 DIAGNOSIS — I10 Essential (primary) hypertension: Secondary | ICD-10-CM

## 2020-11-19 DIAGNOSIS — R17 Unspecified jaundice: Secondary | ICD-10-CM

## 2020-11-19 DIAGNOSIS — Z23 Encounter for immunization: Secondary | ICD-10-CM | POA: Diagnosis not present

## 2020-11-19 MED ORDER — OMEPRAZOLE 20 MG PO CPDR: 20.0000 mg | DELAYED_RELEASE_CAPSULE | Freq: Every day | ORAL | 1 refills | Status: DC

## 2020-11-19 MED ORDER — LEVOTHYROXINE SODIUM 100 MCG PO TABS: 100.0000 ug | ORAL_TABLET | Freq: Every day | ORAL | 1 refills | Status: DC

## 2020-11-19 MED ORDER — TAMSULOSIN HCL 0.4 MG PO CAPS: 0.4000 mg | ORAL_CAPSULE | Freq: Every day | ORAL | 1 refills | Status: DC

## 2020-11-19 MED ORDER — ATORVASTATIN CALCIUM 80 MG PO TABS: 80.0000 mg | ORAL_TABLET | Freq: Every day | ORAL | 1 refills | Status: DC

## 2020-11-19 MED ORDER — LOSARTAN POTASSIUM 50 MG PO TABS
ORAL_TABLET | ORAL | 1 refills | Status: DC
Start: 2020-11-19 — End: 2020-12-21

## 2020-11-19 MED ORDER — METOPROLOL SUCCINATE ER 25 MG PO TB24: ORAL_TABLET | ORAL | 1 refills | Status: DC

## 2020-11-19 MED ORDER — ALPRAZOLAM 0.25 MG PO TABS: 0.2500 mg | ORAL_TABLET | Freq: Every evening | ORAL | 5 refills | Status: DC | PRN

## 2020-11-19 NOTE — Progress Notes (Signed)
Can we please have this information sent to [facility].

## 2020-11-19 NOTE — Patient Instructions (Addendum)
  This is some additional information regarding Covid vaccine.  The Covid vaccine-specifically Pfizer and Moderna-help program the body to help recognize Covid to lessen the chance of getting sick with Covid plus also dramatically lessen the risk of a severe outcome.  These vaccines require 2 shots spaced several weeks apart.  The vaccine utilizes mRNA technology and does not interact with our DNA.  The research for these vaccines started back in the 1990s with mRNA research and technology.  The use of mRNA technology for vaccines was actually developed back during SARS infection of [place] in 2005.  Because this virus did not go worldwide there was never a need to utilize a vaccine.  The vaccine simply instructs our immune system how to recognize Covid virus thus lessening our risk of severe illness.  The studies show that vaccines are generally very well-tolerated.  It is common to have some mild side effects such as soreness at the site of the injection, for some individuals low-grade fever body aches headaches nausea diarrhea not feeling good for 2 to 3 days after the injection.  These moderate side effects are more likely to occur with the second shot.  Within 2 weeks of completing the second shot antibody levels are at a good level. Study showed that since June 2020 90% of those hospitalized for Covid were unvaccinated.  96% of those who died of Covid were unvaccinated.  The risk of dying from Covid for those who are vaccinated and under age 50 approaches 0%.  Most people who get sick with Covid will gradually get better but there is a randomness to the illness in which even younger individuals can have a severe case and end up in the hospital with Covid.  It is true that the older we are, Covid poses more risk.  Unfortunately some of the individuals who have severe cases or die from Covid have very little underlying health issues and are not considered "elderly ".1 in 7 individuals who have  Covid continue to have some symptoms of long-term Covid.  Sometimes this can be altered smell.  There are some this can result with long-term fatigue tiredness or headaches.  Also it has been shown.  Scientific studies that Covid can trigger heart attacks and strokes.  Having a Covid vaccine will not protect one against all Covid infections.  Approximately 20 to 30% of individuals who have had vaccines may get a amount of breakthrough case.  The way I look upon this-most of us drive cars that have seatbelts and in some cases airbags.  Every day we drive, we take a risk of potentially being in the traffic accident.  Having a seatbelt and airbag does not mean that we will not get in a traffic accident.  But having a seatbelt and the airbag can dramatically lower our risk of a severe outcome that could put us in the hospital or cause us to die from a auto accident.  In the same way having the Covid vaccine will lessen our risk of getting sick with Covid but if we are unlucky enough to get Covid the chance of being hospitalized or dying from it is tremendously lower.  So just as I would not feel good about disengaging my seatbelt and disabling my airbag-I would not feel good about living in a pandemic and not having the vaccine.  It is not unusual for viruses to mutate and therefore it is not unusual to have to sometimes redesign vaccines or get   booster in order to maintain effectiveness of the vaccine.  I have had several individuals who have stated that they are not around others and so therefore they do not need to get the vaccine.  The difficult part is-this virus can infect some without triggering symptoms-therefore we can get exposed by being around a person who looks healthy-yet they are spreading the virus.  Every person is at risk of getting Covid.  I certainly recognize that many patients have difficulty deciding whether or not to do the vaccine.  Unfortunately there is a lot of information being  pushed forward on the news media, social media such as Facebook, and discussion of all friends and family that can be confusing for the average person to figure out.  It is important to realize that this information that we are exposed to falls into 3 categories. Category #1-on target information-this is typically information that is shown by scientific studies, or stated forth by experts within their fields. Category #2-misinformation-this is information that is well intended but is not an accurate reflection of what is really the case. Category #3-dis-information-this is information that is purposefully off base.  It is designed to confuse and discourage an individual from making a proper decision.  Through the past year and a half I have heard many different reasons why people do not get Covid vaccines.  Some of these reasons are a reflection of a person's deeply held beliefs.  Other reasons are based upon misinformation propagated in social media.  When making this decision it is extremely important to weed through all the noise!   Through my years of training, it has helped me to look at public health issues and treatment options from a medical perspective.  It is my firm belief that this vaccine is safe (I personally favor Moderna but Pfizer vaccine is also good).  Please do not underestimate this virus.  It is my sincere wish that all of our patients stay healthy and do not suffer tragic outcomes from this virus.  Please consider getting the vaccine if you have not already done so.  If you should have further questions please let us know.  Thanks-Dr. Lorin Picket  Results for orders placed or performed in visit on 09/27/20  Lipid panel  Result Value Ref Range   Cholesterol, Total 140 100 - 199 mg/dL   Triglycerides 003 (H) 0 - 149 mg/dL   HDL 41 >49 mg/dL   VLDL Cholesterol Cal 38 5 - 40 mg/dL   LDL Chol Calc (NIH) 61 0 - 99 mg/dL   Chol/HDL Ratio 3.4 0.0 - 5.0 ratio  Hepatic function panel   Result Value Ref Range   Total Protein 6.9 6.0 - 8.5 g/dL   Albumin 4.6 3.8 - 4.8 g/dL   Bilirubin Total 2.2 (H) 0.0 - 1.2 mg/dL   Bilirubin, Direct 1.79 (H) 0.00 - 0.40 mg/dL   Alkaline Phosphatase 58 44 - 121 IU/L   AST 18 0 - 40 IU/L   ALT 18 0 - 44 IU/L  Basic metabolic panel  Result Value Ref Range   Glucose 116 (H) 65 - 99 mg/dL   BUN 16 8 - 27 mg/dL   Creatinine, Ser 1.50 0.76 - 1.27 mg/dL   GFR calc non Af Amer 76 >59 mL/min/1.73   GFR calc Af Amer 88 >59 mL/min/1.73   BUN/Creatinine Ratio 16 10 - 24   Sodium 138 134 - 144 mmol/L   Potassium 4.2 3.5 - 5.2 mmol/L   Chloride  102 96 - 106 mmol/L   CO2 24 20 - 29 mmol/L   Calcium 9.3 8.6 - 10.2 mg/dL  PSA  Result Value Ref Range   Prostate Specific Ag, Serum 2.8 0.0 - 4.0 ng/mL

## 2020-11-19 NOTE — Progress Notes (Signed)
Subjective:    Patient ID: Mark [name], male    DOB: [birth year], 70 y.o.   MRN: [id number]  HPI AWV- Annual Wellness Visit  The patient was seen for their annual wellness visit. The patient's past medical history, surgical history, and family history were reviewed. Pertinent vaccines were reviewed ( tetanus, pneumonia, shingles, flu) The patient's medication list was reviewed and updated.  The height and weight were entered.  BMI recorded in electronic record elsewhere  Cognitive screening was completed. Outcome of Mini - Cog: pass   Falls /depression screening electronically recorded within record elsewhere  Current tobacco usage:none (All patients who use tobacco were given written and verbal information on quitting)  Recent listing of emergency department/hospitalizations over the past year were reviewed.  current specialist the patient sees on a regular basis: no   Medicare annual wellness visit patient questionnaire was reviewed.  A written screening schedule for the patient for the next 5-10 years was given. Appropriate discussion of followup regarding next visit was discussed.  Pt having concerns about blister that come about on head-going on about 3 months.   Results for orders placed or performed in visit on 09/27/20  Lipid panel  Result Value Ref Range   Cholesterol, Total 140 100 - 199 mg/dL   Triglycerides 829 (H) 0 - 149 mg/dL   HDL 41 >93 mg/dL   VLDL Cholesterol Cal 38 5 - 40 mg/dL   LDL Chol Calc (NIH) 61 0 - 99 mg/dL   Chol/HDL Ratio 3.4 0.0 - 5.0 ratio  Hepatic function panel  Result Value Ref Range   Total Protein 6.9 6.0 - 8.5 g/dL   Albumin 4.6 3.8 - 4.8 g/dL   Bilirubin Total 2.2 (H) 0.0 - 1.2 mg/dL   Bilirubin, Direct 7.16 (H) 0.00 - 0.40 mg/dL   Alkaline Phosphatase 58 44 - 121 IU/L   AST 18 0 - 40 IU/L   ALT 18 0 - 44 IU/L  Basic metabolic panel  Result Value Ref Range   Glucose 116 (H) 65 - 99 mg/dL   BUN 16 8 - 27 mg/dL    Creatinine, Ser 9.67 0.76 - 1.27 mg/dL   GFR calc non Af Amer 76 >59 mL/min/1.73   GFR calc Af Amer 88 >59 mL/min/1.73   BUN/Creatinine Ratio 16 10 - 24   Sodium 138 134 - 144 mmol/L   Potassium 4.2 3.5 - 5.2 mmol/L   Chloride 102 96 - 106 mmol/L   CO2 24 20 - 29 mmol/L   Calcium 9.3 8.6 - 10.2 mg/dL  PSA  Result Value Ref Range   Prostate Specific Ag, Serum 2.8 0.0 - 4.0 ng/mL    Review of Systems  Constitutional: Negative for activity change, fatigue and fever.  HENT: Negative for congestion and rhinorrhea.   Respiratory: Negative for cough and shortness of breath.   Cardiovascular: Negative for chest pain and leg swelling.  Gastrointestinal: Negative for abdominal pain, diarrhea and nausea.  Genitourinary: Negative for dysuria and hematuria.  Neurological: Negative for weakness and headaches.  Psychiatric/Behavioral: Negative for agitation and behavioral problems.       Objective:   Physical Exam Vitals reviewed.  Constitutional:      General: He is not in acute distress.    Appearance: He is well-nourished.  HENT:     Head: Normocephalic and atraumatic.  Eyes:     General:        Right eye: No discharge.        Left  eye: No discharge.  Neck:     Trachea: No tracheal deviation.  Cardiovascular:     Rate and Rhythm: Normal rate and regular rhythm.     Heart sounds: Normal heart sounds. No murmur heard.   Pulmonary:     Effort: Pulmonary effort is normal. No respiratory distress.     Breath sounds: Normal breath sounds.  Musculoskeletal:        General: No edema.  Lymphadenopathy:     Cervical: No cervical adenopathy.  Skin:    General: Skin is warm and dry.  Neurological:     Mental Status: He is alert.     Coordination: Coordination normal.  Psychiatric:        Mood and Affect: Mood and affect normal.        Behavior: Behavior normal.   Prostate exam normal Patient has a skin lesion on the back of his head I recommended dermatology referral he already  has a specialist he states he will set himself up History of blood pressure takes his medicine regular basis blood pressure good History thyroid takes his medicine regular basis lab work in the summer look good continue current measures Reflux tried Pepcid did not help back on omeprazole which seems to help well Has history of heart disease takes 81 mg aspirin daily History hyperlipidemia takes his medicine regular basis watches diet        Assessment & Plan:  1. Need for vaccination Vaccines updated today I did have a good conversation with the patient regarding Covid vaccine he has very strong opinions that he does not want to take the Covid vaccine and will not be doing so - Pneumococcal polysaccharide vaccine 23-valent greater than or equal to 2yo subcutaneous/IM - Flu Vaccine QUAD High Dose(Fluad)  2. Elevated bilirubin Slight rise bilirubin over the past year recommend ultrasound of liver to look at common bile duct - US Abdomen Limited RUQ (LIVER/GB)  3. Encounter for subsequent annual wellness visit (AWV) in Medicare patient Adult wellness-complete.wellness physical was conducted today. Importance of diet and exercise were discussed in detail.  In addition to this a discussion regarding safety was also covered. We also reviewed over immunizations and gave recommendations regarding current immunization needed for age.  In addition to this additional areas were also touched on including: Preventative health exams needed:  Colonoscopy colonoscopy will be due spring 2022  Patient was advised yearly wellness exam   4. Skin lesion Patient will set himself up with dermatology  5. Hyperlipidemia, unspecified hyperlipidemia type Continue cholesterol medicine  6. Hypothyroidism, unspecified type Continue thyroid medicine check labs later this year  7. Essential hypertension, benign Blood pressure good continue current medicine follow-up 6 months

## 2020-11-20 ENCOUNTER — Telehealth: Payer: Self-pay | Admitting: Family Medicine

## 2020-11-28 ENCOUNTER — Other Ambulatory Visit: Payer: Self-pay

## 2020-11-28 ENCOUNTER — Ambulatory Visit (HOSPITAL_COMMUNITY)
Admission: RE | Admit: 2020-11-28 | Discharge: 2020-11-28 | Disposition: A | Discharge status: Home / Self Care | Payer: Medicare PPO | Source: Ambulatory Visit | Attending: Family Medicine | Admitting: Family Medicine

## 2020-11-28 DIAGNOSIS — R17 Unspecified jaundice: Secondary | ICD-10-CM | POA: Insufficient documentation

## 2020-11-28 DIAGNOSIS — K8020 Calculus of gallbladder without cholecystitis without obstruction: Secondary | ICD-10-CM | POA: Diagnosis not present

## 2020-11-29 NOTE — Addendum Note (Signed)
Addended by: Margaretha Sheffield on: 11/29/2020 04:05 PM   Modules accepted: Orders

## 2020-12-06 ENCOUNTER — Other Ambulatory Visit: Payer: Self-pay | Admitting: Family Medicine

## 2020-12-17 ENCOUNTER — Other Ambulatory Visit: Payer: Self-pay | Admitting: Family Medicine

## 2020-12-20 ENCOUNTER — Other Ambulatory Visit: Payer: Self-pay | Admitting: Family Medicine

## 2020-12-21 ENCOUNTER — Telehealth: Payer: Self-pay

## 2020-12-21 ENCOUNTER — Other Ambulatory Visit: Payer: Self-pay | Admitting: *Deleted

## 2020-12-21 MED ORDER — LOSARTAN POTASSIUM 50 MG PO TABS: ORAL_TABLET | ORAL | 1 refills | Status: DC

## 2020-12-21 NOTE — Telephone Encounter (Signed)
Pt needs refill on losartan (COZAAR) 50 MG tablet  Pt requested Monday at the  Pharmacy [id number] - [place], [place] - [address] AT [address] [place] he was out as of yesterday   Pt call back [phone]

## 2020-12-21 NOTE — Telephone Encounter (Signed)
Refill sent to pharm per protocol. Pt notified.  

## 2021-01-08 DIAGNOSIS — G4733 Obstructive sleep apnea (adult) (pediatric): Secondary | ICD-10-CM | POA: Diagnosis not present

## 2021-01-31 ENCOUNTER — Telehealth: Payer: Self-pay

## 2021-01-31 DIAGNOSIS — E785 Hyperlipidemia, unspecified: Secondary | ICD-10-CM

## 2021-01-31 DIAGNOSIS — E039 Hypothyroidism, unspecified: Secondary | ICD-10-CM

## 2021-01-31 DIAGNOSIS — I10 Essential (primary) hypertension: Secondary | ICD-10-CM

## 2021-01-31 DIAGNOSIS — Z79899 Other long term (current) drug therapy: Secondary | ICD-10-CM

## 2021-01-31 DIAGNOSIS — R739 Hyperglycemia, unspecified: Secondary | ICD-10-CM

## 2021-01-31 NOTE — Telephone Encounter (Signed)
Lipid, CMP, TSH, free T4, A1c Fasting hyperglycemia, hypothyroidism, hyperlipidemia, hypertension

## 2021-01-31 NOTE — Telephone Encounter (Signed)
Blood work ordered in Epic. Patient notified. 

## 2021-01-31 NOTE — Telephone Encounter (Signed)
Pt needs blood work ordered for follow up on 04/06  Pt call back [phone]

## 2021-02-11 DIAGNOSIS — I10 Essential (primary) hypertension: Secondary | ICD-10-CM | POA: Diagnosis not present

## 2021-02-11 DIAGNOSIS — E785 Hyperlipidemia, unspecified: Secondary | ICD-10-CM | POA: Diagnosis not present

## 2021-02-11 DIAGNOSIS — E039 Hypothyroidism, unspecified: Secondary | ICD-10-CM | POA: Diagnosis not present

## 2021-02-11 DIAGNOSIS — R739 Hyperglycemia, unspecified: Secondary | ICD-10-CM | POA: Diagnosis not present

## 2021-02-12 LAB — COMPREHENSIVE METABOLIC PANEL
ALT: 21 IU/L (ref 0–44)
AST: 22 IU/L (ref 0–40)
Albumin/Globulin Ratio: 1.9 (ref 1.2–2.2)
Albumin: 4.3 g/dL (ref 3.8–4.8)
Alkaline Phosphatase: 60 IU/L (ref 44–121)
BUN/Creatinine Ratio: 18 (ref 10–24)
BUN: 18 mg/dL (ref 8–27)
Bilirubin Total: 1.6 mg/dL — ABNORMAL HIGH (ref 0.0–1.2)
CO2: 23 mmol/L (ref 20–29)
Calcium: 9.3 mg/dL (ref 8.6–10.2)
Chloride: 102 mmol/L (ref 96–106)
Creatinine, Ser: 0.99 mg/dL (ref 0.76–1.27)
Globulin, Total: 2.3 g/dL (ref 1.5–4.5)
Glucose: 114 mg/dL — ABNORMAL HIGH (ref 65–99)
Potassium: 4.5 mmol/L (ref 3.5–5.2)
Sodium: 139 mmol/L (ref 134–144)
Total Protein: 6.6 g/dL (ref 6.0–8.5)
eGFR: 82 mL/min/{1.73_m2} (ref >59)

## 2021-02-12 LAB — HEMOGLOBIN A1C
Est. average glucose Bld gHb Est-mCnc: 114 mg/dL
Hgb A1c MFr Bld: 5.6 % (ref 4.8–5.6)

## 2021-02-12 LAB — LIPID PANEL
Chol/HDL Ratio: 3.2 ratio (ref 0.0–5.0)
Cholesterol, Total: 123 mg/dL (ref 100–199)
HDL: 39 mg/dL — ABNORMAL LOW (ref >39)
LDL Chol Calc (NIH): 59 mg/dL (ref 0–99)
Triglycerides: 144 mg/dL (ref 0–149)
VLDL Cholesterol Cal: 25 mg/dL (ref 5–40)

## 2021-02-12 LAB — TSH: TSH: 2.12 u[IU]/mL (ref 0.450–4.500)

## 2021-02-12 LAB — T4, FREE: Free T4: 1.65 ng/dL (ref 0.82–1.77)

## 2021-02-20 ENCOUNTER — Ambulatory Visit: Payer: Medicare PPO | Admitting: Family Medicine

## 2021-02-20 ENCOUNTER — Other Ambulatory Visit: Payer: Self-pay

## 2021-02-20 ENCOUNTER — Encounter: Payer: Self-pay | Admitting: Family Medicine

## 2021-02-20 VITALS — BP 120/72 | Temp 97.2°F | Wt 204.0 lb

## 2021-02-20 DIAGNOSIS — K760 Fatty (change of) liver, not elsewhere classified: Secondary | ICD-10-CM | POA: Insufficient documentation

## 2021-02-20 DIAGNOSIS — Z1211 Encounter for screening for malignant neoplasm of colon: Secondary | ICD-10-CM | POA: Diagnosis not present

## 2021-02-20 DIAGNOSIS — I25119 Atherosclerotic heart disease of native coronary artery with unspecified angina pectoris: Secondary | ICD-10-CM

## 2021-02-20 DIAGNOSIS — G4700 Insomnia, unspecified: Secondary | ICD-10-CM | POA: Diagnosis not present

## 2021-02-20 DIAGNOSIS — I10 Essential (primary) hypertension: Secondary | ICD-10-CM | POA: Diagnosis not present

## 2021-02-20 DIAGNOSIS — K8020 Calculus of gallbladder without cholecystitis without obstruction: Secondary | ICD-10-CM | POA: Insufficient documentation

## 2021-02-20 DIAGNOSIS — E039 Hypothyroidism, unspecified: Secondary | ICD-10-CM | POA: Diagnosis not present

## 2021-02-20 DIAGNOSIS — R17 Unspecified jaundice: Secondary | ICD-10-CM | POA: Diagnosis not present

## 2021-02-20 NOTE — Progress Notes (Signed)
Subjective:    Patient ID: Mark [name], male    DOB: 1950-12-09, 70 y.o.   MRN: [id number]  HPI Pt here to follow up on elevated liver enzymes. Pt had follow up blood work done to recheck levels. Blood pressure has been doing well.  Patient had elevated bilirubin.  Liver enzymes had look good.  He did ultrasound which showed fatty liver Also showed a gallstone He denies any abdominal pain discomfort nausea or vomiting Tries to eat healthy stay active Takes his medications as directed Results for orders placed or performed in visit on 01/31/21  Lipid panel  Result Value Ref Range   Cholesterol, Total 123 100 - 199 mg/dL   Triglycerides 144 0 - 149 mg/dL   HDL 39 (L) >39 mg/dL   VLDL Cholesterol Cal 25 5 - 40 mg/dL   LDL Chol Calc (NIH) 59 0 - 99 mg/dL   Chol/HDL Ratio 3.2 0.0 - 5.0 ratio  Comprehensive metabolic panel  Result Value Ref Range   Glucose 114 (H) 65 - 99 mg/dL   BUN 18 8 - 27 mg/dL   Creatinine, Ser 0.99 0.76 - 1.27 mg/dL   eGFR 82 >59 mL/min/1.73   BUN/Creatinine Ratio 18 10 - 24   Sodium 139 134 - 144 mmol/L   Potassium 4.5 3.5 - 5.2 mmol/L   Chloride 102 96 - 106 mmol/L   CO2 23 20 - 29 mmol/L   Calcium 9.3 8.6 - 10.2 mg/dL   Total Protein 6.6 6.0 - 8.5 g/dL   Albumin 4.3 3.8 - 4.8 g/dL   Globulin, Total 2.3 1.5 - 4.5 g/dL   Albumin/Globulin Ratio 1.9 1.2 - 2.2   Bilirubin Total 1.6 (H) 0.0 - 1.2 mg/dL   Alkaline Phosphatase 60 44 - 121 IU/L   AST 22 0 - 40 IU/L   ALT 21 0 - 44 IU/L  T4, free  Result Value Ref Range   Free T4 1.65 0.82 - 1.77 ng/dL  TSH  Result Value Ref Range   TSH 2.120 0.450 - 4.500 uIU/mL  Hemoglobin A1c  Result Value Ref Range   Hgb A1c MFr Bld 5.6 4.8 - 5.6 %   Est. average glucose Bld gHb Est-mCnc 114 mg/dL     Review of Systems Please see above    Objective:   Physical Exam Vitals reviewed.  Cardiovascular:     Rate and Rhythm: Normal rate and regular rhythm.     Heart sounds: Normal heart sounds. No murmur  heard.   Pulmonary:     Effort: Pulmonary effort is normal.     Breath sounds: Normal breath sounds.  Lymphadenopathy:     Cervical: No cervical adenopathy.  Neurological:     Mental Status: He is alert.  Psychiatric:        Behavior: Behavior normal.           Assessment & Plan:  1. Encounter for screening colonoscopy Referral for colonoscopy prefers previous provider - Ambulatory referral to Gastroenterology  2. Calculus of gallbladder without cholecystitis without obstruction Gallstone asymptomatic warning signs what to watch for regarding gallbladder attack discussed no need for surgery Blood pressure good control continue current measures 3. Elevated bilirubin Bilirubin is improved ultrasound does show fatty liver Result of ultrasound and as well as recent lab work results discussed 4. Essential hypertension, benign Blood pressure good control continue current measures Creatinine discussed normal 5. Hypothyroidism, unspecified type Patient was seen today regarding hypothyroidism.  Importance of healthy diet, regular physical  activity was discussed.  Importance of compliance with medication and regular checks regarding this was discussed.  Thyroid good control discussed Cholesterol profile discussed in detail continue statin 6. Insomnia, unspecified type .bsin Takes Xanax at nighttime denies abusing it  7. Atherosclerosis of native coronary artery of native heart with angina pectoris ([place]) Tries to keep risk factors under control with aggressive measures

## 2021-03-12 ENCOUNTER — Other Ambulatory Visit: Payer: Self-pay | Admitting: Family Medicine

## 2021-05-09 DIAGNOSIS — G4733 Obstructive sleep apnea (adult) (pediatric): Secondary | ICD-10-CM | POA: Diagnosis not present

## 2021-05-22 DIAGNOSIS — G4733 Obstructive sleep apnea (adult) (pediatric): Secondary | ICD-10-CM | POA: Diagnosis not present

## 2021-05-29 ENCOUNTER — Other Ambulatory Visit: Payer: Self-pay | Admitting: Family Medicine

## 2021-06-07 ENCOUNTER — Other Ambulatory Visit: Payer: Self-pay | Admitting: Family Medicine

## 2021-06-11 ENCOUNTER — Other Ambulatory Visit: Payer: Self-pay | Admitting: Family Medicine

## 2021-06-25 ENCOUNTER — Other Ambulatory Visit: Payer: Self-pay | Admitting: Family Medicine

## 2021-06-28 ENCOUNTER — Other Ambulatory Visit: Payer: Self-pay | Admitting: Family Medicine

## 2021-07-08 ENCOUNTER — Telehealth: Payer: Self-pay | Admitting: Family Medicine

## 2021-07-08 NOTE — Telephone Encounter (Signed)
[facility]; was on [facility] Contacted pt and advised him that script did go through. Pt verbalized understanding

## 2021-07-08 NOTE — Telephone Encounter (Signed)
Pt calling and stating that pharmacy is requesting change to acid reflux med. Pt states he has been using omeprazole 20 mg and it is working well and doesn't want to change. States something about the insurance is wanting to change it. Informed pt we do not have anything regarding change in computer or at nurses station. Informed pt we would call [facility] to see what needed to be done.

## 2021-07-16 DIAGNOSIS — Z1211 Encounter for screening for malignant neoplasm of colon: Secondary | ICD-10-CM | POA: Diagnosis not present

## 2021-07-16 DIAGNOSIS — R7989 Other specified abnormal findings of blood chemistry: Secondary | ICD-10-CM | POA: Diagnosis not present

## 2021-07-30 DIAGNOSIS — Z1211 Encounter for screening for malignant neoplasm of colon: Secondary | ICD-10-CM | POA: Diagnosis not present

## 2021-07-30 DIAGNOSIS — K635 Polyp of colon: Secondary | ICD-10-CM | POA: Diagnosis not present

## 2021-07-30 LAB — HM COLONOSCOPY

## 2021-08-05 DIAGNOSIS — K635 Polyp of colon: Secondary | ICD-10-CM | POA: Diagnosis not present

## 2021-08-14 DIAGNOSIS — G4733 Obstructive sleep apnea (adult) (pediatric): Secondary | ICD-10-CM | POA: Diagnosis not present

## 2021-09-05 DIAGNOSIS — Z1211 Encounter for screening for malignant neoplasm of colon: Secondary | ICD-10-CM | POA: Diagnosis not present

## 2021-09-05 DIAGNOSIS — K648 Other hemorrhoids: Secondary | ICD-10-CM | POA: Diagnosis not present

## 2021-09-07 ENCOUNTER — Other Ambulatory Visit: Payer: Self-pay | Admitting: Family Medicine

## 2021-09-08 ENCOUNTER — Other Ambulatory Visit: Payer: Self-pay | Admitting: Family Medicine

## 2021-09-25 ENCOUNTER — Other Ambulatory Visit: Payer: Self-pay | Admitting: Family Medicine

## 2021-10-01 NOTE — Progress Notes (Signed)
Cardiology Office Note:    Date:  10/02/2021   ID:  Mark [name], DOB 02/03/1951, MRN [id number]  PCP:  Babs Sciara, MD  Cardiologist:  Orbie Pyo, MD  Electrophysiologist:  None   Referring MD: Babs Sciara, MD   Chief Complaint/Reason for Referral: Ches pain  History of Present Illness:    PROBLEM LIST: 1.  Coronary artery disease status post CABG consisting of a LIMA to LAD; most recent cardiac catheterization 2019 demonstrated widely patent LIMA, and FFR negative left main and obtuse marginal disease 2.  Hypertension 3.  Hyperlipidemia 4.  Obstructive sleep apnea on CPAP  Mark [name] is a 70 y.o. male with the indicated history for chest pain.  Patient had been seen previously in this division in 2019.  He had underwent a cardiac catheterization due to an abnormal stress test.  This demonstrated stable and moderate disease.  He was seen by his primary care provider and was doing well.  He had an episode of exertional chest pain with neck fullness last week.  He sat down and it went away.  This then recurred yesterday.  He was working in his shop when he developed the symptoms.  He rested and then went away.  This recurred again.  This is not recurred since yesterday.  Up until this started he was walking 2 miles without any issues.  He denies any significant shortness of breath, presyncope, syncope, palpitations, paroxysmal nocturnal dyspnea, orthopnea, or severe bruising or bleeding.  He has required no emergency room visits or hospitalizations recently.  Past Medical History:  Diagnosis Date   Abnormal nuclear stress test false positive 05/2018   BPH (benign prostatic hyperplasia) 04/21/2019   Chest pain stable CAD with cath 06/07/18 possible GI 06/03/2018   Coronary artery disease    a. s/p CABG 2005. b. cath 05/2018 - patent LIMA-LAD, moderate LM and prox OM disease with neg FFR.   Dyslipidemia    Esophageal stricture    ESOPHAGEAL STRICTURE 12/18/2008    Qualifier: History of  By: Candice Camp CMA (AAMA), Dottie     ESOPHAGITIS 12/18/2008   Qualifier: History of  By: Candice Camp CMA (AAMA), Dottie     Essential hypertension, benign 04/06/2013   Gastrointestinal bleeding    GERD (gastroesophageal reflux disease)    History of colonic polyps 08/26/2014   [facility] [place] during colonoscopy in May of 2015. They recommend repeat colonoscopy May of 2017 do to inadequate prep, pathology showed hyperplastic polyp    Hyperlipidemia    Hypothyroidism    Insomnia 04/21/2019   Obstructive sleep apnea 07/11/2013   Has mixed apnea, CPAP setting 7    OSA (obstructive sleep apnea)    RECTAL BLEEDING 12/18/2008   Qualifier: Diagnosis of  By: Candice Camp CMA (AAMA), Dottie     Urinary retention    Vitiligo 07/11/2013    Past Surgical History:  Procedure Laterality Date   APPENDECTOMY     bilateral inguinal hernia repair     CARDIAC CATHETERIZATION  12/2005, 06/2005   COLONOSCOPY     CORONARY ARTERY BYPASS GRAFT  2006   LIMA to LAD, off pump   INTRAVASCULAR PRESSURE WIRE/FFR STUDY N/A 06/07/2018   Procedure: INTRAVASCULAR PRESSURE WIRE/FFR STUDY;  Surgeon: Marykay Lex, MD;  Location: [facility];  Service: Cardiovascular;  Laterality: N/A;   LEFT HEART CATH AND CORS/GRAFTS ANGIOGRAPHY N/A 06/07/2018   Procedure: LEFT HEART CATH AND CORS/GRAFTS ANGIOGRAPHY;  Surgeon: Bryan Lemma  W, MD;  Location: [facility];  Service: Cardiovascular;  Laterality: N/A;   OPEN REDUCTION INTERNAL FIXATION (ORIF) HAND Left 07/21/2015   Procedure: IRRIGATION AND DEBRIDMENT LEFT INDEX AND MIDDLE FINGER, OPEN REDUCTION INTERNAL FIXATION LEFT INDEX FINGER.;  Surgeon: Dominica Severin, MD;  Location: [facility] OR;  Service: Orthopedics;  Laterality: Left;    Current Medications: Current Meds  Medication Sig   albuterol (VENTOLIN HFA) 108 (90 Base) MCG/ACT inhaler TAKE 2 PUFFS BY MOUTH EVERY 6 HOURS AS NEEDED FOR WHEEZE OR SHORTNESS OF  BREATH   ALPRAZolam (XANAX) 0.25 MG tablet TAKE 1 TABLET (0.25 MG TOTAL) BY MOUTH AT BEDTIME AS NEEDED   aspirin 81 MG tablet Take 81 mg by mouth every morning.   atorvastatin (LIPITOR) 80 MG tablet TAKE 1 TABLET BY MOUTH EVERY DAY   cetirizine (ZYRTEC ALLERGY) 10 MG tablet Take 5 mg by mouth daily as needed for allergies.   isosorbide mononitrate (IMDUR) 30 MG 24 hr tablet Take 1 tablet (30 mg total) by mouth daily.   levothyroxine (SYNTHROID) 100 MCG tablet TAKE 1 TABLET BY MOUTH EVERY DAY   losartan (COZAAR) 50 MG tablet TAKE 1 TABLET BY MOUTH EVERY DAY   metoprolol succinate (TOPROL-XL) 25 MG 24 hr tablet TAKE 1/2 TABLET BY MOUTH EVERY DAY   naproxen (NAPROSYN) 500 MG tablet Take 500 mg by mouth 2 (two) times daily with a meal.   nitroGLYCERIN (NITROSTAT) 0.4 MG SL tablet Place 1 tablet (0.4 mg total) under the tongue every 5 (five) minutes as needed for chest pain.   omeprazole (PRILOSEC) 20 MG capsule TAKE 1 CAPSULE BY MOUTH EVERY DAY   tamsulosin (FLOMAX) 0.4 MG CAPS capsule TAKE 1 CAPSULE BY MOUTH EVERY DAY   triamcinolone cream (KENALOG) 0.1 % Apply 1 application topically 2 (two) times daily.     Allergies:    Allergies  Allergen Reactions   Meloxicam Rash    Social History   Tobacco Use   Smoking status: Former    Types: Cigarettes    Quit date: 07/31/1979    Years since quitting: 42.2   Smokeless tobacco: Former  Substance Use Topics   Alcohol use: Yes    Comment: occassional   Drug use: No     Family History: Family History  Problem Relation Age of Onset   Heart disease Father    Hypertension Father      ROS:   Please see the history of present illness.    All other systems reviewed and are negative.  EKGs/Labs/Other Studies Reviewed:    The following studies were reviewed today:  Cor angio 2019 Severe 1 V CAD - 100% CTO of LAD that fills via LIMA Moderate (FFR Negative) Prox LM & Prox OM1.  TTE 2019 - Left ventricle: The cavity size was normal.  Wall thickness was    increased in a pattern of mild LVH. Systolic function was normal.    The estimated ejection fraction was in the range of 60% to 65%.    Wall motion was normal; there were no regional wall motion    abnormalities. Left ventricular diastolic function parameters    were normal.  - Aortic valve: Mildly calcified annulus. Trileaflet; normal    thickness leaflets. Valve area (VTI): 3.64 cm^2. Valve area    (Vmax): 3.36 cm^2.   EKG: EKG demonstrates sinus rhythm with nonspecific ST and T wave changes.  Imaging studies that I have independently reviewed today: Cardiac catheterization as detailed above from 2019  Recent  Labs: 02/11/2021: ALT 21; BUN 18; Creatinine, Ser 0.99; Potassium 4.5; Sodium 139; TSH 2.120   Recent Lipid Panel    Component Value Date/Time   CHOL 123 02/11/2021 0803   TRIG 144 02/11/2021 0803   HDL 39 (L) 02/11/2021 0803   CHOLHDL 3.2 02/11/2021 0803   CHOLHDL 3.3 10/31/2014 0919   VLDL 40 10/31/2014 0919   LDLCALC 59 02/11/2021 0803    Physical Exam:    VS:  BP 116/72   Pulse 70   Ht 5\' 11"  (1.803 m)   Wt 210 lb (95.3 kg)   SpO2 97%   BMI 29.29 kg/m     Wt Readings from Last 5 Encounters:  10/02/21 210 lb (95.3 kg)  02/20/21 204 lb (92.5 kg)  11/19/20 202 lb 12.8 oz (92 kg)  03/14/20 207 lb 3.2 oz (94 kg)  09/15/19 206 lb (93.4 kg)    GENERAL:  No apparent distress, AOx3 HEENT:  No carotid bruits, +2 carotid impulses, no scleral icterus CAR: RRR no murmurs, gallops, rubs, or thrills RES:  Clear to auscultation bilaterally ABD:  Soft, nontender, nondistended, positive bowel sounds x 4 VASC:  +2 radial pulses, +2 carotid pulses, palpable pedal pulses NEURO:  CN 2-12 grossly intact; motor and sensory grossly intact PSYCH:  No active depression or anxiety EXT:  No edema, ecchymosis, or cyanosis  ASSESSMENT:    1. Atherosclerosis of native coronary artery of native heart with stable angina pectoris (HCC)   2. Hyperlipidemia,  unspecified hyperlipidemia type   3. 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  Will prescribe as needed nitroglycerin.  I discussed possible cardiac catheterization.  Patient is unsure whether his symptoms represent GERD or not and would like to see how the medications work.  We have come to consensus decision to start his medications and he will call us next week or we will contact him to assess his symptoms.  If they are not controlled on these medications we will proceed to coronary angiography.  The patient understands and agrees with this plan.  Otherwise I will see him back in 3 months or earlier if needed.  Hyperlipidemia, unspecified hyperlipidemia type - Plan: EKG 12-Lead His lipid panel earlier this year was at goal.  His LDL goal is less than 70.  This is being followed by his primary care provider.  Hypertension, unspecified type - Plan: EKG 12-Lead His blood pressure is well controlled on his current regimen.    Shared Decision Making/Informed Consent:       Medication Adjustments/Labs and Tests Ordered: Current medicines are reviewed at length with the patient today.  Concerns regarding medicines are outlined above.   Orders Placed This Encounter  Procedures   EKG 12-Lead     Meds ordered this encounter  Medications   isosorbide mononitrate (IMDUR) 30 MG 24 hr tablet    Sig: Take 1 tablet (30 mg total) by mouth daily.    Dispense:  90 tablet    Refill:  3   nitroGLYCERIN (NITROSTAT) 0.4 MG SL tablet    Sig: Place 1 tablet (0.4 mg total) under the tongue every 5 (five) minutes as needed for chest pain.    Dispense:  25 tablet    Refill:  3     Patient Instructions  Medication Instructions:  Your physician has recommended you  make the following change in your medication:   START Imdur 30 mg  taking 1 tablet at bedtime  START Nitroglycerin 0.4 s/l tablets.. USE ONLY AS NEEDED FOR CHEST PAIN... The proper use and anticipated side effects of nitroglycerine has been carefully explained.  If a single episode of chest pain is not relieved by one tablet, the patient will try another within 5 minutes; and if this doesn't relieve the pain, the patient is instructed to call 911 for transportation to an emergency department.   *If you need a refill on your cardiac medications before your next appointment, please call your pharmacy*   Lab Work: None ordered  If you have labs (blood work) drawn today and your tests are completely normal, you will receive your results only by: MyChart Message (if you have MyChart) OR A paper copy in the mail If you have any lab test that is abnormal or we need to change your treatment, we will call you to review the results.   Testing/Procedures: None ordered   Follow-Up: At [facility], you and your health needs are our priority.  As part of our continuing mission to provide you with exceptional heart care, we have created designated Provider Care Teams.  These Care Teams include your primary Cardiologist (physician) and Advanced Practice Providers (APPs -  Physician Assistants and Nurse Practitioners) who all work together to provide you with the care you need, when you need it.  We recommend signing up for the patient portal called "MyChart".  Sign up information is provided on this After Visit Summary.  MyChart is used to connect with patients for Virtual Visits (Telemedicine).  Patients are able to view lab/test results, encounter notes, upcoming appointments, etc.  Non-urgent messages can be sent to your provider as well.   To learn more about what you can do with MyChart, go to [email or web address].    Your next appointment:   3 month(s)  The format for your next appointment:   In Person  Provider:   Orbie Pyo, MD     Other  Instructions CALL us IN 1 WEEK, [phone], AND LET us KNOW HOW YOU ARE FEELING

## 2021-10-02 ENCOUNTER — Other Ambulatory Visit: Payer: Self-pay

## 2021-10-02 ENCOUNTER — Ambulatory Visit: Payer: Medicare PPO | Admitting: Internal Medicine

## 2021-10-02 ENCOUNTER — Encounter: Payer: Self-pay | Admitting: Internal Medicine

## 2021-10-02 VITALS — BP 116/72 | HR 70 | Ht 71.0 in | Wt 210.0 lb

## 2021-10-02 DIAGNOSIS — I25118 Atherosclerotic heart disease of native coronary artery with other forms of angina pectoris: Secondary | ICD-10-CM | POA: Diagnosis not present

## 2021-10-02 DIAGNOSIS — I10 Essential (primary) hypertension: Secondary | ICD-10-CM

## 2021-10-02 DIAGNOSIS — I2510 Atherosclerotic heart disease of native coronary artery without angina pectoris: Secondary | ICD-10-CM

## 2021-10-02 DIAGNOSIS — E785 Hyperlipidemia, unspecified: Secondary | ICD-10-CM

## 2021-10-02 MED ORDER — ISOSORBIDE MONONITRATE ER 30 MG PO TB24: 30.0000 mg | ORAL_TABLET | Freq: Every day | ORAL | 3 refills | Status: DC

## 2021-10-02 MED ORDER — NITROGLYCERIN 0.4 MG SL SUBL: 0.4000 mg | SUBLINGUAL_TABLET | SUBLINGUAL | 3 refills | Status: DC | PRN

## 2021-10-02 NOTE — Patient Instructions (Addendum)
Medication Instructions:  Your physician has recommended you make the following change in your medication:   START Imdur 30 mg taking 1 tablet at bedtime  START Nitroglycerin 0.4 s/l tablets.. USE ONLY AS NEEDED FOR CHEST PAIN... The proper use and anticipated side effects of nitroglycerine has been carefully explained.  If a single episode of chest pain is not relieved by one tablet, the patient will try another within 5 minutes; and if this doesn't relieve the pain, the patient is instructed to call 911 for transportation to an emergency department.   *If you need a refill on your cardiac medications before your next appointment, please call your pharmacy*   Lab Work: None ordered  If you have labs (blood work) drawn today and your tests are completely normal, you will receive your results only by: MyChart Message (if you have MyChart) OR A paper copy in the mail If you have any lab test that is abnormal or we need to change your treatment, we will call you to review the results.   Testing/Procedures: None ordered   Follow-Up: At [facility], you and your health needs are our priority.  As part of our continuing mission to provide you with exceptional heart care, we have created designated Provider Care Teams.  These Care Teams include your primary Cardiologist (physician) and Advanced Practice Providers (APPs -  Physician Assistants and Nurse Practitioners) who all work together to provide you with the care you need, when you need it.  We recommend signing up for the patient portal called "MyChart".  Sign up information is provided on this After Visit Summary.  MyChart is used to connect with patients for Virtual Visits (Telemedicine).  Patients are able to view lab/test results, encounter notes, upcoming appointments, etc.  Non-urgent messages can be sent to your provider as well.   To learn more about what you can do with MyChart, go to [email or web address].    Your next  appointment:   3 month(s)  The format for your next appointment:   In Person  Provider:   Orbie Pyo, MD     Other Instructions CALL us IN 1 WEEK, [phone], AND LET us KNOW HOW YOU ARE FEELING

## 2021-10-03 DIAGNOSIS — R69 Illness, unspecified: Secondary | ICD-10-CM | POA: Diagnosis not present

## 2021-10-07 ENCOUNTER — Telehealth: Payer: Self-pay | Admitting: Internal Medicine

## 2021-10-07 NOTE — Telephone Encounter (Signed)
Pt c/o of Chest Pain: STAT if CP now or developed within 24 hours  1. Are you having CP right now? No   2. Are you experiencing any other symptoms (ex. SOB, nausea, vomiting, sweating)? Pain in throat as well   3. How long have you been experiencing CP? Started this morning around 9:00 AM  4. Is your CP continuous or coming and going? Coming and going   5. Have you taken Nitroglycerin? Yes, took two and pain went away  ?

## 2021-10-07 NOTE — Telephone Encounter (Signed)
Call was transferred from operator.  He developed a second episode of chest pain with neck fullness while on the phone w me.  He is just sitting at his dining room table.  No SOB, no light headedness palpitations.  He ate a sausage and egg biscuit around 8am.   He has taken ntg. The patient has not started the isosorbide that was prescribed at last ov.  He said he didn't think to start it because he had not had any episodes like the one that prompted the cardiology visit.   We reviewed options of start the isosorbide 30 mg - taking first tablet now, then gradually working it toward bedtime dosing, vs coming to the ER for urgent evaluation. He asked about coming and getting a cath.  I adv I could set up a future cath for him but the ER could evaluate him and see if any intervention is needed right now.   His wife could drive him.  He took the isosorbide while on the phone.  His CP had eased off.  I adv that he should report to ER for returning or worsening symptoms, or any CP that is accompanied by any other symptoms. Pt grateful for information provided.    Reviewed recommendation from ov on 10/02/21 from Dr. Lynnette Caffey: Atherosclerosis of native coronary artery of native heart with stable angina pectoris (HCC) - Plan: EKG 12-Lead Continue beta-blocker and start Imdur 30 mg at bedtime.  Will prescribe as needed nitroglycerin.  I discussed possible cardiac catheterization.  Patient is unsure whether his symptoms represent GERD or not and would like to see how the medications work.  We have come to consensus decision to start his medications and he will call us next week or we will contact him to assess his symptoms.  If they are not controlled on these medications we will proceed to coronary angiography.  The patient understands and agrees with this plan.  Otherwise I will see him back in 3 months or earlier if needed.

## 2021-10-10 ENCOUNTER — Other Ambulatory Visit: Payer: Self-pay | Admitting: Family Medicine

## 2021-10-14 NOTE — Telephone Encounter (Signed)
Pt c/o of Chest Pain: STAT if CP now or developed within 24 hours  1. Are you having CP right now? No   2. Are you experiencing any other symptoms (ex. SOB, nausea, vomiting, sweating)? No   3. How long have you been experiencing CP? Episode started yesterday   4. Is your CP continuous or coming and going? Coming and going.   5. Have you taken Nitroglycerin? Yes, 10/13/21 at 7:00 PM  ?    BP 165-170/102 yesterday prior to the CP. Wanting to possibly line up a catheterization. Has not felt right since taking the time release Nitro. States he feels different. Reports he will go to the ED if symptoms reoccur.

## 2021-10-14 NOTE — Telephone Encounter (Signed)
Called pt in regards to CP.  Pt reports CP occurred 10/13/21 around 6:30 pm.  Describes CP as between heart and left shoulder and felt as if something were sitting on chest.  Also reports pain in throat.  Took X 1 NTG CP subsided also took imdur early.  BP during event was 165/102.  Pt reports no further episodes of CP.  BP this AM 130/80.  Pt reports he had a high salt diet for Thanksgiving had bacon and [place] in a season brine.  I advised pt if CP returns to use NTG as ordered if no relief to call 911 for evaluation.  Pt is requesting to have either a stress test or heart cath.  Will route to MD to address.

## 2021-10-14 NOTE — Telephone Encounter (Signed)
From: Orbie Pyo, MD  Sent: 10/14/2021  12:43 PM EST  To: Lendon Ka, RN, Macie Burows, RN    I spoke with patient.  I advised him to keep away from salty foods, take Imdur daily, and PRN NTG as needed.  I asked him to call us later in the week to let us know how things are.  If needed, we could do a cath.  I told him if he needs a NTG and he takes one and needs a second, to go to ER.  Thx

## 2021-10-17 DIAGNOSIS — Q279 Congenital malformation of peripheral vascular system, unspecified: Secondary | ICD-10-CM | POA: Diagnosis not present

## 2021-10-30 ENCOUNTER — Telehealth: Payer: Self-pay | Admitting: Family Medicine

## 2021-10-30 NOTE — Telephone Encounter (Signed)
Patient has physical 11/20/21 and needing labs

## 2021-10-30 NOTE — Telephone Encounter (Signed)
Last labs completed 02/11/21 A1C, TSH, T4, CMP and LIPID. Please advise. Thank you!

## 2021-10-31 ENCOUNTER — Other Ambulatory Visit: Payer: Self-pay

## 2021-10-31 DIAGNOSIS — Z125 Encounter for screening for malignant neoplasm of prostate: Secondary | ICD-10-CM

## 2021-10-31 DIAGNOSIS — K760 Fatty (change of) liver, not elsewhere classified: Secondary | ICD-10-CM

## 2021-10-31 DIAGNOSIS — I10 Essential (primary) hypertension: Secondary | ICD-10-CM

## 2021-10-31 DIAGNOSIS — E039 Hypothyroidism, unspecified: Secondary | ICD-10-CM

## 2021-10-31 DIAGNOSIS — Z1159 Encounter for screening for other viral diseases: Secondary | ICD-10-CM

## 2021-10-31 DIAGNOSIS — E785 Hyperlipidemia, unspecified: Secondary | ICD-10-CM

## 2021-10-31 NOTE — Telephone Encounter (Signed)
TSH, free T4, lipid, liver, metabolic 7, PSA, CBC, hep C antibody per CDC screening guidelines Hypothyroidism hyperlipidemia hypertension, wellness, screening prostate cancer  I do not recommend A1c previous A1c was normal

## 2021-10-31 NOTE — Telephone Encounter (Signed)
Patient made aware.

## 2021-11-12 DIAGNOSIS — Z1159 Encounter for screening for other viral diseases: Secondary | ICD-10-CM | POA: Diagnosis not present

## 2021-11-12 DIAGNOSIS — Z125 Encounter for screening for malignant neoplasm of prostate: Secondary | ICD-10-CM | POA: Diagnosis not present

## 2021-11-12 DIAGNOSIS — K760 Fatty (change of) liver, not elsewhere classified: Secondary | ICD-10-CM | POA: Diagnosis not present

## 2021-11-12 DIAGNOSIS — E785 Hyperlipidemia, unspecified: Secondary | ICD-10-CM | POA: Diagnosis not present

## 2021-11-12 DIAGNOSIS — I10 Essential (primary) hypertension: Secondary | ICD-10-CM | POA: Diagnosis not present

## 2021-11-12 DIAGNOSIS — E039 Hypothyroidism, unspecified: Secondary | ICD-10-CM | POA: Diagnosis not present

## 2021-11-13 LAB — CBC WITH DIFFERENTIAL/PLATELET
Basophils Absolute: 0.1 10*3/uL (ref 0.0–0.2)
Basos: 1 %
EOS (ABSOLUTE): 0.3 10*3/uL (ref 0.0–0.4)
Eos: 5 %
Hematocrit: 43.6 % (ref 37.5–51.0)
Hemoglobin: 14.5 g/dL (ref 13.0–17.7)
Immature Grans (Abs): 0 10*3/uL (ref 0.0–0.1)
Immature Granulocytes: 0 %
Lymphocytes Absolute: 2 10*3/uL (ref 0.7–3.1)
Lymphs: 41 %
MCH: 30.3 pg (ref 26.6–33.0)
MCHC: 33.3 g/dL (ref 31.5–35.7)
MCV: 91 fL (ref 79–97)
Monocytes Absolute: 0.5 10*3/uL (ref 0.1–0.9)
Monocytes: 10 %
Neutrophils Absolute: 2.1 10*3/uL (ref 1.4–7.0)
Neutrophils: 43 %
Platelets: 172 10*3/uL (ref 150–450)
RBC: 4.78 x10E6/uL (ref 4.14–5.80)
RDW: 12.3 % (ref 11.6–15.4)
WBC: 5 10*3/uL (ref 3.4–10.8)

## 2021-11-13 LAB — BASIC METABOLIC PANEL
BUN/Creatinine Ratio: 21 (ref 10–24)
BUN: 21 mg/dL (ref 8–27)
CO2: 22 mmol/L (ref 20–29)
Calcium: 9.1 mg/dL (ref 8.6–10.2)
Chloride: 102 mmol/L (ref 96–106)
Creatinine, Ser: 0.98 mg/dL (ref 0.76–1.27)
Glucose: 117 mg/dL — ABNORMAL HIGH (ref 70–99)
Potassium: 4.3 mmol/L (ref 3.5–5.2)
Sodium: 137 mmol/L (ref 134–144)
eGFR: 82 mL/min/{1.73_m2} (ref >59)

## 2021-11-13 LAB — LIPID PANEL
Chol/HDL Ratio: 3.7 ratio (ref 0.0–5.0)
Cholesterol, Total: 134 mg/dL (ref 100–199)
HDL: 36 mg/dL — ABNORMAL LOW (ref >39)
LDL Chol Calc (NIH): 61 mg/dL (ref 0–99)
Triglycerides: 228 mg/dL — ABNORMAL HIGH (ref 0–149)
VLDL Cholesterol Cal: 37 mg/dL (ref 5–40)

## 2021-11-13 LAB — HEPATITIS C ANTIBODY: Hep C Virus Ab: <0.1 s/co ratio (ref 0.0–0.9)

## 2021-11-13 LAB — HEPATIC FUNCTION PANEL
ALT: 19 IU/L (ref 0–44)
AST: 20 IU/L (ref 0–40)
Albumin: 4.2 g/dL (ref 3.7–4.7)
Alkaline Phosphatase: 64 IU/L (ref 44–121)
Bilirubin Total: 1.8 mg/dL — ABNORMAL HIGH (ref 0.0–1.2)
Bilirubin, Direct: 0.36 mg/dL (ref 0.00–0.40)
Total Protein: 6.6 g/dL (ref 6.0–8.5)

## 2021-11-13 LAB — PSA: Prostate Specific Ag, Serum: 2.8 ng/mL (ref 0.0–4.0)

## 2021-11-13 LAB — TSH+FREE T4
Free T4: 1.51 ng/dL (ref 0.82–1.77)
TSH: 3.75 u[IU]/mL (ref 0.450–4.500)

## 2021-11-20 ENCOUNTER — Encounter: Payer: Medicare PPO | Admitting: Family Medicine

## 2021-12-06 ENCOUNTER — Other Ambulatory Visit: Payer: Self-pay | Admitting: Family Medicine

## 2021-12-07 ENCOUNTER — Other Ambulatory Visit: Payer: Self-pay | Admitting: Family Medicine

## 2021-12-11 ENCOUNTER — Other Ambulatory Visit: Payer: Self-pay

## 2021-12-11 ENCOUNTER — Encounter: Payer: Self-pay | Admitting: Family Medicine

## 2021-12-11 ENCOUNTER — Ambulatory Visit (INDEPENDENT_AMBULATORY_CARE_PROVIDER_SITE_OTHER): Payer: Medicare PPO | Admitting: Family Medicine

## 2021-12-11 VITALS — BP 134/81 | HR 82 | Ht 71.0 in | Wt 208.2 lb

## 2021-12-11 DIAGNOSIS — I25118 Atherosclerotic heart disease of native coronary artery with other forms of angina pectoris: Secondary | ICD-10-CM

## 2021-12-11 DIAGNOSIS — Z0001 Encounter for general adult medical examination with abnormal findings: Secondary | ICD-10-CM | POA: Diagnosis not present

## 2021-12-11 DIAGNOSIS — E785 Hyperlipidemia, unspecified: Secondary | ICD-10-CM

## 2021-12-11 DIAGNOSIS — I10 Essential (primary) hypertension: Secondary | ICD-10-CM | POA: Diagnosis not present

## 2021-12-11 DIAGNOSIS — G4700 Insomnia, unspecified: Secondary | ICD-10-CM | POA: Diagnosis not present

## 2021-12-11 DIAGNOSIS — E039 Hypothyroidism, unspecified: Secondary | ICD-10-CM | POA: Diagnosis not present

## 2021-12-11 DIAGNOSIS — Z0000 Encounter for general adult medical examination without abnormal findings: Secondary | ICD-10-CM

## 2021-12-11 MED ORDER — METOPROLOL SUCCINATE ER 25 MG PO TB24: 12.5000 mg | ORAL_TABLET | Freq: Every day | ORAL | 1 refills | Status: DC

## 2021-12-11 MED ORDER — TAMSULOSIN HCL 0.4 MG PO CAPS: 0.4000 mg | ORAL_CAPSULE | Freq: Every day | ORAL | 1 refills | Status: DC

## 2021-12-11 MED ORDER — LEVOTHYROXINE SODIUM 100 MCG PO TABS: 100.0000 ug | ORAL_TABLET | Freq: Every day | ORAL | 1 refills | Status: DC

## 2021-12-11 MED ORDER — OMEPRAZOLE 20 MG PO CPDR: 20.0000 mg | DELAYED_RELEASE_CAPSULE | Freq: Every day | ORAL | 1 refills | Status: DC

## 2021-12-11 MED ORDER — ATORVASTATIN CALCIUM 80 MG PO TABS: 80.0000 mg | ORAL_TABLET | Freq: Every day | ORAL | 1 refills | Status: DC

## 2021-12-11 MED ORDER — LOSARTAN POTASSIUM 50 MG PO TABS
50.0000 mg | ORAL_TABLET | Freq: Every day | ORAL | 1 refills | Status: DC
Start: 2021-12-11 — End: 2022-08-12

## 2021-12-11 NOTE — Progress Notes (Signed)
° °  Subjective:    Patient ID: Mark [name], male    DOB: [birth year], 71 y.o.   MRN: [id number]  HPI Patient relates his mood is overall doing well denies feeling depressed Tries to eat healthy and stay relatively active Does have underlying issues regarding his heart Recently started on Imdur States he was splitting wood and he felt a slight discomfort in his chest when he cut back on the activity and it got better He does feel he is doing better on the Imdur He does have follow-up with cardiology Patient states urination doing well Cholesterol doing well Uses Xanax late at night to help with sleep we did counsel him regarding avoiding it if he does not feel he needs it  AWV- Annual Wellness Visit  The patient was seen for their annual wellness visit. The patient's past medical history, surgical history, and family history were reviewed. Pertinent vaccines were reviewed ( tetanus, pneumonia, shingles, flu) The patient's medication list was reviewed and updated.  The height and weight were entered.  BMI recorded in electronic record elsewhere  Cognitive screening was completed. Outcome of Mini - Cog: pass   Falls /depression screening electronically recorded within record elsewhere  Current tobacco usage:no (All patients who use tobacco were given written and verbal information on quitting)  Recent listing of emergency department/hospitalizations over the past year were reviewed.  current specialist the patient sees on a regular basis: cardiology   Medicare annual wellness visit patient questionnaire was reviewed.  A written screening schedule for the patient for the next 5-10 years was given. Appropriate discussion of followup regarding next visit was discussed.  Would like to discuss medications and any potential interactions   Review of Systems     Objective:   Physical Exam  General-in no acute distress Eyes-no discharge Lungs-respiratory rate normal,  CTA CV-no murmurs,RRR Extremities skin warm dry no edema Neuro grossly normal Behavior normal, alert Prostate exam not indicated Blood pressure recheck       Assessment & Plan:  1. Well adult exam Adult wellness-complete.wellness physical was conducted today. Importance of diet and exercise were discussed in detail.  In addition to this a discussion regarding safety was also covered. We also reviewed over immunizations and gave recommendations regarding current immunization needed for age.  In addition to this additional areas were also touched on including: Preventative health exams needed:  Colonoscopy patient recently did this in November states it went well he states he was told he will not have to do it again  Patient was advised yearly wellness exam  We will try to get a copy of the colonoscopy to [place] 2. 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 Essential hypertension, benign Blood pressure good control continue current medication  4. Hypothyroidism, unspecified type Lab work look good continue current medication  5. Hyperlipidemia, unspecified hyperlipidemia type Labs look good continue current medication  6. Insomnia, unspecified type Xanax sparingly at nighttime  7. Encounter for subsequent annual wellness visit (AWV) in Medicare patient Discussion with the patient encouraged him to get flu shot vaccine.  Patient defers currently  State exam not indicated Follow-up again in approximately 6 months

## 2021-12-17 NOTE — Progress Notes (Signed)
We will check  Cardiology Office Note:    Date:  12/19/2021   ID:  Mark [name], DOB 04-09-1951, MRN [id number]  PCP:  Mark Drown, MD   [facility] Providers Cardiologist:  Mark Sciara, MD Referring MD: Mark Drown, MD   Chief Complaint/Reason for Referral: Cardiology follow-up  ASSESSMENT:    Atherosclerosis of native coronary artery of native heart with stable angina pectoris ([facility])  Hyperlipidemia, unspecified hyperlipidemia type  Hypertension, unspecified type    PLAN:    In order of problems listed above:  1.  Stable anginal symptoms are improved with Imdur.  I reiterated that he may take as needed nitroglycerin for any breakthrough pain and to contact our office if he needs excessive nitroglycerin or takes it on a daily basis.  Follow up in 6 months or earlier if needed.  2.  Recent lipid panel was at goal.  Lipid panel next year with LFTs.   Dispo:  Return in about 6 months (around 06/18/2022).     Medication Adjustments/Labs and Tests Ordered: Current medicines are reviewed at length with the patient today.  Concerns regarding medicines are outlined above.   Tests Ordered: No orders of the defined types were placed in this encounter.   Medication Changes: No orders of the defined types were placed in this encounter.   History of Present Illness:    FOCUSED CARDIOVASCULAR PROBLEM LIST:   1.  Coronary artery disease status post CABG consisting of a LIMA to LAD; most recent cardiac catheterization 2019 demonstrated widely patent LIMA, and FFR negative left main and obtuse marginal disease 2.  Hypertension 3.  Hyperlipidemia 4.  Obstructive sleep apnea on CPAP  The patient is a 71 y.o. male with the indicated medical history here for follow-up.  When I saw him last there was a question about whether he was having chest pain or GERD.  He was started on medical therapy including Imdur,  nitroglycerin as well as continuing his  beta-blocker.  The patient tells me that his chest pain symptoms are much improved.  He is able to walk a mile or 2 every other day without chest pain symptoms.  He has noticed that his sleep has been a little bit more interrupted.  I suggested that he potentially change that when he takes his Imdur.  He did have an episode of chest pain today that took a few minutes to resolve.  He did not take as needed nitroglycerin.  He denies any shortness of breath, presyncope, syncope, palpitations, paroxysmal nocturnal dyspnea, orthopnea.  He has required no emergency room visits or hospitalizations.        Previous Medical History: Past Medical History:  Diagnosis Date   Abnormal nuclear stress test false positive 05/2018   BPH (benign prostatic hyperplasia) 04/21/2019   Chest pain stable CAD with cath 06/07/18 possible GI 06/03/2018   Coronary artery disease    a. s/p CABG 2005. b. cath 05/2018 - patent LIMA-LAD, moderate LM and prox OM disease with neg FFR.   Dyslipidemia    Esophageal stricture    ESOPHAGEAL STRICTURE 12/18/2008   Qualifier: History of  By: Mark [name] CMA (AAMA), Dottie     ESOPHAGITIS 12/18/2008   Qualifier: History of  By: Mark [name] CMA (AAMA), Dottie     Essential hypertension, benign 04/06/2013   Gastrointestinal bleeding    GERD (gastroesophageal reflux disease)    History of colonic polyps 08/26/2014   [facility] during colonoscopy in  May of 2015. They recommend repeat colonoscopy May of 2017 do to inadequate prep, pathology showed hyperplastic polyp    Hyperlipidemia    Hypothyroidism    Insomnia 04/21/2019   Obstructive sleep apnea 07/11/2013   Has mixed apnea, CPAP setting 7    OSA (obstructive sleep apnea)    RECTAL BLEEDING 12/18/2008   Qualifier: Diagnosis of  By: Mark [name] CMA (AAMA), Dottie     Urinary retention    Vitiligo 07/11/2013     Current Medications: Current Meds  Medication Sig   albuterol (VENTOLIN HFA) 108  (90 Base) MCG/ACT inhaler TAKE 2 PUFFS BY MOUTH EVERY 6 HOURS AS NEEDED FOR WHEEZE OR SHORTNESS OF BREATH   ALPRAZolam (XANAX) 0.25 MG tablet TAKE 1 TABLET (0.25 MG TOTAL) BY MOUTH AT BEDTIME AS NEEDED   aspirin 81 MG tablet Take 81 mg by mouth every morning.   atorvastatin (LIPITOR) 80 MG tablet Take 1 tablet (80 mg total) by mouth daily.   cetirizine (ZYRTEC) 10 MG tablet Take 5 mg by mouth daily as needed for allergies.   isosorbide mononitrate (IMDUR) 30 MG 24 hr tablet Take 1 tablet (30 mg total) by mouth daily.   levothyroxine (SYNTHROID) 100 MCG tablet Take 1 tablet (100 mcg total) by mouth daily.   losartan (COZAAR) 50 MG tablet Take 1 tablet (50 mg total) by mouth daily.   metoprolol succinate (TOPROL-XL) 25 MG 24 hr tablet Take 0.5 tablets (12.5 mg total) by mouth daily.   naproxen (NAPROSYN) 500 MG tablet Take 500 mg by mouth 2 (two) times daily with a meal.   nitroGLYCERIN (NITROSTAT) 0.4 MG SL tablet Place 1 tablet (0.4 mg total) under the tongue every 5 (five) minutes as needed for chest pain.   omeprazole (PRILOSEC) 20 MG capsule Take 1 capsule (20 mg total) by mouth daily.   Saw Palmetto, Serenoa repens, (SAW PALMETTO PO) Take by mouth.   tamsulosin (FLOMAX) 0.4 MG CAPS capsule Take 1 capsule (0.4 mg total) by mouth daily.   triamcinolone cream (KENALOG) 0.1 % Apply 1 application topically 2 (two) times daily.     Allergies:    Meloxicam   Social History:   Social History   Tobacco Use   Smoking status: Former    Types: Cigarettes    Quit date: 07/31/1979    Years since quitting: 42.4   Smokeless tobacco: Former  Substance Use Topics   Alcohol use: Yes    Comment: occassional   Drug use: No     Family Hx: Family History  Problem Relation Age of Onset   Heart disease Father    Hypertension Father      Review of Systems:   Please see the history of present illness.    All other systems reviewed and are negative.     EKGs/Labs/Other Test Reviewed:    Cor  angio 2019 Severe 1 V CAD - 100% CTO of LAD that fills via LIMA Moderate (FFR Negative) Prox LM & Prox OM1.  TTE 2019 - Left ventricle: The cavity size was normal. Wall thickness was    increased in a pattern of mild LVH. Systolic function was normal.    The estimated ejection fraction was in the range of 60% to 65%.    Wall motion was normal; there were no regional wall motion    abnormalities. Left ventricular diastolic function parameters    were normal.  - Aortic valve: Mildly calcified annulus. Trileaflet; normal    thickness leaflets. Valve area (VTI): 3.64  cm^2. Valve area    (Vmax): 3.36 cm^2.   EKG: EKG demonstrates sinus rhythm with nonspecific ST and T wave changes.  Imaging studies that I have independently reviewed today: Coronary angiography and echocardiogram  Recent Labs: 11/12/2021: ALT 19; BUN 21; Creatinine, Ser 0.98; Hemoglobin 14.5; Platelets 172; Potassium 4.3; Sodium 137; TSH 3.750   Recent Lipid Panel Lab Results  Component Value Date/Time   CHOL 134 11/12/2021 08:20 AM   TRIG 228 (H) 11/12/2021 08:20 AM   HDL 36 (L) 11/12/2021 08:20 AM   LDLCALC 61 11/12/2021 08:20 AM    Risk Assessment/Calculations:          Physical Exam:    VS:  BP 126/78    Pulse 68    Ht 5\' 11"  (1.803 m)    Wt 209 lb (94.8 kg)    SpO2 98%    BMI 29.15 kg/m    Wt Readings from Last 3 Encounters:  12/19/21 209 lb (94.8 kg)  12/11/21 208 lb 3.2 oz (94.4 kg)  10/02/21 210 lb (95.3 kg)    GENERAL:  No apparent distress, AOx3 HEENT:  No carotid bruits, +2 carotid impulses, no scleral icterus CAR: RRR no murmurs, gallops, rubs, or thrills RES:  Clear to auscultation bilaterally ABD:  Soft, nontender, nondistended, positive bowel sounds x 4 VASC:  +2 radial pulses, +2 carotid pulses, palpable pedal pulses NEURO:  CN 2-12 grossly intact; motor and sensory grossly intact PSYCH:  No active depression or anxiety EXT:  No edema, ecchymosis, or cyanosis  Signed, Early Osmond,  MD  12/19/2021 10:44 AM    [address], [place], [place]  [address] Phone: [phone]; Fax: [phone]   Note:  This document was prepared using Dragon voice recognition software and may include unintentional dictation errors.

## 2021-12-19 ENCOUNTER — Other Ambulatory Visit: Payer: Self-pay

## 2021-12-19 ENCOUNTER — Ambulatory Visit: Payer: Medicare PPO | Admitting: Internal Medicine

## 2021-12-19 ENCOUNTER — Encounter: Payer: Self-pay | Admitting: Internal Medicine

## 2021-12-19 VITALS — BP 126/78 | HR 68 | Ht 71.0 in | Wt 209.0 lb

## 2021-12-19 DIAGNOSIS — E785 Hyperlipidemia, unspecified: Secondary | ICD-10-CM

## 2021-12-19 DIAGNOSIS — I10 Essential (primary) hypertension: Secondary | ICD-10-CM

## 2021-12-19 DIAGNOSIS — I25118 Atherosclerotic heart disease of native coronary artery with other forms of angina pectoris: Secondary | ICD-10-CM

## 2021-12-19 NOTE — Patient Instructions (Signed)
Medication Instructions:  °NO CHANGES °*If you need a refill on your cardiac medications before your next appointment, please call your pharmacy* ° ° °Lab Work: °NONE °If you have labs (blood work) drawn today and your tests are completely normal, you will receive your results only by: °MyChart Message (if you have MyChart) OR °A paper copy in the mail °If you have any lab test that is abnormal or we need to change your treatment, we will call you to review the results. ° ° °Testing/Procedures: °NONE ° ° °Follow-Up: °At [facility], you and your health needs are our priority.  As part of our continuing mission to provide you with exceptional heart care, we have created designated Provider Care Teams.  These Care Teams include your primary Cardiologist (physician) and Advanced Practice Providers (APPs -  Physician Assistants and Nurse Practitioners) who all work together to provide you with the care you need, when you need it. ° °We recommend signing up for the patient portal called "MyChart".  Sign up information is provided on this After Visit Summary.  MyChart is used to connect with patients for Virtual Visits (Telemedicine).  Patients are able to view lab/test results, encounter notes, upcoming appointments, etc.  Non-urgent messages can be sent to your provider as well.   °To learn more about what you can do with MyChart, go to [email or web address].   ° °Your next appointment:   °6 month(s) ° °The format for your next appointment:   °In Person ° °Provider:   °DR THUKKANI  ° ° °Other Instructions °NONE  °

## 2021-12-20 ENCOUNTER — Other Ambulatory Visit: Payer: Self-pay | Admitting: Family Medicine

## 2022-03-17 ENCOUNTER — Ambulatory Visit: Payer: Medicare PPO | Admitting: Family Medicine

## 2022-03-18 ENCOUNTER — Ambulatory Visit: Payer: Medicare PPO | Admitting: Family Medicine

## 2022-03-18 VITALS — BP 134/86 | HR 61 | Temp 98.4°F | Wt 204.0 lb

## 2022-03-18 DIAGNOSIS — G542 Cervical root disorders, not elsewhere classified: Secondary | ICD-10-CM | POA: Diagnosis not present

## 2022-03-18 MED ORDER — PREDNISONE 20 MG PO TABS: ORAL_TABLET | ORAL | 0 refills | Status: DC

## 2022-03-18 NOTE — Progress Notes (Signed)
? ?  Subjective:  ? ? Patient ID: Mark [name], male    DOB: Dec 15, 1950, 71 y.o.   MRN: [id number] ? ?HPI ?Right shoulder pain x 3 to 4 weeks now started tingling in R arm and hands mostly in thumb and index finger ?Has noticed this over the past few weeks no weakness but does relate waking up at night with shoulder pain arm pain.  Also having hand numbness.  Numbness is mainly in the index finger middle finger and thumb.  This points toward the possibility of cervical nerve impingement.  Has not had previous surgery of the neck.  Denies any other symptoms no stroke symptoms currently ? ? ? ? ? ?Review of Systems ? ?   ?Objective:  ? Physical Exam ?No tenderness in the trapezius shoulder or arm has good range of motion reflexes good subjective tingling into the thumb index finger and middle finger ?No weakness noted. ?Shoulder has good range of motion no sign of rotator cuff tear ?Lungs clear heart regular ?This indicates cervical impingement ? ?   ?Assessment & Plan:  ?Cervical impingement ?Recommend x-ray ?Short course prednisone ?More than likely will need MRI and referral to a specialist ?We will go ahead with progressing forward ? ? ?

## 2022-04-04 DIAGNOSIS — H02825 Cysts of left lower eyelid: Secondary | ICD-10-CM | POA: Diagnosis not present

## 2022-04-04 DIAGNOSIS — H4321 Crystalline deposits in vitreous body, right eye: Secondary | ICD-10-CM | POA: Diagnosis not present

## 2022-04-04 DIAGNOSIS — H2513 Age-related nuclear cataract, bilateral: Secondary | ICD-10-CM | POA: Diagnosis not present

## 2022-04-04 DIAGNOSIS — H524 Presbyopia: Secondary | ICD-10-CM | POA: Diagnosis not present

## 2022-04-06 ENCOUNTER — Encounter: Payer: Self-pay | Admitting: Family Medicine

## 2022-04-06 DIAGNOSIS — G542 Cervical root disorders, not elsewhere classified: Secondary | ICD-10-CM

## 2022-04-07 NOTE — Telephone Encounter (Signed)
Nurses I would recommend cervical spine x-rays first Due to cervical impingement  Also is patient still having numbness with the discomfort?  Is the discomfort getting worse?  Any weakness?

## 2022-04-08 ENCOUNTER — Ambulatory Visit (HOSPITAL_COMMUNITY)
Admission: RE | Admit: 2022-04-08 | Discharge: 2022-04-08 | Disposition: A | Discharge status: Home / Self Care | Payer: Medicare PPO | Source: Ambulatory Visit | Attending: Family Medicine | Admitting: Family Medicine

## 2022-04-08 DIAGNOSIS — R202 Paresthesia of skin: Secondary | ICD-10-CM | POA: Diagnosis not present

## 2022-04-08 DIAGNOSIS — M542 Cervicalgia: Secondary | ICD-10-CM | POA: Diagnosis not present

## 2022-04-08 DIAGNOSIS — G542 Cervical root disorders, not elsewhere classified: Secondary | ICD-10-CM | POA: Insufficient documentation

## 2022-04-08 DIAGNOSIS — R200 Anesthesia of skin: Secondary | ICD-10-CM | POA: Diagnosis not present

## 2022-04-11 ENCOUNTER — Other Ambulatory Visit: Payer: Self-pay | Admitting: Family Medicine

## 2022-04-11 ENCOUNTER — Telehealth: Payer: Self-pay | Admitting: Family Medicine

## 2022-04-11 DIAGNOSIS — G542 Cervical root disorders, not elsewhere classified: Secondary | ICD-10-CM

## 2022-04-11 NOTE — Telephone Encounter (Signed)
Pt was informed of xray results earlier and was to check with insurance regarding paying for PT. Pt states he called insurance and it would be a $20 copay with each visit. Pt has decided to go ahead with PT. PT referral placed.

## 2022-04-17 DIAGNOSIS — D2272 Melanocytic nevi of left lower limb, including hip: Secondary | ICD-10-CM | POA: Diagnosis not present

## 2022-04-17 DIAGNOSIS — Z85828 Personal history of other malignant neoplasm of skin: Secondary | ICD-10-CM | POA: Diagnosis not present

## 2022-04-17 DIAGNOSIS — L814 Other melanin hyperpigmentation: Secondary | ICD-10-CM | POA: Diagnosis not present

## 2022-04-17 DIAGNOSIS — L821 Other seborrheic keratosis: Secondary | ICD-10-CM | POA: Diagnosis not present

## 2022-04-17 DIAGNOSIS — L281 Prurigo nodularis: Secondary | ICD-10-CM | POA: Diagnosis not present

## 2022-04-17 DIAGNOSIS — L738 Other specified follicular disorders: Secondary | ICD-10-CM | POA: Diagnosis not present

## 2022-04-17 DIAGNOSIS — L80 Vitiligo: Secondary | ICD-10-CM | POA: Diagnosis not present

## 2022-04-17 DIAGNOSIS — D1801 Hemangioma of skin and subcutaneous tissue: Secondary | ICD-10-CM | POA: Diagnosis not present

## 2022-05-01 ENCOUNTER — Ambulatory Visit (HOSPITAL_COMMUNITY): Payer: Medicare PPO | Attending: Family Medicine | Admitting: Physical Therapy

## 2022-05-01 ENCOUNTER — Encounter (HOSPITAL_COMMUNITY): Payer: Self-pay | Admitting: Physical Therapy

## 2022-05-01 DIAGNOSIS — G542 Cervical root disorders, not elsewhere classified: Secondary | ICD-10-CM | POA: Diagnosis not present

## 2022-05-01 DIAGNOSIS — M5412 Radiculopathy, cervical region: Secondary | ICD-10-CM

## 2022-05-01 NOTE — Therapy (Signed)
OUTPATIENT PHYSICAL THERAPY CERVICAL EVALUATION   Patient Name: Mark [name] MRN: [id number] DOB:06/26/1951, 71 y.o., male Today's Date: 05/01/2022   PT End of Session - 05/01/22 1004     Visit Number 1    Number of Visits 12    Date for PT Re-Evaluation 06/12/22    Authorization Type [employer] requestg submitted    Progress Note Due on Visit 10    PT Start Time 1005    PT Stop Time 1045    PT Time Calculation (min) 40 min             Past Medical History:  Diagnosis Date   Abnormal nuclear stress test false positive 05/2018   BPH (benign prostatic hyperplasia) 04/21/2019   Chest pain stable CAD with cath 06/07/18 possible GI 06/03/2018   Coronary artery disease    a. s/p CABG 2005. b. cath 05/2018 - patent LIMA-LAD, moderate LM and prox OM disease with neg FFR.   Dyslipidemia    Esophageal stricture    ESOPHAGEAL STRICTURE 12/18/2008   Qualifier: History of  By: Candice Camp CMA (AAMA), Dottie     ESOPHAGITIS 12/18/2008   Qualifier: History of  By: Candice Camp CMA (AAMA), Dottie     Essential hypertension, benign 04/06/2013   Gastrointestinal bleeding    GERD (gastroesophageal reflux disease)    History of colonic polyps 08/26/2014   [facility] [place] during colonoscopy in May of 2015. They recommend repeat colonoscopy May of 2017 do to inadequate prep, pathology showed hyperplastic polyp    Hyperlipidemia    Hypothyroidism    Insomnia 04/21/2019   Obstructive sleep apnea 07/11/2013   Has mixed apnea, CPAP setting 7    OSA (obstructive sleep apnea)    RECTAL BLEEDING 12/18/2008   Qualifier: Diagnosis of  By: Candice Camp CMA (AAMA), Dottie     Urinary retention    Vitiligo 07/11/2013   Past Surgical History:  Procedure Laterality Date   APPENDECTOMY     bilateral inguinal hernia repair     CARDIAC CATHETERIZATION  12/2005, 06/2005   COLONOSCOPY     CORONARY ARTERY BYPASS GRAFT  2006   LIMA to LAD, off pump   INTRAVASCULAR PRESSURE  WIRE/FFR STUDY N/A 06/07/2018   Procedure: INTRAVASCULAR PRESSURE WIRE/FFR STUDY;  Surgeon: Marykay Lex, MD;  Location: [facility] INVASIVE CV LAB;  Service: Cardiovascular;  Laterality: N/A;   LEFT HEART CATH AND CORS/GRAFTS ANGIOGRAPHY N/A 06/07/2018   Procedure: LEFT HEART CATH AND CORS/GRAFTS ANGIOGRAPHY;  Surgeon: Marykay Lex, MD;  Location: [facility] INVASIVE CV LAB;  Service: Cardiovascular;  Laterality: N/A;   OPEN REDUCTION INTERNAL FIXATION (ORIF) HAND Left 07/21/2015   Procedure: IRRIGATION AND DEBRIDMENT LEFT INDEX AND MIDDLE FINGER, OPEN REDUCTION INTERNAL FIXATION LEFT INDEX FINGER.;  Surgeon: Dominica Severin, MD;  Location: [facility] OR;  Service: Orthopedics;  Laterality: Left;   Patient Active Problem List   Diagnosis Date Noted   Calculus of gallbladder without cholecystitis without obstruction 02/20/2021   Fatty liver 02/20/2021   Insomnia 04/21/2019   BPH (benign prostatic hyperplasia) 04/21/2019   Angina pectoris ([facility])    Abnormal nuclear stress test false positive    Chest pain stable CAD with cath 06/07/18 possible GI 06/03/2018   History of colonic polyps 08/26/2014   Obstructive sleep apnea 07/11/2013   Vitiligo 07/11/2013   Essential hypertension, benign 04/06/2013   CORONARY ATHEROSCLEROSIS NATIVE CORONARY ARTERY 11/20/2009   Hypothyroidism 12/18/2008   Hyperlipidemia 12/18/2008   ESOPHAGITIS 12/18/2008   ESOPHAGEAL STRICTURE  12/18/2008   RECTAL BLEEDING 12/18/2008  PCP: Babs Sciara, MD  REFERRING PROVIDER: Babs Sciara, MD  REFERRING DIAG:   G54.2 (ICD-10-CM) - Cervical nerve root impingement   THERAPY DIAG:  Cervical radiculopathy  Rationale for Evaluation and Treatment Rehabilitation  ONSET DATE: acute exacerbation 01/29/2022  SUBJECTIVE:                                                                                                                                                                                                         SUBJECTIVE  STATEMENT: Pt states that his neck has been bothering for years but his Rt shoulder started hurting several months ago and now he has pain and tingling into his Rt hand on almost a constant basis.  The tingling is the worst at night.    PERTINENT HISTORY:  ORIF RT UE; cardiac cath, Rt shoulder surgery to repair a ligament 12 years ago.   PAIN:  Are you having pain? Yes: NPRS scale: 6/10, greatest pain in the past week 8 ;least 2 Pain location: posterior cervical area Pain description: constant pain  Aggravating factors: using vibrating tools  Relieving factors: recliner with a pillow under his neck   PRECAUTIONS: None  WEIGHT BEARING RESTRICTIONS No  FALLS:  Has patient fallen in last 6 months? No  LIVING ENVIRONMENT: Lives with: lives with their family Lives in: House/apartment OCCUPATION: retired   PLOF: Independent  PATIENT GOALS less pain and radicular sx   OBJECTIVE:   DIAGNOSTIC FINDINGS:  IMPRESSION: Multilevel degenerative disc and endplate changes with neuroforaminal narrowing greatest at the bilateral C3-4 and C4-5 levels.    PATIENT SURVEYS:  FOTO 64   COGNITION: Overall cognitive status: Within functional limits for tasks assessed   SENSATION: Not tested  POSTURE: rounded shoulders   PALPATION: Moderate mm spasm in upper trap.   CERVICAL ROM:   Active ROM A/PROM (deg) eval  Flexion   Extension Wnl  pt head slightly SB to rt:  reps increases tingling.    Right lateral flexion Wnl reps makes no change.   Left lateral flexion Wnl: reps increases pain down arm   Right rotation   Left rotation    (Blank rows = not tested) Rt cervical SB isometric:  3+/5 LT cervical SB isometric: 3+/5  Extension:  3+/5  UPPER EXTREMITY MMT:  MMT Right eval Left eval  Shoulder flexion 4/5 5/5  Shoulder extension    Shoulder abduction 4+/5 5/5  Shoulder adduction    Shoulder extension    Shoulder internal rotation 4/5 4+/5  Shoulder  external rotation  3/5 3+/5  Middle trapezius    Lower trapezius    Elbow flexion 4+/5 4+/5  Elbow extension 4+/5 5/5  Wrist flexion 5/5 5/5  Wrist extension 5/5 5/5  Wrist ulnar deviation    Wrist radial deviation    Wrist pronation    Wrist supination    Grip strength 52# each rep had decreased grip strength.  57# each rep had decreased grip strength   (Blank rows = not tested)   PATIENT SURVEYS:  FOTO 64  TODAY'S TREATMENT:  6/15:  evaluation:  HEP,posture education    PATIENT EDUCATION:  Education details: HEP,posture  Person educated: Patient Education method: Explanation Education comprehension: returned demonstration   HOME EXERCISE PROGRAM: [id number] Sitting:  Rt cervical SB x10              Scapular retraction x 10              Cervical retraction x 10               B shoulder ER with green theraband x 10   ASSESSMENT:  CLINICAL IMPRESSION: Patient is a 71 y.o. male who was seen today for physical therapy evaluation and treatment for cervical radiculopathy. At this time the pt is completing all the normal work activities that he does, (weed eat, using power tools), he just notices that he has increased tingling and pain going down into his RT hand especially in the C6-7 , med nerve distribution.  He is waking up at night with his arm tingling.  Evaluation demonstrates increased pain, mild postural deficiency, mm spasms, and decreased cervical and Rt shoulder strengths.  Mark [name] will benefit from skilled PT for education, exercise and manual to decrease his pain and nerve irritation.     OBJECTIVE IMPAIRMENTS decreased strength, increased muscle spasms, postural dysfunction, and pain.   ACTIVITY LIMITATIONS sleeping and yard work  PARTICIPATION LIMITATIONS: yard work  PERSONAL FACTORS Time since onset of injury/illness/exacerbation and 1 comorbidity: Previous Rt shoulder surgery  are also affecting patient's functional outcome.   REHAB POTENTIAL: Good  CLINICAL DECISION  MAKING: Stable/uncomplicated  EVALUATION COMPLEXITY: Low   GOALS: Goals reviewed with patient? No  SHORT TERM GOALS: Target date: 05/22/2022   PT to be I in HEP to decrease pain to no greater than a 5/10  Baseline:  Goal status: INITIAL  2.  PT radicular sx to go to Rt elbow only to demonstrate decreased nerve irritation. Baseline:  Goal status: INITIAL  3.  PT to be more aware of proper sitting posture. Baseline:  Goal status: INITIAL   LONG TERM GOALS: Target date: 06/12/2022  Pt to be I in advanced HEP to decrease cervical and shoulder pain to no greater than a 2/10 Baseline:  Goal status: INITIAL  2.  PT to have no radicular sx for the past week  Baseline:  Goal status: INITIAL  3.  Pt strength in Rt UE to have increased 1/2 grade to improve functional use.  Baseline:  Goal status: INITIAL  4.  PT mm spasm in Rt upper trap to be minimal  Baseline:  Goal status: INITIAL   PLAN: PT FREQUENCY: 2x/week  PT DURATION: 6 weeks  PLANNED INTERVENTIONS: Therapeutic exercises, Therapeutic activity, Patient/Family education, traction and Manual therapy  PLAN FOR NEXT SESSION: Median nerve glide, theraband postural exerises, cervical isometric exercises progress cervical stability exercises.   Virgina Organ, PT CLT [phone]  05/01/2022, 11:15 AM

## 2022-05-05 ENCOUNTER — Ambulatory Visit (HOSPITAL_COMMUNITY): Payer: Medicare PPO | Attending: Family Medicine | Admitting: Physical Therapy

## 2022-05-05 DIAGNOSIS — M4802 Spinal stenosis, cervical region: Secondary | ICD-10-CM | POA: Insufficient documentation

## 2022-05-05 DIAGNOSIS — M5031 Other cervical disc degeneration,  high cervical region: Secondary | ICD-10-CM | POA: Diagnosis not present

## 2022-05-05 DIAGNOSIS — G542 Cervical root disorders, not elsewhere classified: Secondary | ICD-10-CM | POA: Diagnosis not present

## 2022-05-05 DIAGNOSIS — M5412 Radiculopathy, cervical region: Secondary | ICD-10-CM | POA: Diagnosis not present

## 2022-05-05 NOTE — Therapy (Signed)
OUTPATIENT PHYSICAL THERAPY TREATMENT    Patient Name: Mark [name] MRN: [id number] DOB:1951-05-07, 71 y.o., male Today's Date: 05/05/2022   PT End of Session - 05/05/22 1243     Visit Number 2    Number of Visits 12    Date for PT Re-Evaluation 06/12/22    Authorization Type [employer] approved 12 visits 6/15-7/27    Progress Note Due on Visit 10    PT Start Time 1050    PT Stop Time 1132    PT Time Calculation (min) 42 min              Past Medical History:  Diagnosis Date   Abnormal nuclear stress test false positive 05/2018   BPH (benign prostatic hyperplasia) 04/21/2019   Chest pain stable CAD with cath 06/07/18 possible GI 06/03/2018   Coronary artery disease    a. s/p CABG 2005. b. cath 05/2018 - patent LIMA-LAD, moderate LM and prox OM disease with neg FFR.   Dyslipidemia    Esophageal stricture    ESOPHAGEAL STRICTURE 12/18/2008   Qualifier: History of  By: Candice Camp CMA (AAMA), Dottie     ESOPHAGITIS 12/18/2008   Qualifier: History of  By: Candice Camp CMA (AAMA), Dottie     Essential hypertension, benign 04/06/2013   Gastrointestinal bleeding    GERD (gastroesophageal reflux disease)    History of colonic polyps 08/26/2014   [facility] [place] during colonoscopy in May of 2015. They recommend repeat colonoscopy May of 2017 do to inadequate prep, pathology showed hyperplastic polyp    Hyperlipidemia    Hypothyroidism    Insomnia 04/21/2019   Obstructive sleep apnea 07/11/2013   Has mixed apnea, CPAP setting 7    OSA (obstructive sleep apnea)    RECTAL BLEEDING 12/18/2008   Qualifier: Diagnosis of  By: Candice Camp CMA (AAMA), Dottie     Urinary retention    Vitiligo 07/11/2013   Past Surgical History:  Procedure Laterality Date   APPENDECTOMY     bilateral inguinal hernia repair     CARDIAC CATHETERIZATION  12/2005, 06/2005   COLONOSCOPY     CORONARY ARTERY BYPASS GRAFT  2006   LIMA to LAD, off pump   INTRAVASCULAR PRESSURE  WIRE/FFR STUDY N/A 06/07/2018   Procedure: INTRAVASCULAR PRESSURE WIRE/FFR STUDY;  Surgeon: Marykay Lex, MD;  Location: [facility] INVASIVE CV LAB;  Service: Cardiovascular;  Laterality: N/A;   LEFT HEART CATH AND CORS/GRAFTS ANGIOGRAPHY N/A 06/07/2018   Procedure: LEFT HEART CATH AND CORS/GRAFTS ANGIOGRAPHY;  Surgeon: Marykay Lex, MD;  Location: [facility] INVASIVE CV LAB;  Service: Cardiovascular;  Laterality: N/A;   OPEN REDUCTION INTERNAL FIXATION (ORIF) HAND Left 07/21/2015   Procedure: IRRIGATION AND DEBRIDMENT LEFT INDEX AND MIDDLE FINGER, OPEN REDUCTION INTERNAL FIXATION LEFT INDEX FINGER.;  Surgeon: Dominica Severin, MD;  Location: [facility] OR;  Service: Orthopedics;  Laterality: Left;   Patient Active Problem List   Diagnosis Date Noted   Calculus of gallbladder without cholecystitis without obstruction 02/20/2021   Fatty liver 02/20/2021   Insomnia 04/21/2019   BPH (benign prostatic hyperplasia) 04/21/2019   Angina pectoris (HCC)    Abnormal nuclear stress test false positive    Chest pain stable CAD with cath 06/07/18 possible GI 06/03/2018   History of colonic polyps 08/26/2014   Obstructive sleep apnea 07/11/2013   Vitiligo 07/11/2013   Essential hypertension, benign 04/06/2013   CORONARY ATHEROSCLEROSIS NATIVE CORONARY ARTERY 11/20/2009   Hypothyroidism 12/18/2008   Hyperlipidemia 12/18/2008   ESOPHAGITIS 12/18/2008  ESOPHAGEAL STRICTURE 12/18/2008   RECTAL BLEEDING 12/18/2008  PCP: Babs Sciara, MD  REFERRING PROVIDER: Babs Sciara, MD  REFERRING DIAG:   G54.2 (ICD-10-CM) - Cervical nerve root impingement   THERAPY DIAG:  Cervical radiculopathy  Rationale for Evaluation and Treatment Rehabilitation  ONSET DATE: acute exacerbation 01/29/2022  SUBJECTIVE:                                                                                                                                                                                                         SUBJECTIVE  STATEMENT: Pt states his neck feels a little better today, still having tingling down his Rt arm into his index and thumb.  States he rolled up a towel for sleeping at night and has really reduced his shoulder pain at night.    PERTINENT HISTORY:  ORIF RT UE; cardiac cath, Rt shoulder surgery to repair a ligament 12 years ago.   PAIN:  Are you having pain? Yes: NPRS scale: 4/10 Pain location: posterior cervical area and Rt shoulder 2-3 Pain description: intermittent pain  Aggravating factors: using vibrating tools, positioning Relieving factors: recliner with a pillow under his neck   PRECAUTIONS: None  WEIGHT BEARING RESTRICTIONS No  FALLS:  Has patient fallen in last 6 months? No  LIVING ENVIRONMENT: Lives with: lives with their family Lives in: House/apartment OCCUPATION: retired   PLOF: Independent  PATIENT GOALS less pain and radicular sx   OBJECTIVE:   DIAGNOSTIC FINDINGS:  IMPRESSION: Multilevel degenerative disc and endplate changes with neuroforaminal narrowing greatest at the bilateral C3-4 and C4-5 levels.    PATIENT SURVEYS:  FOTO 64   COGNITION: Overall cognitive status: Within functional limits for tasks assessed   SENSATION: Not tested  POSTURE: rounded shoulders   PALPATION: Moderate mm spasm in upper trap.   CERVICAL ROM:   Active ROM A/PROM (deg) eval  Flexion   Extension WNL  pt head slightly SB to rt:  reps increases tingling.    Right lateral flexion WNL reps makes no change.   Left lateral flexion WNL reps increases pain down arm   Right rotation   Left rotation    (Blank rows = not tested)  Rt cervical SB isometric:  3+/5 LT cervical SB isometric: 3+/5  Extension:  3+/5   UPPER EXTREMITY MMT:  MMT Right eval Left eval  Shoulder flexion 4/5 5/5  Shoulder extension    Shoulder abduction 4+/5 5/5  Shoulder adduction    Shoulder extension    Shoulder internal rotation 4/5 4+/5  Shoulder external rotation 3/5  3+/5   Middle trapezius    Lower trapezius    Elbow flexion 4+/5 4+/5  Elbow extension 4+/5 5/5  Wrist flexion 5/5 5/5  Wrist extension 5/5 5/5  Wrist ulnar deviation    Wrist radial deviation    Wrist pronation    Wrist supination    Grip strength 52# each rep had decreased grip strength.  57# each rep had decreased grip strength   (Blank rows = not tested)   PATIENT SURVEYS:  FOTO 64  TODAY'S TREATMENT:  05/05/22 Seated:  scapular retraction 10X  Rt cervical side bending 10X each side  Cervical retraction 10X  Bil shoulder ER with GTB 10X  UE flexion in good posturing 10X  Cervical isometric SB and extension 10X3" holds Standing: UE flexion against wall  UE arch facing wall, scap squeeze 10X5"  Corner stretch 4X20"  Medial nerve glide 10X5" Manual : STM to rt UT in seated position  6/15:  evaluation:  HEP,posture education    PATIENT EDUCATION:  Education details: HEP,posture  Person educated: Patient Education method: Explanation Education comprehension: returned demonstration   HOME EXERCISE PROGRAM: [id number] Sitting:  Rt cervical SB x10              Scapular retraction x 10              Cervical retraction x 10               B shoulder ER with green theraband x 10   ASSESSMENT:  CLINICAL IMPRESSION: Reveiwed goals, HEP and POC moving forward.  Pt able to demonstrate all exercises given at HEP correctly with minimal cues. Pt reported radiculopathy into Rt UE when completing cervical retractions.  Progressed with postural strengthening this session as well as chest stretch and nerve glide.  Manual completed to Rt UT with large spasm, reduced but not resolved.  Pt reported no change in symptoms at end of session.  Pt will continue to benefit from skilled PT for education, exercise and manual to decrease his pain and nerve irritation.     OBJECTIVE IMPAIRMENTS decreased strength, increased muscle spasms, postural dysfunction, and pain.   ACTIVITY LIMITATIONS sleeping  and yard work  PARTICIPATION LIMITATIONS: yard work  PERSONAL FACTORS Time since onset of injury/illness/exacerbation and 1 comorbidity: Previous Rt shoulder surgery  are also affecting patient's functional outcome.   REHAB POTENTIAL: Good  CLINICAL DECISION MAKING: Stable/uncomplicated  EVALUATION COMPLEXITY: Low   GOALS: Goals reviewed with patient? Yes  SHORT TERM GOALS: Target date: 05/26/2022   PT to be I in HEP to decrease pain to no greater than a 5/10  Baseline:  Goal status: IN PROGRESS  2.  PT radicular sx to go to Rt elbow only to demonstrate decreased nerve irritation. Baseline:  Goal status: IN PROGRESS  3.  PT to be more aware of proper sitting posture. Baseline:  Goal status: IN PROGRESS   LONG TERM GOALS: Target date: 06/16/2022  Pt to be I in advanced HEP to decrease cervical and shoulder pain to no greater than a 2/10 Baseline:  Goal status: IN PROGRESS  2.  PT to have no radicular sx for the past week  Baseline:  Goal status: IN PROGRESS  3.  Pt strength in Rt UE to have increased 1/2 grade to improve functional use.  Baseline:  Goal status: IN PROGRESS  4.  PT mm spasm in Rt upper trap to be minimal  Baseline:  Goal status: IN PROGRESS   PLAN: PT  FREQUENCY: 2x/week  PT DURATION: 6 weeks  PLANNED INTERVENTIONS: Therapeutic exercises, Therapeutic activity, Patient/Family education, traction and Manual therapy  PLAN FOR NEXT SESSION: Median nerve glide, theraband postural exerises, cervical isometric exercises progress cervical stability exercises.   Lurena Nida, PTA/CLT [facility] [place] Ph: [phone]  05/05/2022, 11:15 AM

## 2022-05-07 ENCOUNTER — Ambulatory Visit (HOSPITAL_COMMUNITY): Payer: Medicare PPO | Attending: Family Medicine | Admitting: Physical Therapy

## 2022-05-07 DIAGNOSIS — M5412 Radiculopathy, cervical region: Secondary | ICD-10-CM | POA: Insufficient documentation

## 2022-05-07 NOTE — Therapy (Signed)
OUTPATIENT PHYSICAL THERAPY TREATMENT    Patient Name: Mark [name] MRN: [id number] DOB:03/29/1951, 71 y.o., male Today's Date: 05/07/2022   PT End of Session - 05/07/22 0920     Visit Number 3    Number of Visits 12    Date for PT Re-Evaluation 06/12/22    Authorization Type [employer] approved 12 visits 6/15-7/27    Progress Note Due on Visit 10    PT Start Time 0920    PT Stop Time 1000    PT Time Calculation (min) 40 min    Activity Tolerance Patient tolerated treatment well    Behavior During Therapy [facility] for tasks assessed/performed              Past Medical History:  Diagnosis Date   Abnormal nuclear stress test false positive 05/2018   BPH (benign prostatic hyperplasia) 04/21/2019   Chest pain stable CAD with cath 06/07/18 possible GI 06/03/2018   Coronary artery disease    a. s/p CABG 2005. b. cath 05/2018 - patent LIMA-LAD, moderate LM and prox OM disease with neg FFR.   Dyslipidemia    Esophageal stricture    ESOPHAGEAL STRICTURE 12/18/2008   Qualifier: History of  By: Candice Camp CMA (AAMA), Dottie     ESOPHAGITIS 12/18/2008   Qualifier: History of  By: Candice Camp CMA (AAMA), Dottie     Essential hypertension, benign 04/06/2013   Gastrointestinal bleeding    GERD (gastroesophageal reflux disease)    History of colonic polyps 08/26/2014   [facility] [place] during colonoscopy in May of 2015. They recommend repeat colonoscopy May of 2017 do to inadequate prep, pathology showed hyperplastic polyp    Hyperlipidemia    Hypothyroidism    Insomnia 04/21/2019   Obstructive sleep apnea 07/11/2013   Has mixed apnea, CPAP setting 7    OSA (obstructive sleep apnea)    RECTAL BLEEDING 12/18/2008   Qualifier: Diagnosis of  By: Candice Camp CMA (AAMA), Dottie     Urinary retention    Vitiligo 07/11/2013   Past Surgical History:  Procedure Laterality Date   APPENDECTOMY     bilateral inguinal hernia repair     CARDIAC CATHETERIZATION  12/2005,  06/2005   COLONOSCOPY     CORONARY ARTERY BYPASS GRAFT  2006   LIMA to LAD, off pump   INTRAVASCULAR PRESSURE WIRE/FFR STUDY N/A 06/07/2018   Procedure: INTRAVASCULAR PRESSURE WIRE/FFR STUDY;  Surgeon: Marykay Lex, MD;  Location: [facility] INVASIVE CV LAB;  Service: Cardiovascular;  Laterality: N/A;   LEFT HEART CATH AND CORS/GRAFTS ANGIOGRAPHY N/A 06/07/2018   Procedure: LEFT HEART CATH AND CORS/GRAFTS ANGIOGRAPHY;  Surgeon: Marykay Lex, MD;  Location: [facility] INVASIVE CV LAB;  Service: Cardiovascular;  Laterality: N/A;   OPEN REDUCTION INTERNAL FIXATION (ORIF) HAND Left 07/21/2015   Procedure: IRRIGATION AND DEBRIDMENT LEFT INDEX AND MIDDLE FINGER, OPEN REDUCTION INTERNAL FIXATION LEFT INDEX FINGER.;  Surgeon: Dominica Severin, MD;  Location: [facility] OR;  Service: Orthopedics;  Laterality: Left;   Patient Active Problem List   Diagnosis Date Noted   Calculus of gallbladder without cholecystitis without obstruction 02/20/2021   Fatty liver 02/20/2021   Insomnia 04/21/2019   BPH (benign prostatic hyperplasia) 04/21/2019   Angina pectoris (HCC)    Abnormal nuclear stress test false positive    Chest pain stable CAD with cath 06/07/18 possible GI 06/03/2018   History of colonic polyps 08/26/2014   Obstructive sleep apnea 07/11/2013   Vitiligo 07/11/2013   Essential hypertension, benign 04/06/2013  CORONARY ATHEROSCLEROSIS NATIVE CORONARY ARTERY 11/20/2009   Hypothyroidism 12/18/2008   Hyperlipidemia 12/18/2008   ESOPHAGITIS 12/18/2008   ESOPHAGEAL STRICTURE 12/18/2008   RECTAL BLEEDING 12/18/2008  PCP: Babs Sciara, MD  REFERRING PROVIDER: Babs Sciara, MD  REFERRING DIAG:   G54.2 (ICD-10-CM) - Cervical nerve root impingement   THERAPY DIAG:  Cervical radiculopathy  Rationale for Evaluation and Treatment Rehabilitation  ONSET DATE: acute exacerbation 01/29/2022  SUBJECTIVE:                                                                                                                                                                                                          SUBJECTIVE STATEMENT:   Pt states that his tingling has improved but is still there.  His shoulder pain is better.  PERTINENT HISTORY:  ORIF RT UE; cardiac cath, Rt shoulder surgery to repair a ligament 12 years ago.   PAIN:  Are you having pain? Yes: NPRS scale: 4/10 Pain location: posterior cervical area and Rt shoulder 2-3 Pain description: intermittent pain  Aggravating factors: using vibrating tools, positioning Relieving factors: recliner with a pillow under his neck    PATIENT GOALS less pain and radicular sx   OBJECTIVE:   DIAGNOSTIC FINDINGS:  IMPRESSION: Multilevel degenerative disc and endplate changes with neuroforaminal narrowing greatest at the bilateral C3-4 and C4-5 levels.   POSTURE: rounded shoulders   PALPATION: Moderate mm spasm in upper trap.   CERVICAL ROM:   Active ROM A/PROM (deg) eval  Flexion   Extension WNL  pt head slightly SB to rt:  reps increases tingling.    Right lateral flexion WNL reps makes no change.   Left lateral flexion WNL reps increases pain down arm   Right rotation   Left rotation    (Blank rows = not tested)  Rt cervical SB isometric:  3+/5 LT cervical SB isometric: 3+/5  Extension:  3+/5   UPPER EXTREMITY MMT:  MMT Right eval Left eval  Shoulder flexion 4/5 5/5  Shoulder extension    Shoulder abduction 4+/5 5/5  Shoulder adduction    Shoulder extension    Shoulder internal rotation 4/5 4+/5  Shoulder external rotation 3/5 3+/5  Middle trapezius    Lower trapezius    Elbow flexion 4+/5 4+/5  Elbow extension 4+/5 5/5  Wrist flexion 5/5 5/5  Wrist extension 5/5 5/5  Wrist ulnar deviation    Wrist radial deviation    Wrist pronation    Wrist supination    Grip strength 52# each rep had decreased grip strength.  57# each rep had decreased grip strength   (Blank rows = not tested)   PATIENT SURVEYS:  FOTO 64  TODAY'S  TREATMENT:             05/07/22 Seated:  scapular and cervical retraction combined 10X  Rt cervical side bending 10X  UE lifting with 2# wt in good posturing 10X  Cervical isometric SB and extension 10X3" holds            W back 2# x10 Standing:             Postural exercises:  green t-band  scapular retraction, rows, shoulder extension x 10            UE flexion against wall  UE arch facing wall, scap squeeze 10X5"  Corner stretch 4X20"  Medial nerve glide 10X5" Manual : STM to rt UT in supine position, suboccipital release, trial of tx but pt states this increases tingling in his arm  05/05/22 Seated:  scapular retraction 10X  Rt cervical side bending 10X each side  Cervical retraction 10X  Bil shoulder ER with GTB 10X  UE flexion in good posturing 10X  Cervical isometric SB and extension 10X3" holds Standing: UE flexion against wall  UE arch facing wall, scap squeeze 10X5"  Corner stretch 4X20"  Medial nerve glide 10X5" Manual : STM to rt UT in seated position  6/15:  evaluation:  HEP,posture education    PATIENT EDUCATION:  Education details: HEP,posture  Person educated: Patient Education method: Explanation Education comprehension: returned demonstration   HOME EXERCISE PROGRAM: [id number] Sitting:  Rt cervical SB x10              Scapular retraction x 10              Cervical retraction x 10               B shoulder ER with green theraband x 10   ASSESSMENT:  CLINICAL IMPRESSION: Pt needs verbal cuing to keep cervical area stabilized while completing exercises and activities with his hands.  Therapist stressed the importance of tightening cervical mm to protect the cervical area when he is lifting, pushing or pulling.  PT will continue to  benefit from skilled therapy to progress cervical stabilization. OBJECTIVE IMPAIRMENTS decreased strength, increased muscle spasms, postural dysfunction, and pain.   ACTIVITY LIMITATIONS sleeping and yard work  PARTICIPATION  LIMITATIONS: yard work  PERSONAL FACTORS Time since onset of injury/illness/exacerbation and 1 comorbidity: Previous Rt shoulder surgery  are also affecting patient's functional outcome.   REHAB POTENTIAL: Good  CLINICAL DECISION MAKING: Stable/uncomplicated  EVALUATION COMPLEXITY: Low   GOALS: Goals reviewed with patient? Yes  SHORT TERM GOALS: Target date: 05/28/2022   PT to be I in HEP to decrease pain to no greater than a 5/10  Baseline:  Goal status: IN PROGRESS  2.  PT radicular sx to go to Rt elbow only to demonstrate decreased nerve irritation. Baseline:  Goal status: IN PROGRESS  3.  PT to be more aware of proper sitting posture. Baseline:  Goal status: IN PROGRESS   LONG TERM GOALS: Target date: 06/18/2022  Pt to be I in advanced HEP to decrease cervical and shoulder pain to no greater than a 2/10 Baseline:  Goal status: IN PROGRESS  2.  PT to have no radicular sx for the past week  Baseline:  Goal status: IN PROGRESS  3.  Pt strength in Rt UE to have increased 1/2 grade to  improve functional use.  Baseline:  Goal status: IN PROGRESS  4.  PT mm spasm in Rt upper trap to be minimal  Baseline:  Goal status: IN PROGRESS   PLAN: PT FREQUENCY: 2x/week  PT DURATION: 6 weeks  PLANNED INTERVENTIONS: Therapeutic exercises, Therapeutic activity, Patient/Family education, traction and Manual therapy  PLAN FOR NEXT SESSION: wall push up  exercises progress cervical stability exercises.   Virgina Organ, PT CLT [phone] 05/07/2022, 11:15 AM

## 2022-05-12 ENCOUNTER — Ambulatory Visit (HOSPITAL_COMMUNITY): Payer: Medicare PPO | Attending: Family Medicine | Admitting: Physical Therapy

## 2022-05-12 DIAGNOSIS — M5412 Radiculopathy, cervical region: Secondary | ICD-10-CM | POA: Insufficient documentation

## 2022-05-12 NOTE — Therapy (Signed)
OUTPATIENT PHYSICAL THERAPY TREATMENT    Patient Name: Mark [name] MRN: [id number] DOB:September 12, 1951, 71 y.o., male Today's Date: 05/12/2022   PT End of Session - 05/12/22 1237     Visit Number 4    Number of Visits 12    Date for PT Re-Evaluation 06/12/22    Authorization Type [employer] approved 12 visits 6/15-7/27    Progress Note Due on Visit 10    PT Start Time 0916    PT Stop Time 1000    PT Time Calculation (min) 44 min    Activity Tolerance Patient tolerated treatment well    Behavior During Therapy [facility] for tasks assessed/performed               Past Medical History:  Diagnosis Date   Abnormal nuclear stress test false positive 05/2018   BPH (benign prostatic hyperplasia) 04/21/2019   Chest pain stable CAD with cath 06/07/18 possible GI 06/03/2018   Coronary artery disease    a. s/p CABG 2005. b. cath 05/2018 - patent LIMA-LAD, moderate LM and prox OM disease with neg FFR.   Dyslipidemia    Esophageal stricture    ESOPHAGEAL STRICTURE 12/18/2008   Qualifier: History of  By: Candice Camp CMA (AAMA), Dottie     ESOPHAGITIS 12/18/2008   Qualifier: History of  By: Candice Camp CMA (AAMA), Dottie     Essential hypertension, benign 04/06/2013   Gastrointestinal bleeding    GERD (gastroesophageal reflux disease)    History of colonic polyps 08/26/2014   [facility] [place] during colonoscopy in May of 2015. They recommend repeat colonoscopy May of 2017 do to inadequate prep, pathology showed hyperplastic polyp    Hyperlipidemia    Hypothyroidism    Insomnia 04/21/2019   Obstructive sleep apnea 07/11/2013   Has mixed apnea, CPAP setting 7    OSA (obstructive sleep apnea)    RECTAL BLEEDING 12/18/2008   Qualifier: Diagnosis of  By: Candice Camp CMA (AAMA), Dottie     Urinary retention    Vitiligo 07/11/2013   Past Surgical History:  Procedure Laterality Date   APPENDECTOMY     bilateral inguinal hernia repair     CARDIAC CATHETERIZATION   12/2005, 06/2005   COLONOSCOPY     CORONARY ARTERY BYPASS GRAFT  2006   LIMA to LAD, off pump   INTRAVASCULAR PRESSURE WIRE/FFR STUDY N/A 06/07/2018   Procedure: INTRAVASCULAR PRESSURE WIRE/FFR STUDY;  Surgeon: Marykay Lex, MD;  Location: [facility] INVASIVE CV LAB;  Service: Cardiovascular;  Laterality: N/A;   LEFT HEART CATH AND CORS/GRAFTS ANGIOGRAPHY N/A 06/07/2018   Procedure: LEFT HEART CATH AND CORS/GRAFTS ANGIOGRAPHY;  Surgeon: Marykay Lex, MD;  Location: [facility] INVASIVE CV LAB;  Service: Cardiovascular;  Laterality: N/A;   OPEN REDUCTION INTERNAL FIXATION (ORIF) HAND Left 07/21/2015   Procedure: IRRIGATION AND DEBRIDMENT LEFT INDEX AND MIDDLE FINGER, OPEN REDUCTION INTERNAL FIXATION LEFT INDEX FINGER.;  Surgeon: Dominica Severin, MD;  Location: [facility] OR;  Service: Orthopedics;  Laterality: Left;   Patient Active Problem List   Diagnosis Date Noted   Calculus of gallbladder without cholecystitis without obstruction 02/20/2021   Fatty liver 02/20/2021   Insomnia 04/21/2019   BPH (benign prostatic hyperplasia) 04/21/2019   Angina pectoris (HCC)    Abnormal nuclear stress test false positive    Chest pain stable CAD with cath 06/07/18 possible GI 06/03/2018   History of colonic polyps 08/26/2014   Obstructive sleep apnea 07/11/2013   Vitiligo 07/11/2013   Essential hypertension, benign 04/06/2013  CORONARY ATHEROSCLEROSIS NATIVE CORONARY ARTERY 11/20/2009   Hypothyroidism 12/18/2008   Hyperlipidemia 12/18/2008   ESOPHAGITIS 12/18/2008   ESOPHAGEAL STRICTURE 12/18/2008   RECTAL BLEEDING 12/18/2008  PCP: Babs Sciara, MD  REFERRING PROVIDER: Babs Sciara, MD  REFERRING DIAG:   G54.2 (ICD-10-CM) - Cervical nerve root impingement   THERAPY DIAG:  Cervical radiculopathy  Rationale for Evaluation and Treatment Rehabilitation  ONSET DATE: acute exacerbation 01/29/2022  SUBJECTIVE:                                                                                                                                                                                                          SUBJECTIVE STATEMENT:   Pt states that his tingling has improved but is still there.  His shoulder pain is better.  PERTINENT HISTORY:  ORIF RT UE; cardiac cath, Rt shoulder surgery to repair a ligament 12 years ago.   PAIN:  Are you having pain? Yes: NPRS scale: 4/10 Pain location: posterior cervical area and Rt shoulder 2-3 Pain description: intermittent pain  Aggravating factors: using vibrating tools, positioning Relieving factors: recliner with a pillow under his neck    PATIENT GOALS less pain and radicular sx   OBJECTIVE:   DIAGNOSTIC FINDINGS:  IMPRESSION: Multilevel degenerative disc and endplate changes with neuroforaminal narrowing greatest at the bilateral C3-4 and C4-5 levels.   POSTURE: rounded shoulders   PALPATION: Moderate mm spasm in upper trap.   CERVICAL ROM:   Active ROM A/PROM (deg) eval  Flexion   Extension WNL  pt head slightly SB to rt:  reps increases tingling.    Right lateral flexion WNL reps makes no change.   Left lateral flexion WNL reps increases pain down arm   Right rotation   Left rotation    (Blank rows = not tested)  Rt cervical SB isometric:  3+/5 LT cervical SB isometric: 3+/5  Extension:  3+/5   UPPER EXTREMITY MMT:  MMT Right eval Left eval  Shoulder flexion 4/5 5/5  Shoulder extension    Shoulder abduction 4+/5 5/5  Shoulder adduction    Shoulder extension    Shoulder internal rotation 4/5 4+/5  Shoulder external rotation 3/5 3+/5  Middle trapezius    Lower trapezius    Elbow flexion 4+/5 4+/5  Elbow extension 4+/5 5/5  Wrist flexion 5/5 5/5  Wrist extension 5/5 5/5  Wrist ulnar deviation    Wrist radial deviation    Wrist pronation    Wrist supination    Grip strength 52# each rep had decreased grip strength.  57# each rep had decreased grip strength   (Blank rows = not tested)   PATIENT SURVEYS:  FOTO  64  TODAY'S TREATMENT:     05/12/22    UBE 4 minutes backward level 1 Standing:  RTB scapular retraction 2X10 RTB Rows 2X10 RTB extensions 2X10 Wall push ups 10X Seated: 3D Cervical excursions 10X each Lateral raise 2X10 2#DB UE flexion 2X10 2# DB WBack 2#DB 2X10  Manual: STM to Rt UT in seated position     05/07/22 Seated:  scapular and cervical retraction combined 10X  Rt cervical side bending 10X  UE lifting with 2# wt in good posturing 10X  Cervical isometric SB and extension 10X3" holds            W back 2# x10 Standing:             Postural exercises:  green t-band  scapular retraction, rows, shoulder extension x 10            UE flexion against wall  UE arch facing wall, scap squeeze 10X5"  Corner stretch 4X20"  Medial nerve glide 10X5" Manual : STM to rt UT in supine position, suboccipital release, trial of tx but pt states this increases tingling in his arm   05/05/22 Seated:  scapular retraction 10X  Rt cervical side bending 10X each side  Cervical retraction 10X  Bil shoulder ER with GTB 10X  UE flexion in good posturing 10X  Cervical isometric SB and extension 10X3" holds Standing: UE flexion against wall  UE arch facing wall, scap squeeze 10X5"  Corner stretch 4X20"  Medial nerve glide 10X5" Manual : STM to rt UT in seated position  6/15:  evaluation:  HEP,posture education    PATIENT EDUCATION:  Education details: HEP,posture  Person educated: Patient Education method: Explanation Education comprehension: returned demonstration   HOME EXERCISE PROGRAM: [id number] Sitting:  Rt cervical SB x10              Scapular retraction x 10              Cervical retraction x 10               B shoulder ER with green theraband x 10   ASSESSMENT:  CLINICAL IMPRESSION: Continued with focus on improving cervical stability, posture and reducing pain. Began session with UBE and progressed with addition of wall push ups per PT plan.  Pt with several incidences of Rt  UE tingling with therex and instructed to complete in 1/2 ROM or effort to reduce.  Large spasm Rt UT with trigger point, increasing symptoms into Rt hand with palpation.  Unable to fully resolve spasm but was able to reduce.  Pt verbalized improvement following manual.  Pt with questions regarding use of heat/ice.  Continued with manual to help reduce tightness/spasm in Rt UT.  PT will continue to  benefit from skilled therapy to progress cervical stabilization.  OBJECTIVE IMPAIRMENTS decreased strength, increased muscle spasms, postural dysfunction, and pain.   ACTIVITY LIMITATIONS sleeping and yard work  PARTICIPATION LIMITATIONS: yard work  PERSONAL FACTORS Time since onset of injury/illness/exacerbation and 1 comorbidity: Previous Rt shoulder surgery  are also affecting patient's functional outcome.   REHAB POTENTIAL: Good  CLINICAL DECISION MAKING: Stable/uncomplicated  EVALUATION COMPLEXITY: Low   GOALS: Goals reviewed with patient? Yes  SHORT TERM GOALS: Target date: 06/02/2022   PT to be I in HEP to decrease pain to no greater than a 5/10  Baseline:  Goal  status: IN PROGRESS  2.  PT radicular sx to go to Rt elbow only to demonstrate decreased nerve irritation. Baseline:  Goal status: IN PROGRESS  3.  PT to be more aware of proper sitting posture. Baseline:  Goal status: IN PROGRESS   LONG TERM GOALS: Target date: 06/23/2022  Pt to be I in advanced HEP to decrease cervical and shoulder pain to no greater than a 2/10 Baseline:  Goal status: IN PROGRESS  2.  PT to have no radicular sx for the past week  Baseline:  Goal status: IN PROGRESS  3.  Pt strength in Rt UE to have increased 1/2 grade to improve functional use.  Baseline:  Goal status: IN PROGRESS  4.  PT mm spasm in Rt upper trap to be minimal  Baseline:  Goal status: IN PROGRESS   PLAN: PT FREQUENCY: 2x/week  PT DURATION: 6 weeks  PLANNED INTERVENTIONS: Therapeutic exercises, Therapeutic  activity, Patient/Family education, traction and Manual therapy  PLAN FOR NEXT SESSION: Progress cervical stability exercises. Manual to help reduce spasm and pain.  Lurena Nida, PTA/CLT [facility] [place] Ph: [phone] 05/12/2022, 11:15 AM

## 2022-05-14 ENCOUNTER — Ambulatory Visit (HOSPITAL_COMMUNITY): Payer: Medicare PPO | Admitting: Physical Therapy

## 2022-05-14 DIAGNOSIS — M5412 Radiculopathy, cervical region: Secondary | ICD-10-CM | POA: Diagnosis not present

## 2022-05-14 NOTE — Therapy (Signed)
OUTPATIENT PHYSICAL THERAPY TREATMENT    Patient Name: Mark [name] MRN: [id number] DOB:[birth year], 71 y.o., male Today's Date: 05/14/2022   PT End of Session - 05/14/22 0913     Visit Number 5    Number of Visits 12    Date for PT Re-Evaluation 06/12/22    Authorization Type [employer] approved 12 visits 6/15-7/27    Progress Note Due on Visit 10    PT Start Time 0915    PT Stop Time 1000    PT Time Calculation (min) 45 min    Activity Tolerance Patient tolerated treatment well    Behavior During Therapy [facility] for tasks assessed/performed               Past Medical History:  Diagnosis Date   Abnormal nuclear stress test false positive 05/2018   BPH (benign prostatic hyperplasia) 04/21/2019   Chest pain stable CAD with cath 06/07/18 possible GI 06/03/2018   Coronary artery disease    a. s/p CABG 2005. b. cath 05/2018 - patent LIMA-LAD, moderate LM and prox OM disease with neg FFR.   Dyslipidemia    Esophageal stricture    ESOPHAGEAL STRICTURE 12/18/2008   Qualifier: History of  By: Candice Camp CMA (AAMA), Dottie     ESOPHAGITIS 12/18/2008   Qualifier: History of  By: Candice Camp CMA (AAMA), Dottie     Essential hypertension, benign 04/06/2013   Gastrointestinal bleeding    GERD (gastroesophageal reflux disease)    History of colonic polyps 08/26/2014   [facility] [place] during colonoscopy in May of 2015. They recommend repeat colonoscopy May of 2017 do to inadequate prep, pathology showed hyperplastic polyp    Hyperlipidemia    Hypothyroidism    Insomnia 04/21/2019   Obstructive sleep apnea 07/11/2013   Has mixed apnea, CPAP setting 7    OSA (obstructive sleep apnea)    RECTAL BLEEDING 12/18/2008   Qualifier: Diagnosis of  By: Candice Camp CMA (AAMA), Dottie     Urinary retention    Vitiligo 07/11/2013   Past Surgical History:  Procedure Laterality Date   APPENDECTOMY     bilateral inguinal hernia repair     CARDIAC CATHETERIZATION   12/2005, 06/2005   COLONOSCOPY     CORONARY ARTERY BYPASS GRAFT  2006   LIMA to LAD, off pump   INTRAVASCULAR PRESSURE WIRE/FFR STUDY N/A 06/07/2018   Procedure: INTRAVASCULAR PRESSURE WIRE/FFR STUDY;  Surgeon: Marykay Lex, MD;  Location: [facility] INVASIVE CV LAB;  Service: Cardiovascular;  Laterality: N/A;   LEFT HEART CATH AND CORS/GRAFTS ANGIOGRAPHY N/A 06/07/2018   Procedure: LEFT HEART CATH AND CORS/GRAFTS ANGIOGRAPHY;  Surgeon: Marykay Lex, MD;  Location: [facility] INVASIVE CV LAB;  Service: Cardiovascular;  Laterality: N/A;   OPEN REDUCTION INTERNAL FIXATION (ORIF) HAND Left 07/21/2015   Procedure: IRRIGATION AND DEBRIDMENT LEFT INDEX AND MIDDLE FINGER, OPEN REDUCTION INTERNAL FIXATION LEFT INDEX FINGER.;  Surgeon: Dominica Severin, MD;  Location: [facility] OR;  Service: Orthopedics;  Laterality: Left;   Patient Active Problem List   Diagnosis Date Noted   Calculus of gallbladder without cholecystitis without obstruction 02/20/2021   Fatty liver 02/20/2021   Insomnia 04/21/2019   BPH (benign prostatic hyperplasia) 04/21/2019   Angina pectoris (HCC)    Abnormal nuclear stress test false positive    Chest pain stable CAD with cath 06/07/18 possible GI 06/03/2018   History of colonic polyps 08/26/2014   Obstructive sleep apnea 07/11/2013   Vitiligo 07/11/2013   Essential hypertension, benign 04/06/2013  CORONARY ATHEROSCLEROSIS NATIVE CORONARY ARTERY 11/20/2009   Hypothyroidism 12/18/2008   Hyperlipidemia 12/18/2008   ESOPHAGITIS 12/18/2008   ESOPHAGEAL STRICTURE 12/18/2008   RECTAL BLEEDING 12/18/2008  PCP: Babs Sciara, MD  REFERRING PROVIDER: Babs Sciara, MD  REFERRING DIAG:   G54.2 (ICD-10-CM) - Cervical nerve root impingement   THERAPY DIAG:  Cervical radiculopathy  Rationale for Evaluation and Treatment Rehabilitation  ONSET DATE: acute exacerbation 01/29/2022  SUBJECTIVE:                                                                                                                                                                                                          SUBJECTIVE STATEMENT:   Pt states that his tingling is better, intermittent now, overall better.  States he did wake last night with pain, may have been his positioning or way he was sleeping. States he took a tylenol and went back to bed.   Minimal pain this morning.  PERTINENT HISTORY:  ORIF RT UE; cardiac cath, Rt shoulder surgery to repair a ligament 12 years ago.   PAIN:  Are you having pain? Yes: NPRS scale: 3/10 Pain location: posterior cervical area and Rt shoulder 2-3 Pain description: intermittent pain  Aggravating factors: using vibrating tools, positioning Relieving factors: recliner with a pillow under his neck    PATIENT GOALS less pain and radicular sx   OBJECTIVE:   DIAGNOSTIC FINDINGS:  IMPRESSION: Multilevel degenerative disc and endplate changes with neuroforaminal narrowing greatest at the bilateral C3-4 and C4-5 levels.   POSTURE: rounded shoulders   PALPATION: Moderate mm spasm in upper trap.   CERVICAL ROM:   Active ROM A/PROM (deg) eval  Flexion   Extension WNL  pt head slightly SB to rt:  reps increases tingling.    Right lateral flexion WNL reps makes no change.   Left lateral flexion WNL reps increases pain down arm   Right rotation   Left rotation    (Blank rows = not tested)  Rt cervical SB isometric:  3+/5 LT cervical SB isometric: 3+/5  Extension:  3+/5   UPPER EXTREMITY MMT:  MMT Right eval Left eval  Shoulder flexion 4/5 5/5  Shoulder extension    Shoulder abduction 4+/5 5/5  Shoulder adduction    Shoulder extension    Shoulder internal rotation 4/5 4+/5  Shoulder external rotation 3/5 3+/5  Middle trapezius    Lower trapezius    Elbow flexion 4+/5 4+/5  Elbow extension 4+/5 5/5  Wrist flexion 5/5 5/5  Wrist extension 5/5 5/5  Wrist ulnar deviation  Wrist radial deviation    Wrist pronation    Wrist supination    Grip  strength 52# each rep had decreased grip strength.  57# each rep had decreased grip strength   (Blank rows = not tested)   PATIENT SURVEYS:  FOTO 64  TODAY'S TREATMENT:   05/14/22    UBE 4 minutes backward level 1 Standing:  GTB scapular retraction 2X10 GTB Rows 2X10 GTB extensions 2X10 Wall push ups 10X2 Seated: 3D Cervical excursions 10X each Lateral raise 2X10 2#DB UE flexion 2X10 2# DB WBack 2#DB 2X10  Manual: STM to Rt UT in seated position       05/12/22    UBE 4 minutes backward level 1 Standing:  RTB scapular retraction 2X10 RTB Rows 2X10 RTB extensions 2X10 Wall push ups 10X Seated: 3D Cervical excursions 10X each Lateral raise 2X10 2#DB UE flexion 2X10 2# DB WBack 2#DB 2X10  Manual: STM to Rt UT in seated position     05/07/22 Seated:  scapular and cervical retraction combined 10X  Rt cervical side bending 10X  UE lifting with 2# wt in good posturing 10X  Cervical isometric SB and extension 10X3" holds            W back 2# x10 Standing:             Postural exercises:  green t-band  scapular retraction, rows, shoulder extension x 10            UE flexion against wall  UE arch facing wall, scap squeeze 10X5"  Corner stretch 4X20"  Medial nerve glide 10X5" Manual : STM to rt UT in supine position, suboccipital release, trial of tx but pt states this increases tingling in his arm   05/05/22 Seated:  scapular retraction 10X  Rt cervical side bending 10X each side  Cervical retraction 10X  Bil shoulder ER with GTB 10X  UE flexion in good posturing 10X  Cervical isometric SB and extension 10X3" holds Standing: UE flexion against wall  UE arch facing wall, scap squeeze 10X5"  Corner stretch 4X20"  Medial nerve glide 10X5" Manual : STM to rt UT in seated position  6/15:  evaluation:  HEP,posture education    PATIENT EDUCATION:  Education details: HEP,posture  Person educated: Patient Education method: Explanation Education comprehension: returned  demonstration   HOME EXERCISE PROGRAM: [id number] Sitting:  Rt cervical SB x10              Scapular retraction x 10              Cervical retraction x 10               B shoulder ER with green theraband x 10   ASSESSMENT:  CLINICAL IMPRESSION: Continued with focus on improving cervical stability, posture and reducing pain. No reported incidences of Rt UE tingling with therex this session.  Increased wall push up to 2 sets.  Continued with manual to Rt UT with less reaction at trigger point location with much smaller spasm palpated in this area as well.  Pt verbalized improvement following manual.  PT will continue to  benefit from skilled therapy to progress cervical stabilization.  OBJECTIVE IMPAIRMENTS decreased strength, increased muscle spasms, postural dysfunction, and pain.   ACTIVITY LIMITATIONS sleeping and yard work  PARTICIPATION LIMITATIONS: yard work  PERSONAL FACTORS Time since onset of injury/illness/exacerbation and 1 comorbidity: Previous Rt shoulder surgery  are also affecting patient's functional outcome.   REHAB POTENTIAL: Good  CLINICAL DECISION MAKING: Stable/uncomplicated  EVALUATION COMPLEXITY: Low   GOALS: Goals reviewed with patient? Yes  SHORT TERM GOALS: Target date: 06/04/2022   PT to be I in HEP to decrease pain to no greater than a 5/10  Baseline:  Goal status: IN PROGRESS  2.  PT radicular sx to go to Rt elbow only to demonstrate decreased nerve irritation. Baseline:  Goal status: IN PROGRESS  3.  PT to be more aware of proper sitting posture. Baseline:  Goal status: IN PROGRESS   LONG TERM GOALS: Target date: 06/25/2022  Pt to be I in advanced HEP to decrease cervical and shoulder pain to no greater than a 2/10 Baseline:  Goal status: IN PROGRESS  2.  PT to have no radicular sx for the past week  Baseline:  Goal status: IN PROGRESS  3.  Pt strength in Rt UE to have increased 1/2 grade to improve functional use.  Baseline:  Goal  status: IN PROGRESS  4.  PT mm spasm in Rt upper trap to be minimal  Baseline:  Goal status: IN PROGRESS   PLAN: PT FREQUENCY: 2x/week  PT DURATION: 6 weeks  PLANNED INTERVENTIONS: Therapeutic exercises, Therapeutic activity, Patient/Family education, traction and Manual therapy  PLAN FOR NEXT SESSION: Progress cervical stability exercises. Manual to help reduce spasm and pain.  Lurena Nida, PTA/CLT [facility] Ph: [phone] 05/14/2022, 11:15 AM

## 2022-05-21 ENCOUNTER — Ambulatory Visit (HOSPITAL_COMMUNITY): Payer: Medicare PPO | Attending: Family Medicine | Admitting: Physical Therapy

## 2022-05-21 ENCOUNTER — Encounter: Payer: Self-pay | Admitting: Family Medicine

## 2022-05-21 ENCOUNTER — Ambulatory Visit (INDEPENDENT_AMBULATORY_CARE_PROVIDER_SITE_OTHER): Payer: Medicare PPO | Admitting: Family Medicine

## 2022-05-21 VITALS — BP 132/72 | Wt 203.8 lb

## 2022-05-21 DIAGNOSIS — E785 Hyperlipidemia, unspecified: Secondary | ICD-10-CM

## 2022-05-21 DIAGNOSIS — I10 Essential (primary) hypertension: Secondary | ICD-10-CM

## 2022-05-21 DIAGNOSIS — R079 Chest pain, unspecified: Secondary | ICD-10-CM | POA: Diagnosis not present

## 2022-05-21 DIAGNOSIS — M5412 Radiculopathy, cervical region: Secondary | ICD-10-CM | POA: Insufficient documentation

## 2022-05-21 DIAGNOSIS — R1013 Epigastric pain: Secondary | ICD-10-CM

## 2022-05-21 DIAGNOSIS — E039 Hypothyroidism, unspecified: Secondary | ICD-10-CM | POA: Diagnosis not present

## 2022-05-21 DIAGNOSIS — Z79899 Other long term (current) drug therapy: Secondary | ICD-10-CM

## 2022-05-21 MED ORDER — METOPROLOL SUCCINATE ER 25 MG PO TB24: 12.5000 mg | ORAL_TABLET | Freq: Every day | ORAL | 1 refills | Status: DC

## 2022-05-21 MED ORDER — ALPRAZOLAM 0.25 MG PO TABS: 0.2500 mg | ORAL_TABLET | Freq: Every evening | ORAL | 4 refills | Status: DC | PRN

## 2022-05-21 MED ORDER — TAMSULOSIN HCL 0.4 MG PO CAPS
0.4000 mg | ORAL_CAPSULE | Freq: Every day | ORAL | 1 refills | Status: DC
Start: 2022-05-21 — End: 2022-12-12

## 2022-05-21 MED ORDER — LEVOTHYROXINE SODIUM 100 MCG PO TABS
100.0000 ug | ORAL_TABLET | Freq: Every day | ORAL | 1 refills | Status: DC
Start: 2022-05-21 — End: 2022-12-12

## 2022-05-21 MED ORDER — ATORVASTATIN CALCIUM 80 MG PO TABS: 80.0000 mg | ORAL_TABLET | Freq: Every day | ORAL | 1 refills | Status: DC

## 2022-05-21 NOTE — Therapy (Signed)
OUTPATIENT PHYSICAL THERAPY TREATMENT    Patient Name: Mark [name] MRN: [id number] DOB:1951/09/05, 71 y.o., male Today's Date: 05/21/2022   PT End of Session - 05/21/22 1327     Visit Number 6    Number of Visits 12    Date for PT Re-Evaluation 06/12/22    Authorization Type [employer] approved 12 visits 6/15-7/27    Authorization - Visit Number 6    Authorization - Number of Visits 12    Progress Note Due on Visit 10    PT Start Time 1122    PT Stop Time 1200    PT Time Calculation (min) 38 min    Activity Tolerance Patient tolerated treatment well    Behavior During Therapy [facility] for tasks assessed/performed                Past Medical History:  Diagnosis Date   Abnormal nuclear stress test false positive 05/2018   BPH (benign prostatic hyperplasia) 04/21/2019   Chest pain stable CAD with cath 06/07/18 possible GI 06/03/2018   Coronary artery disease    a. s/p CABG 2005. b. cath 05/2018 - patent LIMA-LAD, moderate LM and prox OM disease with neg FFR.   Dyslipidemia    Esophageal stricture    ESOPHAGEAL STRICTURE 12/18/2008   Qualifier: History of  By: Candice Camp CMA (AAMA), Dottie     ESOPHAGITIS 12/18/2008   Qualifier: History of  By: Candice Camp CMA (AAMA), Dottie     Essential hypertension, benign 04/06/2013   Gastrointestinal bleeding    GERD (gastroesophageal reflux disease)    History of colonic polyps 08/26/2014   [facility] [place] during colonoscopy in May of 2015. They recommend repeat colonoscopy May of 2017 do to inadequate prep, pathology showed hyperplastic polyp    Hyperlipidemia    Hypothyroidism    Insomnia 04/21/2019   Obstructive sleep apnea 07/11/2013   Has mixed apnea, CPAP setting 7    OSA (obstructive sleep apnea)    RECTAL BLEEDING 12/18/2008   Qualifier: Diagnosis of  By: Candice Camp CMA (AAMA), Dottie     Urinary retention    Vitiligo 07/11/2013   Past Surgical History:  Procedure Laterality Date    APPENDECTOMY     bilateral inguinal hernia repair     CARDIAC CATHETERIZATION  12/2005, 06/2005   COLONOSCOPY     CORONARY ARTERY BYPASS GRAFT  2006   LIMA to LAD, off pump   INTRAVASCULAR PRESSURE WIRE/FFR STUDY N/A 06/07/2018   Procedure: INTRAVASCULAR PRESSURE WIRE/FFR STUDY;  Surgeon: Marykay Lex, MD;  Location: [facility] INVASIVE CV LAB;  Service: Cardiovascular;  Laterality: N/A;   LEFT HEART CATH AND CORS/GRAFTS ANGIOGRAPHY N/A 06/07/2018   Procedure: LEFT HEART CATH AND CORS/GRAFTS ANGIOGRAPHY;  Surgeon: Marykay Lex, MD;  Location: [facility] INVASIVE CV LAB;  Service: Cardiovascular;  Laterality: N/A;   OPEN REDUCTION INTERNAL FIXATION (ORIF) HAND Left 07/21/2015   Procedure: IRRIGATION AND DEBRIDMENT LEFT INDEX AND MIDDLE FINGER, OPEN REDUCTION INTERNAL FIXATION LEFT INDEX FINGER.;  Surgeon: Dominica Severin, MD;  Location: [facility] OR;  Service: Orthopedics;  Laterality: Left;   Patient Active Problem List   Diagnosis Date Noted   Calculus of gallbladder without cholecystitis without obstruction 02/20/2021   Fatty liver 02/20/2021   Insomnia 04/21/2019   BPH (benign prostatic hyperplasia) 04/21/2019   Angina pectoris (HCC)    Abnormal nuclear stress test false positive    Chest pain stable CAD with cath 06/07/18 possible GI 06/03/2018   History of colonic polyps  08/26/2014   Obstructive sleep apnea 07/11/2013   Vitiligo 07/11/2013   Essential hypertension, benign 04/06/2013   CORONARY ATHEROSCLEROSIS NATIVE CORONARY ARTERY 11/20/2009   Hypothyroidism 12/18/2008   Hyperlipidemia 12/18/2008   ESOPHAGITIS 12/18/2008   ESOPHAGEAL STRICTURE 12/18/2008   RECTAL BLEEDING 12/18/2008  PCP: Babs Sciara, MD  REFERRING PROVIDER: Babs Sciara, MD  REFERRING DIAG:   G54.2 (ICD-10-CM) - Cervical nerve root impingement   THERAPY DIAG:  Cervical radiculopathy  Rationale for Evaluation and Treatment Rehabilitation  ONSET DATE: acute exacerbation 01/29/2022  SUBJECTIVE:                                                                                                                                                                                                          SUBJECTIVE STATEMENT:   Pt states he had pain last night and used the ice pack on his neck which made it better.  Reports intermittent pain and tingling now into shoulder and UE and pain of 4/10 in cervical area.  PERTINENT HISTORY:  ORIF RT UE; cardiac cath, Rt shoulder surgery to repair a ligament 12 years ago.   PAIN:2 Are you having pain? Yes: NPRS scale: 4/10 Pain location: posterior cervical area and Rt shoulder 2-3 Pain description: intermittent pain  Aggravating factors: using vibrating tools, positioning Relieving factors: recliner with a pillow under his neck    PATIENT GOALS less pain and radicular sx   OBJECTIVE:   DIAGNOSTIC FINDINGS:  IMPRESSION: Multilevel degenerative disc and endplate changes with neuroforaminal narrowing greatest at the bilateral C3-4 and C4-5 levels.   POSTURE: rounded shoulders   PALPATION: Moderate mm spasm in upper trap.   CERVICAL ROM:   Active ROM A/PROM (deg) eval  Flexion   Extension WNL  pt head slightly SB to rt:  reps increases tingling.    Right lateral flexion WNL reps makes no change.   Left lateral flexion WNL reps increases pain down arm   Right rotation   Left rotation    (Blank rows = not tested)  Rt cervical SB isometric:  3+/5 LT cervical SB isometric: 3+/5  Extension:  3+/5   UPPER EXTREMITY MMT:  MMT Right eval Left eval  Shoulder flexion 4/5 5/5  Shoulder extension    Shoulder abduction 4+/5 5/5  Shoulder adduction    Shoulder extension    Shoulder internal rotation 4/5 4+/5  Shoulder external rotation 3/5 3+/5  Middle trapezius    Lower trapezius    Elbow flexion 4+/5 4+/5  Elbow extension 4+/5 5/5  Wrist flexion 5/5 5/5  Wrist extension 5/5 5/5  Wrist ulnar deviation    Wrist radial deviation    Wrist pronation     Wrist supination    Grip strength 52# each rep had decreased grip strength.  57# each rep had decreased grip strength   (Blank rows = not tested)   PATIENT SURVEYS:  FOTO 64  TODAY'S TREATMENT:   05/21/22    UBE 4 minutes backward level 1 Standing:  GTB scapular retraction 2X10 GTB Rows 2X10 GTB extensions 2X10 Wall push ups 10X2 Lateral raise 2X10 2#DB UE flexion 2X10 2# DB WBack 2#DB 2X10  UE press ups 2# 2X10 Manual: STM to Rt UT in seated position     05/14/22    UBE 4 minutes backward level 1 Standing:  GTB scapular retraction 2X10 GTB Rows 2X10 GTB extensions 2X10 Wall push ups 10X2 Seated: 3D Cervical excursions 10X each Lateral raise 2X10 2#DB UE flexion 2X10 2# DB WBack 2#DB 2X10  Manual: STM to Rt UT in seated position       05/12/22    UBE 4 minutes backward level 1 Standing:  RTB scapular retraction 2X10 RTB Rows 2X10 RTB extensions 2X10 Wall push ups 10X Seated: 3D Cervical excursions 10X each Lateral raise 2X10 2#DB UE flexion 2X10 2# DB WBack 2#DB 2X10  Manual: STM to Rt UT in seated position     05/07/22 Seated:  scapular and cervical retraction combined 10X  Rt cervical side bending 10X  UE lifting with 2# wt in good posturing 10X  Cervical isometric SB and extension 10X3" holds            W back 2# x10 Standing:             Postural exercises:  green t-band  scapular retraction, rows, shoulder extension x 10            UE flexion against wall  UE arch facing wall, scap squeeze 10X5"  Corner stretch 4X20"  Medial nerve glide 10X5" Manual : STM to rt UT in supine position, suboccipital release, trial of tx but pt states this increases tingling in his arm   05/05/22 Seated:  scapular retraction 10X  Rt cervical side bending 10X each side  Cervical retraction 10X  Bil shoulder ER with GTB 10X  UE flexion in good posturing 10X  Cervical isometric SB and extension 10X3" holds Standing: UE flexion against wall  UE arch facing wall, scap  squeeze 10X5"  Corner stretch 4X20"  Medial nerve glide 10X5" Manual : STM to rt UT in seated position  6/15:  evaluation:  HEP,posture education    PATIENT EDUCATION:  Education details: HEP,posture  Person educated: Patient Education method: Explanation Education comprehension: returned demonstration   HOME EXERCISE PROGRAM: [id number] Sitting:  Rt cervical SB x10              Scapular retraction x 10              Cervical retraction x 10               B shoulder ER with green theraband x 10   ASSESSMENT:  CLINICAL IMPRESSION: Continued with focus on improving cervical stability, posture and reducing pain. No Rt UE tingling with therex this session, however did have some shoulder discomfort. Continued with UE strengthening with dumbbells with overall good form.   Manual to Rt UT with less reaction at trigger point location with much smaller spasm palpated in this area as well.  PT  will continue to  benefit from skilled therapy to progress cervical stabilization.  OBJECTIVE IMPAIRMENTS decreased strength, increased muscle spasms, postural dysfunction, and pain.   ACTIVITY LIMITATIONS sleeping and yard work  PARTICIPATION LIMITATIONS: yard work  PERSONAL FACTORS Time since onset of injury/illness/exacerbation and 1 comorbidity: Previous Rt shoulder surgery  are also affecting patient's functional outcome.   REHAB POTENTIAL: Good  CLINICAL DECISION MAKING: Stable/uncomplicated  EVALUATION COMPLEXITY: Low   GOALS: Goals reviewed with patient? Yes  SHORT TERM GOALS: Target date: 06/11/2022   PT to be I in HEP to decrease pain to no greater than a 5/10  Baseline:  Goal status: IN PROGRESS  2.  PT radicular sx to go to Rt elbow only to demonstrate decreased nerve irritation. Baseline:  Goal status: IN PROGRESS  3.  PT to be more aware of proper sitting posture. Baseline:  Goal status: IN PROGRESS   LONG TERM GOALS: Target date: 07/02/2022  Pt to be I in advanced  HEP to decrease cervical and shoulder pain to no greater than a 2/10 Baseline:  Goal status: IN PROGRESS  2.  PT to have no radicular sx for the past week  Baseline:  Goal status: IN PROGRESS  3.  Pt strength in Rt UE to have increased 1/2 grade to improve functional use.  Baseline:  Goal status: IN PROGRESS  4.  PT mm spasm in Rt upper trap to be minimal  Baseline:  Goal status: IN PROGRESS   PLAN: PT FREQUENCY: 2x/week  PT DURATION: 6 weeks  PLANNED INTERVENTIONS: Therapeutic exercises, Therapeutic activity, Patient/Family education, traction and Manual therapy  PLAN FOR NEXT SESSION: Progress cervical stability exercises. Manual to help reduce spasm and pain.  Increase to blue theraband next session and give for HEP  Lurena Nida, PTA/CLT [facility] [place] Ph: [phone] 05/21/2022, 11:15 AM

## 2022-05-21 NOTE — Progress Notes (Signed)
   Subjective:    Patient ID: Mark [name], male    DOB: March 12, 1951, 71 y.o.   MRN: [id number]  HPI Pt here for follow up. Pt states he had some chest (middle of chest area) this past weekend. Pt also states he became nauseous and vomited. Took 2 nitros at first then a little while later pt took another one that did help. Pt states he did eat a lot of sauteed onions, cabbage and other foods that may have cause indigestion. Pt states blood pressure has been doing well; doesn't check blood pressure regular.   Patient states he had a Fair amount of food about 3 hours later he had epigastric pain discomfort did not radiate into the chest did not radiate down the arm did feel nauseous with it he threw up once he states that the symptoms improved.  He has not had any recurrence since then  Overall energy level doing okay.  He does walk multiple times a week on a regular basis and denies any chest pain or shortness of breath with that.  He does have underlying heart disease and has not had any anginal symptoms recently Has thyroid disease takes his medicine regular basis Hyperlipidemia takes his medication  Review of Systems     Objective:   Physical Exam  General-in no acute distress Eyes-no discharge Lungs-respiratory rate normal, CTA CV-no murmurs,RRR Extremities skin warm dry no edema Neuro grossly normal Behavior normal, alert  Abdominal exam soft no guarding rebound or tenderness     Assessment & Plan:  1. Chest pain, unspecified type I do not find any evidence of acute changes on the EKG it is normal compared to previous it is the same we will keep cardiology in the loop.  When they see him in the near future they can help decide if he may need an additional stress test.  The likelihood of heart disease from this particular episode is low I think it is more likely related to what he ate. - EKG 12-Lead  2. Epigastric abdominal pain More than likely had secondary issues from  what he ate and that the food just did not sit well with him.  There is no need to do any type of CAT scan today his abdominal exam is soft  We will do blood work.  Also ultrasound to look for the possibility of gallstones.  If he has reoccurring troubles may need EGD may need CAT scan  May use Tums in the future - Hepatic function panel - Lipase - CBC with Differential - US Abdomen Limited RUQ (LIVER/GB)  3. Hypothyroidism, unspecified type Continue medication check thyroid function - TSH - Lipid panel - Hepatic function panel - Basic metabolic panel - Lipase - CBC with Differential  4. Hyperlipidemia, unspecified hyperlipidemia type Continue medication check lipid - TSH - Lipid panel - Hepatic function panel - Basic metabolic panel - Lipase - CBC with Differential  5. High risk medication use Labs ordered - TSH - Lipid panel - Hepatic function panel - Basic metabolic panel - Lipase - CBC with Differential  6. Essential hypertension, benign Blood pressure good control continue current measures healthy diet regular activity - TSH - Lipid panel - Hepatic function panel - Basic metabolic panel - Lipase - CBC with Differential

## 2022-05-22 ENCOUNTER — Ambulatory Visit (HOSPITAL_COMMUNITY)
Admission: RE | Admit: 2022-05-22 | Discharge: 2022-05-22 | Disposition: A | Discharge status: Home / Self Care | Payer: Medicare PPO | Source: Ambulatory Visit | Attending: Family Medicine | Admitting: Family Medicine

## 2022-05-22 DIAGNOSIS — R1013 Epigastric pain: Secondary | ICD-10-CM | POA: Diagnosis not present

## 2022-05-22 DIAGNOSIS — K8020 Calculus of gallbladder without cholecystitis without obstruction: Secondary | ICD-10-CM | POA: Diagnosis not present

## 2022-05-22 LAB — CBC WITH DIFFERENTIAL/PLATELET
Basophils Absolute: 0.1 10*3/uL (ref 0.0–0.2)
Basos: 1 %
EOS (ABSOLUTE): 0.3 10*3/uL (ref 0.0–0.4)
Eos: 7 %
Hematocrit: 40.9 % (ref 37.5–51.0)
Hemoglobin: 14.1 g/dL (ref 13.0–17.7)
Immature Grans (Abs): 0 10*3/uL (ref 0.0–0.1)
Immature Granulocytes: 1 %
Lymphocytes Absolute: 1.4 10*3/uL (ref 0.7–3.1)
Lymphs: 32 %
MCH: 31.6 pg (ref 26.6–33.0)
MCHC: 34.5 g/dL (ref 31.5–35.7)
MCV: 92 fL (ref 79–97)
Monocytes Absolute: 0.5 10*3/uL (ref 0.1–0.9)
Monocytes: 11 %
Neutrophils Absolute: 2.1 10*3/uL (ref 1.4–7.0)
Neutrophils: 48 %
Platelets: 169 10*3/uL (ref 150–450)
RBC: 4.46 x10E6/uL (ref 4.14–5.80)
RDW: 12.3 % (ref 11.6–15.4)
WBC: 4.4 10*3/uL (ref 3.4–10.8)

## 2022-05-22 LAB — BASIC METABOLIC PANEL
BUN/Creatinine Ratio: 12 (ref 10–24)
BUN: 12 mg/dL (ref 8–27)
CO2: 24 mmol/L (ref 20–29)
Calcium: 9.4 mg/dL (ref 8.6–10.2)
Chloride: 102 mmol/L (ref 96–106)
Creatinine, Ser: 0.99 mg/dL (ref 0.76–1.27)
Glucose: 109 mg/dL — ABNORMAL HIGH (ref 70–99)
Potassium: 4.6 mmol/L (ref 3.5–5.2)
Sodium: 137 mmol/L (ref 134–144)
eGFR: 81 mL/min/{1.73_m2} (ref >59)

## 2022-05-22 LAB — HEPATIC FUNCTION PANEL
ALT: 10 IU/L (ref 0–44)
AST: 13 IU/L (ref 0–40)
Albumin: 4.1 g/dL (ref 3.7–4.7)
Alkaline Phosphatase: 57 IU/L (ref 44–121)
Bilirubin Total: 1.4 mg/dL — ABNORMAL HIGH (ref 0.0–1.2)
Bilirubin, Direct: 0.29 mg/dL (ref 0.00–0.40)
Total Protein: 6.5 g/dL (ref 6.0–8.5)

## 2022-05-22 LAB — LIPID PANEL
Chol/HDL Ratio: 3.5 ratio (ref 0.0–5.0)
Cholesterol, Total: 107 mg/dL (ref 100–199)
HDL: 31 mg/dL — ABNORMAL LOW (ref >39)
LDL Chol Calc (NIH): 41 mg/dL (ref 0–99)
Triglycerides: 216 mg/dL — ABNORMAL HIGH (ref 0–149)
VLDL Cholesterol Cal: 35 mg/dL (ref 5–40)

## 2022-05-22 LAB — LIPASE: Lipase: 31 U/L (ref 13–78)

## 2022-05-22 LAB — TSH: TSH: 2.47 u[IU]/mL (ref 0.450–4.500)

## 2022-05-23 ENCOUNTER — Ambulatory Visit (HOSPITAL_COMMUNITY): Payer: Medicare PPO | Admitting: Physical Therapy

## 2022-05-23 DIAGNOSIS — M5412 Radiculopathy, cervical region: Secondary | ICD-10-CM | POA: Diagnosis not present

## 2022-05-23 NOTE — Therapy (Signed)
OUTPATIENT PHYSICAL THERAPY TREATMENT    Patient Name: Mark [name] MRN: [id number] DOB:06-18-1951, 71 y.o., male Today's Date: 05/23/2022   PT End of Session - 05/23/22 1009     Visit Number 7    Number of Visits 12    Date for PT Re-Evaluation 06/12/22    Authorization Type [employer] approved 12 visits 6/15-7/27    Authorization - Visit Number 7    Authorization - Number of Visits 12    Progress Note Due on Visit 10    PT Start Time 0948    PT Stop Time 1028    PT Time Calculation (min) 40 min    Activity Tolerance Patient tolerated treatment well    Behavior During Therapy [facility] for tasks assessed/performed                Past Medical History:  Diagnosis Date   Abnormal nuclear stress test false positive 05/2018   BPH (benign prostatic hyperplasia) 04/21/2019   Chest pain stable CAD with cath 06/07/18 possible GI 06/03/2018   Coronary artery disease    a. s/p CABG 2005. b. cath 05/2018 - patent LIMA-LAD, moderate LM and prox OM disease with neg FFR.   Dyslipidemia    Esophageal stricture    ESOPHAGEAL STRICTURE 12/18/2008   Qualifier: History of  By: Candice Camp CMA (AAMA), Dottie     ESOPHAGITIS 12/18/2008   Qualifier: History of  By: Candice Camp CMA (AAMA), Dottie     Essential hypertension, benign 04/06/2013   Gastrointestinal bleeding    GERD (gastroesophageal reflux disease)    History of colonic polyps 08/26/2014   [facility] [place] during colonoscopy in May of 2015. They recommend repeat colonoscopy May of 2017 do to inadequate prep, pathology showed hyperplastic polyp    Hyperlipidemia    Hypothyroidism    Insomnia 04/21/2019   Obstructive sleep apnea 07/11/2013   Has mixed apnea, CPAP setting 7    OSA (obstructive sleep apnea)    RECTAL BLEEDING 12/18/2008   Qualifier: Diagnosis of  By: Candice Camp CMA (AAMA), Dottie     Urinary retention    Vitiligo 07/11/2013   Past Surgical History:  Procedure Laterality Date    APPENDECTOMY     bilateral inguinal hernia repair     CARDIAC CATHETERIZATION  12/2005, 06/2005   COLONOSCOPY     CORONARY ARTERY BYPASS GRAFT  2006   LIMA to LAD, off pump   INTRAVASCULAR PRESSURE WIRE/FFR STUDY N/A 06/07/2018   Procedure: INTRAVASCULAR PRESSURE WIRE/FFR STUDY;  Surgeon: Marykay Lex, MD;  Location: [facility] INVASIVE CV LAB;  Service: Cardiovascular;  Laterality: N/A;   LEFT HEART CATH AND CORS/GRAFTS ANGIOGRAPHY N/A 06/07/2018   Procedure: LEFT HEART CATH AND CORS/GRAFTS ANGIOGRAPHY;  Surgeon: Marykay Lex, MD;  Location: [facility] INVASIVE CV LAB;  Service: Cardiovascular;  Laterality: N/A;   OPEN REDUCTION INTERNAL FIXATION (ORIF) HAND Left 07/21/2015   Procedure: IRRIGATION AND DEBRIDMENT LEFT INDEX AND MIDDLE FINGER, OPEN REDUCTION INTERNAL FIXATION LEFT INDEX FINGER.;  Surgeon: Dominica Severin, MD;  Location: [facility] OR;  Service: Orthopedics;  Laterality: Left;   Patient Active Problem List   Diagnosis Date Noted   Calculus of gallbladder without cholecystitis without obstruction 02/20/2021   Fatty liver 02/20/2021   Insomnia 04/21/2019   BPH (benign prostatic hyperplasia) 04/21/2019   Angina pectoris (HCC)    Abnormal nuclear stress test false positive    Chest pain stable CAD with cath 06/07/18 possible GI 06/03/2018   History of colonic polyps  08/26/2014   Obstructive sleep apnea 07/11/2013   Vitiligo 07/11/2013   Essential hypertension, benign 04/06/2013   CORONARY ATHEROSCLEROSIS NATIVE CORONARY ARTERY 11/20/2009   Hypothyroidism 12/18/2008   Hyperlipidemia 12/18/2008   ESOPHAGITIS 12/18/2008   ESOPHAGEAL STRICTURE 12/18/2008   RECTAL BLEEDING 12/18/2008  PCP: Babs Sciara, MD  REFERRING PROVIDER: Babs Sciara, MD  REFERRING DIAG:   G54.2 (ICD-10-CM) - Cervical nerve root impingement   THERAPY DIAG:  Cervical radiculopathy  Rationale for Evaluation and Treatment Rehabilitation  ONSET DATE: acute exacerbation 01/29/2022  SUBJECTIVE:                                                                                                                                                                                                          SUBJECTIVE STATEMENT:   Pt states he had pain last night and used the ice pack on his neck which made it better.  Reports intermittent pain and tingling now into shoulder and UE and pain of 4/10 in cervical area.  PERTINENT HISTORY:  ORIF RT UE; cardiac cath, Rt shoulder surgery to repair a ligament 12 years ago.   PAIN:2 Are you having pain? Yes: NPRS scale: 4/10 Pain location: posterior cervical area and Rt shoulder 2-3 Pain description: intermittent pain  Aggravating factors: using vibrating tools, positioning Relieving factors: recliner with a pillow under his neck    PATIENT GOALS less pain and radicular sx   OBJECTIVE:   DIAGNOSTIC FINDINGS:  IMPRESSION: Multilevel degenerative disc and endplate changes with neuroforaminal narrowing greatest at the bilateral C3-4 and C4-5 levels.   POSTURE: rounded shoulders   PALPATION: Moderate mm spasm in upper trap.   CERVICAL ROM:   Active ROM A/PROM (deg) eval  Flexion   Extension WNL  pt head slightly SB to rt:  reps increases tingling.    Right lateral flexion WNL reps makes no change.   Left lateral flexion WNL reps increases pain down arm   Right rotation   Left rotation    (Blank rows = not tested)  Rt cervical SB isometric:  3+/5 LT cervical SB isometric: 3+/5  Extension:  3+/5   UPPER EXTREMITY MMT:  MMT Right eval Left eval  Shoulder flexion 4/5 5/5  Shoulder extension    Shoulder abduction 4+/5 5/5  Shoulder adduction    Shoulder extension    Shoulder internal rotation 4/5 4+/5  Shoulder external rotation 3/5 3+/5  Middle trapezius    Lower trapezius    Elbow flexion 4+/5 4+/5  Elbow extension 4+/5 5/5  Wrist flexion 5/5 5/5  Wrist extension 5/5 5/5  Wrist ulnar deviation    Wrist radial deviation    Wrist pronation     Wrist supination    Grip strength 52# each rep had decreased grip strength.  57# each rep had decreased grip strength   (Blank rows = not tested)   PATIENT SURVEYS:  FOTO 64  TODAY'S TREATMENT:  05/23/22 UBE 4 minutes backward level 2 Standing:  BTB Rows 2X10 BTB extensions 2X10 BTB ER/IR bilaterally 10X Wall push ups 10X2 Lateral raise 2X10 3# DB UE flexion 2X10 3# DB UE press ups 3# 2X10 Manual: STM to Rt UT in seated position     05/21/22    UBE 4 minutes backward level 1 Standing:  GTB scapular retraction 2X10 GTB Rows 2X10 GTB extensions 2X10 Wall push ups 10X2 Lateral raise 2X10 2#DB UE flexion 2X10 2# DB WBack 2#DB 2X10  UE press ups 2# 2X10 Manual: STM to Rt UT in seated position     05/14/22    UBE 4 minutes backward level 1 Standing:  GTB scapular retraction 2X10 GTB Rows 2X10 GTB extensions 2X10 Wall push ups 10X2 Seated: 3D Cervical excursions 10X each Lateral raise 2X10 2#DB UE flexion 2X10 2# DB WBack 2#DB 2X10  Manual: STM to Rt UT in seated position       05/12/22    UBE 4 minutes backward level 1 Standing:  RTB scapular retraction 2X10 RTB Rows 2X10 RTB extensions 2X10 Wall push ups 10X Seated: 3D Cervical excursions 10X each Lateral raise 2X10 2#DB UE flexion 2X10 2# DB WBack 2#DB 2X10  Manual: STM to Rt UT in seated position     05/07/22 Seated:  scapular and cervical retraction combined 10X  Rt cervical side bending 10X  UE lifting with 2# wt in good posturing 10X  Cervical isometric SB and extension 10X3" holds            W back 2# x10 Standing:             Postural exercises:  green t-band  scapular retraction, rows, shoulder extension x 10            UE flexion against wall  UE arch facing wall, scap squeeze 10X5"  Corner stretch 4X20"  Medial nerve glide 10X5" Manual : STM to rt UT in supine position, suboccipital release, trial of tx but pt states this increases tingling in his arm   05/05/22 Seated:  scapular retraction  10X  Rt cervical side bending 10X each side  Cervical retraction 10X  Bil shoulder ER with GTB 10X  UE flexion in good posturing 10X  Cervical isometric SB and extension 10X3" holds Standing: UE flexion against wall  UE arch facing wall, scap squeeze 10X5"  Corner stretch 4X20"  Medial nerve glide 10X5" Manual : STM to rt UT in seated position  6/15:  evaluation:  HEP,posture education    PATIENT EDUCATION:  Education details: HEP,posture  Person educated: Patient Education method: Explanation Education comprehension: returned demonstration   HOME EXERCISE PROGRAM: [id number] Sitting:  Rt cervical SB x10              Scapular retraction x 10              Cervical retraction x 10               B shoulder ER with green theraband x 10   ASSESSMENT:  CLINICAL IMPRESSION: Focus remains on improving cervical stability, posture and reducing pain. Increased  irritation yesterday following an all day shopping trip.  Increased to 3# DB with UE AROM challenge, strengthening for rotators and given blue theraband and instructions for HEP.  Some tingling down Rt UE when completing rows with theraband and press ups with DB but otherwise no symptoms voiced.   Manual to Rt UT with much improved tightness and minimal spasm palpated.  PT will continue to  benefit from skilled therapy to progress cervical stabilization.  OBJECTIVE IMPAIRMENTS decreased strength, increased muscle spasms, postural dysfunction, and pain.   ACTIVITY LIMITATIONS sleeping and yard work  PARTICIPATION LIMITATIONS: yard work  PERSONAL FACTORS Time since onset of injury/illness/exacerbation and 1 comorbidity: Previous Rt shoulder surgery  are also affecting patient's functional outcome.   REHAB POTENTIAL: Good  CLINICAL DECISION MAKING: Stable/uncomplicated  EVALUATION COMPLEXITY: Low   GOALS: Goals reviewed with patient? Yes  SHORT TERM GOALS: Target date: 06/13/2022   PT to be I in HEP to decrease pain to no  greater than a 5/10  Baseline:  Goal status: IN PROGRESS  2.  PT radicular sx to go to Rt elbow only to demonstrate decreased nerve irritation. Baseline:  Goal status: IN PROGRESS  3.  PT to be more aware of proper sitting posture. Baseline:  Goal status: IN PROGRESS   LONG TERM GOALS: Target date: 07/04/2022  Pt to be I in advanced HEP to decrease cervical and shoulder pain to no greater than a 2/10 Baseline:  Goal status: IN PROGRESS  2.  PT to have no radicular sx for the past week  Baseline:  Goal status: IN PROGRESS  3.  Pt strength in Rt UE to have increased 1/2 grade to improve functional use.  Baseline:  Goal status: IN PROGRESS  4.  PT mm spasm in Rt upper trap to be minimal  Baseline:  Goal status: IN PROGRESS   PLAN: PT FREQUENCY: 2x/week  PT DURATION: 6 weeks  PLANNED INTERVENTIONS: Therapeutic exercises, Therapeutic activity, Patient/Family education, traction and Manual therapy  PLAN FOR NEXT SESSION: Progress cervical stability exercises. Manual to help reduce spasm and pain.   Lurena Nida, PTA/CLT [facility] Ph: [phone] 05/23/2022, 11:15 AM

## 2022-05-26 ENCOUNTER — Other Ambulatory Visit: Payer: Self-pay

## 2022-05-26 ENCOUNTER — Encounter: Payer: Self-pay | Admitting: Family Medicine

## 2022-05-26 DIAGNOSIS — G542 Cervical root disorders, not elsewhere classified: Secondary | ICD-10-CM

## 2022-05-26 NOTE — Telephone Encounter (Signed)
Nurses I believe it is reasonable for the patient to proceed forward with MRI cervical spine because of cervical nerve impingement he was seen back in May he has tried conservative therapy for 6 weeks with physical therapy not seeing any improvement  Please move forward at getting this approved by insurance companies and set up thank you

## 2022-05-27 ENCOUNTER — Ambulatory Visit (HOSPITAL_COMMUNITY): Payer: Medicare PPO | Admitting: Physical Therapy

## 2022-05-27 ENCOUNTER — Other Ambulatory Visit: Payer: Self-pay | Admitting: *Deleted

## 2022-05-27 DIAGNOSIS — M5412 Radiculopathy, cervical region: Secondary | ICD-10-CM | POA: Diagnosis not present

## 2022-05-27 DIAGNOSIS — K8020 Calculus of gallbladder without cholecystitis without obstruction: Secondary | ICD-10-CM

## 2022-05-27 NOTE — Therapy (Signed)
OUTPATIENT PHYSICAL THERAPY TREATMENT    Patient Name: Mark [name] MRN: [id number] DOB:05-27-1951, 71 y.o., male Today's Date: 05/27/2022   PT End of Session - 05/27/22 0951     Visit Number 8    Number of Visits 12    Date for PT Re-Evaluation 06/12/22    Authorization Type [employer] approved 12 visits 6/15-7/27    Authorization - Visit Number 8    Authorization - Number of Visits 12    Progress Note Due on Visit 10    PT Start Time 0952    PT Stop Time 1032    PT Time Calculation (min) 40 min                Past Medical History:  Diagnosis Date   Abnormal nuclear stress test false positive 05/2018   BPH (benign prostatic hyperplasia) 04/21/2019   Chest pain stable CAD with cath 06/07/18 possible GI 06/03/2018   Coronary artery disease    a. s/p CABG 2005. b. cath 05/2018 - patent LIMA-LAD, moderate LM and prox OM disease with neg FFR.   Dyslipidemia    Esophageal stricture    ESOPHAGEAL STRICTURE 12/18/2008   Qualifier: History of  By: Candice Camp CMA (AAMA), Dottie     ESOPHAGITIS 12/18/2008   Qualifier: History of  By: Candice Camp CMA (AAMA), Dottie     Essential hypertension, benign 04/06/2013   Gastrointestinal bleeding    GERD (gastroesophageal reflux disease)    History of colonic polyps 08/26/2014   [facility] [place] during colonoscopy in May of 2015. They recommend repeat colonoscopy May of 2017 do to inadequate prep, pathology showed hyperplastic polyp    Hyperlipidemia    Hypothyroidism    Insomnia 04/21/2019   Obstructive sleep apnea 07/11/2013   Has mixed apnea, CPAP setting 7    OSA (obstructive sleep apnea)    RECTAL BLEEDING 12/18/2008   Qualifier: Diagnosis of  By: Candice Camp CMA (AAMA), Dottie     Urinary retention    Vitiligo 07/11/2013   Past Surgical History:  Procedure Laterality Date   APPENDECTOMY     bilateral inguinal hernia repair     CARDIAC CATHETERIZATION  12/2005, 06/2005   COLONOSCOPY     CORONARY  ARTERY BYPASS GRAFT  2006   LIMA to LAD, off pump   INTRAVASCULAR PRESSURE WIRE/FFR STUDY N/A 06/07/2018   Procedure: INTRAVASCULAR PRESSURE WIRE/FFR STUDY;  Surgeon: Marykay Lex, MD;  Location: [facility] INVASIVE CV LAB;  Service: Cardiovascular;  Laterality: N/A;   LEFT HEART CATH AND CORS/GRAFTS ANGIOGRAPHY N/A 06/07/2018   Procedure: LEFT HEART CATH AND CORS/GRAFTS ANGIOGRAPHY;  Surgeon: Marykay Lex, MD;  Location: [facility] INVASIVE CV LAB;  Service: Cardiovascular;  Laterality: N/A;   OPEN REDUCTION INTERNAL FIXATION (ORIF) HAND Left 07/21/2015   Procedure: IRRIGATION AND DEBRIDMENT LEFT INDEX AND MIDDLE FINGER, OPEN REDUCTION INTERNAL FIXATION LEFT INDEX FINGER.;  Surgeon: Dominica Severin, MD;  Location: [facility] OR;  Service: Orthopedics;  Laterality: Left;   Patient Active Problem List   Diagnosis Date Noted   Calculus of gallbladder without cholecystitis without obstruction 02/20/2021   Fatty liver 02/20/2021   Insomnia 04/21/2019   BPH (benign prostatic hyperplasia) 04/21/2019   Angina pectoris (HCC)    Abnormal nuclear stress test false positive    Chest pain stable CAD with cath 06/07/18 possible GI 06/03/2018   History of colonic polyps 08/26/2014   Obstructive sleep apnea 07/11/2013   Vitiligo 07/11/2013   Essential hypertension, benign 04/06/2013  CORONARY ATHEROSCLEROSIS NATIVE CORONARY ARTERY 11/20/2009   Hypothyroidism 12/18/2008   Hyperlipidemia 12/18/2008   ESOPHAGITIS 12/18/2008   ESOPHAGEAL STRICTURE 12/18/2008   RECTAL BLEEDING 12/18/2008  PCP: Babs Sciara, MD  REFERRING PROVIDER: Babs Sciara, MD  REFERRING DIAG:   G54.2 (ICD-10-CM) - Cervical nerve root impingement   THERAPY DIAG:  Cervical radiculopathy  Rationale for Evaluation and Treatment Rehabilitation  ONSET DATE: acute exacerbation 01/29/2022  SUBJECTIVE:                                                                                                                                                                                                          SUBJECTIVE STATEMENT:   Pt states that the extra strength tylenol is not helping like it was in the beginning. He is having more bad nights than good nights here lately.  He is going to have an MRI scheduled.   PERTINENT HISTORY:  ORIF RT UE; cardiac cath, Rt shoulder surgery to repair a ligament 12 years ago.   PAIN:2 Are you having pain? Yes: NPRS scale: 4/10 Pain location: posterior cervical area and Rt shoulder 2-3 Pain description: intermittent pain  Aggravating factors: using vibrating tools, positioning Relieving factors: recliner with a pillow under his neck    PATIENT GOALS less pain and radicular sx   OBJECTIVE:   DIAGNOSTIC FINDINGS:  IMPRESSION: Multilevel degenerative disc and endplate changes with neuroforaminal narrowing greatest at the bilateral C3-4 and C4-5 levels.   POSTURE: rounded shoulders   PALPATION: Moderate mm spasm in upper trap.   CERVICAL ROM:   Active ROM A/PROM (deg) eval  Flexion   Extension WNL  pt head slightly SB to rt:  reps increases tingling.    Right lateral flexion WNL reps makes no change.   Left lateral flexion WNL reps increases pain down arm   Right rotation   Left rotation    (Blank rows = not tested)  Rt cervical SB isometric:  3+/5 LT cervical SB isometric: 3+/5  Extension:  3+/5   UPPER EXTREMITY MMT:  MMT Right eval Left eval  Shoulder flexion 4/5 5/5  Shoulder extension    Shoulder abduction 4+/5 5/5  Shoulder adduction    Shoulder extension    Shoulder internal rotation 4/5 4+/5  Shoulder external rotation 3/5 3+/5  Middle trapezius    Lower trapezius    Elbow flexion 4+/5 4+/5  Elbow extension 4+/5 5/5  Wrist flexion 5/5 5/5  Wrist extension 5/5 5/5  Wrist ulnar deviation    Wrist radial deviation    Wrist  pronation    Wrist supination    Grip strength 52# each rep had decreased grip strength.  57# each rep had decreased grip strength   (Blank  rows = not tested)   PATIENT SURVEYS:  FOTO 64  TODAY'S TREATMENT:             05/27/22:            Supine:   Manual to include traction which was stopped due to increase radicular sx. Joint mobs and efflurage to decrease mm spasm.              Isometrics all x 10 5" ; pt states that cervical retraction increases radicular sx, manual traction increases radicular sx, manual at t4 increases radicular sx.             Scapular retraction x 15             Cervical excursion x 3             Thoracic excursion x 3              Retro shoulder circles with no cervical motion x 10             Red tband  Rt shoulder flexion x 10; then LT x 10            B ER with red theraband x 10             Wall arch x 5             Y-lift off x 5.   05/23/22 UBE 4 minutes backward level 2 Standing:  BTB Rows 2X10 BTB extensions 2X10 BTB ER/IR bilaterally 10X Wall push ups 10X2 Lateral raise 2X10 3# DB UE flexion 2X10 3# DB UE press ups 3# 2X10 Manual: STM to Rt UT in seated position     05/21/22    UBE 4 minutes backward level 1 Standing:  GTB scapular retraction 2X10 GTB Rows 2X10 GTB extensions 2X10 Wall push ups 10X2 Lateral raise 2X10 2#DB UE flexion 2X10 2# DB WBack 2#DB 2X10  UE press ups 2# 2X10 Manual: STM to Rt UT in seated position     05/14/22    UBE 4 minutes backward level 1 Standing:  GTB scapular retraction 2X10 GTB Rows 2X10 GTB extensions 2X10 Wall push ups 10X2 Seated: 3D Cervical excursions 10X each Lateral raise 2X10 2#DB UE flexion 2X10 2# DB WBack 2#DB 2X10  Manual: STM to Rt UT in seated position       05/12/22    UBE 4 minutes backward level 1 Standing:  RTB scapular retraction 2X10 RTB Rows 2X10 RTB extensions 2X10 Wall push ups 10X Seated: 3D Cervical excursions 10X each Lateral raise 2X10 2#DB UE flexion 2X10 2# DB WBack 2#DB 2X10  Manual: STM to Rt UT in seated position     05/07/22 Seated:  scapular and cervical retraction combined 10X  Rt cervical  side bending 10X  UE lifting with 2# wt in good posturing 10X  Cervical isometric SB and extension 10X3" holds            W back 2# x10 Standing:             Postural exercises:  green t-band  scapular retraction, rows, shoulder extension x 10            UE flexion against wall  UE arch facing wall, scap squeeze 10X5"  Corner stretch 4X20"  Medial nerve glide 10X5" Manual : STM to rt UT in supine position, suboccipital release, trial of tx but pt states this increases tingling in his arm   05/05/22 Seated:  scapular retraction 10X  Rt cervical side bending 10X each side  Cervical retraction 10X  Bil shoulder ER with GTB 10X  UE flexion in good posturing 10X  Cervical isometric SB and extension 10X3" holds Standing: UE flexion against wall  UE arch facing wall, scap squeeze 10X5"  Corner stretch 4X20"  Medial nerve glide 10X5" Manual : STM to rt UT in seated position  6/15:  evaluation:  HEP,posture education    PATIENT EDUCATION:  Education details: HEP,posture  Person educated: Patient Education method: Explanation Education comprehension: returned demonstration   HOME EXERCISE PROGRAM: [id number] Sitting:  Rt cervical SB x10              Scapular retraction x 10              Cervical retraction x 10               B shoulder ER with green theraband x 10   ASSESSMENT:  CLINICAL IMPRESSION: Focus remains on improving cervical stability, posture and reducing pain as pt has radicular sx with both extension and flexion as well as manual in thoracic area.    PT will continue to  benefit from skilled therapy to progress cervical stabilization.  OBJECTIVE IMPAIRMENTS decreased strength, increased muscle spasms, postural dysfunction, and pain.   ACTIVITY LIMITATIONS sleeping and yard work  PARTICIPATION LIMITATIONS: yard work  PERSONAL FACTORS Time since onset of injury/illness/exacerbation and 1 comorbidity: Previous Rt shoulder surgery  are also affecting patient's  functional outcome.   REHAB POTENTIAL: Good  CLINICAL DECISION MAKING: Stable/uncomplicated  EVALUATION COMPLEXITY: Low   GOALS: Goals reviewed with patient? Yes  SHORT TERM GOALS: Target date: 06/17/2022   PT to be I in HEP to decrease pain to no greater than a 5/10  Baseline:  Goal status: IN PROGRESS  2.  PT radicular sx to go to Rt elbow only to demonstrate decreased nerve irritation. Baseline:  Goal status: IN PROGRESS  3.  PT to be more aware of proper sitting posture. Baseline:  Goal status: IN PROGRESS   LONG TERM GOALS: Target date: 07/08/2022  Pt to be I in advanced HEP to decrease cervical and shoulder pain to no greater than a 2/10 Baseline:  Goal status: IN PROGRESS  2.  PT to have no radicular sx for the past week  Baseline:  Goal status: IN PROGRESS  3.  Pt strength in Rt UE to have increased 1/2 grade to improve functional use.  Baseline:  Goal status: IN PROGRESS  4.  PT mm spasm in Rt upper trap to be minimal  Baseline:  Goal status: IN PROGRESS   PLAN: PT FREQUENCY: 2x/week  PT DURATION: 6 weeks  PLANNED INTERVENTIONS: Therapeutic exercises, Therapeutic activity, Patient/Family education, traction and Manual therapy  PLAN FOR NEXT SESSION: Progress cervical stability exercises. Manual to help reduce spasm and pain.   Virgina Organ, PT CLT [phone]  [facility] [place] Ph: [phone] 05/27/2022, 10:34 AM

## 2022-05-29 ENCOUNTER — Ambulatory Visit (HOSPITAL_COMMUNITY): Payer: Medicare PPO | Admitting: Physical Therapy

## 2022-05-29 DIAGNOSIS — M5412 Radiculopathy, cervical region: Secondary | ICD-10-CM

## 2022-05-29 NOTE — Therapy (Addendum)
OUTPATIENT PHYSICAL THERAPY TREATMENT    Patient Name: Mark [name] MRN: [id number] DOB:1951/09/02, 71 y.o., male Today's Date: 05/29/2022   PT End of Session - 05/29/22 1030     Visit Number 9    Number of Visits 12    Date for PT Re-Evaluation 06/12/22    Authorization Type [employer] approved 12 visits 6/15-7/27    Authorization - Visit Number 9    Authorization - Number of Visits 12    Progress Note Due on Visit 10    PT Start Time 0949    PT Stop Time 1030    PT Time Calculation (min) 41 min    Activity Tolerance Patient tolerated treatment well    Behavior [facility] for tasks assessed/performed                Past Medical History:  Diagnosis Date   Abnormal nuclear stress test false positive 05/2018   BPH (benign prostatic hyperplasia) 04/21/2019   Chest pain stable CAD with cath 06/07/18 possible GI 06/03/2018   Coronary artery disease    a. s/p CABG 2005. b. cath 05/2018 - patent LIMA-LAD, moderate LM and prox OM disease with neg FFR.   Dyslipidemia    Esophageal stricture    ESOPHAGEAL STRICTURE 12/18/2008   Qualifier: History of  By: Candice CampNelson-Smith CMA (AAMA), Dottie     ESOPHAGITIS 12/18/2008   Qualifier: History of  By: Candice CampNelson-Smith CMA (AAMA), Dottie     Essential hypertension, benign 04/06/2013   Gastrointestinal bleeding    GERD (gastroesophageal reflux disease)    History of colonic polyps 08/26/2014   [facility] [place] in May of [place]. They recommend repeat colonoscopy May of 2017 do to inadequate prep, pathology showed hyperplastic polyp    Hyperlipidemia    Hypothyroidism    Insomnia 04/21/2019   Obstructive sleep apnea 07/11/2013   Has mixed apnea, CPAP setting 7    OSA (obstructive sleep apnea)    RECTAL BLEEDING 12/18/2008   Qualifier: Diagnosis of  By: Candice CampNelson-Smith CMA (AAMA), Dottie     Urinary retention    Vitiligo 07/11/2013   Past Surgical History:  Procedure Laterality Date    APPENDECTOMY     bilateral inguinal hernia repair     CARDIAC CATHETERIZATION  12/2005, 06/2005   COLONOSCOPY     CORONARY ARTERY BYPASS GRAFT  2006   LIMA to LAD, off pump   INTRAVASCULAR PRESSURE WIRE/FFR STUDY N/A 06/07/2018   Procedure: INTRAVASCULAR PRESSURE WIRE/FFR STUDY;  Surgeon: Marykay LexHarding, David W, MD;  Location: [facility] IN[facility]ASIVE [facility];  Service: Cardiovascular;  Laterality: N/A;   LEFT HEART CATH AND CORS/GRAFTS ANGIOGRAPHY N/A 06/07/2018   Procedure: LEFT HEART CATH AND CORS/GRAFTS ANGIOGRAPHY;  Surgeon: Marykay LexHarding, David W, MD;  [facility];  Service: Cardiovascular;  Laterality: N/A;   OPEN REDUCTION INTERNAL FIXATION (ORIF) HAND Left 07/21/2015   Procedure: IRRIGATION AND DEBRIDMENT LEFT INDEX AND MIDDLE FINGER, OPEN REDUCTION INTERNAL FIXATION LEFT INDEX FINGER.;  Surgeon: Dominica SeverinWilliam Gramig, MD;  Location: [facility] OR;  Service: Orthopedics;  Laterality: Left;   Patient Active Problem List   Diagnosis Date Noted   Calculus of gallbladder without cholecystitis without obstruction 02/20/2021   Fatty liver 02/20/2021   Insomnia 04/21/2019   BPH (benign prostatic hyperplasia) 04/21/2019   Angina pectoris (HCC)    Abnormal nuclear stress test false positive    Chest pain stable CAD with cath 06/07/18 possible GI 06/03/2018   History of colonic  polyps 08/26/2014   Obstructive sleep apnea 07/11/2013   Vitiligo 07/11/2013   Essential hypertension, benign 04/06/2013   CORONARY ATHEROSCLEROSIS NATIVE CORONARY ARTERY 11/20/2009   Hypothyroidism 12/18/2008   Hyperlipidemia 12/18/2008   ESOPHAGITIS 12/18/2008   ESOPHAGEAL STRICTURE 12/18/2008   RECTAL BLEEDING 12/18/2008  PCP: Babs Sciara, MD  REFERRING PROVIDER: Babs Sciara, MD  REFERRING DIAG:   G54.2 (ICD-10-CM) - Cervical nerve root impingement   THERAPY DIAG:  Cervical radiculopathy  Rationale for Evaluation and Treatment Rehabilitation  ONSET DATE: acute exacerbation 01/29/2022  SUBJECTIVE:                                                                                                                                                                                                          SUBJECTIVE STATEMENT:   MRI is scheduled on 7/24. He is having radicular sx with just about all arm motion. He wakes up to go to the restroom but then the pain in his neck won't let him get back to sleep   PERTINENT HISTORY:  ORIF RT UE; cardiac cath, Rt shoulder surgery to repair a ligament 12 years ago.   PAIN:2 Are you having pain? Yes: NPRS scale: 4/10 Pain location: posterior cervical area and Rt shoulder 2-3 Pain description: intermittent pain  Aggravating factors: using vibrating tools, positioning Relieving factors: recliner with a pillow under his neck    PATIENT GOALS less pain and radicular sx   OBJECTIVE:   DIAGNOSTIC FINDINGS:  IMPRESSION: Multilevel degenerative disc and endplate changes with neuroforaminal narrowing greatest at the bilateral C3-4 and C4-5 levels.   POSTURE: rounded shoulders   PALPATION: Moderate mm spasm in upper trap.   CERVICAL ROM:   Active ROM A/PROM (deg) eval  Flexion   Extension WNL  pt head slightly SB to rt:  reps increases tingling.    Right lateral flexion WNL reps makes no change.   Left lateral flexion WNL reps increases pain down arm   Right rotation   Left rotation    (Blank rows = not tested)  Rt cervical SB isometric:  3+/5 LT cervical SB isometric: 3+/5  Extension:  3+/5   UPPER EXTREMITY MMT:  MMT Right eval Left eval  Shoulder flexion 4/5 5/5  Shoulder extension    Shoulder abduction 4+/5 5/5  Shoulder adduction    Shoulder extension    Shoulder internal rotation 4/5 4+/5  Shoulder external rotation 3/5 3+/5  Middle trapezius    Lower trapezius    Elbow flexion 4+/5 4+/5  Elbow extension 4+/5 5/5  Wrist  flexion 5/5 5/5  Wrist extension 5/5 5/5  Wrist ulnar deviation    Wrist radial deviation    Wrist pronation    Wrist  supination    Grip strength 52# each rep had decreased grip strength.  57# each rep had decreased grip strength   (Blank rows = not tested)   PATIENT SURVEYS:  FOTO 64  TODAY'S TREATMENT:  05/29/22: Standing: Green theraband:   postural exercises: scapular retraction, rows and shoulder extension x 10 Lifting into cabinets keeping cervical stabilized. Kleenex box x 5; 3# x 10  Radial nerve glide x 5; nerve flossing x 5 Sitting: Trap stretch 3 x 30" X to v x 10  Manual to upper trap area.  Manual completed separate from all other aspects of treatment.               05/27/22:            Supine:   Manual to include traction which was stopped due to increase radicular sx. Joint mobs and efflurage to decrease mm spasm.              Isometrics all x 10 5" ; pt states that cervical retraction increases radicular sx, manual traction increases radicular sx, manual at t4 increases radicular sx.             Scapular retraction x 15             Cervical excursion x 3             Thoracic excursion x 3              Retro shoulder circles with no cervical motion x 10             Red tband  Rt shoulder flexion x 10; then LT x 10            B ER with red theraband x 10             Wall arch x 5             Y-lift off x 5.   05/23/22 UBE 4 minutes backward level 2 Standing:  BTB Rows 2X10 BTB extensions 2X10 BTB ER/IR bilaterally 10X Wall push ups 10X2 Lateral raise 2X10 3# DB UE flexion 2X10 3# DB UE press ups 3# 2X10 Manual: STM to Rt UT in seated position     05/21/22    UBE 4 minutes backward level 1 Standing:  GTB scapular retraction 2X10 GTB Rows 2X10 GTB extensions 2X10 Wall push ups 10X2 Lateral raise 2X10 2#DB UE flexion 2X10 2# DB WBack 2#DB 2X10  UE press ups 2# 2X10 Manual: STM to Rt UT in seated position     05/14/22    UBE 4 minutes backward level 1 Standing:  GTB scapular retraction 2X10 GTB Rows 2X10 GTB extensions 2X10 Wall push ups 10X2 Seated: 3D Cervical  excursions 10X each Lateral raise 2X10 2#DB UE flexion 2X10 2# DB WBack 2#DB 2X10  Manual: STM to Rt UT in seated position       05/12/22    UBE 4 minutes backward level 1 Standing:  RTB scapular retraction 2X10 RTB Rows 2X10 RTB extensions 2X10 Wall push ups 10X Seated: 3D Cervical excursions 10X each Lateral raise 2X10 2#DB UE flexion 2X10 2# DB WBack 2#DB 2X10  Manual: STM to Rt UT in seated position     05/07/22 Seated:  scapular and cervical retraction combined 10X  Rt  cervical side bending 10X  UE lifting with 2# wt in good posturing 10X  Cervical isometric SB and extension 10X3" holds            W back 2# x10 Standing:             Postural exercises:  green t-band  scapular retraction, rows, shoulder extension x 10            UE flexion against wall  UE arch facing wall, scap squeeze 10X5"  Corner stretch 4X20"  Medial nerve glide 10X5" Manual : STM to rt UT in supine position, suboccipital release, trial of tx but pt states this increases tingling in his arm   05/05/22 Seated:  scapular retraction 10X  Rt cervical side bending 10X each side  Cervical retraction 10X  Bil shoulder ER with GTB 10X  UE flexion in good posturing 10X  Cervical isometric SB and extension 10X3" holds Standing: UE flexion against wall  UE arch facing wall, scap squeeze 10X5"  Corner stretch 4X20"  Medial nerve glide 10X5" Manual : STM to rt UT in seated position  6/15:  evaluation:  HEP,posture education    PATIENT EDUCATION:  Education details: HEP,posture  Person educated: Patient Education method: Explanation Education comprehension: returned demonstration   HOME EXERCISE PROGRAM: [id number] Sitting:  Rt cervical SB x10              Scapular retraction x 10              Cervical retraction x 10               B shoulder ER with green theraband x 10   ASSESSMENT:  CLINICAL IMPRESSION: Pt has extremely tight upper trap. Mm.  Pt continues to need constant cuing for proper  posture. Therapy focused on nerve glides, trap stretching, stabilization and manual to decrease radicular sx.  Pt will continue to benefit from skilled PT to address these deficits.   OBJECTIVE IMPAIRMENTS decreased strength, increased muscle spasms, postural dysfunction, and pain.   ACTIVITY LIMITATIONS sleeping and yard work  PARTICIPATION LIMITATIONS: yard work  PERSONAL FACTORS Time since onset of injury/illness/exacerbation and 1 comorbidity: Previous Rt shoulder surgery  are also affecting patient's functional outcome.   REHAB POTENTIAL: Good  CLINICAL DECISION MAKING: Stable/uncomplicated  EVALUATION COMPLEXITY: Low   GOALS: Goals reviewed with patient? Yes  SHORT TERM GOALS: Target date: 06/19/2022   PT to be I in HEP to decrease pain to no greater than a 5/10  Baseline:  Goal status: IN PROGRESS  2.  PT radicular sx to go to Rt elbow only to demonstrate decreased nerve irritation. Baseline:  Goal status: IN PROGRESS  3.  PT to be more aware of proper sitting posture. Baseline:  Goal status: IN PROGRESS   LONG TERM GOALS: Target date: 07/10/2022  Pt to be I in advanced HEP to decrease cervical and shoulder pain to no greater than a 2/10 Baseline:  Goal status: IN PROGRESS  2.  PT to have no radicular sx for the past week  Baseline:  Goal status: IN PROGRESS  3.  Pt strength in Rt UE to have increased 1/2 grade to improve functional use.  Baseline:  Goal status: IN PROGRESS  4.  PT mm spasm in Rt upper trap to be minimal  Baseline:  Goal status: IN PROGRESS   PLAN: PT FREQUENCY: 2x/week  PT DURATION: 6 weeks  PLANNED INTERVENTIONS: Therapeutic exercises, Therapeutic activity, Patient/Family education, traction and Manual  therapy  PLAN FOR NEXT SESSION: reassess next treatment.  Virgina Organ, PT CLT [phone]  [facility] [place] Ph: [phone] 05/29/2022, 10:34 AM

## 2022-06-02 ENCOUNTER — Ambulatory Visit (HOSPITAL_COMMUNITY): Payer: Medicare PPO | Admitting: Physical Therapy

## 2022-06-02 DIAGNOSIS — M5412 Radiculopathy, cervical region: Secondary | ICD-10-CM

## 2022-06-02 NOTE — Therapy (Signed)
OUTPATIENT PHYSICAL THERAPY TREATMENT    Patient Name: Mark [name] MRN: [id number] DOB:05-19-1951, 71 y.o., male Today's Date: 06/02/2022 PHYSICAL THERAPY DISCHARGE SUMMARY  Visits from Start of Care: 10  Current functional level related to goals / functional outcomes: See below    Remaining deficits: See below    Education / Equipment: Posture as well as cervical stability    Patient agrees to discharge. Patient goals were not met. Patient is being discharged due to lack of progress.   PT End of Session - 06/02/22 1028     Visit Number 10   Number of Visits 12    Date for PT Re-Evaluation 06/12/22    Authorization Type [employer] approved 12 visits 6/15-7/27    Authorization - Visit Number 10   Authorization - Number of Visits 12    Progress Note Due on Visit 10    PT Start Time 0948   PT Stop Time 1028   PT Time Calculation (min) 41 min    Activity Tolerance Patient tolerated treatment well    Behavior During Therapy [facility] for tasks assessed/performed                Past Medical History:  Diagnosis Date   Abnormal nuclear stress test false positive 05/2018   BPH (benign prostatic hyperplasia) 04/21/2019   Chest pain stable CAD with cath 06/07/18 possible GI 06/03/2018   Coronary artery disease    a. s/p CABG 2005. b. cath 05/2018 - patent LIMA-LAD, moderate LM and prox OM disease with neg FFR.   Dyslipidemia    Esophageal stricture    ESOPHAGEAL STRICTURE 12/18/2008   Qualifier: History of  By: Harlon Ditty CMA (AAMA), Dottie     ESOPHAGITIS 12/18/2008   Qualifier: History of  By: Harlon Ditty CMA (AAMA), Dottie     Essential hypertension, benign 04/06/2013   Gastrointestinal bleeding    GERD (gastroesophageal reflux disease)    History of colonic polyps 08/26/2014   [facility] [place] during colonoscopy in May of 2015. They recommend repeat colonoscopy May of 2017 do to inadequate prep, pathology showed hyperplastic polyp     Hyperlipidemia    Hypothyroidism    Insomnia 04/21/2019   Obstructive sleep apnea 07/11/2013   Has mixed apnea, CPAP setting 7    OSA (obstructive sleep apnea)    RECTAL BLEEDING 12/18/2008   Qualifier: Diagnosis of  By: Harlon Ditty CMA (AAMA), Dottie     Urinary retention    Vitiligo 07/11/2013   Past Surgical History:  Procedure Laterality Date   APPENDECTOMY     bilateral inguinal hernia repair     CARDIAC CATHETERIZATION  12/2005, 06/2005   COLONOSCOPY     CORONARY ARTERY BYPASS GRAFT  2006   LIMA to LAD, off pump   INTRAVASCULAR PRESSURE WIRE/FFR STUDY N/A 06/07/2018   Procedure: INTRAVASCULAR PRESSURE WIRE/FFR STUDY;  Surgeon: Leonie Man, MD;  Location: [place] CV LAB;  Service: Cardiovascular;  Laterality: N/A;   LEFT HEART CATH AND CORS/GRAFTS ANGIOGRAPHY N/A 06/07/2018   Procedure: LEFT HEART CATH AND CORS/GRAFTS ANGIOGRAPHY;  Surgeon: Leonie Man, MD;  Location: [place] CV LAB;  Service: Cardiovascular;  Laterality: N/A;   OPEN REDUCTION INTERNAL FIXATION (ORIF) HAND Left 07/21/2015   Procedure: IRRIGATION AND DEBRIDMENT LEFT INDEX AND MIDDLE FINGER, OPEN REDUCTION INTERNAL FIXATION LEFT INDEX FINGER.;  Surgeon: Roseanne Kaufman, MD;  Location: [place];  Service: Orthopedics;  Laterality: Left;   Patient Active Problem List   Diagnosis Date  Noted   Calculus of gallbladder without cholecystitis without obstruction 02/20/2021   Fatty liver 02/20/2021   Insomnia 04/21/2019   BPH (benign prostatic hyperplasia) 04/21/2019   Angina pectoris ([place])    Abnormal nuclear stress test false positive    Chest pain stable CAD with cath 06/07/18 possible GI 06/03/2018   History of colonic polyps 08/26/2014   Obstructive sleep apnea 07/11/2013   Vitiligo 07/11/2013   Essential hypertension, benign 04/06/2013   CORONARY ATHEROSCLEROSIS NATIVE CORONARY ARTERY 11/20/2009   Hypothyroidism 12/18/2008   Hyperlipidemia 12/18/2008   ESOPHAGITIS 12/18/2008   ESOPHAGEAL STRICTURE  12/18/2008   RECTAL BLEEDING 12/18/2008  PCP: [name], MD  REFERRING PROVIDER: [name], MD  REFERRING DIAG:   G54.2 (ICD-10-CM) - Cervical nerve root impingement   THERAPY DIAG:  Cervical radiculopathy  Rationale for Evaluation and Treatment Rehabilitation  ONSET DATE: acute exacerbation 01/29/2022  SUBJECTIVE:                                                                                                                                                                                                         SUBJECTIVE STATEMENT:   MRI is scheduled on 7/24. Pt states that he went camping.  He went tubing behind a boat which didn't do his neck any good.   PERTINENT HISTORY:  ORIF RT UE; cardiac cath, Rt shoulder surgery to repair a ligament 12 years ago.   PAIN:2 Are you having pain? Yes: NPRS scale: 7/10 radiating to the shoulder  Pain location: posterior cervical area and Rt shoulder 2-3 Pain description: intermittent pain  Aggravating factors: using vibrating tools, positioning Relieving factors: recliner with a pillow under his neck    PATIENT GOALS less pain and radicular sx   OBJECTIVE:   DIAGNOSTIC FINDINGS:  IMPRESSION: Multilevel degenerative disc and endplate changes with neuroforaminal narrowing greatest at the bilateral C3-4 and C4-5 levels.   POSTURE: rounded shoulders   PALPATION: Moderate mm spasm in upper trap.   CERVICAL ROM:   Active ROM A/PROM (deg) eval AROM  Flexion    Extension WNL  pt head slightly SB to rt:  reps increases tingling.     Right lateral flexion WNL reps makes no change.  Wnl   Left lateral flexion WNL reps increases pain down arm  Wnl  Increase pain in neck only today.    Right rotation  70  Left rotation  70 with radicular sx    (Blank rows = not tested)  Rt cervical SB isometric:  3+/5 eval;      7/17:  4+/5 LT cervical SB isometric: 3+/5  eval;     7/17:  4+/5 Extension:  3+/5 eval;                             7/17:  4+/5    UPPER EXTREMITY MMT:  MMT Right eval Right  7/17 Left eval Left  7/17  Shoulder flexion 4/5 4/5 5/5   Shoulder extension      Shoulder abduction 4+/5 5/5 5/5   Shoulder adduction      Shoulder extension      Shoulder internal rotation 4/5 4+/5  4+/5 5/5  Shoulder external rotation 3/5 4-/5  3+/5 4-/5  Middle trapezius      Lower trapezius      Elbow flexion 4+/5 4+ 4+/5 6  Elbow extension 4+/5 5 5/5   Wrist flexion 5/5  5/5   Wrist extension 5/5  5/5   Wrist ulnar deviation      Wrist radial deviation      Wrist pronation      Wrist supination      Grip strength 52# each rep had decreased grip strength.  54# 57# each rep had decreased grip strength 82#    (Blank rows = not tested)   PATIENT SURVEYS:  FOTO 64  current 59   TODAY'S TREATMENT:  7/17:  sitting: Cervical isometric exercise Sitting shoulder ER Bilaterally x 15  Scapular retraction with cervical retraction x 10  05/29/22: Standing: Green theraband:   postural exercises: scapular retraction, rows and shoulder extension x 10 Lifting into cabinets keeping cervical stabilized. Kleenex box x 5; 3# x 10  Radial nerve glide x 5; nerve flossing x 5 Sitting: Trap stretch 3 x 30" X to v x 10  Manual to upper trap area.  Manual completed separate from all other aspects of treatment.               05/27/22:            Supine:   Manual to include traction which was stopped due to increase radicular sx. Joint mobs and efflurage to decrease mm spasm.              Isometrics all x 10 5" ; pt states that cervical retraction increases radicular sx, manual traction increases radicular sx, manual at t4 increases radicular sx.             Scapular retraction x 15             Cervical excursion x 3             Thoracic excursion x 3              Retro shoulder circles with no cervical motion x 10             Red tband  Rt shoulder flexion x 10; then LT x 10            B ER with red theraband x 10              Wall arch x 5             Y-lift off x 5.   05/23/22 UBE 4 minutes backward level 2 Standing:  BTB Rows 2X10 BTB extensions 2X10 BTB ER/IR bilaterally 10X Wall push ups 10X2 Lateral raise 2X10 3# DB UE flexion 2X10 3# DB UE press ups 3# 2X10 Manual: STM to Rt UT  in seated position   PATIENT EDUCATION:  Education details: HEP,posture  Person educated: Patient Education method: Explanation Education comprehension: returned demonstration   HOME EXERCISE PROGRAM: [id number] Sitting:  Rt cervical SB x10              Scapular retraction x 10              Cervical retraction x 10               B shoulder ER with green theraband x 10   ASSESSMENT:  CLINICAL IMPRESSION:   Pt reassessed with no goals achieved.  Pt continues to need verbal cuing on proper posture and cervical stability.  Minimal progress made with MRI scheduled for Thursday.  Cervical flexion, extension, traction all causes increased sx.  We will discharge at this time due to limited progress.   OBJECTIVE IMPAIRMENTS decreased strength, increased muscle spasms, postural dysfunction, and pain.   ACTIVITY LIMITATIONS sleeping and yard work  PARTICIPATION LIMITATIONS: yard work  PERSONAL FACTORS Time since onset of injury/illness/exacerbation and 1 comorbidity: Previous Rt shoulder surgery  are also affecting patient's functional outcome.   REHAB POTENTIAL: Good  CLINICAL DECISION MAKING: Stable/uncomplicated  EVALUATION COMPLEXITY: Low   GOALS: Goals reviewed with patient? Yes  SHORT TERM GOALS: Target date: 06/23/2022   PT to be I in HEP to decrease pain to no greater than a 5/10  Baseline: pain increased to 7/10 today but he went tubing behind a boat this weekend.  Goal status: IN PROGRESS  2.  PT radicular sx to go to Rt elbow only to demonstrate decreased nerve irritation.  Baseline: 7/17 continues to go to the thumb  Goal status: IN PROGRESS  3.  PT to be more aware of proper sitting  posture. Baseline: constantly remind pt but he continues to have poor posture.  Goal status: IN PROGRESS   LONG TERM GOALS: Target date: 07/14/2022  Pt to be I in advanced HEP to decrease cervical and shoulder pain to no greater than a 2/10 Baseline:  Goal status: IN PROGRESS  2.  PT to have no radicular sx for the past week  Baseline:  Goal status: IN PROGRESS  3.  Pt strength in Rt UE to have increased 1/2 grade to improve functional use.  Baseline:  Goal status: IN PROGRESS  4.  PT mm spasm in Rt upper trap to be minimal  Baseline:  Goal status: IN PROGRESS   PLAN: PT FREQUENCY: 2x/week  PT DURATION: 6 weeks  PLANNED INTERVENTIONS: Therapeutic exercises, Therapeutic activity, Patient/Family education, traction and Manual therapy  PLAN FOR NEXT SESSION: discharge due to pt not improving significantly and therapist educating on posture and cervical stability.  Rayetta Humphrey, PT CLT [place] Ph: [phone] 06/02/2022, 10:28 AM

## 2022-06-04 ENCOUNTER — Encounter (HOSPITAL_COMMUNITY): Payer: Medicare PPO | Admitting: Physical Therapy

## 2022-06-06 NOTE — Telephone Encounter (Signed)
Nurses Gabapentin is an option that helps tone down nerve related pain from pinched nerves It is a safer long-term option  As for hydrocodone for pain medicine ideally do not want to utilize this on a regular basis or as a chronic medication from a safety perspective but can be utilized as a short-term medicine to help if patient so desires  I will be out of office till Monday morning thank you

## 2022-06-09 ENCOUNTER — Ambulatory Visit (HOSPITAL_COMMUNITY)
Admission: RE | Admit: 2022-06-09 | Discharge: 2022-06-09 | Disposition: A | Discharge status: Home / Self Care | Payer: Medicare PPO | Source: Ambulatory Visit | Attending: Family Medicine | Admitting: Family Medicine

## 2022-06-09 DIAGNOSIS — M4802 Spinal stenosis, cervical region: Secondary | ICD-10-CM | POA: Diagnosis not present

## 2022-06-09 DIAGNOSIS — M47812 Spondylosis without myelopathy or radiculopathy, cervical region: Secondary | ICD-10-CM | POA: Diagnosis not present

## 2022-06-09 DIAGNOSIS — G542 Cervical root disorders, not elsewhere classified: Secondary | ICD-10-CM | POA: Diagnosis not present

## 2022-06-09 DIAGNOSIS — M50221 Other cervical disc displacement at C4-C5 level: Secondary | ICD-10-CM | POA: Diagnosis not present

## 2022-06-09 DIAGNOSIS — M4312 Spondylolisthesis, cervical region: Secondary | ICD-10-CM | POA: Diagnosis not present

## 2022-06-10 ENCOUNTER — Ambulatory Visit: Payer: Medicare PPO | Admitting: Family Medicine

## 2022-06-10 ENCOUNTER — Encounter (HOSPITAL_COMMUNITY): Payer: Medicare PPO | Admitting: Physical Therapy

## 2022-06-10 NOTE — Addendum Note (Signed)
Addended by: Margaretha Sheffield on: 06/10/2022 10:45 AM   Modules accepted: Orders

## 2022-06-12 ENCOUNTER — Encounter (HOSPITAL_COMMUNITY): Payer: Medicare PPO | Admitting: Physical Therapy

## 2022-06-16 ENCOUNTER — Encounter (HOSPITAL_COMMUNITY): Payer: Medicare PPO | Admitting: Physical Therapy

## 2022-06-18 ENCOUNTER — Encounter (HOSPITAL_COMMUNITY): Payer: Medicare PPO | Admitting: Physical Therapy

## 2022-06-26 ENCOUNTER — Telehealth: Payer: Self-pay | Admitting: *Deleted

## 2022-06-26 ENCOUNTER — Encounter: Payer: Self-pay | Admitting: *Deleted

## 2022-06-26 ENCOUNTER — Ambulatory Visit: Payer: Medicare PPO | Admitting: Surgery

## 2022-06-26 VITALS — BP 146/82 | HR 63 | Temp 97.8°F | Resp 12 | Ht 71.0 in | Wt 204.0 lb

## 2022-06-26 DIAGNOSIS — M5412 Radiculopathy, cervical region: Secondary | ICD-10-CM | POA: Diagnosis not present

## 2022-06-26 DIAGNOSIS — K8020 Calculus of gallbladder without cholecystitis without obstruction: Secondary | ICD-10-CM | POA: Diagnosis not present

## 2022-06-26 DIAGNOSIS — Z6828 Body mass index (BMI) 28.0-28.9, adult: Secondary | ICD-10-CM | POA: Diagnosis not present

## 2022-06-26 DIAGNOSIS — M542 Cervicalgia: Secondary | ICD-10-CM | POA: Diagnosis not present

## 2022-06-26 NOTE — Telephone Encounter (Signed)
   Pre-operative Risk Assessment    Patient Name: Mark [name]  DOB: 06-Jul-1951 MRN: [id number]      Request for Surgical Clearance    Procedure:   LAPAROSCOPIC CHOLECYSTECTOMY   Date of Surgery:  Clearance TBD                                 Surgeon:   Surgeon's Group or Practice Name:  [facility] Phone number:  [phone] Fax number:  [phone]   Type of Clearance Requested:   - Medical  - Pharmacy:  Hold Aspirin NOT INDICATED   Type of Anesthesia:   TO BE DETERMINED   Additional requests/questions:  PT HAS NEW PT APPOINTMENT WITH TESSA CONTE, PA-C, 06/30/22 FOR CLEARANCE.   Signed, Elliot Cousin   06/26/2022, 1:40 PM

## 2022-06-26 NOTE — Telephone Encounter (Signed)
    Patient Name: Mark [name]  DOB: Apr 15, 1951 MRN: [id number]  Primary Cardiologist: Orbie Pyo, MD  Chart reviewed as part of pre-operative protocol coverage.   The patient already has an upcoming appointment scheduled in just a few days on 06/30/22 with Jari Favre PA-C at which time this clearance should be addressed. Had breakthrough angina in the past requiring titration of nitrate.  - Given prior accelerating symptoms before last visit, will route message to Dr. Lynnette Caffey to find out if patient may be able to hold aspirin for lap chole if deemed stable at upcoming visit. Has h/o CAD s/p CABGx1 2005 (LIMA-LAD). Last cath 2019 after abnormal nuc which showed 100% CTO of LAD filling via LIMA, moderate prox LM and prox OM1 with negative FFR, EF with normal wall motion. Dr. Lynnette Caffey - - Please route response to P CV DIV PREOP (the pre-op pool). Thank you.  - I added "preop" comment to appointment notes so that provider is aware to address at time of OV. Per office protocol, the provider seeing this patient should forward their finalized clearance decision to requesting party below.  - Will fax update to requesting surgeon so they are aware. Will remove from preop box as separate preop APP input not necessary at this time.  Laurann Montana, PA-C 06/26/2022, 1:55 PM

## 2022-06-26 NOTE — Progress Notes (Addendum)
[facility] History and Physical  Reason for Referral: Cholelithiasis Referring Physician: Dr. Gerda Diss  Chief Complaint   New Patient (Initial Visit)     Mark [name] is a 71 y.o. male.  HPI: Patient presents for evaluation of cholelithiasis.  About 1 month ago he had his first and only episode of epigastric abdominal pain.  The pain was almost up into his chest, so he took a nitroglycerin, as he was concerned he may have a heart attack.  He started developing the pain in the evening after having a hamburger steak, potatoes, and gravy.  He confirms nausea with an episode of emesis at the time, but since then has denied any further episodes.  He confirms having some constipation recently.  He denies ever having an episode like this prior.  He believes that his pain was secondary to GI upset.  When he followed up with Dr. Gerda Diss recently, it was believed that this was secondary to GI upset, however he did obtain an abdominal ultrasound to rule out any other pathology.  This demonstrated cholelithiasis without signs of acute cholecystitis.  His past medical history is significant for hypothyroidism, hyperlipidemia, and hypertension.  His surgical history is significant for laparoscopic appendectomy and CABG 16 to 17 years ago.  He is no longer on any blood thinning medications.  He last saw his cardiologist in February, who recommended that he follow-up in 6 months.  He saw his primary care doctor, and was complaining of some atypical chest pains.  His primary care doctor recommended evaluation by the cardiologist, to see if he needs another stress test.  His last stress test was in 2019.  He denies use of tobacco products and illicit drugs.  He will occasionally drink alcohol.  Past Medical History:  Diagnosis Date   Abnormal nuclear stress test false positive 05/2018   BPH (benign prostatic hyperplasia) 04/21/2019   Chest pain stable CAD with cath 06/07/18 possible GI 06/03/2018    Coronary artery disease    a. s/p CABG 2005. b. cath 05/2018 - patent LIMA-LAD, moderate LM and prox OM disease with neg FFR.   Dyslipidemia    Esophageal stricture    ESOPHAGEAL STRICTURE 12/18/2008   Qualifier: History of  By: Candice Camp CMA (AAMA), Dottie     ESOPHAGITIS 12/18/2008   Qualifier: History of  By: Candice Camp CMA (AAMA), Dottie     Essential hypertension, benign 04/06/2013   Gastrointestinal bleeding    GERD (gastroesophageal reflux disease)    History of colonic polyps 08/26/2014   [facility] [place] during colonoscopy in May of 2015. They recommend repeat colonoscopy May of 2017 do to inadequate prep, pathology showed hyperplastic polyp    Hyperlipidemia    Hypothyroidism    Insomnia 04/21/2019   Obstructive sleep apnea 07/11/2013   Has mixed apnea, CPAP setting 7    OSA (obstructive sleep apnea)    RECTAL BLEEDING 12/18/2008   Qualifier: Diagnosis of  By: Candice Camp CMA (AAMA), Dottie     Urinary retention    Vitiligo 07/11/2013    Past Surgical History:  Procedure Laterality Date   APPENDECTOMY     bilateral inguinal hernia repair     CARDIAC CATHETERIZATION  12/2005, 06/2005   COLONOSCOPY     CORONARY ARTERY BYPASS GRAFT  2006   LIMA to LAD, off pump   INTRAVASCULAR PRESSURE WIRE/FFR STUDY N/A 06/07/2018   Procedure: INTRAVASCULAR PRESSURE WIRE/FFR STUDY;  Surgeon: Marykay Lex, MD;  Location: [facility] INVASIVE  CV LAB;  Service: Cardiovascular;  Laterality: N/A;   LEFT HEART CATH AND CORS/GRAFTS ANGIOGRAPHY N/A 06/07/2018   Procedure: LEFT HEART CATH AND CORS/GRAFTS ANGIOGRAPHY;  Surgeon: Marykay Lex, MD;  Location: [facility] INVASIVE CV LAB;  Service: Cardiovascular;  Laterality: N/A;   OPEN REDUCTION INTERNAL FIXATION (ORIF) HAND Left 07/21/2015   Procedure: IRRIGATION AND DEBRIDMENT LEFT INDEX AND MIDDLE FINGER, OPEN REDUCTION INTERNAL FIXATION LEFT INDEX FINGER.;  Surgeon: Dominica Severin, MD;  Location: [facility] OR;  Service: Orthopedics;   Laterality: Left;    Family History  Problem Relation Age of Onset   Heart disease Father    Hypertension Father     Social History   Tobacco Use   Smoking status: Former    Types: Cigarettes    Quit date: 07/31/1979    Years since quitting: 42.9   Smokeless tobacco: Former  Substance Use Topics   Alcohol use: Yes    Comment: occassional   Drug use: No    Medications: I have reviewed the patient's current medications. Allergies as of 06/26/2022       Reactions   Meloxicam Rash        Medication List        Accurate as of June 26, 2022 10:09 AM. If you have any questions, ask your nurse or doctor.          albuterol 108 (90 Base) MCG/ACT inhaler Commonly known as: VENTOLIN HFA TAKE 2 PUFFS BY MOUTH EVERY 6 HOURS AS NEEDED FOR WHEEZE OR SHORTNESS OF BREATH   ALPRAZolam 0.25 MG tablet Commonly known as: XANAX Take 1 tablet (0.25 mg total) by mouth at bedtime as needed.   aspirin 81 MG tablet Take 81 mg by mouth every morning.   atorvastatin 80 MG tablet Commonly known as: LIPITOR Take 1 tablet (80 mg total) by mouth daily.   cetirizine 10 MG tablet Commonly known as: ZYRTEC Take 5 mg by mouth daily as needed for allergies.   isosorbide mononitrate 30 MG 24 hr tablet Commonly known as: IMDUR Take 1 tablet (30 mg total) by mouth daily.   levothyroxine 100 MCG tablet Commonly known as: SYNTHROID Take 1 tablet (100 mcg total) by mouth daily.   losartan 50 MG tablet Commonly known as: COZAAR Take 1 tablet (50 mg total) by mouth daily.   metoprolol succinate 25 MG 24 hr tablet Commonly known as: TOPROL-XL Take 0.5 tablets (12.5 mg total) by mouth daily.   naproxen 500 MG tablet Commonly known as: NAPROSYN Take 500 mg by mouth 2 (two) times daily with a meal.   nitroGLYCERIN 0.4 MG SL tablet Commonly known as: NITROSTAT Place 1 tablet (0.4 mg total) under the tongue every 5 (five) minutes as needed for chest pain.   omeprazole 20 MG  capsule Commonly known as: PRILOSEC Take 1 capsule (20 mg total) by mouth daily.   SAW PALMETTO PO Take by mouth.   tamsulosin 0.4 MG Caps capsule Commonly known as: FLOMAX Take 1 capsule (0.4 mg total) by mouth daily.   triamcinolone cream 0.1 % Commonly known as: KENALOG Apply 1 application topically 2 (two) times daily.         ROS:  Constitutional: negative for chills, fatigue, and fevers Eyes: negative for visual disturbance and pain Ears, nose, mouth, throat, and face: negative for ear drainage, sore throat, and sinus problems Respiratory: negative for cough, wheezing, and shortness of breath Cardiovascular: negative for chest pain Gastrointestinal: negative for abdominal pain, nausea, reflux symptoms, and vomiting Genitourinary:positive  for frequency, negative for dysuria and urinary retention Integument/breast: negative for dryness and rash Hematologic/lymphatic: negative for bleeding and lymphadenopathy Musculoskeletal:positive for back pain, neck pain, and joint pain Neurological: positive for numbness, negative for dizziness and tremors Endocrine: negative for temperature intolerance  Blood pressure (!) 146/82, pulse 63, temperature 97.8 F (36.6 C), temperature source Oral, resp. rate 12, height 5\' 11"  (1.803 m), weight 204 lb (92.5 kg), SpO2 95 %. Physical Exam Vitals reviewed.  Constitutional:      Appearance: Normal appearance.  HENT:     Head: Normocephalic and atraumatic.  Eyes:     Extraocular Movements: Extraocular movements intact.     Pupils: Pupils are equal, round, and reactive to light.  Cardiovascular:     Rate and Rhythm: Normal rate and regular rhythm.  Pulmonary:     Effort: Pulmonary effort is normal.     Breath sounds: Normal breath sounds.  Abdominal:     Comments: Abdomen soft, nondistended, no percussion tenderness, nontender to palpation; no rigidity, guarding, rebound tenderness; negative Murphy's  Musculoskeletal:         General: Normal range of motion.     Cervical back: Normal range of motion.  Skin:    General: Skin is warm and dry.  Neurological:     General: No focal deficit present.     Mental Status: He is alert and oriented to person, place, and time.  Psychiatric:        Mood and Affect: Mood normal.        Behavior: Behavior normal.     Results: Abdominal ultrasound (05/22/2022): IMPRESSION: 1.  Cholelithiasis without evidence of acute cholecystitis.   2.  Hepatic steatosis.   Assessment & Plan:  Mark [name] is a 71 y.o. male who presents for evaluation of cholelithiasis.  -I explained the pathophysiology of gallbladder disease, and why we recommend cholecystectomy with a history of symptomatic cholelithiasis.  I did express to the patient that it is unknown whether his gallstones were the cause of his pain versus GI upset. -I counseled the patient about the indication, risks and benefits of laparoscopic cholecystectomy.  He understands there is a very small chance for bleeding, infection, injury to normal structures (including common bile duct), conversion to open surgery, persistent symptoms, evolution of postcholecystectomy diarrhea, need for secondary interventions, anesthesia reaction, cardiopulmonary issues and other risks not specifically detailed here. I described the expected recovery, the plan for follow-up and the restrictions during the recovery phase.  All questions were answered. -I did explain that since he has had no further episodes, it would be reasonable to monitor versus undergoing cholecystectomy -I discussed with the patient that we will have him be evaluated by cardiology, and once he is cleared, he can make a decision as to whether he would like to proceed with surgery at this time vs monitoring for further symptoms -Plan for preoperative evaluation by cardiology given recent episodes of chest pain with a history of CABG -I did advise him to present to the emergency  department if he begins to have fever, chills, worsening epigastric and right upper quadrant abdominal pain, nausea and vomiting -I will plan to call and speak with the patient once he has been evaluated by cardiology  All questions were answered to the satisfaction of the patient.  61, DO [facility] [address] [place], [place] [phone] (office)

## 2022-06-29 NOTE — Progress Notes (Signed)
Office Visit    Patient Name: Mark [name] Date of Encounter: 06/30/2022  PCP:  Babs Sciara, MD   [facility]  Cardiologist:  Orbie Pyo, MD  Advanced Practice Provider:  No care team member to display Electrophysiologist:  None   Chief Complaint    Mark [name] is a 71 y.o. male with past medical history significant for chronic stable angina, hypertension, hyperlipidemia, CAD status post CABG 2005, GERD, esophageal stricture, presents today for preop evaluation.  He was last seen by Dr. Lynnette Caffey 2/23.  At that time his stable anginal symptoms were improving with Imdur.  He was asked to call our office if he was needing to take nitroglycerin on a daily basis.  LDL was at goal.  Plan to see him in 6 months.  Today, he states that he has not had any chest pain.  He had a gallbladder attack in [place].  He did take 3 nitro but they really did not help.  He needs his gallbladder removed hence why he is here for preop evaluation.  He walks 2 miles every other day and does some gardening and fishing.  He has no issues with hills or stairs.  For this reason, he is scored a 6.55 METS on the DASI.  This exceeds the 4 METS minimum requirement.  He will continue with his Imdur 30 mg daily which was started back in February.  He mentioned to me that he has a pinched nerve in his neck and on 31 August plans to have an injection.   Reports no shortness of breath nor dyspnea on exertion. Reports no chest pain, pressure, or tightness. No edema, orthopnea, PND. Reports no palpitations.    Past Medical History    Past Medical History:  Diagnosis Date   Abnormal nuclear stress test false positive 05/2018   BPH (benign prostatic hyperplasia) 04/21/2019   Chest pain stable CAD with cath 06/07/18 possible GI 06/03/2018   Coronary artery disease    a. s/p CABG 2005. b. cath 05/2018 - patent LIMA-LAD, moderate LM and  prox OM disease with neg FFR.   Dyslipidemia    Esophageal stricture    ESOPHAGEAL STRICTURE 12/18/2008   Qualifier: History of  By: Candice Camp CMA (AAMA), Dottie     ESOPHAGITIS 12/18/2008   Qualifier: History of  By: Candice Camp CMA (AAMA), Dottie     Essential hypertension, benign 04/06/2013   Gastrointestinal bleeding    GERD (gastroesophageal reflux disease)    History of colonic polyps 08/26/2014   [facility] [place] during colonoscopy in May of 2015. They recommend repeat colonoscopy May of 2017 do to inadequate prep, pathology showed hyperplastic polyp    Hyperlipidemia    Hypothyroidism    Insomnia 04/21/2019   Obstructive sleep apnea 07/11/2013   Has mixed apnea, CPAP setting 7    OSA (obstructive sleep apnea)    RECTAL BLEEDING 12/18/2008   Qualifier: Diagnosis of  By: Candice Camp CMA (AAMA), Dottie     Urinary retention    Vitiligo 07/11/2013   Past Surgical History:  Procedure Laterality Date   APPENDECTOMY     bilateral inguinal hernia repair     CARDIAC CATHETERIZATION  12/2005, 06/2005   COLONOSCOPY     CORONARY ARTERY BYPASS GRAFT  2006   LIMA to LAD, off pump   INTRAVASCULAR PRESSURE WIRE/FFR STUDY N/A 06/07/2018   Procedure:  INTRAVASCULAR PRESSURE WIRE/FFR STUDY;  Surgeon: Marykay Lex, MD;  Location: [facility] INVASIVE CV LAB;  Service: Cardiovascular;  Laterality: N/A;   LEFT HEART CATH AND CORS/GRAFTS ANGIOGRAPHY N/A 06/07/2018   Procedure: LEFT HEART CATH AND CORS/GRAFTS ANGIOGRAPHY;  Surgeon: Marykay Lex, MD;  Location: [facility] INVASIVE CV LAB;  Service: Cardiovascular;  Laterality: N/A;   OPEN REDUCTION INTERNAL FIXATION (ORIF) HAND Left 07/21/2015   Procedure: IRRIGATION AND DEBRIDMENT LEFT INDEX AND MIDDLE FINGER, OPEN REDUCTION INTERNAL FIXATION LEFT INDEX FINGER.;  Surgeon: Dominica Severin, MD;  Location: [facility] OR;  Service: Orthopedics;  Laterality: Left;    Allergies  Allergies  Allergen Reactions   Meloxicam Rash      EKGs/Labs/Other Studies Reviewed:   The following studies were reviewed today: Left Main  Vessel is large.  Ost LM to Mid LM lesion is 45% stenosed. The lesion is located proximal to the major branch, focal and eccentric. Pressure wire/FFR was performed on the lesion. FFR: 0.89.    Left Anterior Descending  Ost LAD to Prox LAD lesion is 100% stenosed. The lesion is chronically occluded.  Prox LAD to Mid LAD lesion is 35% stenosed. The lesion is located at the bifurcation and eccentric.    First Diagonal Branch  Vessel is moderate in size.    Lateral First Diagonal Branch  Vessel is small in size.    Second Diagonal Branch  Vessel is small in size.    Left Circumflex  Vessel is moderate in size.    First Obtuse Marginal Branch  Vessel is moderate in size.  Ost 1st Mrg lesion is 40% stenosed. The lesion is segmental and eccentric.  1st Mrg lesion is 20% stenosed. The lesion is focal and eccentric.    Second Obtuse Marginal Branch  Vessel is small in size.    Third Obtuse Marginal Branch  Vessel is small in size.    Left Posterior Atrioventricular Artery  Vessel is small in size.    Right Coronary Artery  Ost RCA to Prox RCA lesion is 25% stenosed. The lesion is segmental and eccentric.    Acute Marginal Branch  Vessel is small in size.    Inferior Septal  Vessel is small in size.    Second Right Posterolateral Branch  Vessel is small in size.    LIMA LIMA Graft To Mid LAD  LIMA graft was visualized by angiography and is large. The graft exhibits no disease.    Intervention   No interventions have been documented.   Wall Motion  Resting                Left Heart  Left Ventricle The left ventricular size is normal. The left ventricular systolic function is normal. LV end diastolic pressure is normal. No regional wall motion abnormalities.  Aortic Valve There is no aortic valve stenosis.   Coronary Diagrams  Diagnostic Dominance:  Right  Intervention   EKG:  EKG is not ordered today.  EKG from 05/21/2022 showed sinus bradycardia.  Recent Labs: 05/21/2022: ALT 10; BUN 12; Creatinine, Ser 0.99; Hemoglobin 14.1; Platelets 169; Potassium 4.6; Sodium 137; TSH 2.470  Recent Lipid Panel    Component Value Date/Time   CHOL 107 05/21/2022 0944   TRIG 216 (H) 05/21/2022 0944   HDL 31 (L) 05/21/2022 0944   CHOLHDL 3.5 05/21/2022 0944   CHOLHDL 3.3 10/31/2014 0919   VLDL 40 10/31/2014 0919   LDLCALC 41 05/21/2022 0944    Home Medications   No outpatient medications have  been marked as taking for the 06/30/22 encounter (Office Visit) with Elgie Collard, PA-C.     Review of Systems      All other systems reviewed and are otherwise negative except as noted above.  Physical Exam    VS:  BP 124/70   Pulse (!) 52   Ht 5\' 11"  (1.803 m)   Wt 202 lb (91.6 kg)   SpO2 94%   BMI 28.17 kg/m  , BMI Body mass index is 28.17 kg/m.  Wt Readings from Last 3 Encounters:  06/30/22 202 lb (91.6 kg)  06/26/22 204 lb (92.5 kg)  05/21/22 203 lb 12.8 oz (92.4 kg)     GEN: Well nourished, well developed, in no acute distress. HEENT: normal. Neck: Supple, no JVD, carotid bruits, or masses. Cardiac: RRR, no murmurs, rubs, or gallops. No clubbing, cyanosis, edema.  Radials/PT 2+ and equal bilaterally.  Respiratory:  Respirations regular and unlabored, clear to auscultation bilaterally. GI: Soft, nontender, nondistended. MS: No deformity or atrophy. Skin: Warm and dry, no rash. Neuro:  Strength and sensation are intact. Psych: Normal affect.  Assessment & Plan    Preop eval Mr. Arceneaux perioperative risk of a major cardiac event is 6.6% according to the Revised Cardiac Risk Index (RCRI).  Therefore, he is at high risk for perioperative complications.   His functional capacity is good at 6.55 METs according to the Duke Activity Status Index (DASI). Recommendations: According to ACC/AHA guidelines, no further cardiovascular  testing needed.  The patient may proceed to surgery at acceptable risk.   Antiplatelet and/or Anticoagulation Recommendations: Aspirin can be held for 7 days prior to his surgery.  Please resume Aspirin post operatively when it is felt to be safe from a bleeding standpoint.   2. Chronic stable angina -No recurrent chest pain since Imdur 30 mg daily added -Last time that he took his nitro was due to his gallbladder attack -Continue GDMT: Aspirin 81 mg daily, Lipitor 80 mg daily, metoprolol succinate 25 mg daily, Imdur 30 mg, Cozaar 50 mg daily  3. Hypertension -Well-controlled today -Continue current medication regimen which includes Cozaar 50 mg daily, Imdur 30 mg daily, metoprolol succinate 25 mg daily  4. Hyperlipidemia -Continue Lipitor 80 mg daily -Recent LDL 41          Disposition: Follow up 6 months with Early Osmond, MD or APP.  Signed, Elgie Collard, PA-C 06/30/2022, 12:28 PM [facility]

## 2022-06-30 ENCOUNTER — Ambulatory Visit (INDEPENDENT_AMBULATORY_CARE_PROVIDER_SITE_OTHER): Payer: Medicare PPO | Admitting: Physician Assistant

## 2022-06-30 ENCOUNTER — Other Ambulatory Visit: Payer: Self-pay | Admitting: Family Medicine

## 2022-06-30 ENCOUNTER — Encounter: Payer: Self-pay | Admitting: Physician Assistant

## 2022-06-30 VITALS — BP 124/70 | HR 52 | Ht 71.0 in | Wt 202.0 lb

## 2022-06-30 DIAGNOSIS — I10 Essential (primary) hypertension: Secondary | ICD-10-CM | POA: Diagnosis not present

## 2022-06-30 DIAGNOSIS — E785 Hyperlipidemia, unspecified: Secondary | ICD-10-CM

## 2022-06-30 DIAGNOSIS — Z01810 Encounter for preprocedural cardiovascular examination: Secondary | ICD-10-CM | POA: Diagnosis not present

## 2022-06-30 DIAGNOSIS — I208 Other forms of angina pectoris: Secondary | ICD-10-CM | POA: Diagnosis not present

## 2022-06-30 NOTE — Patient Instructions (Signed)
Medication Instructions:  Your physician recommends that you continue on your current medications as directed. Please refer to the Current Medication list given to you today.  *If you need a refill on your cardiac medications before your next appointment, please call your pharmacy*   Lab Work: None If you have labs (blood work) drawn today and your tests are completely normal, you will receive your results only by: MyChart Message (if you have MyChart) OR A paper copy in the mail If you have any lab test that is abnormal or we need to change your treatment, we will call you to review the results.   Follow-Up: At [facility], you and your health needs are our priority.  As part of our continuing mission to provide you with exceptional heart care, we have created designated Provider Care Teams.  These Care Teams include your primary Cardiologist (physician) and Advanced Practice Providers (APPs -  Physician Assistants and Nurse Practitioners) who all work together to provide you with the care you need, when you need it.  Your next appointment:   6 month(s)  The format for your next appointment:   In Person  Provider:   Orbie Pyo, MD {  Important Information About Sugar

## 2022-07-09 ENCOUNTER — Ambulatory Visit (INDEPENDENT_AMBULATORY_CARE_PROVIDER_SITE_OTHER): Payer: Medicare PPO | Admitting: Surgery

## 2022-07-09 DIAGNOSIS — K8020 Calculus of gallbladder without cholecystitis without obstruction: Secondary | ICD-10-CM

## 2022-07-09 NOTE — Progress Notes (Signed)
Called the patient to inform him that he has been cleared by cardiology for surgery.  He occasionally has some nausea, but denies any emesis or abdominal pain.  At this time, he has decided that he would like to proceed with laparoscopic cholecystectomy.  He is scheduled to have a neck injection on 8/31.  I advised him that the office will call him once we have him scheduled for surgery.  All questions were answered to his expressed satisfaction.  Theophilus Kinds, DO [facility] [address] [place], [place] [address] [phone] (office)

## 2022-07-17 DIAGNOSIS — M5412 Radiculopathy, cervical region: Secondary | ICD-10-CM | POA: Diagnosis not present

## 2022-07-24 ENCOUNTER — Telehealth: Payer: Self-pay | Admitting: *Deleted

## 2022-07-24 ENCOUNTER — Encounter (HOSPITAL_COMMUNITY): Admission: RE | Admit: 2022-07-24 | Payer: Medicare PPO | Source: Ambulatory Visit

## 2022-07-24 NOTE — Telephone Encounter (Signed)
Call placed to patient and patient made aware.   Agreeable to plan.  

## 2022-07-24 NOTE — Telephone Encounter (Signed)
Pre- Op Date: 07/24/2022 Surgical Date: 07/28/2022 Procedure: Lap Chole  Received call from patient [phone]~ telephone.   Patient reports that spouse has tested positive for COVID with home test on 07/23/2022. Wife has cough, body aches, and congestion. Patient is asymptomatic at this time. Both the patient and the wife were exposed to + person as roommate to parent in ALF on Monday, 07/21/2022.  Patient attempted home test on 07/23/2022, but did not receive definitive results.   [facility] COVID Guidelines noted as follows: For patients with COVID exposure (close contact with someone who has tested positive for COVID): Reschedule surgery until day 11 after exposure If exposure was in same household, start counting from date of positive test. If patient develops symptoms: Recommend patient get tested either using home test or community testing. Will accept home test if they report it as positive (can be reported to PCP, Short Stay RN/ PAT RN, Surgeon, etc.). If the home test is negative and symptomatic refer patient to the community for PCR test. Can Google current testing sites in the community.   Surgery and pre op date rescheduled as follows: Pre- Op Date: 08/01/2022~ telephone call Surgical Date: 08/04/2022  Call placed to patient. LMTRC.

## 2022-07-30 ENCOUNTER — Other Ambulatory Visit: Payer: Self-pay

## 2022-07-30 ENCOUNTER — Encounter (HOSPITAL_COMMUNITY): Payer: Self-pay

## 2022-07-31 ENCOUNTER — Encounter (HOSPITAL_COMMUNITY)
Admission: RE | Admit: 2022-07-31 | Discharge: 2022-07-31 | Disposition: A | Discharge status: Home / Self Care | Payer: Medicare PPO | Source: Ambulatory Visit | Attending: Surgery | Admitting: Surgery

## 2022-08-04 ENCOUNTER — Ambulatory Visit (HOSPITAL_BASED_OUTPATIENT_CLINIC_OR_DEPARTMENT_OTHER): Payer: Medicare PPO | Admitting: Anesthesiology

## 2022-08-04 ENCOUNTER — Encounter (HOSPITAL_COMMUNITY)
Admission: RE | Disposition: A | Discharge status: Home / Self Care | Payer: Self-pay | Source: Home / Self Care | Attending: Surgery

## 2022-08-04 ENCOUNTER — Other Ambulatory Visit: Payer: Self-pay

## 2022-08-04 ENCOUNTER — Encounter (HOSPITAL_COMMUNITY): Payer: Self-pay | Admitting: Surgery

## 2022-08-04 ENCOUNTER — Ambulatory Visit (HOSPITAL_COMMUNITY)
Admission: RE | Admit: 2022-08-04 | Discharge: 2022-08-04 | Disposition: A | Discharge status: Home / Self Care | Payer: Medicare PPO | Attending: Surgery | Admitting: Surgery

## 2022-08-04 ENCOUNTER — Ambulatory Visit (HOSPITAL_COMMUNITY): Payer: Medicare PPO | Admitting: Anesthesiology

## 2022-08-04 DIAGNOSIS — Z951 Presence of aortocoronary bypass graft: Secondary | ICD-10-CM | POA: Insufficient documentation

## 2022-08-04 DIAGNOSIS — I10 Essential (primary) hypertension: Secondary | ICD-10-CM | POA: Insufficient documentation

## 2022-08-04 DIAGNOSIS — K8010 Calculus of gallbladder with chronic cholecystitis without obstruction: Secondary | ICD-10-CM | POA: Diagnosis not present

## 2022-08-04 DIAGNOSIS — E039 Hypothyroidism, unspecified: Secondary | ICD-10-CM | POA: Insufficient documentation

## 2022-08-04 DIAGNOSIS — Z9049 Acquired absence of other specified parts of digestive tract: Secondary | ICD-10-CM | POA: Insufficient documentation

## 2022-08-04 DIAGNOSIS — E785 Hyperlipidemia, unspecified: Secondary | ICD-10-CM | POA: Diagnosis not present

## 2022-08-04 DIAGNOSIS — I2510 Atherosclerotic heart disease of native coronary artery without angina pectoris: Secondary | ICD-10-CM | POA: Insufficient documentation

## 2022-08-04 DIAGNOSIS — Z87891 Personal history of nicotine dependence: Secondary | ICD-10-CM | POA: Diagnosis not present

## 2022-08-04 DIAGNOSIS — G4730 Sleep apnea, unspecified: Secondary | ICD-10-CM | POA: Insufficient documentation

## 2022-08-04 DIAGNOSIS — K219 Gastro-esophageal reflux disease without esophagitis: Secondary | ICD-10-CM | POA: Diagnosis not present

## 2022-08-04 DIAGNOSIS — K8020 Calculus of gallbladder without cholecystitis without obstruction: Secondary | ICD-10-CM

## 2022-08-04 HISTORY — PX: CHOLECYSTECTOMY: SHX55

## 2022-08-04 SURGERY — LAPAROSCOPIC CHOLECYSTECTOMY
Anesthesia: General | Site: Abdomen

## 2022-08-04 MED ORDER — BUPIVACAINE HCL (PF) 0.5 % IJ SOLN
INTRAMUSCULAR | Status: AC
  Filled 2022-08-04: qty 30

## 2022-08-04 MED ORDER — ROCURONIUM BROMIDE 10 MG/ML (PF) SYRINGE
PREFILLED_SYRINGE | INTRAVENOUS | Status: AC
  Filled 2022-08-04: qty 20

## 2022-08-04 MED ORDER — ROCURONIUM BROMIDE 10 MG/ML (PF) SYRINGE
PREFILLED_SYRINGE | INTRAVENOUS | Status: AC
  Filled 2022-08-04: qty 10

## 2022-08-04 MED ORDER — FENTANYL CITRATE PF 50 MCG/ML IJ SOSY
25.0000 ug | PREFILLED_SYRINGE | INTRAMUSCULAR | Status: DC | PRN
  Administered 2022-08-04 (×2): 50 ug via INTRAVENOUS
  Filled 2022-08-04: qty 1

## 2022-08-04 MED ORDER — BUPIVACAINE HCL (PF) 0.5 % IJ SOLN
INTRAMUSCULAR | Status: DC | PRN
  Administered 2022-08-04: 20 mL

## 2022-08-04 MED ORDER — SODIUM CHLORIDE 0.9 % IV SOLN
2.0000 g | INTRAVENOUS | Status: AC
  Administered 2022-08-04: 2 g via INTRAVENOUS
  Filled 2022-08-04: qty 2

## 2022-08-04 MED ORDER — FENTANYL CITRATE (PF) 100 MCG/2ML IJ SOLN
INTRAMUSCULAR | Status: DC | PRN
  Administered 2022-08-04 (×5): 50 ug via INTRAVENOUS
  Administered 2022-08-04: 100 ug via INTRAVENOUS

## 2022-08-04 MED ORDER — ONDANSETRON HCL 4 MG/2ML IJ SOLN
INTRAMUSCULAR | Status: AC
  Filled 2022-08-04: qty 2

## 2022-08-04 MED ORDER — LIDOCAINE HCL (CARDIAC) PF 50 MG/5ML IV SOSY
PREFILLED_SYRINGE | INTRAVENOUS | Status: DC | PRN
  Administered 2022-08-04: 60 mg via INTRAVENOUS

## 2022-08-04 MED ORDER — SUGAMMADEX SODIUM 500 MG/5ML IV SOLN
INTRAVENOUS | Status: DC | PRN
  Administered 2022-08-04: 150 mg via INTRAVENOUS

## 2022-08-04 MED ORDER — CHLORHEXIDINE GLUCONATE CLOTH 2 % EX PADS: 6.0000 | MEDICATED_PAD | Freq: Once | CUTANEOUS | Status: DC

## 2022-08-04 MED ORDER — NAPROXEN 500 MG PO TABS: 500.0000 mg | ORAL_TABLET | Freq: Every day | ORAL | Status: DC

## 2022-08-04 MED ORDER — OXYCODONE HCL 5 MG PO TABS: 5.0000 mg | ORAL_TABLET | Freq: Four times a day (QID) | ORAL | 0 refills | Status: AC | PRN

## 2022-08-04 MED ORDER — LIDOCAINE HCL (PF) 2 % IJ SOLN
INTRAMUSCULAR | Status: AC
  Filled 2022-08-04: qty 10

## 2022-08-04 MED ORDER — ASPIRIN 81 MG PO TABS: 81.0000 mg | ORAL_TABLET | Freq: Every morning | ORAL | Status: AC

## 2022-08-04 MED ORDER — CHLORHEXIDINE GLUCONATE 0.12 % MT SOLN
15.0000 mL | Freq: Once | OROMUCOSAL | Status: AC
  Administered 2022-08-04: 15 mL via OROMUCOSAL

## 2022-08-04 MED ORDER — ORAL CARE MOUTH RINSE: 15.0000 mL | Freq: Once | OROMUCOSAL | Status: AC

## 2022-08-04 MED ORDER — ONDANSETRON HCL 4 MG/2ML IJ SOLN
INTRAMUSCULAR | Status: DC | PRN
  Administered 2022-08-04: 4 mg via INTRAVENOUS

## 2022-08-04 MED ORDER — ROCURONIUM BROMIDE 100 MG/10ML IV SOLN
INTRAVENOUS | Status: DC | PRN
  Administered 2022-08-04: 70 mg via INTRAVENOUS

## 2022-08-04 MED ORDER — EPHEDRINE SULFATE (PRESSORS) 50 MG/ML IJ SOLN
INTRAMUSCULAR | Status: DC | PRN
  Administered 2022-08-04: 5 mg via INTRAVENOUS

## 2022-08-04 MED ORDER — FENTANYL CITRATE PF 50 MCG/ML IJ SOSY
PREFILLED_SYRINGE | INTRAMUSCULAR | Status: AC
  Filled 2022-08-04: qty 1

## 2022-08-04 MED ORDER — PROPOFOL 10 MG/ML IV BOLUS
INTRAVENOUS | Status: AC
  Filled 2022-08-04: qty 20

## 2022-08-04 MED ORDER — LACTATED RINGERS IV SOLN: INTRAVENOUS | Status: DC

## 2022-08-04 MED ORDER — FENTANYL CITRATE (PF) 250 MCG/5ML IJ SOLN
INTRAMUSCULAR | Status: AC
  Filled 2022-08-04: qty 5

## 2022-08-04 MED ORDER — FENTANYL CITRATE (PF) 100 MCG/2ML IJ SOLN
INTRAMUSCULAR | Status: AC
  Filled 2022-08-04: qty 2

## 2022-08-04 MED ORDER — HEMOSTATIC AGENTS (NO CHARGE) OPTIME
TOPICAL | Status: DC | PRN
  Administered 2022-08-04 (×2): 1 via TOPICAL

## 2022-08-04 MED ORDER — DEXMEDETOMIDINE HCL IN NACL 80 MCG/20ML IV SOLN
INTRAVENOUS | Status: AC
  Filled 2022-08-04: qty 20

## 2022-08-04 MED ORDER — METOPROLOL TARTRATE 5 MG/5ML IV SOLN
INTRAVENOUS | Status: AC
  Filled 2022-08-04: qty 5

## 2022-08-04 MED ORDER — PROPOFOL 500 MG/50ML IV EMUL
INTRAVENOUS | Status: AC
  Filled 2022-08-04: qty 100

## 2022-08-04 MED ORDER — PROPOFOL 10 MG/ML IV BOLUS
INTRAVENOUS | Status: DC | PRN
  Administered 2022-08-04: 30 mg via INTRAVENOUS
  Administered 2022-08-04: 170 mg via INTRAVENOUS

## 2022-08-04 MED ORDER — EPHEDRINE 5 MG/ML INJ
INTRAVENOUS | Status: AC
  Filled 2022-08-04: qty 5

## 2022-08-04 MED ORDER — ONDANSETRON HCL 4 MG/2ML IJ SOLN: 4.0000 mg | Freq: Once | INTRAMUSCULAR | Status: DC | PRN

## 2022-08-04 MED ORDER — DOCUSATE SODIUM 100 MG PO CAPS: 100.0000 mg | ORAL_CAPSULE | Freq: Two times a day (BID) | ORAL | 2 refills | Status: DC

## 2022-08-04 MED ORDER — SODIUM CHLORIDE 0.9 % IR SOLN
Status: DC | PRN
  Administered 2022-08-04: 1000 mL

## 2022-08-04 MED ORDER — ACETAMINOPHEN 500 MG PO TABS: 1000.0000 mg | ORAL_TABLET | Freq: Four times a day (QID) | ORAL | 0 refills | Status: AC

## 2022-08-04 MED ORDER — PHENYLEPHRINE 80 MCG/ML (10ML) SYRINGE FOR IV PUSH (FOR BLOOD PRESSURE SUPPORT)
PREFILLED_SYRINGE | INTRAVENOUS | Status: AC
  Filled 2022-08-04: qty 20

## 2022-08-04 SURGICAL SUPPLY — 40 items
APPLIER CLIP ROT 10 11.4 M/L (STAPLE) ×1
BLADE SURG 15 STRL LF DISP TIS (BLADE) ×1 IMPLANT
BLADE SURG 15 STRL SS (BLADE) ×1
CHLORAPREP W/TINT 26 (MISCELLANEOUS) ×1 IMPLANT
CLIP APPLIE ROT 10 11.4 M/L (STAPLE) ×1 IMPLANT
CLOTH BEACON ORANGE TIMEOUT ST (SAFETY) ×1 IMPLANT
COVER LIGHT HANDLE STERIS (MISCELLANEOUS) ×2 IMPLANT
DECANTER SPIKE VIAL GLASS SM (MISCELLANEOUS) ×1 IMPLANT
DERMABOND ADVANCED .7 DNX6 (GAUZE/BANDAGES/DRESSINGS) IMPLANT
ELECT REM PT RETURN 9FT ADLT (ELECTROSURGICAL) ×1
ELECTRODE REM PT RTRN 9FT ADLT (ELECTROSURGICAL) ×1 IMPLANT
GLOVE BIOGEL PI IND STRL 6.5 (GLOVE) ×1 IMPLANT
GLOVE BIOGEL PI IND STRL 7.0 (GLOVE) ×2 IMPLANT
GLOVE SURG SS PI 6.5 STRL IVOR (GLOVE) ×2 IMPLANT
GOWN STRL REUS W/TWL LRG LVL3 (GOWN DISPOSABLE) ×3 IMPLANT
HEMOSTAT SNOW SURGICEL 2X4 (HEMOSTASIS) ×1 IMPLANT
INST SET LAPROSCOPIC AP (KITS) ×1 IMPLANT
KIT TURNOVER KIT A (KITS) ×1 IMPLANT
MANIFOLD NEPTUNE II (INSTRUMENTS) ×1 IMPLANT
NDL INSUFFLATION 14GA 120MM (NEEDLE) ×1 IMPLANT
NEEDLE INSUFFLATION 14GA 120MM (NEEDLE) ×1 IMPLANT
NS IRRIG 1000ML POUR BTL (IV SOLUTION) ×1 IMPLANT
PACK LAP CHOLE LZT030E (CUSTOM PROCEDURE TRAY) ×1 IMPLANT
PAD ARMBOARD 7.5X6 YLW CONV (MISCELLANEOUS) ×1 IMPLANT
PENCIL HANDSWITCHING (ELECTRODE) IMPLANT
POWDER SURGICEL 3.0 GRAM (HEMOSTASIS) IMPLANT
SET BASIN LINEN APH (SET/KITS/TRAYS/PACK) ×1 IMPLANT
SET TUBE SMOKE EVAC HIGH FLOW (TUBING) ×1 IMPLANT
SLEEVE Z-THREAD 5X100MM (TROCAR) ×1 IMPLANT
SUT MNCRL AB 4-0 PS2 18 (SUTURE) ×2 IMPLANT
SUT VICRYL 0 UR6 27IN ABS (SUTURE) ×1 IMPLANT
SYS BAG RETRIEVAL 10MM (BASKET) ×1
SYSTEM BAG RETRIEVAL 10MM (BASKET) ×1 IMPLANT
TIP ENDOSCOPIC SURGICEL (TIP) IMPLANT
TROCAR Z-THRD FIOS HNDL 11X100 (TROCAR) ×1 IMPLANT
TROCAR Z-THREAD FIOS 5X100MM (TROCAR) ×1 IMPLANT
TROCAR Z-THREAD SLEEVE 11X100 (TROCAR) ×1 IMPLANT
TUBE CONNECTING 12X1/4 (SUCTIONS) ×1 IMPLANT
WARMER LAPAROSCOPE (MISCELLANEOUS) ×1 IMPLANT
YANKAUER SUCT BULB TIP 10FT TU (MISCELLANEOUS) IMPLANT

## 2022-08-04 NOTE — Discharge Instructions (Signed)
Ambulatory Surgery Discharge Instructions  General Anesthesia or Sedation Do not drive or operate heavy machinery for 24 hours.  Do not consume alcohol, tranquilizers, sleeping medications, or any non-prescribed medications for 24 hours. Do not make important decisions or sign any important papers in the next 24 hours. You should have someone with you tonight at home.  Activity  You are advised to go directly home from the hospital.  Restrict your activities and rest for a day.  Resume light activity tomorrow. No heavy lifting over 10 lbs or strenuous exercise.  Fluids and Diet Begin with clear liquids, bouillon, dry toast, soda crackers.  If not nauseated, you may go to a regular diet when you desire.  Greasy and spicy foods are not advised.  Medications  If you have not had a bowel movement in 24 hours, take 2 tablespoons over the counter Milk of mag.             You May resume your blood thinners tomorrow (Aspirin, coumadin, or other).  You are being discharged with prescriptions for Opioid/Narcotic Medications: There are some specific considerations for these medications that you should know. Opioid Meds have risks & benefits. Addiction to these meds is always a concern with prolonged use Take medication only as directed Do not drive while taking narcotic pain medication Do not crush tablets or capsules Do not use a different container than medication was dispensed in Lock the container of medication in a cool, dry place out of reach of children and pets. Opioid medication can cause addiction Do not share with anyone else (this is a felony) Do not store medications for future use. Dispose of them properly.     Disposal:  Find a [place] household drug take back site near you.  If you can't get to a drug take back site, use the recipe below as a last resort to dispose of expired, unused or unwanted drugs. Disposal  (Do not dispose chemotherapy drugs this way, talk to your  prescribing doctor instead.) Step 1: Mix drugs (do not crush) with dirt, kitty litter, or used coffee grounds and add a small amount of water to dissolve any solid medications. Step 2: Seal drugs in plastic bag. Step 3: Place plastic bag in trash. Step 4: Take prescription container and scratch out personal information, then recycle or throw away.  Operative Site  You have a liquid bandage over your incisions, this will begin to flake off in about a week. Ok to shower tomorrow. Keep wound clean and dry. No baths or swimming. No lifting more than 10 pounds.  Contact Information: If you have questions or concerns, please call our office, [phone], Monday- Thursday 8AM-5PM and Friday 8AM-12Noon.  If it is after hours or on the weekend, please call [facility] Main Number, [phone], and ask to speak to the surgeon on call for Dr. Kambree Krauss at [place].   SPECIFIC COMPLICATIONS TO WATCH FOR: Inability to urinate Fever over 101? F by mouth Nausea and vomiting lasting longer than 24 hours. Pain not relieved by medication ordered Swelling around the operative site Increased redness, warmth, hardness, around operative area Numbness, tingling, or cold fingers or toes Blood -soaked dressing, (small amounts of oozing may be normal) Increasing and progressive drainage from surgical area or exam site  

## 2022-08-04 NOTE — H&P (Signed)
[facility] History and Physical  Reason for Referral: Cholelithiasis Referring Physician: Dr. Chauncey Mann is a 71 y.o. male.  HPI: Patient presents for evaluation of cholelithiasis.  About 1 month ago he had his first and only episode of epigastric abdominal pain.  The pain was almost up into his chest, so he took a nitroglycerin, as he was concerned he may have a heart attack.  He started developing the pain in the evening after having a hamburger steak, potatoes, and gravy.  He confirms nausea with an episode of emesis at the time, but since then has denied any further episodes.  He confirms having some constipation recently.  He denies ever having an episode like this prior.  He believes that his pain was secondary to GI upset.  When he followed up with Dr. Wolfgang Phoenix recently, it was believed that this was secondary to GI upset, however he did obtain an abdominal ultrasound to rule out any other pathology.  This demonstrated cholelithiasis without signs of acute cholecystitis.  His past medical history is significant for hypothyroidism, hyperlipidemia, and hypertension.  His surgical history is significant for laparoscopic appendectomy and CABG 16 to 17 years ago.  He is no longer on any blood thinning medications.  He last saw his cardiologist in February, who recommended that he follow-up in 6 months.  He saw his primary care doctor, and was complaining of some atypical chest pains.  His primary care doctor recommended evaluation by the cardiologist, to see if he needs another stress test.  His last stress test was in 2019.  He denies use of tobacco products and illicit drugs.  He will occasionally drink alcohol.  He denies any further gallbladder pains since evaluation.  Past Medical History:  Diagnosis Date   Abnormal nuclear stress test false positive 05/2018   BPH (benign prostatic hyperplasia) 04/21/2019   Chest pain stable CAD with cath 06/07/18 possible GI  06/03/2018   Coronary artery disease    a. s/p CABG 2005. b. cath 05/2018 - patent LIMA-LAD, moderate LM and prox OM disease with neg FFR.   Dyslipidemia    Esophageal stricture    ESOPHAGEAL STRICTURE 12/18/2008   Qualifier: History of  By: Harlon Ditty CMA (AAMA), Dottie     ESOPHAGITIS 12/18/2008   Qualifier: History of  By: Harlon Ditty CMA (AAMA), Dottie     Essential hypertension, benign 04/06/2013   Gastrointestinal bleeding    GERD (gastroesophageal reflux disease)    History of colonic polyps 08/26/2014   [facility] [place] during colonoscopy in May of 2015. They recommend repeat colonoscopy May of 2017 do to inadequate prep, pathology showed hyperplastic polyp    Hyperlipidemia    Hypothyroidism    Insomnia 04/21/2019   Obstructive sleep apnea 07/11/2013   Has mixed apnea, CPAP setting 7    OSA (obstructive sleep apnea)    RECTAL BLEEDING 12/18/2008   Qualifier: Diagnosis of  By: Harlon Ditty CMA (AAMA), Dottie     Urinary retention    Vitiligo 07/11/2013    Past Surgical History:  Procedure Laterality Date   APPENDECTOMY     bilateral inguinal hernia repair     CARDIAC CATHETERIZATION  12/2005, 06/2005   COLONOSCOPY     CORONARY ARTERY BYPASS GRAFT  2006   LIMA to LAD, off pump   INTRAVASCULAR PRESSURE WIRE/FFR STUDY N/A 06/07/2018   Procedure: INTRAVASCULAR PRESSURE WIRE/FFR STUDY;  Surgeon: Leonie Man, MD;  Location: [facility];  Service: Cardiovascular;  Laterality: N/A;   LEFT HEART CATH AND CORS/GRAFTS ANGIOGRAPHY N/A 06/07/2018   Procedure: LEFT HEART CATH AND CORS/GRAFTS ANGIOGRAPHY;  Surgeon: Marykay Lex, MD;  Location: [facility] IN[facility]ASIVE [facility];  Service: Cardiovascular;  Laterality: N/A;   OPEN REDUCTION INTERNAL FIXATION (ORIF) HAND Left 07/21/2015   Procedure: IRRIGATION AND DEBRIDMENT LEFT INDEX AND MIDDLE FINGER, OPEN REDUCTION INTERNAL FIXATION LEFT INDEX FINGER.;  Surgeon: Dominica Severin, MD;  Location: [facility] OR;   Service: Orthopedics;  Laterality: Left;    Family History  Problem Relation Age of Onset   Heart disease Father    Hypertension Father     Social History   Tobacco Use   Smoking status: Former    Packs/day: 2.00    Years: 20.00    Total pack years: 40.00    Types: Cigarettes    Quit date: 07/31/1979    Years since quitting: 43.0   Smokeless tobacco: Former    Types: Chew    Quit date: 07/22/1995  Vaping Use   Vaping Use: Never used  Substance Use Topics   Alcohol use: Yes    Comment: occassional   Drug use: No    Medications: I have reviewed the patient's current medications. Allergies as of 08/04/2022       Reactions   Mobic [meloxicam] Rash      ROS:  Constitutional: negative for chills, fatigue, and fevers Eyes: negative for visual disturbance and pain Ears, nose, mouth, throat, and face: negative for ear drainage, sore throat, and sinus problems Respiratory: negative for cough, wheezing, and shortness of breath Cardiovascular: negative for chest pain Gastrointestinal: negative for abdominal pain, nausea, reflux symptoms, and vomiting Genitourinary:positive for frequency, negative for dysuria and urinary retention Integument/breast: negative for dryness and rash Hematologic/lymphatic: negative for bleeding and lymphadenopathy Musculoskeletal:positive for back pain, neck pain, and joint pain Neurological: positive for numbness, negative for dizziness and tremors Endocrine: negative for temperature intolerance  Blood pressure (!) 146/82, pulse 63, temperature 97.8 F (36.6 C), temperature source Oral, resp. rate 12, height 5\' 11"  (1.803 m), weight 204 lb (92.5 kg), SpO2 95 %. Physical Exam Vitals reviewed.  Constitutional:      Appearance: Normal appearance.  HENT:     Head: Normocephalic and atraumatic.  Eyes:     Extraocular Movements: Extraocular movements intact.     Pupils: Pupils are equal, round, and reactive to light.  Cardiovascular:     Rate and  Rhythm: Normal rate and regular rhythm.  Pulmonary:     Effort: Pulmonary effort is normal.     Breath sounds: Normal breath sounds.  Abdominal:     Comments: Abdomen soft, nondistended, no percussion tenderness, nontender to palpation; no rigidity, guarding, rebound tenderness; negative Murphy's  Musculoskeletal:        General: Normal range of motion.     Cervical back: Normal range of motion.  Skin:    General: Skin is warm and dry.  Neurological:     General: No focal deficit present.     Mental Status: He is alert and oriented to person, place, and time.  Psychiatric:        Mood and Affect: Mood normal.        Behavior: Behavior normal.   Results: No results found for this or any previous visit (from the past 48 hour(s)).  No results found.   Assessment & Plan:  CARROL BONDAR is a 71 y.o. male who presents for evaluation of cholelithiasis.   -  I explained the pathophysiology of gallbladder disease, and why we recommend cholecystectomy with a history of symptomatic cholelithiasis.  I did express to the patient that it is unknown whether his gallstones were the cause of his pain versus GI upset. -I counseled the patient about the indication, risks and benefits of laparoscopic cholecystectomy.  He understands there is a very small chance for bleeding, infection, injury to normal structures (including common bile duct), conversion to open surgery, persistent symptoms, evolution of postcholecystectomy diarrhea, need for secondary interventions, anesthesia reaction, cardiopulmonary issues and other risks not specifically detailed here. I described the expected recovery, the plan for follow-up and the restrictions during the recovery phase.  All questions were answered. -Patient was cleared by cardiology for surgical intervention if needed -He has decided to proceed with surgical intervention.   All questions were answered to the satisfaction of the patient.  Theophilus Kinds,  DO [facility] [address] [place], [place] [address] [phone] (office)

## 2022-08-04 NOTE — Anesthesia Postprocedure Evaluation (Signed)
Anesthesia Post Note  Patient: Mark TOOKER  Procedure(s) Performed: LAPAROSCOPIC CHOLECYSTECTOMY (Abdomen)  Patient location during evaluation: Phase II Anesthesia Type: General Level of consciousness: awake Pain management: pain level controlled Vital Signs Assessment: post-procedure vital signs reviewed and stable Respiratory status: spontaneous breathing and respiratory function stable Cardiovascular status: blood pressure returned to baseline and stable Postop Assessment: no headache and no apparent nausea or vomiting Anesthetic complications: no Comments: Late entry   No notable events documented.   Last Vitals:  Vitals:   08/04/22 1015 08/04/22 1027  BP: (!) 163/82 (!) 159/94  Pulse: 61 63  Resp: 15 19  Temp:  36.6 C  SpO2: 98% 99%    Last Pain:  Vitals:   08/04/22 1027  TempSrc: Oral  PainSc:                  [name]

## 2022-08-04 NOTE — Op Note (Signed)
Operative Note   Preoperative Diagnosis: Symptomatic cholelithiasis   Postoperative Diagnosis: Same   Procedure(s) Performed: Laparoscopic cholecystectomy   Surgeon: Graciella Freer, DO    Assistants: Cecilio Asper, RN   Anesthesia: General endotracheal   Anesthesiologist: Dr. Briant Cedar   Specimens: Gallbladder    Estimated Blood Loss: Minimal    Blood Replacement: None    Complications: None    Indications: Patient is a 71 year old male who presents for laparoscopic cholecystectomy.  He has a history of likely gallbladder attack and ultrasound demonstrates cholelithiasis.  He is agreeable to surgery.  All risks and benefits of performing this procedure were discussed with the patient including pain, infection, bleeding, damage to the surrounding structures, and need for more procedures or surgery. The patient voiced understanding of the procedure, all questions were sought and answered, and consent was obtained.  Operative Findings: Mildly inflamed gallbladder   Procedure: The patient was taken to the operating room and placed supine. General endotracheal anesthesia was induced. Intravenous antibiotics were administered per protocol. An orogastric tube positioned to decompress the stomach. The abdomen was prepared and draped in the usual sterile fashion.    A supraumbilical incision was made and a Veress technique was utilized to achieve pneumoperitoneum to 15 mmHg with carbon dioxide. A 11 mm optiview port was placed through the supraumbilical region, and a 10 mm 0-degree operative laparoscope was introduced. The area underlying the trocar and Veress needle were inspected and without evidence of injury.  Remaining trocars were placed under direct vision. Two 5 mm ports were placed in the right abdomen, between the anterior axillary and midclavicular line.  A final 11 mm port was placed through the mid-epigastrium, near the falciform ligament.    The gallbladder fundus was elevated  cephalad and the infundibulum was retracted to the patient's right. The gallbladder/cystic duct junction was skeletonized. The cystic artery noted in the triangle of Calot and was also skeletonized.  We then continued liberal medial and lateral dissection until the critical view of safety was achieved.    The cystic duct and cystic artery were doubly clipped and divided. The gallbladder was then dissected from the liver bed with electrocautery. The specimen was placed in an Endopouch and was retrieved through the epigastric site.   Final inspection revealed acceptable hemostasis. Surgicel powder and surgical SNOW was placed in the gallbladder bed.  Trocars were removed and pneumoperitoneum was released.  0 Vicryl fascial sutures were used to close the epigastric and umbilical port sites. Skin incisions were closed with 4-0 Monocryl subcuticular sutures and Dermabond. The patient was awakened from anesthesia and extubated without complication.    Graciella Freer, DO  [facility] [address], [place] [address] [phone] (office)

## 2022-08-04 NOTE — Anesthesia Preprocedure Evaluation (Signed)
Anesthesia Evaluation  Patient identified by MRN, date of birth, ID band Patient awake    Reviewed: Allergy & Precautions, H&P , NPO status , Patient's Chart, lab work & pertinent test results, reviewed documented beta blocker date and time   Airway Mallampati: II  TM Distance: >3 FB Neck ROM: full    Dental no notable dental hx.    Pulmonary sleep apnea , former smoker,    Pulmonary exam normal breath sounds clear to auscultation       Cardiovascular Exercise Tolerance: Good hypertension, + CAD and + CABG   Rhythm:regular Rate:Normal     Neuro/Psych negative neurological ROS  negative psych ROS   GI/Hepatic Neg liver ROS, GERD  Medicated,  Endo/Other  negative endocrine ROS  Renal/GU negative Renal ROS  negative genitourinary   Musculoskeletal   Abdominal   Peds  Hematology negative hematology ROS (+)   Anesthesia Other Findings   Reproductive/Obstetrics negative OB ROS                             Anesthesia Physical Anesthesia Plan  ASA: 3  Anesthesia Plan: General and General ETT   Post-op Pain Management:    Induction:   PONV Risk Score and Plan: Ondansetron  Airway Management Planned:   Additional Equipment:   Intra-op Plan:   Post-operative Plan:   Informed Consent: I have reviewed the patients History and Physical, chart, labs and discussed the procedure including the risks, benefits and alternatives for the proposed anesthesia with the patient or authorized representative who has indicated his/her understanding and acceptance.     Dental Advisory Given  Plan Discussed with: CRNA  Anesthesia Plan Comments:         Anesthesia Quick Evaluation

## 2022-08-04 NOTE — Anesthesia Procedure Notes (Signed)
Procedure Name: Intubation Date/Time: 08/04/2022 7:45 AM  Performed by: Vista Deck, CRNAPre-anesthesia Checklist: Patient identified, Patient being monitored, Timeout performed, Emergency Drugs available and Suction available Patient Re-evaluated:Patient Re-evaluated prior to induction Oxygen Delivery Method: Circle System Utilized Preoxygenation: Pre-oxygenation with 100% oxygen Induction Type: IV induction Ventilation: Mask ventilation without difficulty Laryngoscope Size: Miller and 2 Grade View: Grade I Tube type: Oral Tube size: 7.5 mm Number of attempts: 1 Airway Equipment and Method: Stylet Placement Confirmation: ETT inserted through vocal cords under direct vision, positive ETCO2 and breath sounds checked- equal and bilateral Secured at: 23 cm Tube secured with: Tape Dental Injury: Teeth and Oropharynx as per pre-operative assessment

## 2022-08-04 NOTE — Progress Notes (Signed)
[facility]  Spoke with the patient's wife in the consultation room.  I explained that he tolerated the procedure without issue, and his gallbladder was able to be removed.  He has dissolvable stitches under the skin and overlying skin glue.  This will flake off in 10-14 days.  I will be discharging him home with prescriptions for a narcotic pain medication, schedule Tylenol, and Colace.  If he is take the narcotic pain medication, he should take the stool softener.  I also advised that he hold off on taking his naproxen for the next 24 hours.  All questions were answered to her expressed satisfaction.  Graciella Freer, DO [facility] [address] [place], [place] [address] [phone] (office)

## 2022-08-04 NOTE — Transfer of Care (Signed)
Immediate Anesthesia Transfer of Care Note  Patient: Mark [name]  Procedure(s) Performed: LAPAROSCOPIC CHOLECYSTECTOMY (Abdomen)  Patient Location: PACU  Anesthesia Type:General  Level of Consciousness: drowsy and patient cooperative  Airway & Oxygen Therapy: Patient Spontanous Breathing and Patient connected to nasal cannula oxygen  Post-op Assessment: Report given to RN and Post -op Vital signs reviewed and stable  Post vital signs: Reviewed and stable  Last Vitals:  Vitals Value Taken Time  BP 162.71 929  Temp 97.9 929  Pulse 61 08/04/22 0928  Resp 13 08/04/22 0927  SpO2 100 % 08/04/22 0928  Vitals shown include unvalidated device data.  Last Pain:  Vitals:   08/04/22 0707  TempSrc: Oral  PainSc: 9       Patients Stated Pain Goal: 8 (63/87/56 4332)  Complications: No notable events documented.

## 2022-08-05 LAB — SURGICAL PATHOLOGY

## 2022-08-08 ENCOUNTER — Encounter (HOSPITAL_COMMUNITY): Payer: Self-pay | Admitting: Surgery

## 2022-08-12 ENCOUNTER — Other Ambulatory Visit: Payer: Self-pay | Admitting: Family Medicine

## 2022-08-14 ENCOUNTER — Ambulatory Visit (INDEPENDENT_AMBULATORY_CARE_PROVIDER_SITE_OTHER): Payer: Medicare PPO | Admitting: Surgery

## 2022-08-14 ENCOUNTER — Encounter: Payer: Medicare PPO | Admitting: Surgery

## 2022-08-14 DIAGNOSIS — Z09 Encounter for follow-up examination after completed treatment for conditions other than malignant neoplasm: Secondary | ICD-10-CM

## 2022-08-14 NOTE — Progress Notes (Signed)
[facility]  I am calling the patient for post operative evaluation. This is not a billable encounter as it is under the [facility] charges for the surgery.  The patient had a laparoscopic cholecystectomy on 9/18. There was no answer with the phone call.  I left a voicemail regarding his pathology, and advised him to call the office if he has any questions of concerns.  Pathology: A. 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 [address] [place], [place] [address] [phone] (office)

## 2022-08-19 DIAGNOSIS — M5412 Radiculopathy, cervical region: Secondary | ICD-10-CM | POA: Diagnosis not present

## 2022-08-19 DIAGNOSIS — Z6827 Body mass index (BMI) 27.0-27.9, adult: Secondary | ICD-10-CM | POA: Diagnosis not present

## 2022-09-18 DIAGNOSIS — M5412 Radiculopathy, cervical region: Secondary | ICD-10-CM | POA: Diagnosis not present

## 2022-09-18 DIAGNOSIS — Z6828 Body mass index (BMI) 28.0-28.9, adult: Secondary | ICD-10-CM | POA: Diagnosis not present

## 2022-09-21 ENCOUNTER — Emergency Department (HOSPITAL_COMMUNITY)
Admission: EM | Admit: 2022-09-21 | Discharge: 2022-09-21 | Disposition: A | Discharge status: Home / Self Care | Payer: Medicare PPO | Attending: Emergency Medicine | Admitting: Emergency Medicine

## 2022-09-21 ENCOUNTER — Emergency Department (HOSPITAL_COMMUNITY): Payer: Medicare PPO

## 2022-09-21 ENCOUNTER — Other Ambulatory Visit: Payer: Self-pay

## 2022-09-21 ENCOUNTER — Encounter (HOSPITAL_COMMUNITY): Payer: Self-pay

## 2022-09-21 DIAGNOSIS — Z87891 Personal history of nicotine dependence: Secondary | ICD-10-CM | POA: Diagnosis not present

## 2022-09-21 DIAGNOSIS — Z7982 Long term (current) use of aspirin: Secondary | ICD-10-CM | POA: Diagnosis not present

## 2022-09-21 DIAGNOSIS — E039 Hypothyroidism, unspecified: Secondary | ICD-10-CM | POA: Diagnosis not present

## 2022-09-21 DIAGNOSIS — R079 Chest pain, unspecified: Secondary | ICD-10-CM | POA: Diagnosis not present

## 2022-09-21 DIAGNOSIS — I10 Essential (primary) hypertension: Secondary | ICD-10-CM | POA: Diagnosis not present

## 2022-09-21 DIAGNOSIS — R519 Headache, unspecified: Secondary | ICD-10-CM | POA: Diagnosis present

## 2022-09-21 DIAGNOSIS — Z79899 Other long term (current) drug therapy: Secondary | ICD-10-CM | POA: Insufficient documentation

## 2022-09-21 DIAGNOSIS — I2510 Atherosclerotic heart disease of native coronary artery without angina pectoris: Secondary | ICD-10-CM | POA: Insufficient documentation

## 2022-09-21 DIAGNOSIS — Z7951 Long term (current) use of inhaled steroids: Secondary | ICD-10-CM | POA: Diagnosis not present

## 2022-09-21 LAB — BASIC METABOLIC PANEL
Anion gap: 12 (ref 5–15)
BUN: 15 mg/dL (ref 8–23)
CO2: 21 mmol/L — ABNORMAL LOW (ref 22–32)
Calcium: 9.4 mg/dL (ref 8.9–10.3)
Chloride: 105 mmol/L (ref 98–111)
Creatinine, Ser: 0.91 mg/dL (ref 0.61–1.24)
GFR, Estimated: >60 mL/min (ref >60)
Glucose, Bld: 115 mg/dL — ABNORMAL HIGH (ref 70–99)
Potassium: 4.1 mmol/L (ref 3.5–5.1)
Sodium: 138 mmol/L (ref 135–145)

## 2022-09-21 LAB — TROPONIN I (HIGH SENSITIVITY)
Troponin I (High Sensitivity): 4 ng/L (ref <18)
Troponin I (High Sensitivity): 5 ng/L (ref <18)

## 2022-09-21 LAB — CBC
HCT: 42.5 % (ref 39.0–52.0)
Hemoglobin: 14.6 g/dL (ref 13.0–17.0)
MCH: 31.6 pg (ref 26.0–34.0)
MCHC: 34.4 g/dL (ref 30.0–36.0)
MCV: 92 fL (ref 80.0–100.0)
Platelets: 165 10*3/uL (ref 150–400)
RBC: 4.62 MIL/uL (ref 4.22–5.81)
RDW: 12.4 % (ref 11.5–15.5)
WBC: 5.5 10*3/uL (ref 4.0–10.5)
nRBC: 0 % (ref 0.0–0.2)

## 2022-09-21 NOTE — Discharge Instructions (Addendum)
We evaluated you for your high blood pressure.  Your lab testing was reassuring and your blood pressure improved in the emergency department without treatment.  We think it is safe to go home and follow-up with your primary doctor.  Please return if you develop any new or worsening symptoms such as severe chest pain, headaches, difficulty breathing, numbness or tingling, weakness, or any other concerning symptoms.

## 2022-09-21 NOTE — ED Triage Notes (Signed)
Pt arrived POV from home c/o his BP being elevated today. Pt states he just kept checking it and it got higher and higher then he had a few moments where he felt lightheaded and a funny feeling in his chest. Pt states all of that has resolved but he thought he should come get checked out.

## 2022-09-21 NOTE — ED Provider Triage Note (Addendum)
Emergency Medicine Provider Triage Evaluation Note  Mark [name] , a 71 y.o. male  was evaluated in triage.  Pt complains of history of BPH, OSA, CAD, GERD, hyperlipidemia, hypertension.  Patient presents for concern of hypertension he reports checking blood pressure at home and was around 892 systolic.  He reports intermittent chest pressure sensation that does not radiate for the past 3 days that worsened when he is checking his blood pressure today and was associated with a lightheaded sensation.  Both his chest pain and lightheadedness has resolved.  He reports compliance with his daily blood pressure medication  Review of Systems  Positive: Chest pressure, lightheadedness Negative: Nausea, vomiting, fall, injury, numbness, tingling, weakness or any additional concerns  Physical Exam  BP (!) 150/100 (BP Location: Right Arm)   Pulse 67   Temp 98.4 F (36.9 C)   Resp 16   Ht 5\' 11"  (1.803 m)   Wt 93 kg   SpO2 96%   BMI 28.59 kg/m  Gen:   Awake, no distress   Resp:  Normal effort, lungs clear MSK:   Moves extremities without difficulty  Other:  Heart regular rate and rhythm.  Speech is clear and goal oriented, follows commands Major Cranial nerves without deficit, no facial droop Normal strength in upper and lower extremities bilaterally including dorsiflexion and plantar flexion, strong and equal grip strength Sensation normal to light and sharp touch Moves extremities without ataxia, coordination intact Normal finger to nose and rapid alternating movements Neg romberg, no pronator drift Normal gait  Medical Decision Making  Medically screening exam initiated at 1:18 PM.  Appropriate orders placed.  Sharyn Lull was informed that the remainder of the evaluation will be completed by another provider, this initial triage assessment does not replace that evaluation, and the importance of remaining in the ED until their evaluation is complete.  Note: Portions of this  report may have been transcribed using voice recognition software. Every effort was made to ensure accuracy; however, inadvertent computerized transcription errors may still be present.    Deliah Boston, PA-C 09/21/22 1327    Deliah Boston, PA-C 09/21/22 1327

## 2022-09-22 ENCOUNTER — Telehealth: Payer: Self-pay

## 2022-09-22 ENCOUNTER — Other Ambulatory Visit: Payer: Self-pay | Admitting: Neurosurgery

## 2022-09-22 NOTE — Telephone Encounter (Signed)
  Patient Consent for Virtual Visit         Mark [name] has provided verbal consent on 09/22/2022 for a virtual visit (video or telephone).   CONSENT FOR VIRTUAL VISIT FOR:  Mark [name]  By participating in this virtual visit I agree to the following:  I hereby voluntarily request, consent and authorize [facility] and its employed or contracted physicians, physician assistants, nurse practitioners or other licensed health care professionals (the Practitioner), to provide me with telemedicine health care services (the "Services") as deemed necessary by the treating Practitioner. I acknowledge and consent to receive the Services by the Practitioner via telemedicine. I understand that the telemedicine visit will involve communicating with the Practitioner through live audiovisual communication technology and the disclosure of certain medical information by electronic transmission. I acknowledge that I have been given the opportunity to request an in-person assessment or other available alternative prior to the telemedicine visit and am voluntarily participating in the telemedicine visit.  I understand that I have the right to withhold or withdraw my consent to the use of telemedicine in the course of my care at any time, without affecting my right to future care or treatment, and that the Practitioner or I may terminate the telemedicine visit at any time. I understand that I have the right to inspect all information obtained and/or recorded in the course of the telemedicine visit and may receive copies of available information for a reasonable fee.  I understand that some of the potential risks of receiving the Services via telemedicine include:  Delay or interruption in medical evaluation due to technological equipment failure or disruption; Information transmitted may not be sufficient (e.g. poor resolution of images) to allow for appropriate medical decision making by the  Practitioner; and/or  In rare instances, security protocols could fail, causing a breach of personal health information.  Furthermore, I acknowledge that it is my responsibility to provide information about my medical history, conditions and care that is complete and accurate to the best of my ability. I acknowledge that Practitioner's advice, recommendations, and/or decision may be based on factors not within their control, such as incomplete or inaccurate data provided by me or distortions of diagnostic images or specimens that may result from electronic transmissions. I understand that the practice of medicine is not an exact science and that Practitioner makes no warranties or guarantees regarding treatment outcomes. I acknowledge that a copy of this consent can be made available to me via my patient portal ([facility]), or I can request a printed copy by calling the office of [facility].    I understand that my insurance will be billed for this visit.   I have read or had this consent read to me. I understand the contents of this consent, which adequately explains the benefits and risks of the Services being provided via telemedicine.  I have been provided ample opportunity to ask questions regarding this consent and the Services and have had my questions answered to my satisfaction. I give my informed consent for the services to be provided through the use of telemedicine in my medical care

## 2022-09-22 NOTE — Telephone Encounter (Signed)
..     Pre-operative Risk Assessment    Patient Name: Mark [name]  DOB: 07-17-1951 MRN: [id number]      Request for Surgical Clearance    Procedure:   c5-c6 anterior cervical fusion  Date of Surgery:  Clearance 10/15/22                                 Surgeon:  DR Kary Kos Surgeon's Group or Practice Name:  [facility] Phone number:  [phone] Fax number:  [phone]   Type of Clearance Requested:   - Medical  - Pharmacy:  Hold Aspirin     Type of Anesthesia:  General    Additional requests/questions:    [name]   09/22/2022, 3:11 PM

## 2022-09-22 NOTE — ED Provider Notes (Signed)
[facility] EMERGENCY DEPARTMENT Provider Note  CSN: [id number] Arrival date & time: 09/21/22 1218  Chief Complaint(s) Hypertension  HPI Mark [name] is a 71 y.o. male with history of coronary artery disease, hypertension, hyperlipidemia presenting with high blood pressure.  Patient reports that he felt a mild headache at home, decided to check his blood pressure and was elevated, he checked a few more times and it got higher.  He reports a vague chest tightness which is all resolved.  He denies any shortness of breath, diaphoresis, nausea, vomiting, numbness, tingling.  He reports his symptoms have all improved.  He feels at baseline currently.  He denies visual changes.   Past Medical History Past Medical History:  Diagnosis Date   Abnormal nuclear stress test false positive 05/2018   BPH (benign prostatic hyperplasia) 04/21/2019   Chest pain stable CAD with cath 06/07/18 possible GI 06/03/2018   Coronary artery disease    a. s/p CABG 2005. b. cath 05/2018 - patent LIMA-LAD, moderate LM and prox OM disease with neg FFR.   Dyslipidemia    Esophageal stricture    ESOPHAGEAL STRICTURE 12/18/2008   Qualifier: History of  By: Candice Camp CMA (AAMA), Dottie     ESOPHAGITIS 12/18/2008   Qualifier: History of  By: Candice Camp CMA (AAMA), Dottie     Essential hypertension, benign 04/06/2013   Gastrointestinal bleeding    GERD (gastroesophageal reflux disease)    History of colonic polyps 08/26/2014   [facility] [place] during colonoscopy in May of 2015. They recommend repeat colonoscopy May of 2017 do to inadequate prep, pathology showed hyperplastic polyp    Hyperlipidemia    Hypothyroidism    Insomnia 04/21/2019   Obstructive sleep apnea 07/11/2013   Has mixed apnea, CPAP setting 7    OSA (obstructive sleep apnea)    RECTAL BLEEDING 12/18/2008   Qualifier: Diagnosis of  By: Nelson-Smith CMA (AAMA), Dottie     Urinary retention     Vitiligo 07/11/2013   Patient Active Problem List   Diagnosis Date Noted   Calculus of gallbladder without cholecystitis without obstruction 02/20/2021   Fatty liver 02/20/2021   Insomnia 04/21/2019   BPH (benign prostatic hyperplasia) 04/21/2019   Angina pectoris (HCC)    Abnormal nuclear stress test false positive    Chest pain stable CAD with cath 06/07/18 possible GI 06/03/2018   History of colonic polyps 08/26/2014   Obstructive sleep apnea 07/11/2013   Vitiligo 07/11/2013   Essential hypertension, benign 04/06/2013   CORONARY ATHEROSCLEROSIS NATIVE CORONARY ARTERY 11/20/2009   Hypothyroidism 12/18/2008   Hyperlipidemia 12/18/2008   ESOPHAGITIS 12/18/2008   ESOPHAGEAL STRICTURE 12/18/2008   RECTAL BLEEDING 12/18/2008   Home Medication(s) Prior to Admission medications   Medication Sig Start Date End Date Taking? Authorizing Provider  albuterol (VENTOLIN HFA) 108 (90 Base) MCG/ACT inhaler TAKE 2 PUFFS BY MOUTH EVERY 6 HOURS AS NEEDED FOR WHEEZE OR SHORTNESS OF BREATH 12/06/20   Babs Sciara, MD  ALPRAZolam (XANAX) 0.25 MG tablet TAKE 1 TABLET (0.25 MG TOTAL) BY MOUTH AT BEDTIME AS NEEDED 06/30/22   Babs Sciara, MD  APPLE CIDER VINEGAR PO Take 1 each by mouth daily. Gummies    [provider]  aspirin 81 MG tablet Take 1 tablet (81 mg total) by mouth every morning. 08/06/22   Pappayliou, Santina Evans A, DO  atorvastatin (LIPITOR) 80 MG tablet Take 1 tablet (80 mg total) by mouth daily. 05/21/22   Babs Sciara, MD  augmented betamethasone dipropionate (DIPROLENE-AF) 0.05 % cream Apply 1 Application topically 2 (two) times daily as needed (irritation). 04/17/22   [provider]  cetirizine (ZYRTEC) 10 MG tablet Take 5 mg by mouth daily as needed for allergies.    [provider]  clindamycin (CLINDAGEL) 1 % gel Apply 1 Application topically 2 (two) times daily. 04/17/22   [provider]  docusate sodium (COLACE) 100 MG capsule Take 1 capsule  (100 mg total) by mouth 2 (two) times daily. 08/04/22 08/04/23  Pappayliou, Santina Evans A, DO  isosorbide mononitrate (IMDUR) 30 MG 24 hr tablet Take 30 mg by mouth daily.    [provider]  levothyroxine (SYNTHROID) 100 MCG tablet Take 1 tablet (100 mcg total) by mouth daily. 05/21/22   Babs Sciara, MD  losartan (COZAAR) 50 MG tablet TAKE 1 TABLET BY MOUTH EVERY DAY 08/12/22   Babs Sciara, MD  metoprolol succinate (TOPROL-XL) 25 MG 24 hr tablet Take 0.5 tablets (12.5 mg total) by mouth daily. 05/21/22   Babs Sciara, MD  naproxen (NAPROSYN) 500 MG tablet Take 1 tablet (500 mg total) by mouth daily. 08/06/22   Pappayliou, Santina Evans A, DO  nitroGLYCERIN (NITROSTAT) 0.4 MG SL tablet Place 0.4 mg under the tongue every 5 (five) minutes as needed for chest pain.    [provider]  omeprazole (PRILOSEC) 20 MG capsule TAKE 1 CAPSULE BY MOUTH EVERY DAY 06/30/22   Babs Sciara, MD  Saw Palmetto, Serenoa repens, (SAW PALMETTO PO) Take 1 capsule by mouth daily.    [provider]  tamsulosin (FLOMAX) 0.4 MG CAPS capsule Take 1 capsule (0.4 mg total) by mouth daily. 05/21/22   Babs Sciara, MD  triamcinolone cream (KENALOG) 0.1 % Apply 1 application topically 2 (two) times daily. Patient taking differently: Apply 1 application  topically 2 (two) times daily as needed (irritation). 03/04/18   Babs Sciara, MD  pravastatin (PRAVACHOL) 80 MG tablet Take 80 mg by mouth daily.    06/02/18  [provider]                                                                                                                                    Past Surgical History Past Surgical History:  Procedure Laterality Date   APPENDECTOMY     bilateral inguinal hernia repair     CARDIAC CATHETERIZATION  12/2005, 06/2005   CHOLECYSTECTOMY N/A 08/04/2022   Procedure: LAPAROSCOPIC CHOLECYSTECTOMY;  Surgeon: Lewie Chamber, DO;  Location: [facility];  Service: General;  Laterality: N/A;    COLONOSCOPY     CORONARY ARTERY BYPASS GRAFT  2006   LIMA to LAD, off pump   INTRAVASCULAR PRESSURE WIRE/FFR STUDY N/A 06/07/2018   Procedure: INTRAVASCULAR PRESSURE WIRE/FFR STUDY;  Surgeon: Marykay Lex, MD;  Location: [facility] IN[facility]ASIVE [facility];  Service: Cardiovascular;  Laterality: N/A;   LEFT HEART CATH AND CORS/GRAFTS ANGIOGRAPHY N/A  06/07/2018   Procedure: LEFT HEART CATH AND CORS/GRAFTS ANGIOGRAPHY;  Surgeon: Leonie Man, MD;  Location: [facility];  Service: Cardiovascular;  Laterality: N/A;   OPEN REDUCTION INTERNAL FIXATION (ORIF) HAND Left 07/21/2015   Procedure: IRRIGATION AND DEBRIDMENT LEFT INDEX AND MIDDLE FINGER, OPEN REDUCTION INTERNAL FIXATION LEFT INDEX FINGER.;  Surgeon: Roseanne Kaufman, MD;  Location: [facility];  Service: Orthopedics;  Laterality: Left;   Family History Family History  Problem Relation Age of Onset   Heart disease Father    Hypertension Father     Social History Social History   Tobacco Use   Smoking status: Former    Packs/day: 2.00    Years: 20.00    Total pack years: 40.00    Types: Cigarettes    Quit date: 07/31/1979    Years since quitting: 43.1   Smokeless tobacco: Former    Types: Chew    Quit date: 07/22/1995  Vaping Use   Vaping Use: Never used  Substance Use Topics   Alcohol use: Yes    Comment: occassional   Drug use: No   Allergies Mobic [meloxicam]  Review of Systems Review of Systems  All other systems reviewed and are negative.   Physical Exam Vital Signs  I have reviewed the triage vital signs BP (!) 154/91 (BP Location: Right Arm)   Pulse 63   Temp 98.4 F (36.9 C) (Oral)   Resp 16   Ht 5\' 11"  (1.803 m)   Wt 93 kg   SpO2 97%   BMI 28.59 kg/m  Physical Exam Vitals and nursing note reviewed.  Constitutional:      General: He is not in acute distress.    Appearance: Normal appearance.  HENT:     Mouth/Throat:     Mouth: Mucous membranes are moist.  Eyes:     Conjunctiva/sclera: Conjunctivae normal.   Cardiovascular:     Rate and Rhythm: Normal rate and regular rhythm.  Pulmonary:     Effort: Pulmonary effort is normal. No respiratory distress.     Breath sounds: Normal breath sounds.  Abdominal:     General: Abdomen is flat.     Palpations: Abdomen is soft.     Tenderness: There is no abdominal tenderness.  Musculoskeletal:     Right lower leg: No edema.     Left lower leg: No edema.  Skin:    General: Skin is warm and dry.     Capillary Refill: Capillary refill takes less than 2 seconds.  Neurological:     Mental Status: He is alert and oriented to person, place, and time. Mental status is at baseline.  Psychiatric:        Mood and Affect: Mood normal.        Behavior: Behavior normal.     ED Results and Treatments Labs (all labs ordered are listed, but only abnormal results are displayed) Labs Reviewed  BASIC METABOLIC PANEL - Abnormal; Notable for the following components:      Result Value   CO2 21 (*)    Glucose, Bld 115 (*)    All other components within normal limits  CBC  TROPONIN I (HIGH SENSITIVITY)  TROPONIN I (HIGH SENSITIVITY)  Radiology DG Chest 1 View  Result Date: 09/21/2022 CLINICAL DATA:  Chest pain.  Hypertension. EXAM: CHEST  1 VIEW COMPARISON:  06/03/2018 FINDINGS: The heart size and mediastinal contours are within normal limits. Prior median sternotomy again noted. Both lungs are clear. The visualized skeletal structures are unremarkable. IMPRESSION: No active disease. Electronically Signed   By: Danae OrleansJohn A Stahl M.D.   On: 09/21/2022 14:58    Pertinent labs & imaging results that were available during my care of the patient were reviewed by me and considered in my medical decision making (see MDM for details).  Medications Ordered in ED Medications - No data to display                                                                                                                                    Procedures Procedures  (including critical care time)  Medical Decision Making / ED Course   MDM:  71 year old male presenting to the emergency department with high blood pressure.  Patient well-appearing, physical exam is unremarkable.  Blood pressure improved in the emergency department without treatment.  Differential includes primary hypertension, hypertensive urgency, hypertensive emergency, ACS, musculoskeletal chest pain, dissection.  Suspect primary cause is likely just primary hypertension.  His symptoms have improved without treatment in the emergency department.  His blood pressure is elevated but not severely.  He is currently asymptomatic.  He has no signs of endorgan dysfunction to suggest hypertensive emergency.  Extremely low concern for ACS, EKG reassuring, troponin negative, symptoms not concerning for ACS.  Doubt dissection with reassuring chest x-ray, very mild chest pressure which is all resolved at this point.  Patient also reports checking his blood pressure and it was increasing and he felt anxious, which likely contributed to elevated blood pressure.  Advise close follow-up with primary physician for further medication titration of his home antihypertensives.      Additional history obtained: -Additional history obtained from spouse -External records from outside source obtained and reviewed including: Chart review including previous notes, labs, imaging, consultation notes including [facility] 2019   Lab Tests: -I ordered, reviewed, and interpreted labs.   The pertinent results include:   Labs Reviewed  BASIC METABOLIC PANEL - Abnormal; Notable for the following components:      Result Value   CO2 21 (*)    Glucose, Bld 115 (*)    All other components within normal limits  CBC  TROPONIN I (HIGH SENSITIVITY)  TROPONIN I (HIGH SENSITIVITY)    Notable for negative troponin  EKG   EKG  Interpretation  Date/Time:  Sunday September 21 2022 12:44:49 EST Ventricular Rate:  66 PR Interval:  168 QRS Duration: 76 QT Interval:  386 QTC Calculation: 404 R Axis:   11 Text Interpretation: Normal sinus rhythm Normal ECG Confirmed by Alvino BloodScheving, Ramesha Poster ([id number]) on 09/21/2022 7:48:51 PM         Imaging Studies ordered: I ordered imaging  studies including CXR On my interpretation imaging demonstrates no widened mediastinum  I independently visualized and interpreted imaging. I agree with the radiologist interpretation   Medicines ordered and prescription drug management: No orders of the defined types were placed in this encounter.   -I have reviewed the patients home medicines and have made adjustments as needed   Social Determinants of Health:  Diagnosis or treatment significantly limited by social determinants of health: former smoker   Reevaluation: After the interventions noted above, I reevaluated the patient and found that they have resolved  Co morbidities that complicate the patient evaluation  Past Medical History:  Diagnosis Date   Abnormal nuclear stress test false positive 05/2018   BPH (benign prostatic hyperplasia) 04/21/2019   Chest pain stable CAD with cath 06/07/18 possible GI 06/03/2018   Coronary artery disease    a. s/p CABG 2005. b. cath 05/2018 - patent LIMA-LAD, moderate LM and prox OM disease with neg FFR.   Dyslipidemia    Esophageal stricture    ESOPHAGEAL STRICTURE 12/18/2008   Qualifier: History of  By: Candice Camp CMA (AAMA), Dottie     ESOPHAGITIS 12/18/2008   Qualifier: History of  By: Candice Camp CMA (AAMA), Dottie     Essential hypertension, benign 04/06/2013   Gastrointestinal bleeding    GERD (gastroesophageal reflux disease)    History of colonic polyps 08/26/2014   [facility] [place] during colonoscopy in May of 2015. They recommend repeat colonoscopy May of 2017 do to inadequate prep,  pathology showed hyperplastic polyp    Hyperlipidemia    Hypothyroidism    Insomnia 04/21/2019   Obstructive sleep apnea 07/11/2013   Has mixed apnea, CPAP setting 7    OSA (obstructive sleep apnea)    RECTAL BLEEDING 12/18/2008   Qualifier: Diagnosis of  By: Nelson-Smith CMA (AAMA), Dottie     Urinary retention    Vitiligo 07/11/2013      Dispostion: Disposition decision including need for hospitalization was considered, and patient discharged from emergency department.    Final Clinical Impression(s) / ED Diagnoses Final diagnoses:  Hypertension, unspecified type     This chart was dictated using voice recognition software.  Despite best efforts to proofread,  errors can occur which can change the documentation meaning.    Lonell Grandchild, MD 09/22/22 1051

## 2022-09-22 NOTE — Telephone Encounter (Signed)
Patient agreeable with appointment; consent and med list reviewed.

## 2022-09-22 NOTE — Telephone Encounter (Signed)
   Name: Mark [name]  DOB: April 20, 1951  MRN: [id number]  Primary Cardiologist: Early Osmond, MD   Preoperative team, please contact this patient and set up a phone call appointment for further preoperative risk assessment. Please obtain consent and complete medication review. Thank you for your help.  I confirm that guidance regarding antiplatelet and oral anticoagulation therapy has been completed and, if necessary, noted below.  His aspirin may be held for 7 days prior to his procedure.  Please resume as soon as hemostasis is achieved.   Deberah Pelton, NP 09/22/2022, 4:20 PM [facility]

## 2022-09-23 ENCOUNTER — Other Ambulatory Visit: Payer: Self-pay | Admitting: Internal Medicine

## 2022-09-24 ENCOUNTER — Telehealth: Payer: Self-pay | Admitting: *Deleted

## 2022-09-24 NOTE — Patient Outreach (Signed)
  Care Coordination [facility] Note Transition Care Management Unsuccessful Follow-up Telephone Call  Date of discharge and from where: [place] on 09/22/22  Attempts:  1st Attempt  Reason for outreach: RED ED EMMI for not having a scheduled follow-up appointment with PCP.  Outcome of unsuccessful TCM follow-up call:  Left voice message. HIPAA compliant voicemail requesting that patient return my call to [phone].  Care Coordination Services: Contacted Dr Fletcher Anon office and requested that they outreach patient to schedule an ED follow-up.   Demetrios Loll, BSN, RN-BC RN Care Coordinator [facility]  [facility] Direct Dial: [phone] Main #: [phone]

## 2022-09-25 ENCOUNTER — Encounter: Payer: Self-pay | Admitting: *Deleted

## 2022-09-25 ENCOUNTER — Telehealth: Payer: Self-pay | Admitting: *Deleted

## 2022-09-25 NOTE — Patient Outreach (Signed)
  Care Coordination [facility] Note Transition Care Management Follow-up Telephone Call Date of discharge and from where: [name]/ HTN; EMMI Red Alert: "No scheduled follow up" How have you been since you were released from the hospital? "I am doing just fine.  I think my blood pressure shot up because I had been eating salty ribs... my blood pressure came right down in the ER without them having to treat it and it has been fine since then when I check it at home.  If I have any more problems, I will schedule a visit, but right now, it's much better and I am keeping an eye on it.  I don't think I need to schedule an appointment at this point, but I will call if anything changes.  I am scheduled to have surgery at the end of the month and the surgeon is following me closely" Any questions or concerns? No  Items Reviewed: Did the pt receive and understand the discharge instructions provided? Yes - verified that patient received; reviewed briefly with patient Medications obtained and verified? Yes - no medication changes noted, patient confirms same Other? No  Any new allergies since your discharge? No  Dietary orders reviewed? Yes Do you have support at home? Yes  spouse and daughter are able to assist as indicated/ needed; patient reports he is independent in all ADL's and iADL's  Home Care and Equipment/Supplies: Were home health services ordered? no If so, what is the name of the agency? N/A  Has the agency set up a time to come to the patient's home? not applicable Were any new equipment or medical supplies ordered?  No What is the name of the medical supply agency? N/A Were you able to get the supplies/equipment? not applicable Do you have any questions related to the use of the equipment or supplies? No N/A  Functional Questionnaire: (I = Independent and D = Dependent) ADLs: I  Bathing/Dressing- I  Meal Prep- I  Eating- I  Maintaining continence-  I  Transferring/Ambulation- I  Managing Meds- I  Follow up appointments reviewed:  PCP Hospital f/u appt confirmed? No  Scheduled to see - on - @ [facility] f/u appt confirmed? No  Scheduled to see - on - @ - Are transportation arrangements needed? No  If their condition worsens, is the pt aware to call PCP or go to the Emergency Dept.? Yes Was the patient provided with contact information for the PCP's office or ED? Yes Was to pt encouraged to call back with questions or concerns? Yes  SDOH assessments and interventions completed:   Yes  Care Coordination Interventions Activated:  Yes   Care Coordination Interventions:  Reinforced need to follow low sodium diet and monitor blood pressures at home    Encounter Outcome:  Pt. Visit Completed    Caryl Pina, RN, BSN, CCRN Alumnus RN CM Care Coordination/ Transition of Care- [facility] [phone]: direct office

## 2022-09-28 ENCOUNTER — Encounter: Payer: Self-pay | Admitting: Family Medicine

## 2022-09-30 ENCOUNTER — Ambulatory Visit: Payer: Medicare PPO | Attending: Cardiology | Admitting: Physician Assistant

## 2022-09-30 DIAGNOSIS — Z01810 Encounter for preprocedural cardiovascular examination: Secondary | ICD-10-CM
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
## 2022-10-01 ENCOUNTER — Ambulatory Visit (INDEPENDENT_AMBULATORY_CARE_PROVIDER_SITE_OTHER): Payer: Medicare PPO | Admitting: Family Medicine

## 2022-10-01 VITALS — BP 126/78 | Ht 71.0 in | Wt 206.0 lb

## 2022-10-01 DIAGNOSIS — I10 Essential (primary) hypertension: Secondary | ICD-10-CM | POA: Diagnosis not present

## 2022-10-01 NOTE — Patient Instructions (Signed)

## 2022-10-01 NOTE — Progress Notes (Signed)
   Subjective:    Patient ID: Mark [name], male    DOB: 1951-08-05, 71 y.o.   MRN: [id number]  HPI ER follow up for hypertension  Blood pressure elevated in the ER felt to be related to poor dietary choices Try to eat healthier now Doing walking Has surgery coming up Cardiology gave him clearance   Review of Systems     Objective:   Physical Exam General-in no acute distress Eyes-no discharge Lungs-respiratory rate normal, CTA CV-no murmurs,RRR Extremities skin warm dry no edema Neuro grossly normal Behavior normal, alert        Assessment & Plan:   HTN Blood pressure was checked multiple times Under good control Do not recommend increasing medication currently I did check blood pressure cuff with his machine and ours his machine runs about 6-8 points high on the systolic and 6 points high on the diastolic he will send [place] some readings early next week if having severe elevation of blood pressure at home may need to bump up dose of medication Minimize salt in the diet healthy diet discussed continue walking

## 2022-10-06 ENCOUNTER — Ambulatory Visit (HOSPITAL_COMMUNITY)
Admission: RE | Admit: 2022-10-06 | Discharge: 2022-10-06 | Disposition: A | Discharge status: Home / Self Care | Payer: Medicare PPO | Source: Ambulatory Visit | Attending: Nurse Practitioner | Admitting: Nurse Practitioner

## 2022-10-06 ENCOUNTER — Ambulatory Visit (INDEPENDENT_AMBULATORY_CARE_PROVIDER_SITE_OTHER): Payer: Medicare PPO | Admitting: Nurse Practitioner

## 2022-10-06 VITALS — BP 126/68 | HR 59 | Temp 97.2°F | Ht 71.0 in | Wt 202.0 lb

## 2022-10-06 DIAGNOSIS — R051 Acute cough: Secondary | ICD-10-CM | POA: Insufficient documentation

## 2022-10-06 DIAGNOSIS — J189 Pneumonia, unspecified organism: Secondary | ICD-10-CM

## 2022-10-06 DIAGNOSIS — R059 Cough, unspecified: Secondary | ICD-10-CM | POA: Diagnosis not present

## 2022-10-06 MED ORDER — AMOXICILLIN 500 MG PO TABS: 1000.0000 mg | ORAL_TABLET | Freq: Three times a day (TID) | ORAL | 0 refills | Status: AC

## 2022-10-06 MED ORDER — AMOXICILLIN 875 MG PO TABS: 875.0000 mg | ORAL_TABLET | Freq: Two times a day (BID) | ORAL | 0 refills | Status: DC

## 2022-10-06 NOTE — Progress Notes (Signed)
   Subjective:    Patient ID: Mark [name], male    DOB: 1951/03/24, 71 y.o.   MRN: [id number]  HPI  Nasal congestion, dry cough w/ wheezing 5 to 7 days. Patient denies any fevers, chills, SOB, difficulty breathing, or body aches.    Review of Systems  All other systems reviewed and are negative.      Objective:   Physical Exam Vitals reviewed.  Constitutional:      General: He is not in acute distress.    Appearance: Normal appearance. He is normal weight. He is not ill-appearing, toxic-appearing or diaphoretic.  HENT:     Head: Normocephalic and atraumatic.  Cardiovascular:     Rate and Rhythm: Normal rate and regular rhythm.     Pulses: Normal pulses.     Heart sounds: Normal heart sounds. No murmur heard. Pulmonary:     Effort: Pulmonary effort is normal. No respiratory distress.     Breath sounds: Wheezing present.     Comments: Expiratory wheezing noted to bilateral lungs Musculoskeletal:     Cervical back: Normal range of motion and neck supple. No rigidity or tenderness.     Comments: Grossly intact  Lymphadenopathy:     Cervical: No cervical adenopathy.  Skin:    General: Skin is warm.     Capillary Refill: Capillary refill takes less than 2 seconds.  Neurological:     Mental Status: He is alert.     Comments: Grossly intact  Psychiatric:        Mood and Affect: Mood normal.        Behavior: Behavior normal.           Assessment & Plan:   1. Acute cough - DG Chest 2 View - COVID-19, Flu A+B and RSV  2. Pneumonia due to infectious organism, unspecified laterality, unspecified part of lung - CXR clear, (discussed with patient) however due to upcoming surgery and findings on lung exam (wheezing) will cover for potential pneumonia - amoxicillin (AMOXIL) 500 MG tablet; Take 2 tablets (1,000 mg total) by mouth in the morning, at noon, and at bedtime for 5 days.  Dispense: 30 tablet; Refill: 0  - Should start feeling better in 24 to 48 hours. RTC if  symptoms persist   Patient has surgery for next week. Advised that due to patient's cardiac history, to postpone surgery by at least one week to fully recover from possible infectious causes. Patient stated understanding.

## 2022-10-06 NOTE — Telephone Encounter (Signed)
Nurses I am certainly sympathetic to what is going on with Mark [name] I am more than willing to help him but I need to be able to do a brief phone visit with him.  This would allow [place] to make sure we know what we are treating plus also nowadays insurance companies require that we have a visit or phone visit focused on infection symptoms in order to show that the antibiotic was necessary.  So another words please touch base with him let him know that I will call him late on Tuesday afternoon closer to 5 PM and then we can be able to send in the medication.  Certainly if he is available before 5 PM such such as before lunchtime please let me know I will try my best to fit him in at 1 time or the other

## 2022-10-07 NOTE — Telephone Encounter (Signed)
Please send message to patient we will connect with him late this afternoon or tomorrow near lunchtime  Today during lunchtime the schedule is too busy to reach out

## 2022-10-07 NOTE — Pre-Procedure Instructions (Signed)
Surgical Instructions    Your procedure is scheduled on Wednesday December 13.  Report to [facility] Main Entrance "A" at 10:30 A.M., then check in with the Admitting office.  Call this number if you have problems the morning of surgery:  [phone]  If you have any questions prior to your surgery date call [phone]: Open Monday-Friday 8am-4pm  If you experience any cold, Covid, or flu symptoms such as cough, fever, chills, shortness of breath, etc. between now and your scheduled surgery, please notify us at the above number   Patient Instructions   The night before surgery:    No food or drink after midnight.  If you have questions, please contact your surgeon's office.    Take these medications the morning of surgery with A SIP OF WATER:  amoxicillin (AMOXIL)  atorvastatin (LIPITOR)  isosorbide mononitrate (IMDUR)  levothyroxine (SYNTHROID)  metoprolol succinate (TOPROL-XL)  omeprazole (PRILOSEC)  tamsulosin (FLOMAX)   You may take these medications with A SIP OF WATER IF YOU NEED THEM: albuterol (VENTOLIN HFA)  cetirizine (ZYRTEC)  nitroGLYCERIN (NITROSTAT)    Follow your surgeon's instructions on when to stop Aspirin.  If no instructions were given by your surgeon then you will need to call the office to get those instructions.    As of today, STOP taking any (unless otherwise instructed by your surgeon) Aleve, Naproxen, Ibuprofen, Motrin, Advil, Goody's, BC's, all herbal medications, fish oil, and all vitamins.          Do NOT Smoke (Tobacco/Vaping)  24 hours prior to your procedure  If you use a CPAP at night, you may bring your mask for your overnight stay.   Contacts, glasses, hearing aids, dentures or partials may not be worn into surgery, please bring cases for these belongings   For patients admitted to the hospital, discharge time will be determined by your treatment team.   Patients discharged the day of surgery will not be allowed to drive home, and  someone needs to stay with them for 24 hours.   SURGICAL WAITING ROOM VISITATION Patients having surgery or a procedure may have no more than 2 support people in the waiting area - these visitors may rotate.   Children under the age of 18 must have an adult with them who is not the patient. If the patient needs to stay at the hospital during part of their recovery, the visitor guidelines for inpatient rooms apply. Pre-op nurse will coordinate an appropriate time for 1 support person to accompany patient in pre-op.  This support person may not rotate.   Please refer to the [facility] website for the visitor guidelines for Inpatients (after your surgery is over and you are in a regular room).    Special instructions:    Oral Hygiene is also important to reduce your risk of infection.  Remember -  BRUSH YOUR TEETH THE MORNING OF SURGERY WITH YOUR REGULAR TOOTHPASTE   [place]- Preparing For Surgery  Before surgery, you can play an important role. Because skin is not sterile, your skin needs to be as free of germs as possible. You can reduce the number of germs on your skin by washing with CHG (chlorahexidine gluconate) Soap before surgery.  CHG is an antiseptic cleaner which kills germs and bonds with the skin to continue killing germs even after washing.     Please do not use if you have an allergy to CHG or antibacterial soaps. If your skin becomes reddened/irritated stop using the CHG.  Do not shave (including legs and underarms) for at least 48 hours prior to first CHG shower. It is OK to shave your face.  Please follow these instructions carefully.    Shower the NIGHT BEFORE SURGERY and the MORNING OF SURGERY with CHG Soap.  If you chose to wash your hair, wash your hair first as usual with your normal shampoo.  After you shampoo, rinse your hair and body thoroughly to remove the shampoo.   Then [employer] and genitals (private parts) with your normal soap and rinse thoroughly to  remove soap.  After that Use CHG Soap as you would any other liquid soap.  You can apply CHG directly to the skin and wash gently with a scrungie or a clean washcloth.   Apply the CHG Soap to your body ONLY FROM THE NECK DOWN.   Do not use on open wounds or open sores.  Avoid contact with your eyes, ears, mouth and genitals (private parts).  Wash thoroughly, paying special attention to the area where your surgery will be performed.  Thoroughly rinse your body with warm water from the neck down.  DO NOT shower/wash with your normal soap after using and rinsing off the CHG Soap.  Pat yourself dry with a CLEAN TOWEL.  Wear CLEAN PAJAMAS to bed the night before surgery  Place CLEAN SHEETS on your bed the night before your surgery  DO NOT SLEEP WITH PETS.   Day of Surgery:  Take a shower with CHG soap. Wear Clean/Comfortable clothing the morning of surgery Brush your teeth WITH YOUR REGULAR TOOTHPASTE. Do not wear jewelry. Do not wear lotions, powders, cologne or deodorant. Men may shave face and neck. Do not bring valuables to the hospital.  [facility] is not responsible for any belongings or valuables.   If you received a COVID test during your pre-op visit, it is requested that you wear a mask when out in public, stay away from anyone that may not be feeling well, and notify your surgeon if you develop symptoms. If you have been in contact with anyone that has tested positive in the last 10 days, please notify your surgeon.    Please read over the following fact sheets that you were given.

## 2022-10-08 ENCOUNTER — Inpatient Hospital Stay (HOSPITAL_COMMUNITY)
Admission: RE | Admit: 2022-10-08 | Discharge: 2022-10-08 | Disposition: A | Discharge status: Home / Self Care | Payer: Medicare PPO | Source: Ambulatory Visit

## 2022-10-08 LAB — COVID-19, FLU A+B AND RSV
Influenza A, NAA: NOT DETECTED
Influenza B, NAA: NOT DETECTED
RSV, NAA: NOT DETECTED
SARS-CoV-2, NAA: NOT DETECTED

## 2022-10-21 NOTE — Pre-Procedure Instructions (Signed)
Surgical Instructions    Your procedure is scheduled on Wednesday December 13.  Report to [facility] Main Entrance "A" at 10:30 A.M., then check in with the Admitting office.  Call this number if you have problems the morning of surgery:  [phone]  If you have any questions prior to your surgery date call [phone]: Open Monday-Friday 8am-4pm  If you experience any cold, Covid, or flu symptoms such as cough, fever, chills, shortness of breath, etc. between now and your scheduled surgery, please notify us at the above number   Patient Instructions   The night before surgery:    No food or drink after midnight.  If you have questions, please contact your surgeon's office.    Take these medications the morning of surgery with A SIP OF WATER:  atorvastatin (LIPITOR)  isosorbide mononitrate (IMDUR)  levothyroxine (SYNTHROID)  metoprolol succinate (TOPROL-XL)  omeprazole (PRILOSEC)  tamsulosin (FLOMAX)   You may take these medications with A SIP OF WATER IF YOU NEED THEM: albuterol (VENTOLIN HFA)  cetirizine (ZYRTEC)  nitroGLYCERIN (NITROSTAT)    Follow your surgeon's instructions on when to stop Aspirin.  If no instructions were given by your surgeon then you will need to call the office to get those instructions.    As of today, STOP taking any (unless otherwise instructed by your surgeon) Aleve, Naproxen, Ibuprofen, Motrin, Advil, Goody's, BC's, all herbal medications, fish oil, and all vitamins.          Do NOT Smoke (Tobacco/Vaping)  24 hours prior to your procedure  If you use a CPAP at night, you may bring your mask for your overnight stay.   Contacts, glasses, hearing aids, dentures or partials may not be worn into surgery, please bring cases for these belongings   For patients admitted to the hospital, discharge time will be determined by your treatment team.   Patients discharged the day of surgery will not be allowed to drive home, and someone needs to stay  with them for 24 hours.   SURGICAL WAITING ROOM VISITATION Patients having surgery or a procedure may have no more than 2 support people in the waiting area - these visitors may rotate.   Children under the age of 64 must have an adult with them who is not the patient. If the patient needs to stay at the hospital during part of their recovery, the visitor guidelines for inpatient rooms apply. Pre-op nurse will coordinate an appropriate time for 1 support person to accompany patient in pre-op.  This support person may not rotate.   Please refer to the [facility] website for the visitor guidelines for Inpatients (after your surgery is over and you are in a regular room).    Special instructions:    Oral Hygiene is also important to reduce your risk of infection.  Remember -  BRUSH YOUR TEETH THE MORNING OF SURGERY WITH YOUR REGULAR TOOTHPASTE   [place]- Preparing For Surgery  Before surgery, you can play an important role. Because skin is not sterile, your skin needs to be as free of germs as possible. You can reduce the number of germs on your skin by washing with CHG (chlorahexidine gluconate) Soap before surgery.  CHG is an antiseptic cleaner which kills germs and bonds with the skin to continue killing germs even after washing.     Please do not use if you have an allergy to CHG or antibacterial soaps. If your skin becomes reddened/irritated stop using the CHG.  Do not  shave (including legs and underarms) for at least 48 hours prior to first CHG shower. It is OK to shave your face.  Please follow these instructions carefully.    Shower the NIGHT BEFORE SURGERY and the MORNING OF SURGERY with CHG Soap.  If you chose to wash your hair, wash your hair first as usual with your normal shampoo.  After you shampoo, rinse your hair and body thoroughly to remove the shampoo.   Then [employer] and genitals (private parts) with your normal soap and rinse thoroughly to remove soap.  After  that Use CHG Soap as you would any other liquid soap.  You can apply CHG directly to the skin and wash gently with a scrungie or a clean washcloth.   Apply the CHG Soap to your body ONLY FROM THE NECK DOWN.   Do not use on open wounds or open sores.  Avoid contact with your eyes, ears, mouth and genitals (private parts).  Wash thoroughly, paying special attention to the area where your surgery will be performed.  Thoroughly rinse your body with warm water from the neck down.  DO NOT shower/wash with your normal soap after using and rinsing off the CHG Soap.  Pat yourself dry with a CLEAN TOWEL.  Wear CLEAN PAJAMAS to bed the night before surgery  Place CLEAN SHEETS on your bed the night before your surgery  DO NOT SLEEP WITH PETS.   Day of Surgery:  Take a shower with CHG soap. Wear Clean/Comfortable clothing the morning of surgery Brush your teeth WITH YOUR REGULAR TOOTHPASTE. Do not wear jewelry. Do not wear lotions, powders, cologne or deodorant. Men may shave face and neck. Do not bring valuables to the hospital.  [facility] is not responsible for any belongings or valuables.   If you received a COVID test during your pre-op visit, it is requested that you wear a mask when out in public, stay away from anyone that may not be feeling well, and notify your surgeon if you develop symptoms. If you have been in contact with anyone that has tested positive in the last 10 days, please notify your surgeon.    Please read over the following fact sheets that you were given.

## 2022-10-22 ENCOUNTER — Encounter (HOSPITAL_COMMUNITY)
Admission: RE | Admit: 2022-10-22 | Discharge: 2022-10-22 | Disposition: A | Discharge status: Home / Self Care | Payer: Medicare PPO | Source: Ambulatory Visit | Attending: Neurosurgery | Admitting: Neurosurgery

## 2022-10-22 ENCOUNTER — Other Ambulatory Visit: Payer: Self-pay

## 2022-10-22 VITALS — BP 119/77 | HR 72 | Temp 97.6°F | Resp 18 | Ht 71.0 in | Wt 203.9 lb

## 2022-10-22 DIAGNOSIS — Z01818 Encounter for other preprocedural examination: Secondary | ICD-10-CM

## 2022-10-22 DIAGNOSIS — Z01812 Encounter for preprocedural laboratory examination: Secondary | ICD-10-CM | POA: Insufficient documentation

## 2022-10-22 DIAGNOSIS — M5412 Radiculopathy, cervical region: Secondary | ICD-10-CM | POA: Diagnosis not present

## 2022-10-22 LAB — BASIC METABOLIC PANEL
Anion gap: 7 (ref 5–15)
BUN: 15 mg/dL (ref 8–23)
CO2: 26 mmol/L (ref 22–32)
Calcium: 9.3 mg/dL (ref 8.9–10.3)
Chloride: 104 mmol/L (ref 98–111)
Creatinine, Ser: 1.02 mg/dL (ref 0.61–1.24)
GFR, Estimated: >60 mL/min (ref >60)
Glucose, Bld: 124 mg/dL — ABNORMAL HIGH (ref 70–99)
Potassium: 4.3 mmol/L (ref 3.5–5.1)
Sodium: 137 mmol/L (ref 135–145)

## 2022-10-22 LAB — CBC
HCT: 40.6 % (ref 39.0–52.0)
Hemoglobin: 13.7 g/dL (ref 13.0–17.0)
MCH: 31.4 pg (ref 26.0–34.0)
MCHC: 33.7 g/dL (ref 30.0–36.0)
MCV: 93.1 fL (ref 80.0–100.0)
Platelets: 177 10*3/uL (ref 150–400)
RBC: 4.36 MIL/uL (ref 4.22–5.81)
RDW: 12.2 % (ref 11.5–15.5)
WBC: 5.2 10*3/uL (ref 4.0–10.5)
nRBC: 0 % (ref 0.0–0.2)

## 2022-10-22 LAB — SURGICAL PCR SCREEN
MRSA, PCR: NEGATIVE
Staphylococcus aureus: NEGATIVE

## 2022-10-22 NOTE — Progress Notes (Signed)
PCP - Lilyan Punt MD Cardiologist - Alverda Skeans MD  PPM/ICD - denies Device Orders -  Rep Notified -   Chest x-ray -  EKG - 09/25/22 Stress Test - 06/04/18 ECHO - 06/04/18 Cardiac Cath - 06/07/18  Sleep Study - 06/01/2013 CPAP - yes  Fasting Blood Sugar - na Checks Blood Sugar _____ times a day  Last dose of GLP1 agonist-  na GLP1 instructions:   Blood Thinner Instructions:na Aspirin Instructions:pt stated his last dose of aspirin was 12/4 per his surgeon's instructions.  ERAS Protcol -no PRE-SURGERY Ensure or G2-   COVID TEST- na   Anesthesia review: yes-cardiac history - clearance 09/30/22  Patient denies shortness of breath, fever, cough and chest pain at PAT appointment   All instructions explained to the patient, with a verbal understanding of the material. Patient agrees to go over the instructions while at home for a better understanding. Patient also instructed to wear a mask when out in public prior to surgery. . The opportunity to ask questions was provided.

## 2022-10-23 NOTE — Anesthesia Preprocedure Evaluation (Addendum)
Anesthesia Evaluation  Patient identified by MRN, date of birth, ID band Patient awake    Reviewed: Allergy & Precautions, NPO status , Patient's Chart, lab work & pertinent test results  History of Anesthesia Complications Negative for: history of anesthetic complications  Airway Mallampati: II  TM Distance: >3 FB Neck ROM: Full    Dental  (+) Dental Advisory Given   Pulmonary sleep apnea and Continuous Positive Airway Pressure Ventilation , Recent URI , Residual Cough, former smoker   Pulmonary exam normal        Cardiovascular METS: 5 - 7 Mets hypertension, + angina  + CAD and + CABG (2005)  Normal cardiovascular exam  Cath 2019 showed 100% CTO of LAD that fills via LIMA with moderate (FFR negative) proximal LM and proximal OM1, preserved EF with normal wall motion.  TTE 2019: EF 60-65%, mild LVH   Neuro/Psych negative neurological ROS     GI/Hepatic Neg liver ROS,GERD  ,,  Endo/Other  Hypothyroidism    Renal/GU negative Renal ROS  negative genitourinary   Musculoskeletal negative musculoskeletal ROS (+)    Abdominal   Peds  Hematology negative hematology ROS (+)   Anesthesia Other Findings   Reproductive/Obstetrics                             Anesthesia Physical Anesthesia Plan  ASA: 3  Anesthesia Plan: General   Post-op Pain Management: Tylenol PO (pre-op)* and Toradol IV (intra-op)*   Induction: Intravenous  PONV Risk Score and Plan: 2 and Ondansetron, Dexamethasone, Treatment may vary due to age or medical condition and Midazolam  Airway Management Planned: Oral ETT  Additional Equipment: None  Intra-op Plan:   Post-operative Plan: Extubation in OR  Informed Consent: I have reviewed the patients History and Physical, chart, labs and discussed the procedure including the risks, benefits and alternatives for the proposed anesthesia with the patient or authorized  representative who has indicated his/her understanding and acceptance.     Dental advisory given  Plan Discussed with:   Anesthesia Plan Comments: (Patient had URI 2-3 weeks ago that has resolved other than occasional residual cough. I warned patient that cough could worsen after general anesthesia and made Dr. Wynetta Emery aware. Decision between surgeon and patient to proceed as planned.  PAT note by Antionette Poles, PA-C: Follows with cardiology for history of chronic stable angina, hypertension, hyperlipidemia, CAD status post CABG in 2005.  Cath 2019 showed 100% CTO of LAD that fills via LIMA with moderate (FFR negative) proximal LM and proximal OM1, preserved EF with normal wall motion.  Seen by Boris Lown, PA-C via televisit 09/30/2022 for preop evaluation.  Per note, "Mr.Cleek's perioperative risk of a major cardiac event is6.6% according to the Revised Cardiac Risk Index (RCRI). Therefore, heis athighrisk for perioperative complications. Hisfunctional capacity is goodat 5.62METs according to the Duke Activity Status Index (DASI). Recommendations: According to ACC/AHA guidelines, no further cardiovascular testing needed. The patient may proceed to surgery at acceptable risk. Antiplatelet and/or Anticoagulation Recommendations: Aspirin can be held for7days prior to hissurgery. Please resume Aspirin post operatively when it is felt to be safe from a bleeding standpoint."  OSA on CPAP.  Preop labs reviewed, WNL.  EKG 09/21/2022: NSR.  Rate 66.  Cath 06/07/2018: ? The left ventricular systolic function is normal. ? LV end diastolic pressure is normal. ? There is no aortic valve stenosis. ? Ost LM to Mid LM lesion is 45% stenosed. FFR 0.92 ?  Ost 1st Mrg lesion is 40% stenosed. Combined FFR 0.89 ? Ost LAD to Prox LAD lesion is 100% stenosed. ? LIMA-LAD graft was visualized by angiography and is large. The graft exhibits no disease. ? Retrograde filling of LAD to occludion site -  moderate 1st Diag & small 2nd Diag; Antegrade filling of LAD reaching the inferoapex - minimal disease. ? Prox LAD to Mid LAD lesion is 35% stenosed.  Severe 1 V CAD - 100% CTO of LAD that fills via LIMA Moderate (FFR Negative) Prox LM & Prox OM1.  Preserved EF with normal wall motion..  Consider Non-Cardiac Etiology for Chest Pain & False + Nuclear Stress Test.  Recommend Aspirin 81mg  daily for occlusive CAD.  TTE 06/04/2018: - Left ventricle: The cavity size was normal. Wall thickness was  increased in a pattern of mild LVH. Systolic function was normal.  The estimated ejection fraction was in the range of 60% to 65%.  Wall motion was normal; there were no regional wall motion  abnormalities. Left ventricular diastolic function parameters  were normal.  - Aortic valve: Mildly calcified annulus. Trileaflet; normal  thickness leaflets. Valve area (VTI): 3.64 cm^2. Valve area  (Vmax): 3.36 cm^2.   )        Anesthesia Quick Evaluation

## 2022-10-23 NOTE — Progress Notes (Signed)
Anesthesia Chart Review:  Follows with cardiology for history of chronic stable angina, hypertension, hyperlipidemia, CAD status post CABG in 2005.  Cath 2019 showed 100% CTO of LAD that fills via LIMA with moderate (FFR negative) proximal LM and proximal OM1, preserved EF with normal wall motion.  Seen by Boris Lown, PA-C via televisit 09/30/2022 for preop evaluation.  Per note, "Mr. Bosch's perioperative risk of a major cardiac event is 6.6% according to the Revised Cardiac Risk Index (RCRI).  Therefore, he is at high risk for perioperative complications.   His functional capacity is good at 5.62 METs according to the Duke Activity Status Index (DASI). Recommendations: According to ACC/AHA guidelines, no further cardiovascular testing needed.  The patient may proceed to surgery at acceptable risk. Antiplatelet and/or Anticoagulation Recommendations: Aspirin can be held for 7 days prior to his surgery.  Please resume Aspirin post operatively when it is felt to be safe from a bleeding standpoint."  OSA on CPAP.  Preop labs reviewed, WNL.  EKG 09/21/2022: NSR.  Rate 66.  Cath 06/07/2018: The left ventricular systolic function is normal. LV end diastolic pressure is normal. There is no aortic valve stenosis. Ost LM to Mid LM lesion is 45% stenosed. FFR 0.92 Ost 1st Mrg lesion is 40% stenosed. Combined FFR 0.89 Ost LAD to Prox LAD lesion is 100% stenosed. LIMA-LAD graft was visualized by angiography and is large. The graft exhibits no disease. Retrograde filling of LAD to occludion site - moderate 1st Diag & small 2nd Diag; Antegrade filling of LAD reaching the inferoapex - minimal disease. Prox LAD to Mid LAD lesion is 35% stenosed.   Severe 1 V CAD - 100% CTO of LAD that fills via LIMA Moderate (FFR Negative) Prox LM & Prox OM1.   Preserved EF with normal wall motion..   Consider Non-Cardiac Etiology for Chest Pain & False + Nuclear Stress Test.   Recommend Aspirin 81mg  daily for  occlusive CAD.  TTE 06/04/2018: - Left ventricle: The cavity size was normal. Wall thickness was    increased in a pattern of mild LVH. Systolic function was normal.    The estimated ejection fraction was in the range of 60% to 65%.    Wall motion was normal; there were no regional wall motion    abnormalities. Left ventricular diastolic function parameters    were normal.  - Aortic valve: Mildly calcified annulus. Trileaflet; normal    thickness leaflets. Valve area (VTI): 3.64 cm^2. Valve area    (Vmax): 3.36 cm^2.    06/06/2018 [facility]/Anesthesiology Phone [phone] 10/23/2022 3:31 PM

## 2022-10-29 ENCOUNTER — Ambulatory Visit (HOSPITAL_COMMUNITY)
Admission: RE | Admit: 2022-10-29 | Discharge: 2022-10-30 | Disposition: A | Discharge status: Home / Self Care | Payer: Medicare PPO | Source: Ambulatory Visit | Attending: Neurosurgery | Admitting: Neurosurgery

## 2022-10-29 ENCOUNTER — Ambulatory Visit (HOSPITAL_BASED_OUTPATIENT_CLINIC_OR_DEPARTMENT_OTHER): Payer: Medicare PPO | Admitting: Physician Assistant

## 2022-10-29 ENCOUNTER — Ambulatory Visit (HOSPITAL_COMMUNITY): Payer: Medicare PPO

## 2022-10-29 ENCOUNTER — Other Ambulatory Visit: Payer: Self-pay

## 2022-10-29 ENCOUNTER — Encounter (HOSPITAL_COMMUNITY): Payer: Self-pay | Admitting: Neurosurgery

## 2022-10-29 ENCOUNTER — Ambulatory Visit (HOSPITAL_COMMUNITY)
Admission: RE | Disposition: A | Discharge status: Home / Self Care | Payer: Self-pay | Source: Ambulatory Visit | Attending: Neurosurgery

## 2022-10-29 ENCOUNTER — Ambulatory Visit (HOSPITAL_COMMUNITY): Payer: Medicare PPO | Admitting: Vascular Surgery

## 2022-10-29 DIAGNOSIS — G629 Polyneuropathy, unspecified: Secondary | ICD-10-CM | POA: Insufficient documentation

## 2022-10-29 DIAGNOSIS — I25119 Atherosclerotic heart disease of native coronary artery with unspecified angina pectoris: Secondary | ICD-10-CM | POA: Diagnosis not present

## 2022-10-29 DIAGNOSIS — Z87891 Personal history of nicotine dependence: Secondary | ICD-10-CM | POA: Diagnosis not present

## 2022-10-29 DIAGNOSIS — E039 Hypothyroidism, unspecified: Secondary | ICD-10-CM | POA: Insufficient documentation

## 2022-10-29 DIAGNOSIS — G4733 Obstructive sleep apnea (adult) (pediatric): Secondary | ICD-10-CM | POA: Diagnosis not present

## 2022-10-29 DIAGNOSIS — M47812 Spondylosis without myelopathy or radiculopathy, cervical region: Secondary | ICD-10-CM | POA: Diagnosis not present

## 2022-10-29 DIAGNOSIS — I10 Essential (primary) hypertension: Secondary | ICD-10-CM | POA: Insufficient documentation

## 2022-10-29 DIAGNOSIS — M4722 Other spondylosis with radiculopathy, cervical region: Secondary | ICD-10-CM | POA: Diagnosis not present

## 2022-10-29 DIAGNOSIS — M4802 Spinal stenosis, cervical region: Secondary | ICD-10-CM | POA: Diagnosis not present

## 2022-10-29 DIAGNOSIS — K219 Gastro-esophageal reflux disease without esophagitis: Secondary | ICD-10-CM | POA: Insufficient documentation

## 2022-10-29 DIAGNOSIS — E785 Hyperlipidemia, unspecified: Secondary | ICD-10-CM | POA: Diagnosis not present

## 2022-10-29 DIAGNOSIS — I25118 Atherosclerotic heart disease of native coronary artery with other forms of angina pectoris: Secondary | ICD-10-CM | POA: Diagnosis not present

## 2022-10-29 DIAGNOSIS — Z951 Presence of aortocoronary bypass graft: Secondary | ICD-10-CM | POA: Diagnosis not present

## 2022-10-29 DIAGNOSIS — Z981 Arthrodesis status: Secondary | ICD-10-CM | POA: Diagnosis not present

## 2022-10-29 HISTORY — PX: ANTERIOR CERVICAL DECOMP/DISCECTOMY FUSION: SHX1161

## 2022-10-29 SURGERY — ANTERIOR CERVICAL DECOMPRESSION/DISCECTOMY FUSION 1 LEVEL
Anesthesia: General | Site: Spine Cervical

## 2022-10-29 MED ORDER — LOSARTAN POTASSIUM 50 MG PO TABS
50.0000 mg | ORAL_TABLET | Freq: Every day | ORAL | Status: DC
  Filled 2022-10-29: qty 1

## 2022-10-29 MED ORDER — DEXAMETHASONE SODIUM PHOSPHATE 10 MG/ML IJ SOLN
INTRAMUSCULAR | Status: AC
  Filled 2022-10-29: qty 1

## 2022-10-29 MED ORDER — ATORVASTATIN CALCIUM 80 MG PO TABS
80.0000 mg | ORAL_TABLET | Freq: Every day | ORAL | Status: DC
  Administered 2022-10-29: 80 mg via ORAL
  Filled 2022-10-29: qty 1

## 2022-10-29 MED ORDER — KETOROLAC TROMETHAMINE 30 MG/ML IJ SOLN
INTRAMUSCULAR | Status: AC
  Filled 2022-10-29: qty 1

## 2022-10-29 MED ORDER — FENTANYL CITRATE (PF) 100 MCG/2ML IJ SOLN
25.0000 ug | INTRAMUSCULAR | Status: DC | PRN
  Administered 2022-10-29 (×2): 25 ug via INTRAVENOUS
  Administered 2022-10-29 (×2): 50 ug via INTRAVENOUS

## 2022-10-29 MED ORDER — CHLORHEXIDINE GLUCONATE CLOTH 2 % EX PADS: 6.0000 | MEDICATED_PAD | Freq: Once | CUTANEOUS | Status: DC

## 2022-10-29 MED ORDER — ONDANSETRON HCL 4 MG PO TABS: 4.0000 mg | ORAL_TABLET | Freq: Four times a day (QID) | ORAL | Status: DC | PRN

## 2022-10-29 MED ORDER — FENTANYL CITRATE (PF) 100 MCG/2ML IJ SOLN
INTRAMUSCULAR | Status: AC
  Filled 2022-10-29: qty 2

## 2022-10-29 MED ORDER — TRIAMCINOLONE ACETONIDE 0.1 % EX CREA: 1.0000 | TOPICAL_CREAM | Freq: Two times a day (BID) | CUTANEOUS | Status: DC | PRN

## 2022-10-29 MED ORDER — PSYLLIUM 95 % PO PACK
1.0000 | PACK | Freq: Every day | ORAL | Status: DC
  Filled 2022-10-29 (×2): qty 1

## 2022-10-29 MED ORDER — NITROGLYCERIN 0.4 MG SL SUBL: 0.4000 mg | SUBLINGUAL_TABLET | SUBLINGUAL | Status: DC | PRN

## 2022-10-29 MED ORDER — PROPOFOL 10 MG/ML IV BOLUS
INTRAVENOUS | Status: DC | PRN
  Administered 2022-10-29: 100 mg via INTRAVENOUS

## 2022-10-29 MED ORDER — ACETAMINOPHEN 500 MG PO TABS
1000.0000 mg | ORAL_TABLET | Freq: Once | ORAL | Status: AC
  Administered 2022-10-29: 1000 mg via ORAL
  Filled 2022-10-29: qty 2

## 2022-10-29 MED ORDER — ISOSORBIDE MONONITRATE ER 60 MG PO TB24
30.0000 mg | ORAL_TABLET | Freq: Every day | ORAL | Status: DC
  Filled 2022-10-29: qty 1

## 2022-10-29 MED ORDER — THROMBIN 5000 UNITS EX SOLR
CUTANEOUS | Status: AC
  Filled 2022-10-29: qty 10000

## 2022-10-29 MED ORDER — ASPIRIN 81 MG PO TBEC
81.0000 mg | DELAYED_RELEASE_TABLET | Freq: Every morning | ORAL | Status: DC
  Filled 2022-10-29: qty 1

## 2022-10-29 MED ORDER — TAMSULOSIN HCL 0.4 MG PO CAPS
0.4000 mg | ORAL_CAPSULE | Freq: Every day | ORAL | Status: DC
  Administered 2022-10-29: 0.4 mg via ORAL
  Filled 2022-10-29: qty 1

## 2022-10-29 MED ORDER — ROCURONIUM BROMIDE 10 MG/ML (PF) SYRINGE
PREFILLED_SYRINGE | INTRAVENOUS | Status: DC | PRN
  Administered 2022-10-29: 70 mg via INTRAVENOUS
  Administered 2022-10-29: 20 mg via INTRAVENOUS

## 2022-10-29 MED ORDER — HYDROMORPHONE HCL 1 MG/ML IJ SOLN
0.2500 mg | INTRAMUSCULAR | Status: DC | PRN
  Administered 2022-10-29: 0.5 mg via INTRAVENOUS

## 2022-10-29 MED ORDER — METOPROLOL SUCCINATE ER 25 MG PO TB24
12.5000 mg | ORAL_TABLET | Freq: Every day | ORAL | Status: DC
  Filled 2022-10-29: qty 1

## 2022-10-29 MED ORDER — THROMBIN 5000 UNITS EX SOLR
CUTANEOUS | Status: AC
  Filled 2022-10-29: qty 5000

## 2022-10-29 MED ORDER — ONDANSETRON HCL 4 MG/2ML IJ SOLN
INTRAMUSCULAR | Status: AC
  Filled 2022-10-29: qty 2

## 2022-10-29 MED ORDER — MENTHOL 3 MG MT LOZG
1.0000 | LOZENGE | OROMUCOSAL | Status: DC | PRN
  Administered 2022-10-30: 3 mg via ORAL
  Filled 2022-10-29: qty 9

## 2022-10-29 MED ORDER — ALBUTEROL SULFATE (2.5 MG/3ML) 0.083% IN NEBU: 2.5000 mL | INHALATION_SOLUTION | RESPIRATORY_TRACT | Status: DC | PRN

## 2022-10-29 MED ORDER — HYDROMORPHONE HCL 1 MG/ML IJ SOLN
INTRAMUSCULAR | Status: AC
  Filled 2022-10-29: qty 1

## 2022-10-29 MED ORDER — ACETAMINOPHEN 325 MG PO TABS: 650.0000 mg | ORAL_TABLET | ORAL | Status: DC | PRN

## 2022-10-29 MED ORDER — FENTANYL CITRATE (PF) 250 MCG/5ML IJ SOLN
INTRAMUSCULAR | Status: DC | PRN
  Administered 2022-10-29 (×5): 50 ug via INTRAVENOUS

## 2022-10-29 MED ORDER — SODIUM CHLORIDE 0.9 % IV SOLN: 250.0000 mL | INTRAVENOUS | Status: DC

## 2022-10-29 MED ORDER — PANTOPRAZOLE SODIUM 40 MG IV SOLR
40.0000 mg | Freq: Every day | INTRAVENOUS | Status: DC
  Administered 2022-10-29: 40 mg via INTRAVENOUS
  Filled 2022-10-29: qty 10

## 2022-10-29 MED ORDER — ACETAMINOPHEN 650 MG RE SUPP: 650.0000 mg | RECTAL | Status: DC | PRN

## 2022-10-29 MED ORDER — DEXAMETHASONE SODIUM PHOSPHATE 10 MG/ML IJ SOLN
INTRAMUSCULAR | Status: DC | PRN
  Administered 2022-10-29: 10 mg via INTRAVENOUS

## 2022-10-29 MED ORDER — OXYCODONE HCL 5 MG/5ML PO SOLN: 5.0000 mg | Freq: Once | ORAL | Status: AC | PRN

## 2022-10-29 MED ORDER — CHLORHEXIDINE GLUCONATE 0.12 % MT SOLN
15.0000 mL | Freq: Once | OROMUCOSAL | Status: AC
  Administered 2022-10-29: 15 mL via OROMUCOSAL
  Filled 2022-10-29: qty 15

## 2022-10-29 MED ORDER — ONDANSETRON HCL 4 MG/2ML IJ SOLN: 4.0000 mg | Freq: Once | INTRAMUSCULAR | Status: DC | PRN

## 2022-10-29 MED ORDER — PANTOPRAZOLE SODIUM 40 MG PO TBEC: 40.0000 mg | DELAYED_RELEASE_TABLET | Freq: Every day | ORAL | Status: DC

## 2022-10-29 MED ORDER — SUGAMMADEX SODIUM 200 MG/2ML IV SOLN
INTRAVENOUS | Status: DC | PRN
  Administered 2022-10-29: 200 mg via INTRAVENOUS

## 2022-10-29 MED ORDER — LACTATED RINGERS IV SOLN: INTRAVENOUS | Status: DC

## 2022-10-29 MED ORDER — ONDANSETRON HCL 4 MG/2ML IJ SOLN
INTRAMUSCULAR | Status: DC | PRN
  Administered 2022-10-29: 4 mg via INTRAVENOUS

## 2022-10-29 MED ORDER — FENTANYL CITRATE (PF) 250 MCG/5ML IJ SOLN
INTRAMUSCULAR | Status: AC
  Filled 2022-10-29: qty 5

## 2022-10-29 MED ORDER — PHENOL 1.4 % MT LIQD: 1.0000 | OROMUCOSAL | Status: DC | PRN

## 2022-10-29 MED ORDER — ALPRAZOLAM 0.25 MG PO TABS: 0.2500 mg | ORAL_TABLET | Freq: Every evening | ORAL | Status: DC | PRN

## 2022-10-29 MED ORDER — AMISULPRIDE (ANTIEMETIC) 5 MG/2ML IV SOLN: 10.0000 mg | Freq: Once | INTRAVENOUS | Status: DC | PRN

## 2022-10-29 MED ORDER — CYCLOBENZAPRINE HCL 10 MG PO TABS
10.0000 mg | ORAL_TABLET | Freq: Three times a day (TID) | ORAL | Status: DC | PRN
  Administered 2022-10-29: 10 mg via ORAL
  Filled 2022-10-29: qty 1

## 2022-10-29 MED ORDER — CEFAZOLIN SODIUM-DEXTROSE 2-4 GM/100ML-% IV SOLN
2.0000 g | INTRAVENOUS | Status: AC
  Administered 2022-10-29: 2 g via INTRAVENOUS
  Filled 2022-10-29: qty 100

## 2022-10-29 MED ORDER — ALUM & MAG HYDROXIDE-SIMETH 200-200-20 MG/5ML PO SUSP
30.0000 mL | Freq: Four times a day (QID) | ORAL | Status: DC | PRN
  Administered 2022-10-29: 30 mL via ORAL
  Filled 2022-10-29: qty 30

## 2022-10-29 MED ORDER — LIDOCAINE 2% (20 MG/ML) 5 ML SYRINGE
INTRAMUSCULAR | Status: AC
  Filled 2022-10-29: qty 10

## 2022-10-29 MED ORDER — ORAL CARE MOUTH RINSE: 15.0000 mL | Freq: Once | OROMUCOSAL | Status: AC

## 2022-10-29 MED ORDER — PHENYLEPHRINE HCL-NACL 20-0.9 MG/250ML-% IV SOLN
INTRAVENOUS | Status: DC | PRN
  Administered 2022-10-29: 20 ug/min via INTRAVENOUS

## 2022-10-29 MED ORDER — HYDROMORPHONE HCL 1 MG/ML IJ SOLN: 0.5000 mg | INTRAMUSCULAR | Status: DC | PRN

## 2022-10-29 MED ORDER — 0.9 % SODIUM CHLORIDE (POUR BTL) OPTIME
TOPICAL | Status: DC | PRN
  Administered 2022-10-29: 1000 mL

## 2022-10-29 MED ORDER — ROCURONIUM BROMIDE 10 MG/ML (PF) SYRINGE
PREFILLED_SYRINGE | INTRAVENOUS | Status: AC
  Filled 2022-10-29: qty 20

## 2022-10-29 MED ORDER — ONDANSETRON HCL 4 MG/2ML IJ SOLN: 4.0000 mg | Freq: Four times a day (QID) | INTRAMUSCULAR | Status: DC | PRN

## 2022-10-29 MED ORDER — OXYCODONE HCL 5 MG PO TABS
5.0000 mg | ORAL_TABLET | Freq: Once | ORAL | Status: AC | PRN
  Administered 2022-10-29: 5 mg via ORAL

## 2022-10-29 MED ORDER — SODIUM CHLORIDE 0.9% FLUSH: 3.0000 mL | INTRAVENOUS | Status: DC | PRN

## 2022-10-29 MED ORDER — APPLE CIDER VINEGAR 500 MG PO TABS: ORAL_TABLET | Freq: Every day | ORAL | Status: DC

## 2022-10-29 MED ORDER — THROMBIN (RECOMBINANT) 5000 UNITS EX SOLR: CUTANEOUS | Status: DC | PRN

## 2022-10-29 MED ORDER — LIDOCAINE 2% (20 MG/ML) 5 ML SYRINGE
INTRAMUSCULAR | Status: DC | PRN
  Administered 2022-10-29: 100 mg via INTRAVENOUS

## 2022-10-29 MED ORDER — LEVOTHYROXINE SODIUM 100 MCG PO TABS
100.0000 ug | ORAL_TABLET | Freq: Every day | ORAL | Status: DC
  Administered 2022-10-30: 100 ug via ORAL
  Filled 2022-10-29: qty 1

## 2022-10-29 MED ORDER — GLYCOPYRROLATE PF 0.2 MG/ML IJ SOSY
PREFILLED_SYRINGE | INTRAMUSCULAR | Status: DC | PRN
  Administered 2022-10-29: .2 mg via INTRAVENOUS

## 2022-10-29 MED ORDER — SAW PALMETTO 80 MG PO CAPS: ORAL_CAPSULE | Freq: Every day | ORAL | Status: DC

## 2022-10-29 MED ORDER — HYDROCODONE-ACETAMINOPHEN 5-325 MG PO TABS
2.0000 | ORAL_TABLET | ORAL | Status: DC | PRN
  Administered 2022-10-29 – 2022-10-30 (×3): 2 via ORAL
  Filled 2022-10-29 (×3): qty 2

## 2022-10-29 MED ORDER — OXYCODONE HCL 5 MG PO TABS
ORAL_TABLET | ORAL | Status: AC
  Filled 2022-10-29: qty 1

## 2022-10-29 MED ORDER — CEFAZOLIN SODIUM-DEXTROSE 2-4 GM/100ML-% IV SOLN
2.0000 g | Freq: Three times a day (TID) | INTRAVENOUS | Status: AC
  Administered 2022-10-29 – 2022-10-30 (×2): 2 g via INTRAVENOUS
  Filled 2022-10-29 (×3): qty 100

## 2022-10-29 MED ORDER — SODIUM CHLORIDE 0.9% FLUSH
3.0000 mL | Freq: Two times a day (BID) | INTRAVENOUS | Status: DC
  Administered 2022-10-29: 3 mL via INTRAVENOUS

## 2022-10-29 MED ORDER — THROMBIN 5000 UNITS EX SOLR: OROMUCOSAL | Status: DC | PRN

## 2022-10-29 MED ORDER — LORATADINE 10 MG PO TABS
10.0000 mg | ORAL_TABLET | Freq: Every day | ORAL | Status: DC
  Filled 2022-10-29: qty 1

## 2022-10-29 SURGICAL SUPPLY — 51 items
BAG COUNTER SPONGE SURGICOUNT (BAG) ×1 IMPLANT
BAND RUBBER #18 3X1/16 STRL (MISCELLANEOUS) ×2 IMPLANT
BASKET BONE COLLECTION (BASKET) ×1 IMPLANT
BENZOIN TINCTURE PRP APPL 2/3 (GAUZE/BANDAGES/DRESSINGS) ×1 IMPLANT
BIT DRILL NEURO 2X3.1 SFT TUCH (MISCELLANEOUS) ×1 IMPLANT
BONE VIVIGEN FORMABLE 1.3CC (Bone Implant) ×1 IMPLANT
BUR MATCHSTICK NEURO 3.0 LAGG (BURR) ×1 IMPLANT
CANISTER SUCT 3000ML PPV (MISCELLANEOUS) ×1 IMPLANT
DERMABOND ADVANCED .7 DNX12 (GAUZE/BANDAGES/DRESSINGS) IMPLANT
DRAPE C-ARM 42X72 X-RAY (DRAPES) ×2 IMPLANT
DRAPE LAPAROTOMY 100X72 PEDS (DRAPES) ×1 IMPLANT
DRAPE MICROSCOPE SLANT 54X150 (MISCELLANEOUS) ×1 IMPLANT
DRILL NEURO 2X3.1 SOFT TOUCH (MISCELLANEOUS) ×1
DRSG OPSITE POSTOP 4X6 (GAUZE/BANDAGES/DRESSINGS) IMPLANT
DURAPREP 6ML APPLICATOR 50/CS (WOUND CARE) ×1 IMPLANT
ELECT COATED BLADE 2.86 ST (ELECTRODE) ×1 IMPLANT
ELECT REM PT RETURN 9FT ADLT (ELECTROSURGICAL) ×1
ELECTRODE REM PT RTRN 9FT ADLT (ELECTROSURGICAL) ×1 IMPLANT
GAUZE 4X4 16PLY ~~LOC~~+RFID DBL (SPONGE) IMPLANT
GAUZE SPONGE 4X4 12PLY STRL (GAUZE/BANDAGES/DRESSINGS) ×1 IMPLANT
GLOVE BIO SURGEON STRL SZ7 (GLOVE) IMPLANT
GLOVE BIO SURGEON STRL SZ8 (GLOVE) ×1 IMPLANT
GLOVE INDICATOR 8.5 STRL (GLOVE) ×2 IMPLANT
GOWN STRL REUS W/ TWL LRG LVL3 (GOWN DISPOSABLE) ×1 IMPLANT
GOWN STRL REUS W/ TWL XL LVL3 (GOWN DISPOSABLE) ×1 IMPLANT
GOWN STRL REUS W/TWL 2XL LVL3 (GOWN DISPOSABLE) ×1 IMPLANT
GOWN STRL REUS W/TWL LRG LVL3 (GOWN DISPOSABLE) ×1
GOWN STRL REUS W/TWL XL LVL3 (GOWN DISPOSABLE) ×1
GRAFT BNE MATRIX VG FRMBL SM 1 (Bone Implant) IMPLANT
HALTER HD/CHIN CERV TRACTION D (MISCELLANEOUS) ×1 IMPLANT
HEMOSTAT POWDER KIT SURGIFOAM (HEMOSTASIS) ×1 IMPLANT
KIT BASIN OR (CUSTOM PROCEDURE TRAY) ×1 IMPLANT
KIT TURNOVER KIT B (KITS) ×1 IMPLANT
NDL SPNL 20GX3.5 QUINCKE YW (NEEDLE) ×1 IMPLANT
NEEDLE SPNL 20GX3.5 QUINCKE YW (NEEDLE) ×1 IMPLANT
NS IRRIG 1000ML POUR BTL (IV SOLUTION) ×1 IMPLANT
PACK LAMINECTOMY NEURO (CUSTOM PROCEDURE TRAY) ×1 IMPLANT
PAD ARMBOARD 7.5X6 YLW CONV (MISCELLANEOUS) ×3 IMPLANT
PLATE CERV RES ACP 14 1L (Plate) IMPLANT
SCREW VA SD 4.2X16 (Screw) IMPLANT
SCREW VA SD RESONATE 4.2X14 (Screw) IMPLANT
SPACER HEDRON 14X16X7 0D (Spacer) IMPLANT
SPONGE INTESTINAL PEANUT (DISPOSABLE) ×1 IMPLANT
SPONGE SURGIFOAM ABS GEL SZ50 (HEMOSTASIS) ×1 IMPLANT
STRIP CLOSURE SKIN 1/2X4 (GAUZE/BANDAGES/DRESSINGS) ×1 IMPLANT
SUT VIC AB 3-0 SH 8-18 (SUTURE) ×1 IMPLANT
SUT VICRYL 4-0 PS2 18IN ABS (SUTURE) ×1 IMPLANT
TAPE CLOTH 4X10 WHT NS (GAUZE/BANDAGES/DRESSINGS) ×1 IMPLANT
TOWEL GREEN STERILE (TOWEL DISPOSABLE) ×1 IMPLANT
TOWEL GREEN STERILE FF (TOWEL DISPOSABLE) ×1 IMPLANT
WATER STERILE IRR 1000ML POUR (IV SOLUTION) ×1 IMPLANT

## 2022-10-29 NOTE — Anesthesia Procedure Notes (Addendum)
Procedure Name: Intubation Date/Time: 10/29/2022 12:18 PM  Performed by: Colon Flattery, CRNAPre-anesthesia Checklist: Patient identified, Emergency Drugs available, Suction available and Patient being monitored Patient Re-evaluated:Patient Re-evaluated prior to induction Oxygen Delivery Method: Circle system utilized Preoxygenation: Pre-oxygenation with 100% oxygen Induction Type: IV induction Ventilation: Mask ventilation without difficulty and Oral airway inserted - appropriate to patient size Laryngoscope Size: Glidescope and 4 Grade View: Grade I Tube type: Oral Tube size: 7.5 mm Number of attempts: 1 Airway Equipment and Method: Stylet and Oral airway Placement Confirmation: ETT inserted through vocal cords under direct vision, positive ETCO2 and breath sounds checked- equal and bilateral Secured at: 22 cm Tube secured with: Tape Dental Injury: Teeth and Oropharynx as per pre-operative assessment  Comments: Elective glidescope d/t cervical neuropathies

## 2022-10-29 NOTE — Anesthesia Postprocedure Evaluation (Signed)
Anesthesia Post Note  Patient: PIETRO BONURA  Procedure(s) Performed: CERVICAL FIVE-SIX ANTERIOR CERVICAL DECOMPRESSION/DISCECTOMY FUSION (Spine Cervical)     Patient location during evaluation: PACU Anesthesia Type: General Level of consciousness: awake and alert Pain management: pain level controlled Vital Signs Assessment: post-procedure vital signs reviewed and stable Respiratory status: spontaneous breathing, nonlabored ventilation and respiratory function stable Cardiovascular status: blood pressure returned to baseline and stable Postop Assessment: no apparent nausea or vomiting Anesthetic complications: no   No notable events documented.  Last Vitals:  Vitals:   10/29/22 1515 10/29/22 1624  BP: (!) 160/86 (!) 159/91  Pulse: 61 63  Resp: 11 18  Temp:  36.6 C  SpO2: 96% 96%    Last Pain:  Vitals:   10/29/22 1500  TempSrc:   PainSc: 8                  Lucretia Kern

## 2022-10-29 NOTE — Transfer of Care (Signed)
Immediate Anesthesia Transfer of Care Note  Patient: Mark [name]  Procedure(s) Performed: CERVICAL FIVE-SIX ANTERIOR CERVICAL DECOMPRESSION/DISCECTOMY FUSION (Spine Cervical)  Patient Location: PACU  Anesthesia Type:General  Level of Consciousness: drowsy, patient cooperative, and responds to stimulation  Airway & Oxygen Therapy: Patient Spontanous Breathing  Post-op Assessment: Report given to RN and Post -op Vital signs reviewed and stable  Post vital signs: Reviewed and stable  Last Vitals:  Vitals Value Taken Time  BP 156/73 10/29/22 1401  Temp    Pulse 76 10/29/22 1404  Resp 17 10/29/22 1404  SpO2 93 % 10/29/22 1404  Vitals shown include unvalidated device data.  Last Pain:  Vitals:   10/29/22 1055  TempSrc:   PainSc: 0-No pain         Complications: No notable events documented.

## 2022-10-29 NOTE — Op Note (Signed)
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  Postoperative diagnosis: Same.  Procedure: Anterior discectomy and fusion C5-6 utilizing globus titanium cage packed with locally harvested autograft mixed with Vivigen and anterior cervical plating utilizing the globus resonate plating system.  Surgeon: Donalee Citrin.  Assistant: Lisbeth Renshaw.  Anesthesia: General.  EBL: Minimal.  HPI: 71 year old gentleman with neck pain right arm pain radiating C6 distribution workup revealed severe foraminal stenosis and spondylosis at C5-6.  Due to patient's progression of clinical syndrome imaging findings of a conservative treatment I recommended anterior cervical discectomy and fusion at C5-6 I extensively reviewed the risks and benefits perioperative course expectations of outcome and alternatives of surgery and he understood and agreed to proceed forward.  Operative procedure: Patient was brought into the OR was induced under general anesthesia positioned supine the neck in slight extension 5 pounds halter traction.  The right side of his neck was prepped and draped in routine sterile fashion.  Preoperative x-ray localized the appropriate level.  A curvilinear incision was made just off the midline to the entry border of the sternocleidomastoid and the superficial abscess was dissected out divided longitudinally.  The avascular plane between the sternocleidomastoid and strap muscle was developed down to the prevertebral fascia and prevertebral fascia was dissected away with Kitners.  Intraoperative x-ray confirmed application appropriate level so annulotomy was made with 15 blade scalpel to mark the disc base longus goes reflected laterally and self-retaining retractors were placed.  Anterior osteophytes were bitten off a Leksell rongeur and a 3 mm Kerrison punch.  Disc base was scraped and drilled down to the posterior annulus and osteophytic complex capturing  the bone shavings and mucus trap.  Under microscopic lamination aggressive under biting of the endplates and identification of posterior large ligament with removal in piecemeal fashion decompress the central canal.  Marching laterally first I decompressed the left-sided C6 nerve root flush with the C6 pedicle and marching to the right marked uncinate hypertrophy was causing severe compression of that nerve root this was all removed skeletonizing and decompression the right C6 nerve root flush with the right C6 pedicle.  There was no further stenosis either foraminally or centrally when I was done with the discectomy.  I then sized up a 7 mm parallel titanium cage packed with locally harvested autograft mixed with Vivigen and inserted it 2 mm deep to the anterior vertebral line.  Then I selected a 14 mm globus resonate plate all screws had excellent purchase locking mechanisms were engaged postop imaging confirmed good position of the implants.  The wound was then copiously again meticulous hemostasis was maintained prior to plate placement I placed some additional autograft mix laterally to the cage and anterior the cage underneath the plate then closed the wound in layers with active Vicryl running or four 4-0 subcuticular Dermabond benzoin Steri-Strips and a sterile dressing was applied patient recovery in stable condition.  At the end the case all needle counts and sponge counts were correct.

## 2022-10-29 NOTE — H&P (Signed)
Mark [name] is an 71 y.o. male.   Chief Complaint: Neck and right arm pain HPI: 71 year old with progressive worsening neck right shoulder and arm pain rating down the C6 nerve root pattern workup revealed severe cervical spondylosis C5-6 with severe foraminal stenosis C6 nerve root and facet arthropathy.  Due to patient progression of clinical syndrome imaging findings of a conservative treatment I recommended anterior cervical discectomy and fusion at C5-6.  I extensively reviewed the risks and benefits of the operation with him as well as perioperative course expectations of outcome and alternatives to surgery and he understood and agreed to proceed forward.  Past Medical History:  Diagnosis Date   Abnormal nuclear stress test false positive 05/2018   BPH (benign prostatic hyperplasia) 04/21/2019   Chest pain stable CAD with cath 06/07/18 possible GI 06/03/2018   Coronary artery disease    a. s/p CABG 2005. b. cath 05/2018 - patent LIMA-LAD, moderate LM and prox OM disease with neg FFR.   Dyslipidemia    Esophageal stricture    ESOPHAGEAL STRICTURE 12/18/2008   Qualifier: History of  By: Candice Camp CMA (AAMA), Dottie     ESOPHAGITIS 12/18/2008   Qualifier: History of  By: Candice Camp CMA (AAMA), Dottie     Essential hypertension, benign 04/06/2013   Gastrointestinal bleeding    GERD (gastroesophageal reflux disease)    History of colonic polyps 08/26/2014   [facility] [place] during colonoscopy in May of 2015. They recommend repeat colonoscopy May of 2017 do to inadequate prep, pathology showed hyperplastic polyp    Hyperlipidemia    Hypothyroidism    Insomnia 04/21/2019   Obstructive sleep apnea 07/11/2013   Has mixed apnea, CPAP setting 7    OSA (obstructive sleep apnea)    RECTAL BLEEDING 12/18/2008   Qualifier: Diagnosis of  By: Candice Camp CMA (AAMA), Dottie     Urinary retention    Vitiligo 07/11/2013    Past Surgical History:   Procedure Laterality Date   APPENDECTOMY     bilateral inguinal hernia repair     CARDIAC CATHETERIZATION  12/2005, 06/2005   CHOLECYSTECTOMY N/A 08/04/2022   Procedure: LAPAROSCOPIC CHOLECYSTECTOMY;  Surgeon: Lewie Chamber, DO;  Location: [facility];  Service: General;  Laterality: N/A;   COLONOSCOPY     CORONARY ARTERY BYPASS GRAFT  2006   LIMA to LAD, off pump   INTRAVASCULAR PRESSURE WIRE/FFR STUDY N/A 06/07/2018   Procedure: INTRAVASCULAR PRESSURE WIRE/FFR STUDY;  Surgeon: Marykay Lex, MD;  Location: [facility] INVASIVE CV LAB;  Service: Cardiovascular;  Laterality: N/A;   LEFT HEART CATH AND CORS/GRAFTS ANGIOGRAPHY N/A 06/07/2018   Procedure: LEFT HEART CATH AND CORS/GRAFTS ANGIOGRAPHY;  Surgeon: Marykay Lex, MD;  [facility]: [facility] INVASIVE CV LAB;  Service: Cardiovascular;  Laterality: N/A;   OPEN REDUCTION INTERNAL FIXATION (ORIF) HAND Left 07/21/2015   Procedure: IRRIGATION AND DEBRIDMENT LEFT INDEX AND MIDDLE FINGER, OPEN REDUCTION INTERNAL FIXATION LEFT INDEX FINGER.;  Surgeon: Dominica Severin, MD;  Location: [facility] OR;  Service: Orthopedics;  Laterality: Left;    Family History  Problem Relation Age of Onset   Heart disease Father    Hypertension Father    Social History:  reports that he quit smoking about 43 years ago. His smoking use included cigarettes. He has a 40.00 pack-year smoking history. He quit smokeless tobacco use about 27 years ago.  His smokeless tobacco use included chew. He reports current alcohol use. He reports that he does not use drugs.  Allergies:  Allergies  Allergen Reactions   Mobic [Meloxicam] Rash    Medications Prior to Admission  Medication Sig Dispense Refill   albuterol (VENTOLIN HFA) 108 (90 Base) MCG/ACT inhaler TAKE 2 PUFFS BY MOUTH EVERY 6 HOURS AS NEEDED FOR WHEEZE OR SHORTNESS OF BREATH 18 each 3   ALPRAZolam (XANAX) 0.25 MG tablet TAKE 1 TABLET (0.25 MG TOTAL) BY MOUTH AT BEDTIME AS NEEDED 30 tablet 2   APPLE CIDER VINEGAR PO Take 1 each  by mouth daily. Gummies     aspirin 81 MG tablet Take 1 tablet (81 mg total) by mouth every morning. 30 tablet    atorvastatin (LIPITOR) 80 MG tablet Take 1 tablet (80 mg total) by mouth daily. 90 tablet 1   augmented betamethasone dipropionate (DIPROLENE-AF) 0.05 % cream Apply 1 Application topically 2 (two) times daily as needed (irritation).     cetirizine (ZYRTEC) 10 MG tablet Take 5 mg by mouth daily as needed for allergies.     isosorbide mononitrate (IMDUR) 30 MG 24 hr tablet TAKE 1 TABLET BY MOUTH EVERY DAY 90 tablet 2   levothyroxine (SYNTHROID) 100 MCG tablet Take 1 tablet (100 mcg total) by mouth daily. 90 tablet 1   losartan (COZAAR) 50 MG tablet TAKE 1 TABLET BY MOUTH EVERY DAY 90 tablet 1   metoprolol succinate (TOPROL-XL) 25 MG 24 hr tablet Take 0.5 tablets (12.5 mg total) by mouth daily. 45 tablet 1   naproxen (NAPROSYN) 500 MG tablet Take 1 tablet (500 mg total) by mouth daily. (Patient taking differently: Take 500 mg by mouth 2 (two) times daily with a meal.)     NON FORMULARY Pt uses a cpap nightly     omeprazole (PRILOSEC) 20 MG capsule TAKE 1 CAPSULE BY MOUTH EVERY DAY 90 capsule 1   psyllium (HYDROCIL/METAMUCIL) 95 % PACK Take 1 packet by mouth daily.     Saw Palmetto, Serenoa repens, (SAW PALMETTO PO) Take 1 capsule by mouth daily.     tamsulosin (FLOMAX) 0.4 MG CAPS capsule Take 1 capsule (0.4 mg total) by mouth daily. 90 capsule 1   triamcinolone cream (KENALOG) 0.1 % Apply 1 application topically 2 (two) times daily. (Patient taking differently: Apply 1 application  topically 2 (two) times daily as needed (irritation).) 45 g 4   nitroGLYCERIN (NITROSTAT) 0.4 MG SL tablet Place 0.4 mg under the tongue every 5 (five) minutes as needed for chest pain.      No results found for this or any previous visit (from the past 48 hour(s)). No results found.  Review of Systems  Musculoskeletal:  Positive for neck pain.  Neurological:  Positive for numbness.    Blood pressure  (!) 148/94, pulse 68, temperature 98 F (36.7 C), temperature source Oral, resp. rate 18, height 5\' 11"  (1.803 m), weight 93 kg, SpO2 97 %. Physical Exam HENT:     Head: Normocephalic.     Nose: Nose normal.     Mouth/Throat:     Mouth: Mucous membranes are moist.  Cardiovascular:     Rate and Rhythm: Normal rate.  Pulmonary:     Effort: Pulmonary effort is normal.  Abdominal:     General: Abdomen is flat.  Musculoskeletal:        General: Normal range of motion.  Skin:    General: Skin is warm.  Neurological:     General: No focal deficit present.     Mental Status: He is alert.     Comments: Right is 5 out of  5 deltoid, bicep, tricep, wrist flexion, wrist extension, hand intrinsics.      Assessment/Plan 71 year old presents for ACDF C5-6  Mariam Dollar, MD 10/29/2022, 11:17 AM

## 2022-10-30 ENCOUNTER — Encounter (HOSPITAL_COMMUNITY): Payer: Self-pay | Admitting: Neurosurgery

## 2022-10-30 DIAGNOSIS — M47812 Spondylosis without myelopathy or radiculopathy, cervical region: Secondary | ICD-10-CM | POA: Diagnosis not present

## 2022-10-30 DIAGNOSIS — M4802 Spinal stenosis, cervical region: Secondary | ICD-10-CM | POA: Diagnosis not present

## 2022-10-30 DIAGNOSIS — I10 Essential (primary) hypertension: Secondary | ICD-10-CM | POA: Diagnosis not present

## 2022-10-30 DIAGNOSIS — Z951 Presence of aortocoronary bypass graft: Secondary | ICD-10-CM | POA: Diagnosis not present

## 2022-10-30 DIAGNOSIS — Z87891 Personal history of nicotine dependence: Secondary | ICD-10-CM | POA: Diagnosis not present

## 2022-10-30 DIAGNOSIS — G4733 Obstructive sleep apnea (adult) (pediatric): Secondary | ICD-10-CM | POA: Diagnosis not present

## 2022-10-30 DIAGNOSIS — I25118 Atherosclerotic heart disease of native coronary artery with other forms of angina pectoris: Secondary | ICD-10-CM | POA: Diagnosis not present

## 2022-10-30 DIAGNOSIS — G629 Polyneuropathy, unspecified: Secondary | ICD-10-CM | POA: Diagnosis not present

## 2022-10-30 DIAGNOSIS — E039 Hypothyroidism, unspecified: Secondary | ICD-10-CM | POA: Diagnosis not present

## 2022-10-30 MED ORDER — PANTOPRAZOLE SODIUM 40 MG PO TBEC: 40.0000 mg | DELAYED_RELEASE_TABLET | Freq: Every day | ORAL | Status: DC

## 2022-10-30 MED ORDER — HYDROCODONE-ACETAMINOPHEN 5-325 MG PO TABS: 1.0000 | ORAL_TABLET | ORAL | 0 refills | Status: DC | PRN

## 2022-10-30 MED ORDER — CYCLOBENZAPRINE HCL 10 MG PO TABS: 10.0000 mg | ORAL_TABLET | Freq: Three times a day (TID) | ORAL | 0 refills | Status: DC | PRN

## 2022-10-30 MED FILL — Thrombin For Soln 5000 Unit: CUTANEOUS | Qty: 2 | Status: AC

## 2022-10-30 NOTE — Plan of Care (Signed)
  Problem: Education: Goal: Ability to verbalize activity precautions or restrictions will improve Outcome: Completed/Met Goal: Knowledge of the prescribed therapeutic regimen will improve Outcome: Completed/Met Goal: Understanding of discharge needs will improve Outcome: Completed/Met  Patient alert and oriented, ambulate, void. Surgical site is clean and dry, no sign of infection. D/c instructions explain and given all questions answered. Pt. Will d/c home per order.

## 2022-10-30 NOTE — Evaluation (Signed)
Occupational Therapy Evaluation Patient Details Name: Mark [name] MRN: [id number] DOB: 1951-02-09 Today's Date: 10/30/2022   History of Present Illness 71 yo M s/p ACDF.  PMH includes:  OSA, GERD, HTN, Hyperlipidemia, Arthritis.   Clinical Impression   Patient admitted for the diagnosis and procedure above.  PTA he remained active, and needed no assist with ADL completion or mobility.  Patient with a few ADL completion questions, education provided with good follow through and no further questions.  Precautions reviewed and patient verbalized understanding.  No further OT in the acute setting.  Recommend follow up as prescribed by MD.        Recommendations for follow up therapy are one component of a multi-disciplinary discharge planning process, led by the attending physician.  Recommendations may be updated based on patient status, additional functional criteria and insurance authorization.   Follow Up Recommendations  No OT follow up     Assistance Recommended at Discharge PRN  Patient can return home with the following Assist for transportation    Functional Status Assessment  Patient has not had a recent decline in their functional status  Equipment Recommendations  None recommended by OT    Recommendations for Other Services       Precautions / Restrictions Precautions Precautions: Cervical Precaution Booklet Issued: No Required Braces or Orthoses: Cervical Brace Cervical Brace: Soft collar;For comfort Restrictions Weight Bearing Restrictions: No      Mobility Bed Mobility                    Transfers Overall transfer level: Modified independent                        Balance Overall balance assessment: Mild deficits observed, not formally tested                                         ADL either performed or assessed with clinical judgement   ADL Overall ADL's : At baseline                                              Vision Patient Visual Report: No change from baseline       Perception     Praxis      Pertinent Vitals/Pain Pain Assessment Pain Assessment: Faces Faces Pain Scale: Hurts a little bit Pain Location: Back of his neck Pain Descriptors / Indicators: Aching Pain Intervention(s): Monitored during session     Hand Dominance Right   Extremity/Trunk Assessment Upper Extremity Assessment Upper Extremity Assessment: Overall WFL for tasks assessed   Lower Extremity Assessment Lower Extremity Assessment: Defer to PT evaluation   Cervical / Trunk Assessment Cervical / Trunk Assessment: Neck Surgery   Communication Communication Communication: No difficulties   Cognition Arousal/Alertness: Awake/alert Behavior During Therapy: WFL for tasks assessed/performed Overall Cognitive Status: Within Functional Limits for tasks assessed                                       General Comments   VSS on RA    Exercises     Shoulder Instructions      Home Living Family/patient  expects to be discharged to:: Private residence Living Arrangements: Spouse/significant other Available Help at Discharge: Family;Available 24 hours/day Type of Home: House             Bathroom Shower/Tub: Tub/shower unit;Walk-in shower   Bathroom Toilet: Standard Bathroom Accessibility: Yes How Accessible: Accessible via walker Home Equipment: None          Prior Functioning/Environment Prior Level of Function : Independent/Modified Independent;Driving                        OT Problem List: Pain      OT Treatment/Interventions:      OT Goals(Current goals can be found in the care plan section) Acute Rehab OT Goals Patient Stated Goal: Going home OT Goal Formulation: With patient Time For Goal Achievement: 11/03/22 Potential to Achieve Goals: Good  OT Frequency:      Co-evaluation              AM-PAC OT "6 Clicks" Daily  Activity     Outcome Measure Help from another person eating meals?: None Help from another person taking care of personal grooming?: None Help from another person toileting, which includes using toliet, bedpan, or urinal?: None Help from another person bathing (including washing, rinsing, drying)?: None Help from another person to put on and taking off regular upper body clothing?: None Help from another person to put on and taking off regular lower body clothing?: None 6 Click Score: 24   End of Session Equipment Utilized During Treatment: Cervical collar Nurse Communication: Mobility status  Activity Tolerance: Patient tolerated treatment well Patient left: in bed;with call bell/phone within reach;with family/visitor present  OT Visit Diagnosis: Pain Pain - Right/Left:  (c spine)                Time: 2703-5009 OT Time Calculation (min): 13 min Charges:  OT General Charges $OT Visit: 1 Visit OT Evaluation $OT Eval Moderate Complexity: 1 Mod  10/30/2022  RP, OTR/L  Acute Rehabilitation Services  Office:  [phone]   Mark [name] 10/30/2022, 10:00 AM

## 2022-10-30 NOTE — Progress Notes (Signed)
Pt refusing CPAP tonight. Pt agreeable to wear 2L nasal cannula while sleeping.

## 2022-10-30 NOTE — Discharge Summary (Signed)
Physician Discharge Summary  Patient ID: Mark [name] MRN: [id number] DOB/AGE: 72-08-1951 72 y.o.  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      Discharge Diagnoses: same   Discharged Condition: good  Hospital Course: The patient was admitted on 10/29/2022 and taken to the operating room where the patient underwent acdf C5-6. The patient tolerated the procedure well and was taken to the recovery room and then to the floor in stable condition. The hospital course was routine. There were no complications. The wound remained clean dry and intact. Pt had appropriate neck soreness. No complaints of arm pain or new N/T/W. The patient remained afebrile with stable vital signs, and tolerated a regular diet. The patient continued to increase activities, and pain was well controlled with oral pain medications.   Consults: None  Significant Diagnostic Studies:  Results for orders placed or performed during the hospital encounter of 10/22/22  Surgical pcr screen   Specimen: Nasal Mucosa; Nasal Swab  Result Value Ref Range   MRSA, PCR NEGATIVE NEGATIVE   Staphylococcus aureus NEGATIVE NEGATIVE  Basic metabolic panel per protocol  Result Value Ref Range   Sodium 137 135 - 145 mmol/L   Potassium 4.3 3.5 - 5.1 mmol/L   Chloride 104 98 - 111 mmol/L   CO2 26 22 - 32 mmol/L   Glucose, Bld 124 (H) 70 - 99 mg/dL   BUN 15 8 - 23 mg/dL   Creatinine, Ser 3.23 0.61 - 1.24 mg/dL   Calcium 9.3 8.9 - 55.7 mg/dL   GFR, Estimated >32 >20 mL/min   Anion gap 7 5 - 15  CBC per protocol  Result Value Ref Range   WBC 5.2 4.0 - 10.5 K/uL   RBC 4.36 4.22 - 5.81 MIL/uL   Hemoglobin 13.7 13.0 - 17.0 g/dL   HCT 25.4 27.0 - 62.3 %   MCV 93.1 80.0 - 100.0 fL   MCH 31.4 26.0 - 34.0 pg   MCHC 33.7 30.0 - 36.0 g/dL   RDW 76.2 83.1 - 51.7 %   Platelets 177 150 - 400 K/uL   nRBC 0.0 0.0 - 0.2 %    DG  Cervical Spine 1 View  Result Date: 10/29/2022 CLINICAL DATA:  Fluoroscopic assistance for cervical fusion EXAM: DG CERVICAL SPINE - 1 VIEW COMPARISON:  Radiographs done on 04/08/2022 FINDINGS: Fluoroscopic image shows anterior surgical fusion at C5-C6 level. Intervertebral disc spacer is noted. Fluoroscopic time 5 seconds. Radiation dose 0.48 mGy. IMPRESSION: Fluoroscopic assistance was provided for surgical fusion at C5-C6 level. Electronically Signed   By: Ernie Avena M.D.   On: 10/29/2022 13:47   DG C-Arm 1-60 Min-No Report  Result Date: 10/29/2022 Fluoroscopy was utilized by the requesting physician.  No radiographic interpretation.   DG C-Arm 1-60 Min-No Report  Result Date: 10/29/2022 Fluoroscopy was utilized by the requesting physician.  No radiographic interpretation.   DG Chest 2 View  Result Date: 10/06/2022 CLINICAL DATA:  And cough for 1 week. EXAM: CHEST - 2 VIEW COMPARISON:  September 21, 2022. FINDINGS: Post median sternotomy as before. Cardiomediastinal contours and hilar structures are stable. Lungs are clear.  No pneumothorax.  No pleural effusion. On limited assessment there is no acute skeletal process. IMPRESSION: Post median sternotomy without acute cardiopulmonary disease. Electronically Signed   By: Donzetta Kohut M.D.   On: 10/06/2022 15:08    Antibiotics:  Anti-infectives (From admission, onward)    Start  Dose/Rate Route Frequency Ordered Stop   10/29/22 2000  ceFAZolin (ANCEF) IVPB 2g/100 mL premix        2 g 200 mL/hr over 30 Minutes Intravenous Every 8 hours 10/29/22 1549 10/30/22 0420   10/29/22 1030  ceFAZolin (ANCEF) IVPB 2g/100 mL premix        2 g 200 mL/hr over 30 Minutes Intravenous On call to O.R. 10/29/22 1019 10/29/22 1244       Discharge Exam: Blood pressure (!) 148/86, pulse 76, temperature 98.6 F (37 C), temperature source Oral, resp. rate 18, height 5\' 11"  (1.803 m), weight 93 kg, SpO2 96 %. Neurologic: Grossly  normal Ambulating and voiding well incision cdi   Discharge Medications:   Allergies as of 10/30/2022       Reactions   Mobic [meloxicam] Rash        Medication List     TAKE these medications    albuterol 108 (90 Base) MCG/ACT inhaler Commonly known as: VENTOLIN HFA TAKE 2 PUFFS BY MOUTH EVERY 6 HOURS AS NEEDED FOR WHEEZE OR SHORTNESS OF BREATH   ALPRAZolam 0.25 MG tablet Commonly known as: XANAX TAKE 1 TABLET (0.25 MG TOTAL) BY MOUTH AT BEDTIME AS NEEDED   APPLE CIDER VINEGAR PO Take 1 each by mouth daily. Gummies   aspirin 81 MG tablet Take 1 tablet (81 mg total) by mouth every morning.   atorvastatin 80 MG tablet Commonly known as: LIPITOR Take 1 tablet (80 mg total) by mouth daily.   augmented betamethasone dipropionate 0.05 % cream Commonly known as: DIPROLENE-AF Apply 1 Application topically 2 (two) times daily as needed (irritation).   cetirizine 10 MG tablet Commonly known as: ZYRTEC Take 5 mg by mouth daily as needed for allergies.   cyclobenzaprine 10 MG tablet Commonly known as: FLEXERIL Take 1 tablet (10 mg total) by mouth 3 (three) times daily as needed for muscle spasms.   HYDROcodone-acetaminophen 5-325 MG tablet Commonly known as: NORCO/VICODIN Take 1 tablet by mouth every 4 (four) hours as needed for severe pain ((score 7 to 10)).   isosorbide mononitrate 30 MG 24 hr tablet Commonly known as: IMDUR TAKE 1 TABLET BY MOUTH EVERY DAY   levothyroxine 100 MCG tablet Commonly known as: SYNTHROID Take 1 tablet (100 mcg total) by mouth daily.   losartan 50 MG tablet Commonly known as: COZAAR TAKE 1 TABLET BY MOUTH EVERY DAY   metoprolol succinate 25 MG 24 hr tablet Commonly known as: TOPROL-XL Take 0.5 tablets (12.5 mg total) by mouth daily.   naproxen 500 MG tablet Commonly known as: NAPROSYN Take 1 tablet (500 mg total) by mouth daily. What changed: when to take this   nitroGLYCERIN 0.4 MG SL tablet Commonly known as:  NITROSTAT Place 0.4 mg under the tongue every 5 (five) minutes as needed for chest pain.   NON FORMULARY Pt uses a cpap nightly   omeprazole 20 MG capsule Commonly known as: PRILOSEC TAKE 1 CAPSULE BY MOUTH EVERY DAY   psyllium 95 % Pack Commonly known as: HYDROCIL/METAMUCIL Take 1 packet by mouth daily.   SAW PALMETTO PO Take 1 capsule by mouth daily.   tamsulosin 0.4 MG Caps capsule Commonly known as: FLOMAX Take 1 capsule (0.4 mg total) by mouth daily.   triamcinolone cream 0.1 % Commonly known as: KENALOG Apply 1 application topically 2 (two) times daily. What changed:  when to take this reasons to take this        Disposition: home   Final Dx: acdf  C5-6  Discharge Instructions      Remove dressing in 72 hours   Complete by: As directed    Call MD for:  difficulty breathing, headache or visual disturbances   Complete by: As directed    Call MD for:  hives   Complete by: As directed    Call MD for:  persistant dizziness or light-headedness   Complete by: As directed    Call MD for:  persistant nausea and vomiting   Complete by: As directed    Call MD for:  redness, tenderness, or signs of infection (pain, swelling, redness, odor or green/yellow discharge around incision site)   Complete by: As directed    Call MD for:  severe uncontrolled pain   Complete by: As directed    Call MD for:  temperature >100.4   Complete by: As directed    Diet - low sodium heart healthy   Complete by: As directed    Driving Restrictions   Complete by: As directed    No driving for 2 weeks, no riding in the car for 1 week   Increase activity slowly   Complete by: As directed    Lifting restrictions   Complete by: As directed    No lifting more than 8 lbs          Signed: Tiana Loft Akash Winski 10/30/2022, 7:59 AM

## 2022-10-30 NOTE — Evaluation (Signed)
Physical Therapy Evaluation and Discharge  Patient Details Name: Mark [name] MRN: [id number] DOB: 05/07/1951 Today's Date: 10/30/2022  History of Present Illness  Pt is a 71 y/o male who presents s/p C5-C6 ACDF on 10/29/2022. PMH significant for BPH, CAD, esophageal stricture, hypothyroidism.   Clinical Impression  Patient evaluated by Physical Therapy with no further acute PT needs identified. All education has been completed and the patient has no further questions. Pt was able to demonstrate transfers and ambulation with gross modified independence and no AD. Pt was educated on precautions, brace application/wearing schedule, appropriate activity progression, and car transfer. See below for any follow-up Physical Therapy or equipment needs. PT is signing off. Thank you for this referral.        Recommendations for follow up therapy are one component of a multi-disciplinary discharge planning process, led by the attending physician.  Recommendations may be updated based on patient status, additional functional criteria and insurance authorization.  Follow Up Recommendations No PT follow up      Assistance Recommended at Discharge PRN  Patient can return home with the following  Assistance with cooking/housework;Assist for transportation;Help with stairs or ramp for entrance    Equipment Recommendations None recommended by PT  Recommendations for Other Services       Functional Status Assessment Patient has had a recent decline in their functional status and demonstrates the ability to make significant improvements in function in a reasonable and predictable amount of time.     Precautions / Restrictions Precautions Precautions: Fall;Cervical Precaution Booklet Issued: Yes (comment) Precaution Comments: Reviewed handout and pt was cued for precautions during functional mobility. Required Braces or Orthoses: Cervical Brace Cervical Brace: Soft collar;For  comfort Restrictions Weight Bearing Restrictions: No      Mobility  Bed Mobility Overal bed mobility: Modified Independent             General bed mobility comments: Pt demonstrated good log roll technique and was able to transition to EOB without assistance. HOB flat and rails lowered to simulate home environment.    Transfers Overall transfer level: Modified independent Equipment used: None               General transfer comment: Pt able to power up to full stand without assistance and with minimal UE support. No unsteadiness or LOB noted. VC's throughout for optimal maintenance of precautions.    Ambulation/Gait Ambulation/Gait assistance: Modified independent (Device/Increase time) Gait Distance (Feet): 300 Feet Assistive device: None Gait Pattern/deviations: Step-through pattern, Decreased stride length, Trunk flexed, Narrow base of support Gait velocity: Decreased Gait velocity interpretation: <1.31 ft/sec, indicative of household ambulator   General Gait Details: VC's for improved posture and maintenance of precautions. No unsteadiness or LOB noted however pt looking around the hallway and demonstrating cervical ROM without thinking about it.  Stairs Stairs: Yes Stairs assistance: Supervision Stair Management: One rail Left, Step to pattern, Forwards Number of Stairs: 10 General stair comments: VC's throughout for improved posture and maintenance of precautions.  Wheelchair Mobility    Modified Rankin (Stroke Patients Only)       Balance Overall balance assessment: Mild deficits observed, not formally tested                                           Pertinent Vitals/Pain Pain Assessment Pain Assessment: Faces Faces Pain Scale: Hurts a little bit Pain Location:  Back of his neck Pain Descriptors / Indicators: Aching Pain Intervention(s): Limited activity within patient's tolerance, Monitored during session, Repositioned     Home Living Family/patient expects to be discharged to:: Private residence Living Arrangements: Spouse/significant other Available Help at Discharge: Family;Available 24 hours/day Type of Home: House Home Access: Stairs to enter Entrance Stairs-Rails: Doctor, general practice of Steps: 13   Home Layout: One level Home Equipment: Shower seat - built in      Prior Function Prior Level of Function : Independent/Modified Independent;Driving                     Hand Dominance   Dominant Hand: Right    Extremity/Trunk Assessment   Upper Extremity Assessment Upper Extremity Assessment: Defer to OT evaluation    Lower Extremity Assessment Lower Extremity Assessment: Generalized weakness    Cervical / Trunk Assessment Cervical / Trunk Assessment: Neck Surgery  Communication   Communication: No difficulties  Cognition Arousal/Alertness: Awake/alert Behavior During Therapy: WFL for tasks assessed/performed Overall Cognitive Status: Within Functional Limits for tasks assessed                                          General Comments      Exercises     Assessment/Plan    PT Assessment Patient does not need any further PT services  PT Problem List Decreased strength;Decreased range of motion;Decreased activity tolerance;Decreased balance;Decreased mobility;Decreased knowledge of use of DME;Decreased safety awareness;Decreased knowledge of precautions;Pain       PT Treatment Interventions      PT Goals (Current goals can be found in the Care Plan section)  Acute Rehab PT Goals Patient Stated Goal: Home this morning PT Goal Formulation: All assessment and education complete, DC therapy    Frequency       Co-evaluation               AM-PAC PT "6 Clicks" Mobility  Outcome Measure Help needed turning from your back to your side while in a flat bed without using bedrails?: None Help needed moving from lying on your back to  sitting on the side of a flat bed without using bedrails?: None Help needed moving to and from a bed to a chair (including a wheelchair)?: None Help needed standing up from a chair using your arms (e.g., wheelchair or bedside chair)?: None Help needed to walk in hospital room?: None Help needed climbing 3-5 steps with a railing? : None 6 Click Score: 24    End of Session Equipment Utilized During Treatment: Gait belt Activity Tolerance: Patient tolerated treatment well Patient left: with call bell/phone within reach;in bed;with family/visitor present Nurse Communication: Mobility status PT Visit Diagnosis: Unsteadiness on feet (R26.81);Pain Pain - part of body:  (back)    Time: 0920-0930 PT Time Calculation (min) (ACUTE ONLY): 10 min   Charges:   PT Evaluation $PT Eval Low Complexity: 1 Low          Conni Slipper, PT, DPT Acute Rehabilitation Services Secure Chat Preferred Office: [phone]   Marylynn Pearson 10/30/2022, 10:53 AM

## 2022-11-13 ENCOUNTER — Telehealth: Payer: Self-pay | Admitting: Family Medicine

## 2022-11-13 DIAGNOSIS — E785 Hyperlipidemia, unspecified: Secondary | ICD-10-CM

## 2022-11-13 DIAGNOSIS — I10 Essential (primary) hypertension: Secondary | ICD-10-CM

## 2022-11-13 DIAGNOSIS — E039 Hypothyroidism, unspecified: Secondary | ICD-10-CM

## 2022-11-13 DIAGNOSIS — R7301 Impaired fasting glucose: Secondary | ICD-10-CM

## 2022-11-13 NOTE — Telephone Encounter (Signed)
Patient  has follow up and requesting labs

## 2022-11-17 NOTE — Telephone Encounter (Signed)
Lipid, liver, metabolic 7, J1P, TSH Hyperlipidemia, hypertension, fasting hyperglycemia, hypothyroidism

## 2022-11-18 NOTE — Telephone Encounter (Signed)
Lab orders and pt is aware

## 2022-11-19 DIAGNOSIS — K046 Periapical abscess with sinus: Secondary | ICD-10-CM | POA: Diagnosis not present

## 2022-11-19 DIAGNOSIS — M272 Inflammatory conditions of jaws: Secondary | ICD-10-CM | POA: Diagnosis not present

## 2022-12-03 DIAGNOSIS — I10 Essential (primary) hypertension: Secondary | ICD-10-CM | POA: Diagnosis not present

## 2022-12-03 DIAGNOSIS — R7301 Impaired fasting glucose: Secondary | ICD-10-CM | POA: Diagnosis not present

## 2022-12-03 DIAGNOSIS — E039 Hypothyroidism, unspecified: Secondary | ICD-10-CM | POA: Diagnosis not present

## 2022-12-03 DIAGNOSIS — E785 Hyperlipidemia, unspecified: Secondary | ICD-10-CM | POA: Diagnosis not present

## 2022-12-04 LAB — HEMOGLOBIN A1C
Est. average glucose Bld gHb Est-mCnc: 120 mg/dL
Hgb A1c MFr Bld: 5.8 % — ABNORMAL HIGH (ref 4.8–5.6)

## 2022-12-04 LAB — LIPID PANEL
Chol/HDL Ratio: 4 ratio (ref 0.0–5.0)
Cholesterol, Total: 164 mg/dL (ref 100–199)
HDL: 41 mg/dL (ref >39)
LDL Chol Calc (NIH): 66 mg/dL (ref 0–99)
Triglycerides: 364 mg/dL — ABNORMAL HIGH (ref 0–149)
VLDL Cholesterol Cal: 57 mg/dL — ABNORMAL HIGH (ref 5–40)

## 2022-12-04 LAB — BASIC METABOLIC PANEL
BUN/Creatinine Ratio: 16 (ref 10–24)
BUN: 15 mg/dL (ref 8–27)
CO2: 23 mmol/L (ref 20–29)
Calcium: 9.9 mg/dL (ref 8.6–10.2)
Chloride: 102 mmol/L (ref 96–106)
Creatinine, Ser: 0.95 mg/dL (ref 0.76–1.27)
Glucose: 123 mg/dL — ABNORMAL HIGH (ref 70–99)
Potassium: 4.7 mmol/L (ref 3.5–5.2)
Sodium: 140 mmol/L (ref 134–144)
eGFR: 85 mL/min/{1.73_m2} (ref >59)

## 2022-12-04 LAB — HEPATIC FUNCTION PANEL
ALT: 20 IU/L (ref 0–44)
AST: 25 IU/L (ref 0–40)
Albumin: 4.5 g/dL (ref 3.8–4.8)
Alkaline Phosphatase: 71 IU/L (ref 44–121)
Bilirubin Total: 1.8 mg/dL — ABNORMAL HIGH (ref 0.0–1.2)
Bilirubin, Direct: 0.35 mg/dL (ref 0.00–0.40)
Total Protein: 7.1 g/dL (ref 6.0–8.5)

## 2022-12-04 LAB — TSH: TSH: 1.76 u[IU]/mL (ref 0.450–4.500)

## 2022-12-06 ENCOUNTER — Other Ambulatory Visit: Payer: Self-pay | Admitting: Family Medicine

## 2022-12-12 ENCOUNTER — Ambulatory Visit (INDEPENDENT_AMBULATORY_CARE_PROVIDER_SITE_OTHER): Payer: Medicare PPO | Admitting: Family Medicine

## 2022-12-12 VITALS — BP 104/66 | HR 66 | Temp 98.9°F | Ht 71.0 in | Wt 204.4 lb

## 2022-12-12 DIAGNOSIS — E785 Hyperlipidemia, unspecified: Secondary | ICD-10-CM | POA: Diagnosis not present

## 2022-12-12 DIAGNOSIS — E039 Hypothyroidism, unspecified: Secondary | ICD-10-CM

## 2022-12-12 DIAGNOSIS — I10 Essential (primary) hypertension: Secondary | ICD-10-CM | POA: Diagnosis not present

## 2022-12-12 DIAGNOSIS — R7303 Prediabetes: Secondary | ICD-10-CM

## 2022-12-12 DIAGNOSIS — Z0001 Encounter for general adult medical examination with abnormal findings: Secondary | ICD-10-CM | POA: Diagnosis not present

## 2022-12-12 DIAGNOSIS — Z0000 Encounter for general adult medical examination without abnormal findings: Secondary | ICD-10-CM

## 2022-12-12 MED ORDER — OMEPRAZOLE 20 MG PO CPDR: 20.0000 mg | DELAYED_RELEASE_CAPSULE | Freq: Every day | ORAL | 1 refills | Status: DC

## 2022-12-12 MED ORDER — LEVOTHYROXINE SODIUM 100 MCG PO TABS: 100.0000 ug | ORAL_TABLET | Freq: Every day | ORAL | 1 refills | Status: DC

## 2022-12-12 MED ORDER — TAMSULOSIN HCL 0.4 MG PO CAPS: 0.4000 mg | ORAL_CAPSULE | Freq: Every day | ORAL | 1 refills | Status: DC

## 2022-12-12 MED ORDER — METOPROLOL SUCCINATE ER 25 MG PO TB24: 12.5000 mg | ORAL_TABLET | Freq: Every day | ORAL | 1 refills | Status: DC

## 2022-12-12 MED ORDER — LOSARTAN POTASSIUM 50 MG PO TABS: 50.0000 mg | ORAL_TABLET | Freq: Every day | ORAL | 1 refills | Status: DC

## 2022-12-12 MED ORDER — ATORVASTATIN CALCIUM 80 MG PO TABS: 80.0000 mg | ORAL_TABLET | Freq: Every day | ORAL | 1 refills | Status: DC

## 2022-12-12 NOTE — Progress Notes (Unsigned)
   Subjective:    Patient ID: Mark [name], male    DOB: 08/13/1951, 72 y.o.   MRN: [id number]  HPI AWV- Annual Wellness Visit  The patient was seen for their annual wellness visit. The patient's past medical history, surgical history, and family history were reviewed. Pertinent vaccines were reviewed ( tetanus, pneumonia, shingles, flu) The patient's medication list was reviewed and updated.  The height and weight were entered.  BMI recorded in electronic record elsewhere  Cognitive screening was completed. Outcome of Mini - Cog: 5   Falls /depression screening electronically recorded within record elsewhere  Current tobacco usage:no (All patients who use tobacco were given written and verbal information on quitting)  Recent listing of emergency department/hospitalizations over the past year were reviewed.  current specialist the patient sees on a regular basis: Neurologist    Medicare annual wellness visit patient questionnaire was reviewed.  A written screening schedule for the patient for the next 5-10 years was given. Appropriate discussion of followup regarding next visit was discussed.   The patient comes in today for a wellness visit.    A review of their health history was completed.  A review of medications was also completed.  Any needed refills; does need refills  Eating habits: Admits he could be eating healthier and has had more starches recently  Falls/  MVA accidents in past few months: Denies depression denies falls  Regular exercise: Stays physically active splits wood but had recent neck surgery so therefore his activity is curtailed  Specialist pt sees on regular basis: Sees some specialist on regular basis  Preventative health issues were discussed.   Additional concerns: Review over his current health issues     Review of Systems     Objective:   Physical Exam  General-in no acute distress Eyes-no discharge Lungs-respiratory  rate normal, CTA CV-no murmurs,RRR Extremities skin warm dry no edema Neuro grossly normal Behavior normal, alert Prostate exam not indicated      Assessment & Plan:  1. Well adult exam Adult wellness-complete.wellness physical was conducted today. Importance of diet and exercise were discussed in detail.  Importance of stress reduction and healthy living were discussed.  In addition to this a discussion regarding safety was also covered.  We also reviewed over immunizations and gave recommendations regarding current immunization needed for age.   In addition to this additional areas were also touched on including: Preventative health exams needed:  Colonoscopy up-to-date  Patient was advised yearly wellness exam   2. Encounter for subsequent annual wellness visit (AWV) in Medicare patient Passes mini cog Encourage him on flu shot he defers today   3. Essential hypertension, benign Good control continue current measures  4. Hypothyroidism, unspecified type Thyroid good control continue current measures  5. Hyperlipidemia, unspecified hyperlipidemia type Triglycerides elevated healthier diet recommended repeat labs in 6 months continue current measures  6. Prediabetes A1c has gone up minimize starches stay physically active keep alcohol to 1 drink or less per day follow-up again in 6 months

## 2022-12-16 DIAGNOSIS — M5412 Radiculopathy, cervical region: Secondary | ICD-10-CM | POA: Diagnosis not present

## 2022-12-30 NOTE — Progress Notes (Unsigned)
We will check  Cardiology Office Note:    Date:  12/31/2022   ID:  Mark [name], DOB [birth year], MRN [id number]  PCP:  [name], MD   [facility] Providers Cardiologist:  [name], MD Referring MD: [name], MD   Chief Complaint/Reason for Referral: Cardiology follow-up  ASSESSMENT:    Angina pectoris ([place])  Hyperlipidemia LDL goal <70 - Plan: Lipoprotein A (LPA)  Primary hypertension    PLAN:    In order of problems listed above:  1.  Angina pectoris: Controlled with medications.   2.  Hyperlipidemia: Recent lipid panel was at goal.  Check LP(a). 3.  Hypertension: Blood pressure is well-controlled on his current regimen.   Dispo:  Return in about 1 year (around 01/01/2024).     Medication Adjustments/Labs and Tests Ordered: Current medicines are reviewed at length with the patient today.  Concerns regarding medicines are outlined above.   Tests Ordered: Orders Placed This Encounter  Procedures   Lipoprotein A (LPA)    Medication Changes: No orders of the defined types were placed in this encounter.   History of Present Illness:    FOCUSED CARDIOVASCULAR PROBLEM LIST:   1.  Coronary artery disease status post CABG consisting of a LIMA to LAD; most recent cardiac catheterization 2019 demonstrated widely patent LIMA, and FFR negative left main and obtuse marginal disease 2.  Hypertension 3.  Hyperlipidemia 4.  Obstructive sleep apnea on CPAP  February 2023:  The patient is a 72 y.o. male with the indicated medical history here for follow-up.  When I saw him last there was a question about whether he was having chest pain or GERD.  He was started on medical therapy including Imdur, nitroglycerin as well as continuing his beta-blocker.  The patient tells me that his chest pain symptoms are much improved.  He is able to walk a mile or 2 every other day without chest pain symptoms.  He has noticed that his sleep has been a little bit  more interrupted.  I suggested that he potentially change that when he takes his Imdur.  He did have an episode of chest pain today that took a few minutes to resolve.  He did not take as needed nitroglycerin.  He denies any shortness of breath, presyncope, syncope, palpitations, paroxysmal nocturnal dyspnea, orthopnea.  He has required no emergency room visits or hospitalizations.  Plan:  Continue medical therapy for stable angina.  Today: The patient returns for routine follow-up.  In the interim he underwent cervical spine surgery without any cardiovascular issues.  The patient is doing very well.  He denies any exertional angina, exertional dyspnea, presyncope, or syncope.  He still recovering from cervical spine surgery and is off naproxen because of this.  This is resulted in a lot of different joint aches that naproxen usually controls.  He is hopeful that he will be able to restart this medication in March when he sees the neurosurgeon.  He is otherwise well without cardiovascular complaints.         Current Medications: Current Meds  Medication Sig   albuterol (VENTOLIN HFA) 108 (90 Base) MCG/ACT inhaler TAKE 2 PUFFS BY MOUTH EVERY 6 HOURS AS NEEDED FOR WHEEZE OR SHORTNESS OF BREATH   ALPRAZolam (XANAX) 0.25 MG tablet TAKE 1 TABLET (0.25 MG TOTAL) BY MOUTH AT BEDTIME AS NEEDED   APPLE CIDER VINEGAR PO Take 1 each by mouth daily. Gummies   aspirin 81 MG tablet Take 1 tablet (81  mg total) by mouth every morning.   atorvastatin (LIPITOR) 80 MG tablet Take 1 tablet (80 mg total) by mouth daily.   augmented betamethasone dipropionate (DIPROLENE-AF) 0.05 % cream Apply 1 Application topically 2 (two) times daily as needed (irritation).   cetirizine (ZYRTEC) 10 MG tablet Take 5 mg by mouth daily as needed for allergies.   isosorbide mononitrate (IMDUR) 30 MG 24 hr tablet TAKE 1 TABLET BY MOUTH EVERY DAY   levothyroxine (SYNTHROID) 100 MCG tablet Take 1 tablet (100 mcg total) by mouth daily.    losartan (COZAAR) 50 MG tablet Take 1 tablet (50 mg total) by mouth daily.   metoprolol succinate (TOPROL-XL) 25 MG 24 hr tablet Take 0.5 tablets (12.5 mg total) by mouth daily.   naproxen (NAPROSYN) 500 MG tablet Take 1 tablet (500 mg total) by mouth daily.   nitroGLYCERIN (NITROSTAT) 0.4 MG SL tablet Place 0.4 mg under the tongue every 5 (five) minutes as needed for chest pain.   NON FORMULARY Pt uses a cpap nightly   omeprazole (PRILOSEC) 20 MG capsule Take 1 capsule (20 mg total) by mouth daily.   psyllium (HYDROCIL/METAMUCIL) 95 % PACK Take 1 packet by mouth daily.   Saw Palmetto, Serenoa repens, (SAW PALMETTO PO) Take 1 capsule by mouth daily.   tamsulosin (FLOMAX) 0.4 MG CAPS capsule Take 1 capsule (0.4 mg total) by mouth daily.   triamcinolone cream (KENALOG) 0.1 % Apply 1 application topically 2 (two) times daily. (Patient taking differently: Apply 1 application  topically 2 (two) times daily as needed (irritation).)     Allergies:    Mobic [meloxicam]   Social History:   Social History   Tobacco Use   Smoking status: Former    Packs/day: 2.00    Years: 20.00    Total pack years: 40.00    Types: Cigarettes    Quit date: 07/31/1979    Years since quitting: 43.4   Smokeless tobacco: Former    Types: Chew    Quit date: 07/22/1995  Vaping Use   Vaping Use: Never used  Substance Use Topics   Alcohol use: Yes    Comment: occassional   Drug use: No     Family Hx: Family History  Problem Relation Age of Onset   Heart disease Father    Hypertension Father      Review of Systems:   Please see the history of present illness.    All other systems reviewed and are negative.     EKGs/Labs/Other Test Reviewed:    Cor angio 2019 Severe 1 V CAD - 100% CTO of LAD that fills via LIMA Moderate (FFR Negative) Prox LM & Prox OM1.  TTE 2019 - Left ventricle: The cavity size was normal. Wall thickness was    increased in a pattern of mild LVH. Systolic function was normal.     The estimated ejection fraction was in the range of 60% to 65%.    Wall motion was normal; there were no regional wall motion    abnormalities. Left ventricular diastolic function parameters    were normal.  - Aortic valve: Mildly calcified annulus. Trileaflet; normal    thickness leaflets. Valve area (VTI): 3.64 cm^2. Valve area    (Vmax): 3.36 cm^2.   EKG: EKG demonstrates sinus rhythm with nonspecific ST and T wave changes.  Imaging studies that I have independently reviewed today: No imaging studies demonstrating aortic atherosclerosis  Recent Labs: 10/22/2022: Hemoglobin 13.7; Platelets 177 12/03/2022: ALT 20; BUN 15; Creatinine, Ser  0.95; Potassium 4.7; Sodium 140; TSH 1.760   Recent Lipid Panel Lab Results  Component Value Date/Time   CHOL 164 12/03/2022 09:49 AM   TRIG 364 (H) 12/03/2022 09:49 AM   HDL 41 12/03/2022 09:49 AM   LDLCALC 66 12/03/2022 09:49 AM    Risk Assessment/Calculations:          Physical Exam:    VS:  BP 122/60   Pulse 76   Ht 5' 11"$  (1.803 m)   Wt 203 lb 3.2 oz (92.2 kg)   SpO2 98%   BMI 28.34 kg/m    Wt Readings from Last 3 Encounters:  12/31/22 203 lb 3.2 oz (92.2 kg)  12/12/22 204 lb 6.4 oz (92.7 kg)  10/29/22 205 lb (93 kg)    GENERAL:  No apparent distress, AOx3 HEENT:  No carotid bruits, +2 carotid impulses, no scleral icterus CAR: RRR no murmurs, gallops, rubs, or thrills RES:  Clear to auscultation bilaterally ABD:  Soft, nontender, nondistended, positive bowel sounds x 4 VASC:  +2 radial pulses, +2 carotid pulses, palpable pedal pulses NEURO:  CN 2-12 grossly intact; motor and sensory grossly intact PSYCH:  No active depression or anxiety EXT:  No edema, ecchymosis, or cyanosis  Signed, Early Osmond, MD  12/31/2022 10:19 AM    [address], [place], [place]  [address] Phone: [phone]; Fax: [phone]   Note:  This document was prepared using Dragon voice recognition software  and may include unintentional dictation errors.

## 2022-12-31 ENCOUNTER — Ambulatory Visit: Payer: Medicare PPO | Attending: Internal Medicine | Admitting: Internal Medicine

## 2022-12-31 ENCOUNTER — Encounter: Payer: Self-pay | Admitting: Internal Medicine

## 2022-12-31 VITALS — BP 122/60 | HR 76 | Ht 71.0 in | Wt 203.2 lb

## 2022-12-31 DIAGNOSIS — I209 Angina pectoris, unspecified: Secondary | ICD-10-CM | POA: Diagnosis not present

## 2022-12-31 DIAGNOSIS — E785 Hyperlipidemia, unspecified: Secondary | ICD-10-CM

## 2022-12-31 DIAGNOSIS — I10 Essential (primary) hypertension: Secondary | ICD-10-CM | POA: Diagnosis not present

## 2022-12-31 NOTE — Patient Instructions (Signed)
Medication Instructions:  Your physician recommends that you continue on your current medications as directed. Please refer to the Current Medication list given to you today.  *If you need a refill on your cardiac medications before your next appointment, please call your pharmacy*   Lab Work: LPa If you have labs (blood work) drawn today and your tests are completely normal, you will receive your results only by: [place] (if you have MyChart) OR A paper copy in the mail If you have any lab test that is abnormal or we need to change your treatment, we will call you to review the results.   Follow-Up: At [facility], you and your health needs are our priority.  As part of our continuing mission to provide you with exceptional heart care, we have created designated Provider Care Teams.  These Care Teams include your primary Cardiologist (physician) and Advanced Practice Providers (APPs -  Physician Assistants and Nurse Practitioners) who all work together to provide you with the care you need, when you need it.   Your next appointment:   1 year(s)  Provider:   Early Osmond, MD

## 2023-01-01 LAB — LIPOPROTEIN A (LPA): Lipoprotein (a): 101.9 nmol/L — ABNORMAL HIGH (ref <75.0)

## 2023-01-05 ENCOUNTER — Telehealth: Payer: Self-pay | Admitting: *Deleted

## 2023-01-05 ENCOUNTER — Other Ambulatory Visit: Payer: Self-pay | Admitting: *Deleted

## 2023-01-05 DIAGNOSIS — E785 Hyperlipidemia, unspecified: Secondary | ICD-10-CM

## 2023-01-05 MED ORDER — ROSUVASTATIN CALCIUM 40 MG PO TABS: 40.0000 mg | ORAL_TABLET | Freq: Every day | ORAL | 3 refills | Status: DC

## 2023-01-05 NOTE — Telephone Encounter (Signed)
Patient notified.  He would like to finish his current supply of Atorvastatin. He has 3-4 weeks left.  He will finish this and then change to Rosuvastatin.  Prescription [facility] to [facility] on [address] in [place].  Patient will have fasting lab work done at [facility] in [place] on 03/30/23

## 2023-01-05 NOTE — Telephone Encounter (Signed)
-----   Message from Early Osmond, MD sent at 01/01/2023  8:09 AM EST ----- Lets stop atorvastatin and start Crestor 40 mg and check a lipid panel LFTs in 2 months.  Thanks

## 2023-01-06 ENCOUNTER — Other Ambulatory Visit: Payer: Self-pay | Admitting: Family Medicine

## 2023-01-06 ENCOUNTER — Encounter: Payer: Self-pay | Admitting: Family Medicine

## 2023-01-09 DIAGNOSIS — G4733 Obstructive sleep apnea (adult) (pediatric): Secondary | ICD-10-CM | POA: Diagnosis not present

## 2023-01-14 ENCOUNTER — Other Ambulatory Visit: Payer: Self-pay | Admitting: Family Medicine

## 2023-01-14 ENCOUNTER — Encounter: Payer: Self-pay | Admitting: Family Medicine

## 2023-01-14 MED ORDER — ALPRAZOLAM 0.25 MG PO TABS: 0.2500 mg | ORAL_TABLET | Freq: Every evening | ORAL | 2 refills | Status: DC | PRN

## 2023-01-27 DIAGNOSIS — M5412 Radiculopathy, cervical region: Secondary | ICD-10-CM | POA: Diagnosis not present

## 2023-01-27 DIAGNOSIS — M542 Cervicalgia: Secondary | ICD-10-CM | POA: Diagnosis not present

## 2023-01-27 DIAGNOSIS — Z6828 Body mass index (BMI) 28.0-28.9, adult: Secondary | ICD-10-CM | POA: Diagnosis not present

## 2023-03-18 DIAGNOSIS — G4733 Obstructive sleep apnea (adult) (pediatric): Secondary | ICD-10-CM | POA: Diagnosis not present

## 2023-04-01 DIAGNOSIS — E785 Hyperlipidemia, unspecified: Secondary | ICD-10-CM | POA: Diagnosis not present

## 2023-04-02 LAB — HEPATIC FUNCTION PANEL
ALT: 17 IU/L (ref 0–44)
AST: 19 IU/L (ref 0–40)
Albumin: 4.3 g/dL (ref 3.8–4.8)
Alkaline Phosphatase: 55 IU/L (ref 44–121)
Bilirubin Total: 1.5 mg/dL — ABNORMAL HIGH (ref 0.0–1.2)
Bilirubin, Direct: 0.31 mg/dL (ref 0.00–0.40)
Total Protein: 6.5 g/dL (ref 6.0–8.5)

## 2023-04-02 LAB — LIPID PANEL
Chol/HDL Ratio: 3.6 ratio (ref 0.0–5.0)
Cholesterol, Total: 137 mg/dL (ref 100–199)
HDL: 38 mg/dL — ABNORMAL LOW (ref >39)
LDL Chol Calc (NIH): 45 mg/dL (ref 0–99)
Triglycerides: 364 mg/dL — ABNORMAL HIGH (ref 0–149)
VLDL Cholesterol Cal: 54 mg/dL — ABNORMAL HIGH (ref 5–40)

## 2023-04-09 ENCOUNTER — Other Ambulatory Visit: Payer: Self-pay | Admitting: Internal Medicine

## 2023-04-14 DIAGNOSIS — H02825 Cysts of left lower eyelid: Secondary | ICD-10-CM | POA: Diagnosis not present

## 2023-04-14 DIAGNOSIS — H25013 Cortical age-related cataract, bilateral: Secondary | ICD-10-CM | POA: Diagnosis not present

## 2023-04-14 DIAGNOSIS — H4321 Crystalline deposits in vitreous body, right eye: Secondary | ICD-10-CM | POA: Diagnosis not present

## 2023-04-14 DIAGNOSIS — H2513 Age-related nuclear cataract, bilateral: Secondary | ICD-10-CM | POA: Diagnosis not present

## 2023-05-26 DIAGNOSIS — L814 Other melanin hyperpigmentation: Secondary | ICD-10-CM | POA: Diagnosis not present

## 2023-05-26 DIAGNOSIS — L570 Actinic keratosis: Secondary | ICD-10-CM | POA: Diagnosis not present

## 2023-06-07 ENCOUNTER — Encounter: Payer: Self-pay | Admitting: Family Medicine

## 2023-06-08 NOTE — Telephone Encounter (Signed)
Lipid, liver, metabolic 7, urine ACR, CBC, TSH, A1c, PSA Hypertension, hyperlipidemia, hypothyroidism, other fatigue, prostate cancer screening BPH

## 2023-06-09 ENCOUNTER — Other Ambulatory Visit: Payer: Self-pay

## 2023-06-09 DIAGNOSIS — E039 Hypothyroidism, unspecified: Secondary | ICD-10-CM

## 2023-06-09 DIAGNOSIS — R7303 Prediabetes: Secondary | ICD-10-CM

## 2023-06-09 DIAGNOSIS — E785 Hyperlipidemia, unspecified: Secondary | ICD-10-CM

## 2023-06-09 DIAGNOSIS — I10 Essential (primary) hypertension: Secondary | ICD-10-CM

## 2023-06-09 DIAGNOSIS — Z125 Encounter for screening for malignant neoplasm of prostate: Secondary | ICD-10-CM

## 2023-06-09 DIAGNOSIS — N401 Enlarged prostate with lower urinary tract symptoms: Secondary | ICD-10-CM

## 2023-06-10 DIAGNOSIS — E039 Hypothyroidism, unspecified: Secondary | ICD-10-CM | POA: Diagnosis not present

## 2023-06-10 DIAGNOSIS — N401 Enlarged prostate with lower urinary tract symptoms: Secondary | ICD-10-CM | POA: Diagnosis not present

## 2023-06-10 DIAGNOSIS — I10 Essential (primary) hypertension: Secondary | ICD-10-CM | POA: Diagnosis not present

## 2023-06-10 DIAGNOSIS — E785 Hyperlipidemia, unspecified: Secondary | ICD-10-CM | POA: Diagnosis not present

## 2023-06-10 DIAGNOSIS — R7303 Prediabetes: Secondary | ICD-10-CM | POA: Diagnosis not present

## 2023-06-11 ENCOUNTER — Other Ambulatory Visit: Payer: Self-pay | Admitting: Family Medicine

## 2023-06-11 LAB — CBC WITH DIFFERENTIAL/PLATELET
Basophils Absolute: 0 10*3/uL (ref 0.0–0.2)
EOS (ABSOLUTE): 0.3 10*3/uL (ref 0.0–0.4)
Eos: 5 %
Hematocrit: 43.8 % (ref 37.5–51.0)
Hemoglobin: 14.6 g/dL (ref 13.0–17.7)
Immature Grans (Abs): 0 10*3/uL (ref 0.0–0.1)
Lymphocytes Absolute: 1.7 10*3/uL (ref 0.7–3.1)
Lymphs: 37 %
MCH: 31.1 pg (ref 26.6–33.0)
MCV: 93 fL (ref 79–97)
Monocytes Absolute: 0.4 10*3/uL (ref 0.1–0.9)
Monocytes: 9 %
Neutrophils Absolute: 2.2 10*3/uL (ref 1.4–7.0)
Neutrophils: 48 %
RBC: 4.69 x10E6/uL (ref 4.14–5.80)
WBC: 4.6 10*3/uL (ref 3.4–10.8)

## 2023-06-11 LAB — HEPATIC FUNCTION PANEL
ALT: 12 IU/L (ref 0–44)
Alkaline Phosphatase: 51 IU/L (ref 44–121)
Bilirubin Total: 1.4 mg/dL — ABNORMAL HIGH (ref 0.0–1.2)
Bilirubin, Direct: 0.3 mg/dL (ref 0.00–0.40)
Total Protein: 6.8 g/dL (ref 6.0–8.5)

## 2023-06-11 LAB — LIPID PANEL
Chol/HDL Ratio: 3.1 ratio (ref 0.0–5.0)
Cholesterol, Total: 130 mg/dL (ref 100–199)
LDL Chol Calc (NIH): 56 mg/dL (ref 0–99)
Triglycerides: 193 mg/dL — ABNORMAL HIGH (ref 0–149)
VLDL Cholesterol Cal: 32 mg/dL (ref 5–40)

## 2023-06-11 LAB — HEMOGLOBIN A1C: Est. average glucose Bld gHb Est-mCnc: 114 mg/dL

## 2023-06-11 LAB — BASIC METABOLIC PANEL
BUN/Creatinine Ratio: 13 (ref 10–24)
CO2: 22 mmol/L (ref 20–29)
Calcium: 9.6 mg/dL (ref 8.6–10.2)
Chloride: 103 mmol/L (ref 96–106)
Creatinine, Ser: 1.04 mg/dL (ref 0.76–1.27)
Glucose: 118 mg/dL — ABNORMAL HIGH (ref 70–99)
Sodium: 141 mmol/L (ref 134–144)
eGFR: 76 mL/min/{1.73_m2} (ref >59)

## 2023-06-11 LAB — MICROALBUMIN / CREATININE URINE RATIO
Creatinine, Urine: 17.8 mg/dL
Microalb/Creat Ratio: <17 mg/g creat (ref 0–29)
Microalbumin, Urine: <3 ug/mL

## 2023-06-12 ENCOUNTER — Encounter: Payer: Self-pay | Admitting: Family Medicine

## 2023-06-12 ENCOUNTER — Ambulatory Visit: Payer: Medicare PPO | Admitting: Family Medicine

## 2023-06-12 VITALS — BP 112/74 | HR 67 | Wt 200.8 lb

## 2023-06-12 DIAGNOSIS — Z0001 Encounter for general adult medical examination with abnormal findings: Secondary | ICD-10-CM | POA: Diagnosis not present

## 2023-06-12 DIAGNOSIS — R972 Elevated prostate specific antigen [PSA]: Secondary | ICD-10-CM

## 2023-06-12 DIAGNOSIS — R351 Nocturia: Secondary | ICD-10-CM

## 2023-06-12 DIAGNOSIS — M545 Low back pain, unspecified: Secondary | ICD-10-CM | POA: Diagnosis not present

## 2023-06-12 DIAGNOSIS — Z0000 Encounter for general adult medical examination without abnormal findings: Secondary | ICD-10-CM

## 2023-06-12 DIAGNOSIS — N401 Enlarged prostate with lower urinary tract symptoms: Secondary | ICD-10-CM

## 2023-06-12 NOTE — Progress Notes (Unsigned)
   Subjective:    Patient ID: Mark [name], male    DOB: [birth year], 72 y.o.   MRN: [id number]  HPI Patient arrives 6 month follow up, Patient states no concerns or issues today. The patient comes in today for a wellness visit.    A review of their health history was completed.  A review of medications was also completed.  Any needed refills; update refills  Eating habits: Tries to eat relatively healthy  Falls/  MVA accidents in past few months: Has not had any accidents or falls recently  Regular exercise: Does try to fit in regular walking multiple days a week  Specialist pt sees on regular basis: None currently set for cardiology  Preventative health issues were discussed.   Additional concerns: Patient had recent labs PSA elevated  Has underlying heart disease stable Has BPH with urinary frequency and nocturia     Review of Systems     Objective:   Physical Exam General-in no acute distress Eyes-no discharge Lungs-respiratory rate normal, CTA CV-no murmurs,RRR Extremities skin warm dry no edema Neuro grossly normal Behavior normal, alert Prostate exam soft enlarged       Assessment & Plan:  1. Well adult exam Adult wellness-complete.wellness physical was conducted today. Importance of diet and exercise were discussed in detail.  Importance of stress reduction and healthy living were discussed.  In addition to this a discussion regarding safety was also covered.  We also reviewed over immunizations and gave recommendations regarding current immunization needed for age.   In addition to this additional areas were also touched on including: Preventative health exams needed:  Colonoscopy 2022 next 1 depending on the path report we have requested this from [facility]  Patient was advised yearly wellness exam   2. Elevated PSA Referral to urology.  May need ultrasound may need additional testing - PSA, total and free - DG Lumbar  Spine Complete  3. Benign prostatic hyperplasia with nocturia Hopefully this is a source of his elevated PSA - PSA, total and free  4. Lumbar pain He does have intermittent low back pain with no sciatica.  Plain x-rays ordered because of elevated PSA - DG Lumbar Spine Complete  Cardiology standpoint stable Blood pressure good Cholesterol profile looks good Refills of medicine given Follow-up 6 months

## 2023-06-12 NOTE — Patient Instructions (Signed)

## 2023-06-13 MED ORDER — METOPROLOL SUCCINATE ER 25 MG PO TB24: 12.5000 mg | ORAL_TABLET | Freq: Every day | ORAL | 1 refills | Status: DC

## 2023-06-13 MED ORDER — ALPRAZOLAM 0.25 MG PO TABS: 0.2500 mg | ORAL_TABLET | Freq: Every evening | ORAL | 2 refills | Status: DC | PRN

## 2023-06-13 MED ORDER — OMEPRAZOLE 20 MG PO CPDR: 20.0000 mg | DELAYED_RELEASE_CAPSULE | Freq: Every day | ORAL | 1 refills | Status: DC

## 2023-06-13 MED ORDER — TAMSULOSIN HCL 0.4 MG PO CAPS: 0.4000 mg | ORAL_CAPSULE | Freq: Every day | ORAL | 1 refills | Status: DC

## 2023-06-13 MED ORDER — LEVOTHYROXINE SODIUM 100 MCG PO TABS: 100.0000 ug | ORAL_TABLET | Freq: Every day | ORAL | 1 refills | Status: DC

## 2023-06-15 ENCOUNTER — Ambulatory Visit (HOSPITAL_COMMUNITY)
Admission: RE | Admit: 2023-06-15 | Discharge: 2023-06-15 | Disposition: A | Discharge status: Home / Self Care | Payer: Medicare PPO | Source: Ambulatory Visit | Attending: Family Medicine | Admitting: Family Medicine

## 2023-06-15 ENCOUNTER — Other Ambulatory Visit: Payer: Self-pay

## 2023-06-15 DIAGNOSIS — R972 Elevated prostate specific antigen [PSA]: Secondary | ICD-10-CM | POA: Insufficient documentation

## 2023-06-15 DIAGNOSIS — M545 Low back pain, unspecified: Secondary | ICD-10-CM | POA: Diagnosis not present

## 2023-06-15 DIAGNOSIS — I700 Atherosclerosis of aorta: Secondary | ICD-10-CM | POA: Diagnosis not present

## 2023-06-15 DIAGNOSIS — M47816 Spondylosis without myelopathy or radiculopathy, lumbar region: Secondary | ICD-10-CM | POA: Diagnosis not present

## 2023-06-29 DIAGNOSIS — L814 Other melanin hyperpigmentation: Secondary | ICD-10-CM | POA: Diagnosis not present

## 2023-06-29 DIAGNOSIS — Z7189 Other specified counseling: Secondary | ICD-10-CM | POA: Diagnosis not present

## 2023-06-29 DIAGNOSIS — L821 Other seborrheic keratosis: Secondary | ICD-10-CM | POA: Diagnosis not present

## 2023-06-29 DIAGNOSIS — D23122 Other benign neoplasm of skin of left lower eyelid, including canthus: Secondary | ICD-10-CM | POA: Diagnosis not present

## 2023-07-03 DIAGNOSIS — G4733 Obstructive sleep apnea (adult) (pediatric): Secondary | ICD-10-CM | POA: Diagnosis not present

## 2023-08-03 NOTE — Progress Notes (Signed)
H&P  Chief Complaint: Elevated PSA  History of Present Illness: 72 yo male sent by Dr Lilyan Punt for E/M of elevated PSA. PSa has had a slow/steady incline over the past few years, most recently 5.0 (11.2% free) on 7.24.2024.  Past Medical History:  Diagnosis Date   Abnormal nuclear stress test false positive 05/2018   BPH (benign prostatic hyperplasia) 04/21/2019   Chest pain stable CAD with cath 06/07/18 possible GI 06/03/2018   Coronary artery disease    a. s/p CABG 2005. b. cath 05/2018 - patent LIMA-LAD, moderate LM and prox OM disease with neg FFR.   Dyslipidemia    Esophageal stricture    ESOPHAGEAL STRICTURE 12/18/2008   Qualifier: History of  By: Candice Camp CMA (AAMA), Dottie     ESOPHAGITIS 12/18/2008   Qualifier: History of  By: Candice Camp CMA (AAMA), Dottie     Essential hypertension, benign 04/06/2013   Gastrointestinal bleeding    GERD (gastroesophageal reflux disease)    History of colonic polyps 08/26/2014   [facility] [place] during colonoscopy in May of 2015. They recommend repeat colonoscopy May of 2017 do to inadequate prep, pathology showed hyperplastic polyp    Hyperlipidemia    Hypothyroidism    Insomnia 04/21/2019   Obstructive sleep apnea 07/11/2013   Has mixed apnea, CPAP setting 7    OSA (obstructive sleep apnea)    RECTAL BLEEDING 12/18/2008   Qualifier: Diagnosis of  By: Candice Camp CMA (AAMA), Dottie     Urinary retention    Vitiligo 07/11/2013    Past Surgical History:  Procedure Laterality Date   ANTERIOR CERVICAL DECOMP/DISCECTOMY FUSION N/A 10/29/2022   Procedure: CERVICAL FIVE-SIX ANTERIOR CERVICAL DECOMPRESSION/DISCECTOMY FUSION;  Surgeon: Donalee Citrin, MD;  Location: [facility] OR;  Service: Neurosurgery;  Laterality: N/A;  3C   APPENDECTOMY     bilateral inguinal hernia repair     CARDIAC CATHETERIZATION  12/2005, 06/2005   CHOLECYSTECTOMY N/A 08/04/2022   Procedure: LAPAROSCOPIC CHOLECYSTECTOMY;  Surgeon:  Lewie Chamber, DO;  Location: [facility] ORS;  Service: General;  Laterality: N/A;   COLONOSCOPY     CORONARY ARTERY BYPASS GRAFT  2006   LIMA to LAD, off pump   CORONARY PRESSURE/FFR STUDY N/A 06/07/2018   Procedure: INTRAVASCULAR PRESSURE WIRE/FFR STUDY;  Surgeon: Marykay Lex, MD;  Location: [facility] INVASIVE CV LAB;  Service: Cardiovascular;  Laterality: N/A;   LEFT HEART CATH AND CORS/GRAFTS ANGIOGRAPHY N/A 06/07/2018   Procedure: LEFT HEART CATH AND CORS/GRAFTS ANGIOGRAPHY;  Surgeon: Marykay Lex, MD;  Location: [facility] INVASIVE CV LAB;  Service: Cardiovascular;  Laterality: N/A;   OPEN REDUCTION INTERNAL FIXATION (ORIF) HAND Left 07/21/2015   Procedure: IRRIGATION AND DEBRIDMENT LEFT INDEX AND MIDDLE FINGER, OPEN REDUCTION INTERNAL FIXATION LEFT INDEX FINGER.;  Surgeon: Dominica Severin, MD;  Location: [facility] OR;  Service: Orthopedics;  Laterality: Left;    Home Medications:  Allergies as of 08/04/2023       Reactions   Mobic [meloxicam] Rash        Medication List        Accurate as of August 03, 2023  9:17 AM. If you have any questions, ask your nurse or doctor.          albuterol 108 (90 Base) MCG/ACT inhaler Commonly known as: VENTOLIN HFA TAKE 2 PUFFS BY MOUTH EVERY 6 HOURS AS NEEDED FOR WHEEZE OR SHORTNESS OF BREATH   ALPRAZolam 0.25 MG tablet Commonly known as: XANAX Take 1 tablet (0.25 mg total) by mouth at  bedtime as needed.   APPLE CIDER VINEGAR PO Take 1 each by mouth daily. Gummies   aspirin 81 MG tablet Take 1 tablet (81 mg total) by mouth every morning.   augmented betamethasone dipropionate 0.05 % cream Commonly known as: DIPROLENE-AF Apply 1 Application topically 2 (two) times daily as needed (irritation).   cetirizine 10 MG tablet Commonly known as: ZYRTEC Take 5 mg by mouth daily as needed for allergies.   isosorbide mononitrate 30 MG 24 hr tablet Commonly known as: IMDUR TAKE 1 TABLET BY MOUTH EVERY DAY   levothyroxine 100 MCG tablet Commonly  known as: SYNTHROID Take 1 tablet (100 mcg total) by mouth daily.   losartan 50 MG tablet Commonly known as: COZAAR TAKE 1 TABLET BY MOUTH EVERY DAY   metoprolol succinate 25 MG 24 hr tablet Commonly known as: TOPROL-XL Take 0.5 tablets (12.5 mg total) by mouth daily.   naproxen 500 MG tablet Commonly known as: NAPROSYN Take 1 tablet (500 mg total) by mouth daily.   nitroGLYCERIN 0.4 MG SL tablet Commonly known as: NITROSTAT Place 0.4 mg under the tongue every 5 (five) minutes as needed for chest pain.   NON FORMULARY Pt uses a cpap nightly   omeprazole 20 MG capsule Commonly known as: PRILOSEC Take 1 capsule (20 mg total) by mouth daily.   rosuvastatin 40 MG tablet Commonly known as: CRESTOR Take 1 tablet (40 mg total) by mouth daily.   SAW PALMETTO PO Take 1 capsule by mouth daily.   tamsulosin 0.4 MG Caps capsule Commonly known as: FLOMAX Take 1 capsule (0.4 mg total) by mouth daily.   triamcinolone cream 0.1 % Commonly known as: KENALOG Apply 1 application topically 2 (two) times daily.        Allergies:  Allergies  Allergen Reactions   Mobic [Meloxicam] Rash    Family History  Problem Relation Age of Onset   Heart disease Father    Hypertension Father     Social History:  reports that he quit smoking about 44 years ago. His smoking use included cigarettes. He started smoking about 64 years ago. He has a 40 pack-year smoking history. He quit smokeless tobacco use about 28 years ago.  His smokeless tobacco use included chew. He reports current alcohol use. He reports that he does not use drugs.  ROS: A complete review of systems was performed.  All systems are negative except for pertinent findings as noted.  Physical Exam:  Vital signs in last 24 hours: There were no vitals taken for this visit. Constitutional:  Alert and oriented, No acute distress Cardiovascular: Regular rate  Respiratory: Normal respiratory effort GI: Abdomen is soft,  nontender, nondistended, no abdominal masses. No CVAT.  Genitourinary: Normal male phallus, testes are descended bilaterally and non-tender and without masses, scrotum is normal in appearance without lesions or masses, perineum is normal on inspection. Lymphatic: No lymphadenopathy Neurologic: Grossly intact, no focal deficits Psychiatric: Normal mood and affect   I have reviewed notes from referring/previous physicians  I have reviewed urinalysis results  I have reviewed prior PSA results  I have reviewed pIPSS form   Impression/Assessment:  ***  Plan:  ***

## 2023-08-04 ENCOUNTER — Ambulatory Visit: Payer: Medicare PPO | Admitting: Urology

## 2023-08-04 ENCOUNTER — Encounter: Payer: Self-pay | Admitting: Urology

## 2023-08-04 VITALS — BP 144/84 | HR 65

## 2023-08-04 DIAGNOSIS — R972 Elevated prostate specific antigen [PSA]: Secondary | ICD-10-CM

## 2023-08-04 LAB — URINALYSIS, ROUTINE W REFLEX MICROSCOPIC
Bilirubin, UA: NEGATIVE
Glucose, UA: NEGATIVE
Ketones, UA: NEGATIVE
Leukocytes,UA: NEGATIVE
Nitrite, UA: NEGATIVE
Protein,UA: NEGATIVE
RBC, UA: NEGATIVE
Specific Gravity, UA: <1.005 — ABNORMAL LOW (ref 1.005–1.030)
Urobilinogen, Ur: 0.2 mg/dL (ref 0.2–1.0)
pH, UA: 6.5 (ref 5.0–7.5)

## 2023-08-06 ENCOUNTER — Other Ambulatory Visit: Payer: Self-pay | Admitting: Urology

## 2023-08-06 ENCOUNTER — Telehealth: Payer: Self-pay

## 2023-08-06 DIAGNOSIS — R972 Elevated prostate specific antigen [PSA]: Secondary | ICD-10-CM

## 2023-08-06 MED ORDER — LEVOFLOXACIN 750 MG PO TABS
750.0000 mg | ORAL_TABLET | Freq: Every day | ORAL | 0 refills | Status: AC
Start: 2023-08-06 — End: 2023-08-07

## 2023-08-06 NOTE — Telephone Encounter (Signed)
Tried calling patient with no answer, left voice message for return call to offfice.

## 2023-08-06 NOTE — Telephone Encounter (Signed)
Patient is made aware and biopsy scheduled. Voiced understanding.

## 2023-08-06 NOTE — Telephone Encounter (Signed)
-----   Message from Bertram Millard Dahlstedt sent at 08/06/2023  9:03 AM EDT ----- Please call patient-PSA still elevated at 5.  I would recommend ultrasound and biopsy as discussed in the office.  I put those orders in. ----- Message ----- From: Interface, [facility] Lab Results In Sent: 08/04/2023  10:36 AM EDT To: Marcine Matar, MD

## 2023-08-06 NOTE — Telephone Encounter (Signed)
Patient returned your call to schedule biopsy

## 2023-08-07 DIAGNOSIS — H02825 Cysts of left lower eyelid: Secondary | ICD-10-CM | POA: Diagnosis not present

## 2023-08-10 DIAGNOSIS — D23122 Other benign neoplasm of skin of left lower eyelid, including canthus: Secondary | ICD-10-CM | POA: Diagnosis not present

## 2023-09-28 NOTE — Progress Notes (Signed)
This 72 year old man is here today for ultrasound and biopsy of the prostate.  PSA in both July and September of this year was 5.0.  Risks, benefits, and some of the potential complications of a transrectal ultrasounds of the prostate (TRUSP) with biopsies were discussed at length with the patient including gross hematuria, blood in the bowel movements, hematospermia, bacteremia, infection, voiding discomfort, urinary retention, fever, chills, sepsis, blood transfusion, death, and others. All questions were answered. Informed consent was obtained. The patient confirmed that he had taken his pre-procedure antibiotic. All anticoagulants were discontinued prior to the procedure. The patient emptied his bladder. He was positioned in a comfortable left lateral decubitus position with hips and knees acutely flexed.  The rectal probe was inserted into the rectum without difficulty. 10cc of 2% Lidocaine without epinephrine was instilled with a spinal needle using ultrasound guidance near the junction of each seminal vesicle and the prostate.  Sequential transverse (axial) scans were made in small increments beginning at the seminal vesicles and ending at the prostatic apex. Sequential longitudinal (saggital) scans were made in small increments beginning at the right lateral prostate and ending at the left lateral prostate. Excellent anatomical imaging was obtained. The peripheral, transitional, and central zones were well-defined. The seminal vesicles were normal.  Prostate volume 25 ml.  There were no hypoechoic areas. 12 needle core biopsies were performed. 1 biopsy each was taken from the following areas:  Right lateral base, right medial base, right lateral mid prostate, right medial mid prostate, right lateral apical prostate, right medial apical prostate, left lateral base, left medial base, left lateral mid prostate, left medial mid prostate, left lateral apical prostate, left medial apical prostate..  Minimal prostatic calcifications were noted. Excellent biopsy specimens were obtained.  Follow-up rectal examination was unremarkable. The procedure was well-tolerated and without complications. Antibiotic instructions were given. The patient was told that:  For several days:  he should increase his fluid intake and limit strenuous activity  he might have mild discomfort at the base of his penis or in his rectum  he might have blood in his urine or blood in his bowel movements  For 2-3 months:  he might have blood in his ejaculate (semen)  Instructions were given to call the office immedicately for blood clots in the urine or bowel movements, difficulty urinating, inability to urinate, urinary retention, painful or frequent urination, fever, chills, nausea, vomiting, or other illness. The patient stated that he understood these instructions and would comply with them. We told the patient that prostate biopsy pathology reports are usually available within 3-5 working days, unless a pathologic second opinion is required, which may take 7-14 days. We told him to contact us to check on the status of his biopsy if he has not heard from [place] within 7 days. The patient left the ultrasound examination room in stable condition.

## 2023-09-29 ENCOUNTER — Ambulatory Visit: Payer: Medicare PPO | Admitting: Urology

## 2023-09-29 ENCOUNTER — Encounter (HOSPITAL_COMMUNITY): Payer: Self-pay

## 2023-09-29 ENCOUNTER — Ambulatory Visit (HOSPITAL_COMMUNITY)
Admission: RE | Admit: 2023-09-29 | Discharge: 2023-09-29 | Disposition: A | Discharge status: Home / Self Care | Payer: Medicare PPO | Source: Ambulatory Visit | Attending: Urology | Admitting: Urology

## 2023-09-29 ENCOUNTER — Other Ambulatory Visit: Payer: Self-pay | Admitting: Urology

## 2023-09-29 DIAGNOSIS — C61 Malignant neoplasm of prostate: Secondary | ICD-10-CM | POA: Diagnosis not present

## 2023-09-29 DIAGNOSIS — R972 Elevated prostate specific antigen [PSA]: Secondary | ICD-10-CM | POA: Insufficient documentation

## 2023-09-29 MED ORDER — GENTAMICIN SULFATE 40 MG/ML IJ SOLN
160.0000 mg | Freq: Once | INTRAMUSCULAR | Status: AC
  Administered 2023-09-29: 160 mg via INTRAMUSCULAR

## 2023-09-29 MED ORDER — GENTAMICIN SULFATE 40 MG/ML IJ SOLN
INTRAMUSCULAR | Status: AC
  Filled 2023-09-29: qty 4

## 2023-09-29 MED ORDER — LIDOCAINE HCL (PF) 2 % IJ SOLN
INTRAMUSCULAR | Status: AC
  Filled 2023-09-29: qty 10

## 2023-09-29 MED ORDER — LIDOCAINE HCL (PF) 2 % IJ SOLN
INTRAMUSCULAR | Status: AC
  Filled 2023-09-29: qty 20

## 2023-09-29 MED ORDER — LIDOCAINE HCL (PF) 2 % IJ SOLN
10.0000 mL | Freq: Once | INTRAMUSCULAR | Status: AC
  Administered 2023-09-29: 10 mL

## 2023-09-30 LAB — SURGICAL PATHOLOGY

## 2023-10-01 ENCOUNTER — Encounter: Payer: Self-pay | Admitting: Urology

## 2023-10-01 ENCOUNTER — Telehealth: Payer: Self-pay

## 2023-10-01 ENCOUNTER — Encounter: Payer: Self-pay | Admitting: Family Medicine

## 2023-10-01 ENCOUNTER — Other Ambulatory Visit: Payer: Self-pay | Admitting: Urology

## 2023-10-01 DIAGNOSIS — C61 Malignant neoplasm of prostate: Secondary | ICD-10-CM

## 2023-10-01 NOTE — Telephone Encounter (Signed)
-----   Message from Bertram Millard Dahlstedt sent at 10/01/2023  1:21 PM EST ----- I have called pt w/ path results--intermediate risk PCa. Please see CT order. Also needs OV w/ me following CT

## 2023-10-01 NOTE — Telephone Encounter (Signed)
Please see patient request below.  I have let him know you will be in touch once you review results.  No follow up scheduled at this time.

## 2023-10-01 NOTE — Telephone Encounter (Signed)
Appt scheduled for MD's next available on 12/15/2023.

## 2023-10-02 ENCOUNTER — Other Ambulatory Visit: Payer: Self-pay | Admitting: Internal Medicine

## 2023-10-03 ENCOUNTER — Ambulatory Visit (HOSPITAL_COMMUNITY): Admission: RE | Admit: 2023-10-03 | Payer: Medicare PPO | Source: Ambulatory Visit

## 2023-10-03 DIAGNOSIS — G4733 Obstructive sleep apnea (adult) (pediatric): Secondary | ICD-10-CM | POA: Diagnosis not present

## 2023-10-05 ENCOUNTER — Ambulatory Visit (HOSPITAL_COMMUNITY)
Admission: RE | Admit: 2023-10-05 | Discharge: 2023-10-05 | Disposition: A | Discharge status: Home / Self Care | Payer: Medicare PPO | Source: Ambulatory Visit | Attending: Urology | Admitting: Urology

## 2023-10-05 DIAGNOSIS — I723 Aneurysm of iliac artery: Secondary | ICD-10-CM | POA: Diagnosis not present

## 2023-10-05 DIAGNOSIS — C61 Malignant neoplasm of prostate: Secondary | ICD-10-CM | POA: Diagnosis not present

## 2023-10-05 DIAGNOSIS — R932 Abnormal findings on diagnostic imaging of liver and biliary tract: Secondary | ICD-10-CM | POA: Diagnosis not present

## 2023-10-05 DIAGNOSIS — R935 Abnormal findings on diagnostic imaging of other abdominal regions, including retroperitoneum: Secondary | ICD-10-CM | POA: Diagnosis not present

## 2023-10-05 DIAGNOSIS — I728 Aneurysm of other specified arteries: Secondary | ICD-10-CM | POA: Diagnosis not present

## 2023-10-05 MED ORDER — IOHEXOL 300 MG/ML  SOLN
100.0000 mL | Freq: Once | INTRAMUSCULAR | Status: AC | PRN
Start: 2023-10-05 — End: 2023-10-05
  Administered 2023-10-05: 100 mL via INTRAVENOUS

## 2023-10-07 ENCOUNTER — Ambulatory Visit (INDEPENDENT_AMBULATORY_CARE_PROVIDER_SITE_OTHER): Payer: Medicare PPO | Admitting: Family Medicine

## 2023-10-07 VITALS — BP 134/88 | HR 69 | Temp 98.1°F | Ht 71.0 in | Wt 203.8 lb

## 2023-10-07 DIAGNOSIS — M25562 Pain in left knee: Secondary | ICD-10-CM | POA: Diagnosis not present

## 2023-10-07 DIAGNOSIS — C61 Malignant neoplasm of prostate: Secondary | ICD-10-CM

## 2023-10-08 NOTE — Progress Notes (Signed)
   Subjective:    Patient ID: Mark [name], male    DOB: August 01, 1951, 72 y.o.   MRN: [id number]  HPI Patient here today because of left knee pain and discomforts he has had previous trouble with this but is never locked or giving way he does state that he was doing some mild activity walking and some wood jumping over a creek and he felt pain in his knee has not been the same since hurts with certain movements does not lock or give way.  Patient also has recent prostate cancer diagnosis is awaiting a CT scan he will let us know when the results of that have come back Review of Systems     Objective:   Physical Exam General-in no acute distress Eyes-no discharge Lungs-respiratory rate normal, CTA CV-no murmurs,RRR Extremities skin warm dry no edema Neuro grossly normal Behavior normal, alert No laxity ligaments are noted.  Positive McMurray       Assessment & Plan:  Possibility of a strained cartilage versus a partial tear would benefit from evaluation by orthopedics as well as injection I do not feel that the patient necessarily needs surgery currently and I would not recommend NSAIDs because of his underlying health issues We will hold off on doing an x-ray we will have orthopedics do that for cost related issues

## 2023-10-09 ENCOUNTER — Other Ambulatory Visit: Payer: Self-pay

## 2023-10-09 DIAGNOSIS — M25562 Pain in left knee: Secondary | ICD-10-CM

## 2023-10-10 ENCOUNTER — Encounter: Payer: Self-pay | Admitting: Family Medicine

## 2023-10-11 ENCOUNTER — Encounter: Payer: Self-pay | Admitting: Internal Medicine

## 2023-10-11 DIAGNOSIS — I723 Aneurysm of iliac artery: Secondary | ICD-10-CM

## 2023-10-12 NOTE — Telephone Encounter (Signed)
3. 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 Suggest vascular surgery consultation.

## 2023-10-13 NOTE — Telephone Encounter (Signed)
Scheduled and patient aware

## 2023-10-13 NOTE — Telephone Encounter (Signed)
Referral placed to [facility] per Dr. Lynnette Caffey.  Pt aware and grateful for assistance.

## 2023-10-21 ENCOUNTER — Encounter: Payer: Self-pay | Admitting: Orthopedic Surgery

## 2023-10-21 ENCOUNTER — Ambulatory Visit: Payer: Medicare PPO | Admitting: Orthopedic Surgery

## 2023-10-21 ENCOUNTER — Other Ambulatory Visit (INDEPENDENT_AMBULATORY_CARE_PROVIDER_SITE_OTHER): Payer: Self-pay

## 2023-10-21 VITALS — BP 150/91 | HR 69 | Ht 71.0 in | Wt 196.0 lb

## 2023-10-21 DIAGNOSIS — M25562 Pain in left knee: Secondary | ICD-10-CM | POA: Diagnosis not present

## 2023-10-21 DIAGNOSIS — G8929 Other chronic pain: Secondary | ICD-10-CM

## 2023-10-21 NOTE — Progress Notes (Signed)
New Patient Visit  Assessment: Mark [name] is a 72 y.o. male with the following: 1. Chronic pain of left knee  Plan: Mark [name] has chronic pain in the left knee.  This is been ongoing for several years.  He did twist his knee 4-5 years ago.  Radiographs demonstrate mild degenerative changes overall.  He may have a meniscus injury, based on the description of his symptoms.  Currently, he states his pain is that bad.  He has not been as active recently.  We discussed the x-rays in clinic today.  He can continue with Tylenol or NSAIDs as needed.  If this pain worsens, would recommend a steroid injection.  He will contact the clinic if he has any further issues.  Follow-up: Return if symptoms worsen or fail to improve.  Subjective:  Chief Complaint  Patient presents with   Knee Pain    L knee pain for yrs. Pt states knee is starting to catch when he turns a certain and it's happening more often than before     History of Present Illness: Mark [name] is a 72 y.o. male who has been referred by Lilyan Punt, MD for evaluation of left knee pain.  He states has had pain for 4-5 years.  At that time, he remembers stumbling down some stairs, and hyperextending his knee.  He did have some swelling following this incident.  Since then, he has had periodic pain and swelling.  No prior physical therapy.  He has never had a steroid injection.  He does note some occasional catching sensations.  Recently, he states his pain has been better, but he has not been very active.  He likes to walk on a regular basis.  He spends time walking through the woods.  He also spends time chopping wood.  He has not been doing this as much, which she thinks is helping with his pain overall.  He takes Tylenol as needed.  Occasional NSAIDs.   Review of Systems: No fevers or chills No numbness or tingling No chest pain No shortness of breath No bowel or bladder dysfunction No GI distress No  headaches   Medical History:  Past Medical History:  Diagnosis Date   Abnormal nuclear stress test false positive 05/2018   BPH (benign prostatic hyperplasia) 04/21/2019   Chest pain stable CAD with cath 06/07/18 possible GI 06/03/2018   Coronary artery disease    a. s/p CABG 2005. b. cath 05/2018 - patent LIMA-LAD, moderate LM and prox OM disease with neg FFR.   Dyslipidemia    Esophageal stricture    ESOPHAGEAL STRICTURE 12/18/2008   Qualifier: History of  By: Candice Camp CMA (AAMA), Dottie     ESOPHAGITIS 12/18/2008   Qualifier: History of  By: Candice Camp CMA (AAMA), Dottie     Essential hypertension, benign 04/06/2013   Gastrointestinal bleeding    GERD (gastroesophageal reflux disease)    History of colonic polyps 08/26/2014   [facility] [place] during colonoscopy in May of 2015. They recommend repeat colonoscopy May of 2017 do to inadequate prep, pathology showed hyperplastic polyp    Hyperlipidemia    Hypothyroidism    Insomnia 04/21/2019   Obstructive sleep apnea 07/11/2013   Has mixed apnea, CPAP setting 7    OSA (obstructive sleep apnea)    RECTAL BLEEDING 12/18/2008   Qualifier: Diagnosis of  By: Nelson-Smith CMA (AAMA), Dottie     Urinary retention    Vitiligo 07/11/2013  Past Surgical History:  Procedure Laterality Date   ANTERIOR CERVICAL DECOMP/DISCECTOMY FUSION N/A 10/29/2022   Procedure: CERVICAL FIVE-SIX ANTERIOR CERVICAL DECOMPRESSION/DISCECTOMY FUSION;  Surgeon: Donalee Citrin, MD;  Location: [facility] OR;  Service: Neurosurgery;  Laterality: N/A;  3C   APPENDECTOMY     bilateral inguinal hernia repair     CARDIAC CATHETERIZATION  12/2005, 06/2005   CHOLECYSTECTOMY N/A 08/04/2022   Procedure: LAPAROSCOPIC CHOLECYSTECTOMY;  Surgeon: Lewie Chamber, DO;  Location: [facility] ORS;  Service: General;  Laterality: N/A;   COLONOSCOPY     CORONARY ARTERY BYPASS GRAFT  2006   LIMA to LAD, off pump   CORONARY PRESSURE/FFR STUDY N/A  06/07/2018   Procedure: INTRAVASCULAR PRESSURE WIRE/FFR STUDY;  Surgeon: Marykay Lex, MD;  [facility] INVASIVE CV LAB;  Service: Cardiovascular;  Laterality: N/A;   LEFT HEART CATH AND CORS/GRAFTS ANGIOGRAPHY N/A 06/07/2018   Procedure: LEFT HEART CATH AND CORS/GRAFTS ANGIOGRAPHY;  Surgeon: Marykay Lex, MD;  Location: [facility] INVASIVE CV LAB;  Service: Cardiovascular;  Laterality: N/A;   OPEN REDUCTION INTERNAL FIXATION (ORIF) HAND Left 07/21/2015   Procedure: IRRIGATION AND DEBRIDMENT LEFT INDEX AND MIDDLE FINGER, OPEN REDUCTION INTERNAL FIXATION LEFT INDEX FINGER.;  Surgeon: Dominica Severin, MD;  Location: [facility] OR;  Service: Orthopedics;  Laterality: Left;    Family History  Problem Relation Age of Onset   Heart disease Father    Hypertension Father    Social History   Tobacco Use   Smoking status: Former    Current packs/day: 0.00    Average packs/day: 2.0 packs/day for 20.0 years (40.0 ttl pk-yrs)    Types: Cigarettes    Start date: 07/31/1959    Quit date: 07/31/1979    Years since quitting: 44.2   Smokeless tobacco: Former    Types: Chew    Quit date: 07/22/1995  Vaping Use   Vaping status: Never Used  Substance Use Topics   Alcohol use: Yes    Comment: occassional   Drug use: No    Allergies  Allergen Reactions   Mobic [Meloxicam] Rash    Current Meds  Medication Sig   Acetaminophen (TYLENOL 8 HOUR ARTHRITIS PAIN PO) Take by mouth.    Objective: BP (!) 150/91   Pulse 69   Ht 5\' 11"  (1.803 m)   Wt 196 lb (88.9 kg)   BMI 27.34 kg/m   Physical Exam:  General: Alert and oriented. and No acute distress. Gait: Normal gait.  Evaluation left knee demonstrates no swelling.  Negative McMurray's.  Mild tenderness along the medial joint line.  No increased laxity varus or valgus stress.  Negative Lachman.  IMAGING: I personally ordered and reviewed the following images  X-rays of the left knee were obtained in clinic today.  Neutral overall alignment.  No acute  injuries noted.  Small osteophytes appreciated in the medial and lateral compartments.  Mild loss of joint space within the medial compartment only.  Small osteophytes are appreciated about the superior aspect of the patella, as well as the lateral patella.  No bony lesions.  Impression: Negative left x-ray, with mild degenerative changes   New Medications:  No orders of the defined types were placed in this encounter.     Oliver Barre, MD  10/21/2023 10:16 AM

## 2023-10-26 NOTE — Progress Notes (Addendum)
History of Present Illness: This gentleman comes in today for discussion of prostate cancer, recently diagnosed.  Transrectal ultrasound and biopsy was performed on 11.12.2024.  Prostate volume was 25 mL, PSA 5, PSAD 0.20.  5/12 cores revealed adenocarcinoma- -4 cores (right base lateral, right base medial, right mid lateral, right mid medial) revealed GS 4+3 pattern -1 core 4 entheses left apex medial) revealed GS 3+4 pattern  11.14.2024 CT abdomen and pelvis- 1. Enhancing nodular focus along the right posterior aspect of the prostate gland measuring 11 mm, compatible with known prostate cancer. 2. No convincing evidence of metastatic disease in the abdomen or pelvis. 3. 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 Suggest vascular surgery consultation. 4. Subcentimeter hypodensity in the right lobe of the liver measuring 6 mm, nonspecific but favored to reflect a benign lesion such as a cyst or hemangioma. 5. Nonobstructive bilateral renal stones measure up to 7 mm. 6.  Aortic Atherosclerosis  Nomogram predictions- Organ confined disease-41% Lymph nodes/seminal vesicle involvement-12/11% respectively Progression free probability after radical prostatectomy at 5/10 years - 65/49% respectively  He is on Flomax.  IPSS 14/1  Past Medical History:  Diagnosis Date   Abnormal nuclear stress test false positive 05/2018   BPH (benign prostatic hyperplasia) 04/21/2019   Chest pain stable CAD with cath 06/07/18 possible GI 06/03/2018   Coronary artery disease    a. s/p CABG 2005. b. cath 05/2018 - patent LIMA-LAD, moderate LM and prox OM disease with neg FFR.   Dyslipidemia    Esophageal stricture    ESOPHAGEAL STRICTURE 12/18/2008   Qualifier: History of  By: Candice Camp CMA (AAMA), Dottie     ESOPHAGITIS 12/18/2008   Qualifier: History of  By: Candice Camp CMA (AAMA), Dottie     Essential hypertension, benign 04/06/2013    Gastrointestinal bleeding    GERD (gastroesophageal reflux disease)    History of colonic polyps 08/26/2014   [facility] [place] during colonoscopy in May of 2015. They recommend repeat colonoscopy May of 2017 do to inadequate prep, pathology showed hyperplastic polyp    Hyperlipidemia    Hypothyroidism    Insomnia 04/21/2019   Obstructive sleep apnea 07/11/2013   Has mixed apnea, CPAP setting 7    OSA (obstructive sleep apnea)    RECTAL BLEEDING 12/18/2008   Qualifier: Diagnosis of  By: Candice Camp CMA (AAMA), Dottie     Urinary retention    Vitiligo 07/11/2013    Past Surgical History:  Procedure Laterality Date   ANTERIOR CERVICAL DECOMP/DISCECTOMY FUSION N/A 10/29/2022   Procedure: CERVICAL FIVE-SIX ANTERIOR CERVICAL DECOMPRESSION/DISCECTOMY FUSION;  Surgeon: Donalee Citrin, MD;  Location: [facility] OR;  Service: Neurosurgery;  Laterality: N/A;  3C   APPENDECTOMY     bilateral inguinal hernia repair     CARDIAC CATHETERIZATION  12/2005, 06/2005   CHOLECYSTECTOMY N/A 08/04/2022   Procedure: LAPAROSCOPIC CHOLECYSTECTOMY;  Surgeon: Lewie Chamber, DO;  Location: [facility]S;  Service: General;  Laterality: N/A;   COLONOSCOPY     CORONARY ARTERY BYPASS GRAFT  2006   LIMA to LAD, off pump   CORONARY PRESSURE/FFR STUDY N/A 06/07/2018   Procedure: INTRAVASCULAR PRESSURE WIRE/FFR STUDY;  Surgeon: Marykay Lex, MD;  Location: [facility] INVASIVE CV LAB;  Service: Cardiovascular;  Laterality: N/A;   LEFT HEART CATH AND CORS/GRAFTS ANGIOGRAPHY N/A 06/07/2018   Procedure: LEFT HEART CATH AND CORS/GRAFTS ANGIOGRAPHY;  Surgeon: Marykay Lex, MD;  Location: [facility] INVASIVE CV LAB;  Service: Cardiovascular;  Laterality: N/A;   OPEN REDUCTION INTERNAL FIXATION (ORIF) HAND Left 07/21/2015   Procedure: IRRIGATION AND DEBRIDMENT LEFT INDEX AND MIDDLE FINGER, OPEN REDUCTION INTERNAL FIXATION LEFT INDEX FINGER.;  Surgeon: Dominica Severin, MD;  Location: [facility] OR;  Service: Orthopedics;   Laterality: Left;    Home Medications:  Allergies as of 10/27/2023       Reactions   Mobic [meloxicam] Rash        Medication List        Accurate as of October 26, 2023  1:02 PM. If you have any questions, ask your nurse or doctor.          albuterol 108 (90 Base) MCG/ACT inhaler Commonly known as: VENTOLIN HFA TAKE 2 PUFFS BY MOUTH EVERY 6 HOURS AS NEEDED FOR WHEEZE OR SHORTNESS OF BREATH   ALPRAZolam 0.25 MG tablet Commonly known as: XANAX Take 1 tablet (0.25 mg total) by mouth at bedtime as needed.   APPLE CIDER VINEGAR PO Take 1 each by mouth daily. Gummies   aspirin 81 MG tablet Take 1 tablet (81 mg total) by mouth every morning.   augmented betamethasone dipropionate 0.05 % cream Commonly known as: DIPROLENE-AF Apply 1 Application topically 2 (two) times daily as needed (irritation).   cetirizine 10 MG tablet Commonly known as: ZYRTEC Take 5 mg by mouth daily as needed for allergies.   isosorbide mononitrate 30 MG 24 hr tablet Commonly known as: IMDUR TAKE 1 TABLET BY MOUTH EVERY DAY   levothyroxine 100 MCG tablet Commonly known as: SYNTHROID Take 1 tablet (100 mcg total) by mouth daily.   losartan 50 MG tablet Commonly known as: COZAAR TAKE 1 TABLET BY MOUTH EVERY DAY   metoprolol succinate 25 MG 24 hr tablet Commonly known as: TOPROL-XL Take 0.5 tablets (12.5 mg total) by mouth daily.   naproxen 500 MG tablet Commonly known as: NAPROSYN Take 1 tablet (500 mg total) by mouth daily.   nitroGLYCERIN 0.4 MG SL tablet Commonly known as: NITROSTAT Place 0.4 mg under the tongue every 5 (five) minutes as needed for chest pain.   NON FORMULARY Pt uses a cpap nightly   omeprazole 20 MG capsule Commonly known as: PRILOSEC Take 1 capsule (20 mg total) by mouth daily.   rosuvastatin 40 MG tablet Commonly known as: CRESTOR TAKE 1 TABLET BY MOUTH EVERY DAY   SAW PALMETTO PO Take 1 capsule by mouth daily.   tamsulosin 0.4 MG Caps  capsule Commonly known as: FLOMAX Take 1 capsule (0.4 mg total) by mouth daily.   triamcinolone cream 0.1 % Commonly known as: KENALOG Apply 1 application topically 2 (two) times daily.   TYLENOL 8 HOUR ARTHRITIS PAIN PO Take by mouth.        Allergies:  Allergies  Allergen Reactions   Mobic [Meloxicam] Rash    Family History  Problem Relation Age of Onset   Heart disease Father    Hypertension Father     Social History:  reports that he quit smoking about 44 years ago. His smoking use included cigarettes. He started smoking about 64 years ago. He has a 40 pack-year smoking history. He quit smokeless tobacco use about 28 years ago.  His smokeless tobacco use included chew. He reports current alcohol use. He reports that he does not use drugs.  ROS: A complete review of systems was performed.  All systems are negative except for pertinent findings as noted.  Physical Exam:  Vital signs in last 24 hours: There were no vitals taken  for this visit. Constitutional:  Alert and oriented, No acute distress Cardiovascular: Regular rate  Respiratory: Normal respiratory effort Neurologic: Grossly intact, no focal deficits Psychiatric: Normal mood and affect  I have reviewed prior pt notes  I have reviewed urinalysis results  I have independently reviewed prior imaging--prostate ultrasound  I have reviewed prior PSA and pathology results   Impression/Assessment:  Grade group 4 prostate cancer, newly diagnosed.  No evidence of extra prostatic disease  Plan:  The patient (and family member present) was/were counseled about the natural history of prostate cancer and the standard treatment options that are available for prostate cancer. It was explained to him how his age and life expectancy, clinical stage, Gleason score, and PSA affect his prognosis, the decision to proceed with additional staging studies, as well as how that information influences recommended treatment  strategies. We discussed the roles for active surveillance, radiation therapy, surgical therapy, androgen deprivation, as well as ablative therapy options for the treatment of prostate cancer as appropriate to his individual cancer situation. We discussed the risks and benefits of these options with regard to their impact on cancer control and also in terms of potential adverse events, complications, and impact on quality of life particularly related to urinary, bowel, and sexual function. The patient was encouraged to ask questions throughout the discussion today and all questions were answered to his stated satisfaction. In addition, the patient was provided with and/or directed to appropriate resources and literature for further education about prostate cancer and treatment options.  He is interested in radiotherapy.  I will send him to Dr. Kathrynn Running to discuss his options.  A total of 69 minutes was spent in management of this pt, including review of all radiographic images and pathologic test results, past records as well as direct face to face consultation with the pt.

## 2023-10-27 ENCOUNTER — Ambulatory Visit: Payer: Medicare PPO | Admitting: Urology

## 2023-10-27 ENCOUNTER — Ambulatory Visit: Payer: Medicare PPO | Admitting: Vascular Surgery

## 2023-10-27 ENCOUNTER — Encounter: Payer: Self-pay | Admitting: Vascular Surgery

## 2023-10-27 VITALS — BP 148/87 | HR 61

## 2023-10-27 VITALS — BP 124/81 | HR 67 | Temp 97.9°F | Ht 71.0 in | Wt 201.2 lb

## 2023-10-27 DIAGNOSIS — I723 Aneurysm of iliac artery: Secondary | ICD-10-CM | POA: Insufficient documentation

## 2023-10-27 DIAGNOSIS — C61 Malignant neoplasm of prostate: Secondary | ICD-10-CM

## 2023-10-27 DIAGNOSIS — R972 Elevated prostate specific antigen [PSA]: Secondary | ICD-10-CM

## 2023-10-27 NOTE — Progress Notes (Signed)
Patient name: Mark [name] MRN: [id number] DOB: [birth year] Sex: male  REASON FOR CONSULT: Right internal iliac aneurysm  HPI: Mark [name] is a 72 y.o. 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  Patient states no prior knowledge of the aneurysm.  This was discovered on CT scan from 10/05/2023 for workup of his prostate cancer.  States he has a little bit of upper epigastric discomfort in the morning that gets better with Pepto-Bismol.  No other abdominal pain.  No history of pelvic radiation.  States he is seeing his urologist today to discuss treatment of his prostate cancer.   Past Medical History:  Diagnosis Date   Abnormal nuclear stress test false positive 05/2018   BPH (benign prostatic hyperplasia) 04/21/2019   Chest pain stable CAD with cath 06/07/18 possible GI 06/03/2018   Coronary artery disease    a. s/p CABG 2005. b. cath 05/2018 - patent LIMA-LAD, moderate LM and prox OM disease with neg FFR.   Dyslipidemia    Esophageal stricture    ESOPHAGEAL STRICTURE 12/18/2008   Qualifier: History of  By: Candice Camp CMA (AAMA), Dottie     ESOPHAGITIS 12/18/2008   Qualifier: History of  By: Candice Camp CMA (AAMA), Dottie     Essential hypertension, benign 04/06/2013   Gastrointestinal bleeding    GERD (gastroesophageal reflux disease)    History of colonic polyps 08/26/2014   [facility] [place] during colonoscopy in May of 2015. They recommend repeat colonoscopy May of 2017 do to inadequate prep, pathology showed hyperplastic polyp    Hyperlipidemia    Hypothyroidism    Insomnia 04/21/2019   Obstructive sleep apnea 07/11/2013   Has mixed apnea, CPAP setting 7    OSA (obstructive sleep apnea)    RECTAL BLEEDING 12/18/2008   Qualifier: Diagnosis of  By: Candice Camp CMA (AAMA), Dottie      Urinary retention    Vitiligo 07/11/2013    Past Surgical History:  Procedure Laterality Date   ANTERIOR CERVICAL DECOMP/DISCECTOMY FUSION N/A 10/29/2022   Procedure: CERVICAL FIVE-SIX ANTERIOR CERVICAL DECOMPRESSION/DISCECTOMY FUSION;  Surgeon: Donalee Citrin, MD;  Location: [facility] OR;  Service: Neurosurgery;  Laterality: N/A;  3C   APPENDECTOMY     bilateral inguinal hernia repair     CARDIAC CATHETERIZATION  12/2005, 06/2005   CHOLECYSTECTOMY N/A 08/04/2022   Procedure: LAPAROSCOPIC CHOLECYSTECTOMY;  Surgeon: Lewie Chamber, DO;  Location: [facility] ORS;  Service: General;  Laterality: N/A;   COLONOSCOPY     CORONARY ARTERY BYPASS GRAFT  2006   LIMA to LAD, off pump   CORONARY PRESSURE/FFR STUDY N/A 06/07/2018   Procedure: INTRAVASCULAR PRESSURE WIRE/FFR STUDY;  Surgeon: Marykay Lex, MD;  Location: [facility] INVASIVE CV LAB;  Service: Cardiovascular;  Laterality: N/A;   LEFT HEART CATH AND CORS/GRAFTS ANGIOGRAPHY N/A 06/07/2018   Procedure: LEFT HEART CATH AND CORS/GRAFTS ANGIOGRAPHY;  Surgeon: Marykay Lex, MD;  Location: [facility] INVASIVE CV LAB;  Service: Cardiovascular;  Laterality: N/A;   OPEN REDUCTION INTERNAL FIXATION (ORIF) HAND Left 07/21/2015   Procedure: IRRIGATION AND DEBRIDMENT LEFT INDEX AND MIDDLE FINGER, OPEN REDUCTION INTERNAL FIXATION LEFT INDEX FINGER.;  Surgeon: Dominica Severin, MD;  Location: [facility] OR;  Service: Orthopedics;  Laterality: Left;    Family History  Problem Relation Age of Onset   Heart disease Father    Hypertension Father     SOCIAL HISTORY: Social History  Socioeconomic History   Marital status: Married    Spouse name: Not on file   Number of children: Not on file   Years of education: Not on file   Highest education level: 12th grade  Occupational History   Occupation: Engineer, agricultural: [employer]  Tobacco Use   Smoking status: Former    Current packs/day: 0.00    Average packs/day: 2.0 packs/day for 20.0 years (40.0 ttl pk-yrs)    Types:  Cigarettes    Start date: 07/31/1959    Quit date: 07/31/1979    Years since quitting: 44.2   Smokeless tobacco: Former    Types: Chew    Quit date: 07/22/1995  Vaping Use   Vaping status: Never Used  Substance and Sexual Activity   Alcohol use: Yes    Comment: occassional   Drug use: No   Sexual activity: Yes  Other Topics Concern   Not on file  Social History Narrative   Not on file   Social Determinants of Health   Financial Resource Strain: Low Risk  (10/07/2023)   Overall Financial Resource Strain (CARDIA)    Difficulty of Paying Living Expenses: Not very hard  Food Insecurity: No Food Insecurity (10/07/2023)   Hunger Vital Sign    Worried About Running Out of Food in the Last Year: Never true    Ran Out of Food in the Last Year: Never true  Transportation Needs: No Transportation Needs (10/07/2023)   PRAPARE - Administrator, Civil Service (Medical): No    Lack of Transportation (Non-Medical): No  Physical Activity: Insufficiently Active (10/07/2023)   Exercise Vital Sign    Days of Exercise per Week: 3 days    Minutes of Exercise per Session: 30 min  Stress: No Stress Concern Present (10/07/2023)   [employer] of Occupational Health - Occupational Stress Questionnaire    Feeling of Stress : Only a little  Social Connections: Unknown (10/07/2023)   Social Connection and Isolation Panel [NHANES]    Frequency of Communication with Friends and Family: Three times a week    Frequency of Social Gatherings with Friends and Family: More than three times a week    Attends Religious Services: 1 to 4 times per year    Active Member of [employer] or Organizations: Patient declined    Attends Engineer, structural: Not on file    Marital Status: Married  Catering manager Violence: Not on file    Allergies  Allergen Reactions   Mobic [Meloxicam] Rash    Current Outpatient Medications  Medication Sig Dispense Refill   Acetaminophen (TYLENOL 8 HOUR  ARTHRITIS PAIN PO) Take by mouth.     albuterol (VENTOLIN HFA) 108 (90 Base) MCG/ACT inhaler TAKE 2 PUFFS BY MOUTH EVERY 6 HOURS AS NEEDED FOR WHEEZE OR SHORTNESS OF BREATH 18 each 6   ALPRAZolam (XANAX) 0.25 MG tablet Take 1 tablet (0.25 mg total) by mouth at bedtime as needed. 30 tablet 2   APPLE CIDER VINEGAR PO Take 1 each by mouth daily. Gummies     aspirin 81 MG tablet Take 1 tablet (81 mg total) by mouth every morning. 30 tablet    augmented betamethasone dipropionate (DIPROLENE-AF) 0.05 % cream Apply 1 Application topically 2 (two) times daily as needed (irritation).     cetirizine (ZYRTEC) 10 MG tablet Take 5 mg by mouth daily as needed for allergies.     isosorbide mononitrate (IMDUR) 30 MG 24 hr tablet TAKE 1 TABLET BY  MOUTH EVERY DAY 90 tablet 2   levothyroxine (SYNTHROID) 100 MCG tablet Take 1 tablet (100 mcg total) by mouth daily. 90 tablet 1   losartan (COZAAR) 50 MG tablet TAKE 1 TABLET BY MOUTH EVERY DAY 90 tablet 1   metoprolol succinate (TOPROL-XL) 25 MG 24 hr tablet Take 0.5 tablets (12.5 mg total) by mouth daily. 45 tablet 1   naproxen (NAPROSYN) 500 MG tablet Take 1 tablet (500 mg total) by mouth daily.     nitroGLYCERIN (NITROSTAT) 0.4 MG SL tablet Place 0.4 mg under the tongue every 5 (five) minutes as needed for chest pain.     NON FORMULARY Pt uses a cpap nightly     omeprazole (PRILOSEC) 20 MG capsule Take 1 capsule (20 mg total) by mouth daily. 90 capsule 1   rosuvastatin (CRESTOR) 40 MG tablet TAKE 1 TABLET BY MOUTH EVERY DAY 90 tablet 1   Saw Palmetto, Serenoa repens, (SAW PALMETTO PO) Take 1 capsule by mouth daily.     tamsulosin (FLOMAX) 0.4 MG CAPS capsule Take 1 capsule (0.4 mg total) by mouth daily. 90 capsule 1   triamcinolone cream (KENALOG) 0.1 % Apply 1 application topically 2 (two) times daily. 45 g 4   No current facility-administered medications for this visit.    REVIEW OF SYSTEMS:  [X]  denotes positive finding, [ ]  denotes negative finding Cardiac   Comments:  Chest pain or chest pressure:    Shortness of breath upon exertion:    Short of breath when lying flat:    Irregular heart rhythm:        Vascular    Pain in calf, thigh, or hip brought on by ambulation:    Pain in feet at night that wakes you up from your sleep:     Blood clot in your veins:    Leg swelling:         Pulmonary    Oxygen at home:    Productive cough:     Wheezing:         Neurologic    Sudden weakness in arms or legs:     Sudden numbness in arms or legs:     Sudden onset of difficulty speaking or slurred speech:    Temporary loss of vision in one eye:     Problems with dizziness:         Gastrointestinal    Blood in stool:     Vomited blood:         Genitourinary    Burning when urinating:     Blood in urine:        Psychiatric    Major depression:         Hematologic    Bleeding problems:    Problems with blood clotting too easily:        Skin    Rashes or ulcers:        Constitutional    Fever or chills:      PHYSICAL EXAM: Vitals:   10/27/23 1008  BP: 124/81  Pulse: 67  Temp: 97.9 F (36.6 C)  TempSrc: Temporal  SpO2: 92%  Weight: 201 lb 3.2 oz (91.3 kg)  Height: 5\' 11"  (1.803 m)    GENERAL: The patient is a well-nourished male, in no acute distress. The vital signs are documented above. CARDIAC: There is a regular rate and rhythm.  VASCULAR:  Bilateral femoral pulses palpable Bilateral DP pulses palpable PULMONARY: No respiratory distress ABDOMEN: Soft and non-tender. MUSCULOSKELETAL: There are  no major deformities or cyanosis. NEUROLOGIC: No focal weakness or paresthesias are detected. SKIN: There are no ulcers or rashes noted. PSYCHIATRIC: The patient has a normal affect.  DATA:   CT abdomen/pelvis 10/05/23:  IMPRESSION: 1. Enhancing nodular focus along the right posterior aspect of the prostate gland measuring 11 mm, compatible with known prostate cancer. 2. No convincing evidence of metastatic disease in  the abdomen or pelvis. 3. 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 Suggest vascular surgery consultation. 4. Subcentimeter hypodensity in the right lobe of the liver measuring 6 mm, nonspecific but favored to reflect a benign lesion such as a cyst or hemangioma. 5. Nonobstructive bilateral renal stones measure up to 7 mm. 6.  Aortic Atherosclerosis (ICD10-I70.0).     Electronically Signed   By: Maudry Mayhew M.D.   On: 10/10/2023 08:35  Assessment/Plan:  72 y.o. 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  I reviewed his CT scan with the patient and discussed he has a 3.5 cm right internal iliac artery aneurysm.  I discussed I would recommend surgical intervention.  I discussed guidelines are to repair these at greater than 3 to 3.5 cm given risk of rupture.  I think we can treat this with coil embolization in the Cath Lab from a transfemoral approach.  I discussed risk and benefits including risk of vessel injury, pelvic ischemia etc.  He does have a patent left hypogastric so I think his risk pelvic ischemia is low.  Will get scheduled his convenience.  Discussed this being done under moderate sedation.    Cephus Shelling, MD [facility] Office: [phone]

## 2023-10-28 LAB — URINALYSIS, ROUTINE W REFLEX MICROSCOPIC
Glucose, UA: NEGATIVE
Ketones, UA: NEGATIVE
Protein,UA: NEGATIVE
Specific Gravity, UA: 1.02 (ref 1.005–1.030)
Urobilinogen, Ur: 1 mg/dL (ref 0.2–1.0)
pH, UA: 6 (ref 5.0–7.5)

## 2023-10-28 LAB — MICROSCOPIC EXAMINATION: Bacteria, UA: NONE SEEN

## 2023-10-29 ENCOUNTER — Other Ambulatory Visit: Payer: Self-pay

## 2023-10-29 DIAGNOSIS — I723 Aneurysm of iliac artery: Secondary | ICD-10-CM

## 2023-10-30 ENCOUNTER — Encounter: Payer: Self-pay | Admitting: Radiation Oncology

## 2023-10-30 NOTE — Progress Notes (Signed)
GU Location of Tumor / Histology: Prostate Ca  If Prostate Cancer, Gleason Score is (4 + 3) and PSA is (5.0 on 08/04/2023)  PSA 5.0 on 06/12/2023 PSA 4.6 on 06/10/2023 PSA 2.8 on 11/12/2021  Biopsies      10/01/2023 Dr. Marcine Matar CT Abdomen Pelvis with Contrast CLINICAL DATA:  Prostate cancer, intermediate risk, staging. * Tracking Code: BO *  IMPRESSION: 1. Enhancing nodular focus along the right posterior aspect of the prostate gland measuring 11 mm, compatible with known prostate cancer. 2. No convincing evidence of metastatic disease in the abdomen or pelvis. 3. 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 Suggest vascular surgery consultation. 4. Subcentimeter hypodensity in the right lobe of the liver measuring 6 mm, nonspecific but favored to reflect a benign lesion such as a cyst or hemangioma. 5. Nonobstructive bilateral renal stones measure up to 7 mm. 6.  Aortic Atherosclerosis (ICD10-I70.0).  Past/Anticipated interventions by urology, if any:  Dr. Marcine Matar    Past/Anticipated interventions by medical oncology, if any: NA  Weight changes, if any: {:18581}  IPSS: SHIM:  Bowel/Bladder complaints, if any: {:18581}   Nausea/Vomiting, if any: {:18581}  Pain issues, if any:  {:18581}  SAFETY ISSUES: Prior radiation? {:18581} Pacemaker/ICD? {:18581} Possible current pregnancy? Male Is the patient on methotrexate? No  Current Complaints / other details:

## 2023-11-03 ENCOUNTER — Ambulatory Visit
Admission: RE | Admit: 2023-11-03 | Discharge: 2023-11-03 | Disposition: A | Discharge status: Home / Self Care | Payer: Medicare PPO | Source: Ambulatory Visit | Attending: Radiation Oncology | Admitting: Radiation Oncology

## 2023-11-03 ENCOUNTER — Encounter: Payer: Self-pay | Admitting: Radiation Oncology

## 2023-11-03 VITALS — BP 135/86 | HR 65 | Temp 97.5°F | Resp 18 | Ht 71.0 in | Wt 198.5 lb

## 2023-11-03 DIAGNOSIS — Z791 Long term (current) use of non-steroidal anti-inflammatories (NSAID): Secondary | ICD-10-CM | POA: Diagnosis not present

## 2023-11-03 DIAGNOSIS — N400 Enlarged prostate without lower urinary tract symptoms: Secondary | ICD-10-CM | POA: Insufficient documentation

## 2023-11-03 DIAGNOSIS — I10 Essential (primary) hypertension: Secondary | ICD-10-CM | POA: Diagnosis not present

## 2023-11-03 DIAGNOSIS — Z191 Hormone sensitive malignancy status: Secondary | ICD-10-CM | POA: Diagnosis not present

## 2023-11-03 DIAGNOSIS — Z860101 Personal history of adenomatous and serrated colon polyps: Secondary | ICD-10-CM | POA: Insufficient documentation

## 2023-11-03 DIAGNOSIS — Z7982 Long term (current) use of aspirin: Secondary | ICD-10-CM | POA: Insufficient documentation

## 2023-11-03 DIAGNOSIS — I700 Atherosclerosis of aorta: Secondary | ICD-10-CM | POA: Insufficient documentation

## 2023-11-03 DIAGNOSIS — K7689 Other specified diseases of liver: Secondary | ICD-10-CM | POA: Insufficient documentation

## 2023-11-03 DIAGNOSIS — N200 Calculus of kidney: Secondary | ICD-10-CM | POA: Insufficient documentation

## 2023-11-03 DIAGNOSIS — E039 Hypothyroidism, unspecified: Secondary | ICD-10-CM | POA: Diagnosis not present

## 2023-11-03 DIAGNOSIS — Z79899 Other long term (current) drug therapy: Secondary | ICD-10-CM | POA: Insufficient documentation

## 2023-11-03 DIAGNOSIS — E785 Hyperlipidemia, unspecified: Secondary | ICD-10-CM | POA: Diagnosis not present

## 2023-11-03 DIAGNOSIS — Z79890 Hormone replacement therapy (postmenopausal): Secondary | ICD-10-CM | POA: Insufficient documentation

## 2023-11-03 DIAGNOSIS — K219 Gastro-esophageal reflux disease without esophagitis: Secondary | ICD-10-CM | POA: Insufficient documentation

## 2023-11-03 DIAGNOSIS — Z87891 Personal history of nicotine dependence: Secondary | ICD-10-CM | POA: Insufficient documentation

## 2023-11-03 DIAGNOSIS — G4730 Sleep apnea, unspecified: Secondary | ICD-10-CM | POA: Insufficient documentation

## 2023-11-03 DIAGNOSIS — I2510 Atherosclerotic heart disease of native coronary artery without angina pectoris: Secondary | ICD-10-CM | POA: Insufficient documentation

## 2023-11-03 DIAGNOSIS — C61 Malignant neoplasm of prostate: Secondary | ICD-10-CM | POA: Insufficient documentation

## 2023-11-03 HISTORY — DX: Elevated prostate specific antigen (PSA): R97.20

## 2023-11-03 NOTE — Progress Notes (Signed)
Radiation Oncology         [phone]) [phone] ________________________________  Initial Outpatient Consultation  Name: Mark [name] MRN: [id number]  Date: 11/03/2023  DOB: [birth year]  GL:OVFIEP, Jonna Coup, MD  Marcine Matar, MD   REFERRING PHYSICIAN: Marcine Matar, MD  DIAGNOSIS: 72 y.o. gentleman with Stage T1c adenocarcinoma of the prostate with Gleason score of 4+3, and PSA of 5.    ICD-10-CM   1. Malignant neoplasm of prostate (HCC)  C61       HISTORY OF PRESENT ILLNESS: Mark [name] is a 72 y.o. male with a diagnosis of prostate cancer. He was noted to have an elevated PSA of 4.6 on routine labs in 05/2023, by his primary care physician, Dr. Gerda Diss.  The PSA was repeated just a few days later and remained elevated at 5.0.  Accordingly, he was referred for evaluation in urology by Dr. Retta Diones on 08/04/23,  digital rectal examination performed at that time showed no nodules or induration. A repeat PSA obtained that day showed stable elevation at 5. The patient proceeded to transrectal ultrasound with 12 biopsies of the prostate on 09/29/23.  The prostate volume measured 25 cc.  Out of 12 core biopsies, 5 were positive.  The maximum Gleason score was 4+3, and this was seen in the right base, right mid, right base lateral (with perineural invasion), and right mid lateral. Additionally, a small focus of Gleason 3+4 was seen in the left apex.  A CT A/P was performed on 10/01/2023 for disease staging and was without any evidence of metastatic disease.  The patient reviewed the biopsy and imaging results with his urologist and he has kindly been referred today for discussion of potential radiation treatment options.   PREVIOUS RADIATION THERAPY: No  PAST MEDICAL HISTORY:  Past Medical History:  Diagnosis Date   Abnormal nuclear stress test false positive 05/2018   BPH (benign prostatic hyperplasia) 04/21/2019   Chest pain stable CAD with cath 06/07/18 possible GI  06/03/2018   Coronary artery disease    a. s/p CABG 2005. b. cath 05/2018 - patent LIMA-LAD, moderate LM and prox OM disease with neg FFR.   Dyslipidemia    Elevated PSA    Esophageal stricture    ESOPHAGEAL STRICTURE 12/18/2008   Qualifier: History of  By: Candice Camp CMA (AAMA), Dottie     ESOPHAGITIS 12/18/2008   Qualifier: History of  By: Candice Camp CMA (AAMA), Dottie     Essential hypertension, benign 04/06/2013   Gastrointestinal bleeding    GERD (gastroesophageal reflux disease)    History of colonic polyps 08/26/2014   [facility] [place] during colonoscopy in May of 2015. They recommend repeat colonoscopy May of 2017 do to inadequate prep, pathology showed hyperplastic polyp    Hyperlipidemia    Hypothyroidism    Insomnia 04/21/2019   Obstructive sleep apnea 07/11/2013   Has mixed apnea, CPAP setting 7    OSA (obstructive sleep apnea)    RECTAL BLEEDING 12/18/2008   Qualifier: Diagnosis of  By: Candice Camp CMA (AAMA), Dottie     Urinary retention    Vitiligo 07/11/2013      PAST SURGICAL HISTORY: Past Surgical History:  Procedure Laterality Date   ANTERIOR CERVICAL DECOMP/DISCECTOMY FUSION N/A 10/29/2022   Procedure: CERVICAL FIVE-SIX ANTERIOR CERVICAL DECOMPRESSION/DISCECTOMY FUSION;  Surgeon: Donalee Citrin, MD;  Location: [facility] OR;  Service: Neurosurgery;  Laterality: N/A;  3C   APPENDECTOMY     bilateral inguinal hernia repair     CARDIAC CATHETERIZATION  12/2005, 06/2005   CHOLECYSTECTOMY N/A 08/04/2022   Procedure: LAPAROSCOPIC CHOLECYSTECTOMY;  Surgeon: Lewie Chamber, DO;  Location: [facility] ORS;  Service: General;  Laterality: N/A;   COLONOSCOPY     CORONARY ARTERY BYPASS GRAFT  11/17/2004   LIMA to LAD, off pump   CORONARY PRESSURE/FFR STUDY N/A 06/07/2018   Procedure: INTRAVASCULAR PRESSURE WIRE/FFR STUDY;  Surgeon: Marykay Lex, MD;  Location: [facility] INVASIVE CV LAB;  Service: Cardiovascular;  Laterality: N/A;   LEFT HEART CATH  AND CORS/GRAFTS ANGIOGRAPHY N/A 06/07/2018   Procedure: LEFT HEART CATH AND CORS/GRAFTS ANGIOGRAPHY;  Surgeon: Marykay Lex, MD;  Location: [facility] INVASIVE CV LAB;  Service: Cardiovascular;  Laterality: N/A;   OPEN REDUCTION INTERNAL FIXATION (ORIF) HAND Left 07/21/2015   Procedure: IRRIGATION AND DEBRIDMENT LEFT INDEX AND MIDDLE FINGER, OPEN REDUCTION INTERNAL FIXATION LEFT INDEX FINGER.;  Surgeon: Dominica Severin, MD;  Location: [facility] OR;  Service: Orthopedics;  Laterality: Left;   PROSTATE BIOPSY      FAMILY HISTORY:  Family History  Problem Relation Age of Onset   Heart disease Father    Hypertension Father     SOCIAL HISTORY:  Social History   Socioeconomic History   Marital status: Married    Spouse name: Not on file   Number of children: Not on file   Years of education: Not on file   Highest education level: 12th grade  Occupational History   Occupation: Engineer, agricultural: [employer]   Tobacco Use   Smoking status: Former    Current packs/day: 0.00    Average packs/day: 2.0 packs/day for 20.0 years (40.0 ttl pk-yrs)    Types: Cigarettes    Start date: 07/31/1959    Quit date: 07/31/1979    Years since quitting: 44.2   Smokeless tobacco: Former    Types: Chew    Quit date: 07/22/1995  Vaping Use   Vaping status: Never Used  Substance and Sexual Activity   Alcohol use: Yes    Comment: occassional   Drug use: No   Sexual activity: Yes  Other Topics Concern   Not on file  Social History Narrative   Not on file   Social Drivers of Health   Financial Resource Strain: Low Risk  (10/07/2023)   Overall Financial Resource Strain (CARDIA)    Difficulty of Paying Living Expenses: Not very hard  Food Insecurity: No Food Insecurity (11/03/2023)   Hunger Vital Sign    Worried About Running Out of Food in the Last Year: Never true    Ran Out of Food in the Last Year: Never true  Transportation Needs: No Transportation Needs (11/03/2023)   PRAPARE - Therapist, art (Medical): No    Lack of Transportation (Non-Medical): No  Physical Activity: Insufficiently Active (10/07/2023)   Exercise Vital Sign    Days of Exercise per Week: 3 days    Minutes of Exercise per Session: 30 min  Stress: No Stress Concern Present (10/07/2023)   [employer] of Occupational Health - Occupational Stress Questionnaire    Feeling of Stress : Only a little  Social Connections: Unknown (10/07/2023)   Social Connection and Isolation Panel [NHANES]    Frequency of Communication with Friends and Family: Three times a week    Frequency of Social Gatherings with Friends and Family: More than three times a week    Attends Religious Services: 1 to 4 times per year    Active Member of [employer] or Organizations: Patient declined  Attends Banker Meetings: Not on file    Marital Status: Married  Intimate Partner Violence: Not At Risk (11/03/2023)   Humiliation, Afraid, Rape, and Kick questionnaire    Fear of Current or Ex-Partner: No    Emotionally Abused: No    Physically Abused: No    Sexually Abused: No    ALLERGIES: Mobic [meloxicam]  MEDICATIONS:  Current Outpatient Medications  Medication Sig Dispense Refill   Acetaminophen (TYLENOL 8 HOUR ARTHRITIS PAIN PO) Take by mouth.     albuterol (VENTOLIN HFA) 108 (90 Base) MCG/ACT inhaler TAKE 2 PUFFS BY MOUTH EVERY 6 HOURS AS NEEDED FOR WHEEZE OR SHORTNESS OF BREATH 18 each 6   ALPRAZolam (XANAX) 0.25 MG tablet Take 1 tablet (0.25 mg total) by mouth at bedtime as needed. 30 tablet 2   APPLE CIDER VINEGAR PO Take 1 each by mouth daily. Gummies     aspirin 81 MG tablet Take 1 tablet (81 mg total) by mouth every morning. 30 tablet    augmented betamethasone dipropionate (DIPROLENE-AF) 0.05 % cream Apply 1 Application topically 2 (two) times daily as needed (irritation).     cetirizine (ZYRTEC) 10 MG tablet Take 5 mg by mouth daily as needed for allergies.     isosorbide mononitrate (IMDUR)  30 MG 24 hr tablet TAKE 1 TABLET BY MOUTH EVERY DAY 90 tablet 2   levothyroxine (SYNTHROID) 100 MCG tablet Take 1 tablet (100 mcg total) by mouth daily. 90 tablet 1   losartan (COZAAR) 50 MG tablet TAKE 1 TABLET BY MOUTH EVERY DAY 90 tablet 1   metoprolol succinate (TOPROL-XL) 25 MG 24 hr tablet Take 0.5 tablets (12.5 mg total) by mouth daily. 45 tablet 1   naproxen (NAPROSYN) 500 MG tablet Take 1 tablet (500 mg total) by mouth daily.     nitroGLYCERIN (NITROSTAT) 0.4 MG SL tablet Place 0.4 mg under the tongue every 5 (five) minutes as needed for chest pain.     NON FORMULARY Pt uses a cpap nightly     omeprazole (PRILOSEC) 20 MG capsule Take 1 capsule (20 mg total) by mouth daily. 90 capsule 1   rosuvastatin (CRESTOR) 40 MG tablet TAKE 1 TABLET BY MOUTH EVERY DAY 90 tablet 1   Saw Palmetto, Serenoa repens, (SAW PALMETTO PO) Take 1 capsule by mouth daily.     tamsulosin (FLOMAX) 0.4 MG CAPS capsule Take 1 capsule (0.4 mg total) by mouth daily. 90 capsule 1   triamcinolone cream (KENALOG) 0.1 % Apply 1 application topically 2 (two) times daily. 45 g 4   No current facility-administered medications for this encounter.    REVIEW OF SYSTEMS:  On review of systems, the patient reports that he is doing well overall. He denies any chest pain, shortness of breath, cough, fevers, chills, night sweats, unintended weight changes. He denies any bowel disturbances, and denies abdominal pain, nausea or vomiting. He denies any new musculoskeletal or joint aches or pains. His IPSS was 15, indicating moderate urinary symptoms with urgency and frequency but no obstructive voiding symptoms.  He has continued taking Flomax daily as prescribed.  His SHIM was 6, indicating he has severe erectile dysfunction. A complete review of systems is obtained and is otherwise negative.    PHYSICAL EXAM:  Wt Readings from Last 3 Encounters:  11/03/23 198 lb 8 oz (90 kg)  10/27/23 201 lb 3.2 oz (91.3 kg)  10/21/23 196 lb (88.9  kg)   Temp Readings from Last 3 Encounters:  11/03/23 (!) 97.5  F (36.4 C) (Temporal)  10/27/23 97.9 F (36.6 C) (Temporal)  10/07/23 98.1 F (36.7 C)   BP Readings from Last 3 Encounters:  11/03/23 135/86  10/27/23 (!) 148/87  10/27/23 124/81   Pulse Readings from Last 3 Encounters:  11/03/23 65  10/27/23 61  10/27/23 67   Pain Assessment Pain Score: 0-No pain/10  In general this is a well appearing Caucasian male in no acute distress. He's alert and oriented x4 and appropriate throughout the examination. Cardiopulmonary assessment is negative for acute distress, and he exhibits normal effort.     KPS = 100  100 - Normal; no complaints; no evidence of disease. 90   - Able to carry on normal activity; minor signs or symptoms of disease. 80   - Normal activity with effort; some signs or symptoms of disease. 29   - Cares for self; unable to carry on normal activity or to do active work. 60   - Requires occasional assistance, but is able to care for most of his personal needs. 50   - Requires considerable assistance and frequent medical care. 40   - Disabled; requires special care and assistance. 30   - Severely disabled; hospital admission is indicated although death not imminent. 20   - Very sick; hospital admission necessary; active supportive treatment necessary. 10   - Moribund; fatal processes progressing rapidly. 0     - Dead  Karnofsky DA, Abelmann WH, Craver LS and Eastlake JH [phone]) The use of the nitrogen mustards in the palliative treatment of carcinoma: with particular reference to bronchogenic carcinoma Cancer 1 [id number]  LABORATORY DATA:  Lab Results  Component Value Date   WBC 4.6 06/10/2023   HGB 14.6 06/10/2023   HCT 43.8 06/10/2023   MCV 93 06/10/2023   PLT 164 06/10/2023   Lab Results  Component Value Date   NA 141 06/10/2023   K 4.6 06/10/2023   CL 103 06/10/2023   CO2 22 06/10/2023   Lab Results  Component Value Date   ALT 12 06/10/2023    AST 18 06/10/2023   ALKPHOS 51 06/10/2023   BILITOT 1.4 (H) 06/10/2023     RADIOGRAPHY: DG Knee 4 Views W/Patella Left Result Date: 10/26/2023 X-rays of the left knee were obtained in clinic today.  Neutral overall alignment.  No acute injuries noted.  Small osteophytes appreciated in the medial and lateral compartments.  Mild loss of joint space within the medial compartment only.  Small osteophytes are appreciated about the superior aspect of the patella, as well as the lateral patella.  No bony lesions.  Impression: Negative left x-ray, with mild degenerative changes    CT ABDOMEN PELVIS W CONTRAST Result Date: 10/10/2023 CLINICAL DATA:  Prostate cancer, intermediate risk, staging. * Tracking Code: BO * EXAM: CT ABDOMEN AND PELVIS WITH CONTRAST TECHNIQUE: Multidetector CT imaging of the abdomen and pelvis was performed using the standard protocol following bolus administration of intravenous contrast. RADIATION DOSE REDUCTION: This exam was performed according to the departmental dose-optimization program which includes automated exposure control, adjustment of the mA and/or kV according to patient size and/or use of iterative reconstruction technique. CONTRAST:  OMNIPAQUE IOHEXOL 300 MG/ML  SOLN COMPARISON:  None Available. FINDINGS: Lower chest: No acute abnormality. Hepatobiliary: Subcentimeter hypodensity in the right lobe of the liver measuring 6 mm on image 24/2 gallbladder is surgically absent. No biliary ductal dilation. Pancreas: No pancreatic ductal dilation or evidence of acute inflammation. Spleen: No splenomegaly. Adrenals/Urinary Tract: Bilateral adrenal glands  appear normal. No hydronephrosis. Right renal cysts are considered benign and requiring no independent imaging follow-up. Additional subcentimeter right renal lesion technically too small to accurately characterize but statistically likely to reflect a cyst this is considered benign requiring no independent imaging follow-up.  Nonobstructive bilateral renal stones measure up to 7 mm. Urinary bladder is unremarkable for degree of distension. Stomach/Bowel: No radiopaque enteric contrast material was administered. Stomach is unremarkable for degree of distension. No pathologic dilation of small or large bowel. No evidence of acute bowel inflammation. Vascular/Lymphatic: Aortic atherosclerosis. 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 Smooth IVC contours. The portal, splenic and superior mesenteric veins are patent. No pathologically enlarged abdominal or pelvic lymph nodes. Reproductive: Enhancing nodular focus along the right posterior aspect of the prostate gland measuring 11 mm on image 87/2. Other: No significant abdominopelvic free fluid. Musculoskeletal: Multilevel degenerative change of the spine. No aggressive lytic or blastic lesion of bone. IMPRESSION: 1. Enhancing nodular focus along the right posterior aspect of the prostate gland measuring 11 mm, compatible with known prostate cancer. 2. No convincing evidence of metastatic disease in the abdomen or pelvis. 3. 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 Suggest vascular surgery consultation. 4. Subcentimeter hypodensity in the right lobe of the liver measuring 6 mm, nonspecific but favored to reflect a benign lesion such as a cyst or hemangioma. 5. Nonobstructive bilateral renal stones measure up to 7 mm. 6.  Aortic Atherosclerosis (ICD10-I70.0). Electronically Signed   By: Maudry Mayhew M.D.   On: 10/10/2023 08:35      IMPRESSION/PLAN: 1. 72 y.o. gentleman with Stage T1c adenocarcinoma of the prostate with Gleason Score of 4+3, and PSA of 5. We discussed the patient's workup and outlined the nature of prostate cancer in this setting. The patient's T stage, Gleason's score, and PSA put him into the unfavorable intermediate risk group.  Accordingly, he is eligible for a variety of potential treatment options including prostatectomy, brachytherapy or 5.5 weeks of external radiation +/- ST-ADT. We discussed the available radiation techniques, and focused on the details and logistics of delivery. We discussed and outlined the risks, benefits, short and long-term effects associated with radiotherapy and compared and contrasted these with prostatectomy. We discussed the role of SpaceOAR gel in reducing the rectal toxicity associated with radiotherapy.  He appears to have a good understanding of his disease and our treatment recommendations which are of curative intent.  He was encouraged to ask questions that were answered to his stated satisfaction.  At the conclusion of our conversation, the patient is interested in moving forward with brachytherapy and use of SpaceOAR gel to reduce rectal toxicity from radiotherapy.  We will share our discussion with Dr. Retta Diones and move forward with scheduling his [facility] planning appointment in the near future.  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  The patient met briefly with Darryl Nestle in our office who will be working closely with him to coordinate OR scheduling and pre and post procedure appointments.  We will contact the pharmaceutical rep to ensure that SpaceOAR is available at the time of procedure.  We enjoyed meeting him today and look forward to continuing to participate in his care.  We personally spent 70  minutes in this encounter including chart review, reviewing radiological studies, meeting face-to-face with the patient, entering orders and completing documentation.    Marguarite Arbour, PA-C    Margaretmary Dys, MD  [facility]  Radiation Oncology Direct Dial: [phone]  Fax:  [phone] [email or web address]  Skype  [email or web address]   This document serves as a record of services personally performed by Margaretmary Dys, MD and Marcello Fennel, PA-C. It was created on their behalf by Mickie Bail, a trained medical scribe. The creation of this record is based on the scribe's personal observations and the provider's statements to them. This document has been checked and approved by the attending provider.

## 2023-11-03 NOTE — Progress Notes (Signed)
Introduced myself to the patient,and his wife, as the prostate nurse navigator.  No barriers to care identified at this time.  He is here to discuss his radiation treatment options, and will take some additional time before finalizing decision.  I gave him my business card and asked him to call me with questions or concerns.  Verbalized understanding.

## 2023-11-04 ENCOUNTER — Telehealth: Payer: Self-pay

## 2023-11-04 NOTE — Telephone Encounter (Signed)
Talbert Forest from [facility] called and states she need date and time for Spaceoar/hydrogel for Dr. Retta Diones. Talbert Forest is aware a message will be sent to the message scheduler.

## 2023-11-05 NOTE — Telephone Encounter (Signed)
I do not, it looks like Dr. Retta Diones referred him to Dr. Kathrynn Running for radiation.

## 2023-11-05 NOTE — Telephone Encounter (Signed)
Do you have a posting for this patient?

## 2023-11-09 ENCOUNTER — Telehealth: Payer: Self-pay | Admitting: *Deleted

## 2023-11-09 NOTE — Telephone Encounter (Signed)
Called patient to update, spoke with patient. 

## 2023-11-16 NOTE — Telephone Encounter (Signed)
Dr. Retta Diones- please review Dr. Kathrynn Running consult note and advise on next steps.

## 2023-11-19 ENCOUNTER — Encounter (HOSPITAL_COMMUNITY)
Admission: RE | Disposition: A | Discharge status: Home / Self Care | Payer: Self-pay | Source: Home / Self Care | Attending: Vascular Surgery

## 2023-11-19 ENCOUNTER — Ambulatory Visit (HOSPITAL_COMMUNITY)
Admission: RE | Admit: 2023-11-19 | Discharge: 2023-11-19 | Disposition: A | Discharge status: Home / Self Care | Payer: Medicare PPO | Attending: Vascular Surgery | Admitting: Vascular Surgery

## 2023-11-19 ENCOUNTER — Other Ambulatory Visit: Payer: Self-pay

## 2023-11-19 DIAGNOSIS — G4733 Obstructive sleep apnea (adult) (pediatric): Secondary | ICD-10-CM | POA: Insufficient documentation

## 2023-11-19 DIAGNOSIS — Z87891 Personal history of nicotine dependence: Secondary | ICD-10-CM | POA: Insufficient documentation

## 2023-11-19 DIAGNOSIS — Z951 Presence of aortocoronary bypass graft: Secondary | ICD-10-CM | POA: Insufficient documentation

## 2023-11-19 DIAGNOSIS — I10 Essential (primary) hypertension: Secondary | ICD-10-CM | POA: Insufficient documentation

## 2023-11-19 DIAGNOSIS — I2510 Atherosclerotic heart disease of native coronary artery without angina pectoris: Secondary | ICD-10-CM | POA: Insufficient documentation

## 2023-11-19 DIAGNOSIS — E785 Hyperlipidemia, unspecified: Secondary | ICD-10-CM | POA: Diagnosis not present

## 2023-11-19 DIAGNOSIS — I723 Aneurysm of iliac artery: Secondary | ICD-10-CM | POA: Insufficient documentation

## 2023-11-19 DIAGNOSIS — C61 Malignant neoplasm of prostate: Secondary | ICD-10-CM | POA: Insufficient documentation

## 2023-11-19 HISTORY — PX: PERIPHERAL VASCULAR INTERVENTION: CATH118257

## 2023-11-19 HISTORY — PX: EMBOLIZATION (CATH LAB): CATH118239

## 2023-11-19 HISTORY — PX: ABDOMINAL AORTOGRAM W/LOWER EXTREMITY: CATH118223

## 2023-11-19 LAB — POCT I-STAT, CHEM 8
BUN: 12 mg/dL (ref 8–23)
Calcium, Ion: 1.28 mmol/L (ref 1.15–1.40)
Chloride: 106 mmol/L (ref 98–111)
Creatinine, Ser: 1 mg/dL (ref 0.61–1.24)
Glucose, Bld: 114 mg/dL — ABNORMAL HIGH (ref 70–99)
HCT: 38 % — ABNORMAL LOW (ref 39.0–52.0)
Hemoglobin: 12.9 g/dL — ABNORMAL LOW (ref 13.0–17.0)
Potassium: 3.9 mmol/L (ref 3.5–5.1)
Sodium: 139 mmol/L (ref 135–145)
TCO2: 21 mmol/L — ABNORMAL LOW (ref 22–32)

## 2023-11-19 SURGERY — ABDOMINAL AORTOGRAM W/LOWER EXTREMITY
Anesthesia: LOCAL

## 2023-11-19 MED ORDER — SODIUM CHLORIDE 0.9% FLUSH: 3.0000 mL | Freq: Two times a day (BID) | INTRAVENOUS | Status: DC

## 2023-11-19 MED ORDER — LABETALOL HCL 5 MG/ML IV SOLN: 10.0000 mg | INTRAVENOUS | Status: DC | PRN

## 2023-11-19 MED ORDER — LIDOCAINE HCL (PF) 1 % IJ SOLN
INTRAMUSCULAR | Status: AC
  Filled 2023-11-19: qty 30

## 2023-11-19 MED ORDER — CLOPIDOGREL BISULFATE 300 MG PO TABS
ORAL_TABLET | ORAL | Status: DC | PRN
  Administered 2023-11-19: 300 mg via ORAL

## 2023-11-19 MED ORDER — ACETAMINOPHEN 325 MG PO TABS: 650.0000 mg | ORAL_TABLET | ORAL | Status: DC | PRN

## 2023-11-19 MED ORDER — CLOPIDOGREL BISULFATE 75 MG PO TABS: 75.0000 mg | ORAL_TABLET | Freq: Every day | ORAL | 11 refills | Status: DC

## 2023-11-19 MED ORDER — SODIUM CHLORIDE 0.9 % IV SOLN: INTRAVENOUS | Status: DC

## 2023-11-19 MED ORDER — CLOPIDOGREL BISULFATE 75 MG PO TABS: 300.0000 mg | ORAL_TABLET | Freq: Once | ORAL | Status: DC

## 2023-11-19 MED ORDER — FENTANYL CITRATE (PF) 100 MCG/2ML IJ SOLN
INTRAMUSCULAR | Status: DC | PRN
  Administered 2023-11-19 (×3): 25 ug via INTRAVENOUS

## 2023-11-19 MED ORDER — ASPIRIN 81 MG PO TBEC: 81.0000 mg | DELAYED_RELEASE_TABLET | Freq: Every day | ORAL | Status: DC

## 2023-11-19 MED ORDER — HEPARIN (PORCINE) IN NACL 1000-0.9 UT/500ML-% IV SOLN
INTRAVENOUS | Status: DC | PRN
  Administered 2023-11-19 (×2): 500 mL

## 2023-11-19 MED ORDER — ASPIRIN 81 MG PO CHEW
CHEWABLE_TABLET | ORAL | Status: DC | PRN
  Administered 2023-11-19: 81 mg via ORAL

## 2023-11-19 MED ORDER — HEPARIN SODIUM (PORCINE) 1000 UNIT/ML IJ SOLN
INTRAMUSCULAR | Status: AC
  Filled 2023-11-19: qty 10

## 2023-11-19 MED ORDER — LIDOCAINE HCL (PF) 1 % IJ SOLN
INTRAMUSCULAR | Status: DC | PRN
  Administered 2023-11-19: 15 mL via INTRADERMAL

## 2023-11-19 MED ORDER — SODIUM CHLORIDE 0.9 % IV SOLN: 250.0000 mL | INTRAVENOUS | Status: DC | PRN

## 2023-11-19 MED ORDER — FENTANYL CITRATE (PF) 100 MCG/2ML IJ SOLN
INTRAMUSCULAR | Status: AC
  Filled 2023-11-19: qty 2

## 2023-11-19 MED ORDER — SODIUM CHLORIDE 0.9% FLUSH: 3.0000 mL | INTRAVENOUS | Status: DC | PRN

## 2023-11-19 MED ORDER — ONDANSETRON HCL 4 MG/2ML IJ SOLN
4.0000 mg | Freq: Four times a day (QID) | INTRAMUSCULAR | Status: DC | PRN
Start: 2023-11-19 — End: 2023-11-19

## 2023-11-19 MED ORDER — HYDRALAZINE HCL 20 MG/ML IJ SOLN: 5.0000 mg | INTRAMUSCULAR | Status: DC | PRN

## 2023-11-19 MED ORDER — CLOPIDOGREL BISULFATE 75 MG PO TABS: 75.0000 mg | ORAL_TABLET | Freq: Every day | ORAL | Status: DC

## 2023-11-19 MED ORDER — MIDAZOLAM HCL 2 MG/2ML IJ SOLN
INTRAMUSCULAR | Status: DC | PRN
  Administered 2023-11-19 (×2): 1 mg via INTRAVENOUS

## 2023-11-19 MED ORDER — HEPARIN SODIUM (PORCINE) 1000 UNIT/ML IJ SOLN
INTRAMUSCULAR | Status: DC | PRN
  Administered 2023-11-19: 9000 [IU] via INTRAVENOUS

## 2023-11-19 MED ORDER — IODIXANOL 320 MG/ML IV SOLN
INTRAVENOUS | Status: DC | PRN
  Administered 2023-11-19: 150 mL

## 2023-11-19 MED ORDER — MIDAZOLAM HCL 2 MG/2ML IJ SOLN
INTRAMUSCULAR | Status: AC
  Filled 2023-11-19: qty 2

## 2023-11-19 MED ORDER — ASPIRIN 81 MG PO CHEW
CHEWABLE_TABLET | ORAL | Status: AC
  Filled 2023-11-19: qty 1

## 2023-11-19 MED ORDER — CLOPIDOGREL BISULFATE 300 MG PO TABS
ORAL_TABLET | ORAL | Status: AC
  Filled 2023-11-19: qty 1

## 2023-11-19 SURGICAL SUPPLY — 23 items
CATH MUSTANG 12X20X135 (BALLOONS) IMPLANT
CATH OMNI FLUSH 5F 65CM (CATHETERS) IMPLANT
CATH QUICKCROSS SUPP .035X90CM (MICROCATHETER) IMPLANT
CATH SLIP KMP 65CM 5FR (CATHETERS) IMPLANT
CLOSURE PERCLOSE PROSTYLE (VASCULAR PRODUCTS) IMPLANT
COIL IDC 2D .035 6MMX20CM (Embolic) IMPLANT
COIL IDC 2D .035 8MMX20CM (Embolic) IMPLANT
COVER DOME SNAP 22 D (MISCELLANEOUS) IMPLANT
DEVICE TORQUE .025-.038 (MISCELLANEOUS) IMPLANT
GLIDEWIRE ADV .035X260CM (WIRE) IMPLANT
GUIDEWIRE ANGLED .035X260CM (WIRE) IMPLANT
KIT ENCORE 26 ADVANTAGE (KITS) IMPLANT
KIT MICROPUNCTURE NIT STIFF (SHEATH) IMPLANT
KIT SYRINGE INJ CVI SPIKEX1 (MISCELLANEOUS) IMPLANT
SET ATX-X65L (MISCELLANEOUS) IMPLANT
SHEATH CATAPULT 8F 45 ST (SHEATH) IMPLANT
SHEATH PINNACLE 5F 10CM (SHEATH) IMPLANT
SHEATH PINNACLE 8F 10CM (SHEATH) IMPLANT
SHEATH PROBE COVER 6X72 (BAG) IMPLANT
STENT VIABAHN VBX 11X59X80 (Permanent Stent) IMPLANT
TRANSDUCER W/STOPCOCK (MISCELLANEOUS) ×2 IMPLANT
TRAY PV CATH (CUSTOM PROCEDURE TRAY) ×2 IMPLANT
WIRE BENTSON .035X145CM (WIRE) IMPLANT

## 2023-11-19 NOTE — H&P (Signed)
 History and Physical Interval Note:  11/19/2023 7:28 AM  Mark [name]  has presented today for surgery, with the diagnosis of right hypogastric aneurysm.  The various methods of treatment have been discussed with the patient and family. After consideration of risks, benefits and other options for treatment, the patient has consented to  Procedure(s): ABDOMINAL AORTOGRAM W/LOWER EXTREMITY (N/A) as a surgical intervention.  The patient's history has been reviewed, patient examined, no change in status, stable for surgery.  I have reviewed the patient's chart and labs.  Questions were answered to the patient's satisfaction.    Treat right hypogastric aneurysm with coiling and possible stent  Mark [name]     Patient name: Mark [name]        MRN: [id number]        DOB: Apr 08, 1951        Sex: male   REASON FOR CONSULT: Right internal iliac aneurysm   HPI: Mark [name] is a 73 y.o. 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  Patient states no prior knowledge of the aneurysm.  This was discovered on CT scan from 10/05/2023 for workup of his prostate cancer.  States he has a little bit of upper epigastric discomfort in the morning that gets better with Pepto-Bismol.  No other abdominal pain.  No history of pelvic radiation.  States he is seeing his urologist today to discuss treatment of his prostate cancer.        Past Medical History:  Diagnosis Date   Abnormal nuclear stress test false positive 05/2018   BPH (benign prostatic hyperplasia) 04/21/2019   Chest pain stable CAD with cath 06/07/18 possible GI 06/03/2018   Coronary artery disease      a. s/p CABG 2005. b. cath 05/2018 - patent LIMA-LAD, moderate LM and prox OM disease with neg FFR.   Dyslipidemia     Esophageal stricture     ESOPHAGEAL STRICTURE 12/18/2008     Qualifier: History of  By: Marcelo CMA (AAMA), Dottie     ESOPHAGITIS 12/18/2008    Qualifier: History of  By: Marcelo CMA (AAMA), Dottie     Essential hypertension, benign 04/06/2013   Gastrointestinal bleeding     GERD (gastroesophageal reflux disease)     History of colonic polyps 08/26/2014    [facility] [place]  during colonoscopy in May of 2015. They recommend repeat colonoscopy May of 2017 do to inadequate prep, pathology showed hyperplastic polyp    Hyperlipidemia     Hypothyroidism     Insomnia 04/21/2019   Obstructive sleep apnea 07/11/2013    Has mixed apnea, CPAP setting 7    OSA (obstructive sleep apnea)     RECTAL BLEEDING 12/18/2008    Qualifier: Diagnosis of  By: Marcelo CMA (AAMA), Dottie     Urinary retention     Vitiligo 07/11/2013               Past Surgical History:  Procedure Laterality Date   ANTERIOR CERVICAL DECOMP/DISCECTOMY FUSION N/A 10/29/2022    Procedure: CERVICAL FIVE-SIX ANTERIOR CERVICAL DECOMPRESSION/DISCECTOMY FUSION;  Surgeon: Onetha Kuba, MD;  Location: [facility] OR;  Service: Neurosurgery;  Laterality: N/A;  3C   APPENDECTOMY       bilateral inguinal hernia repair       CARDIAC CATHETERIZATION   12/2005, 06/2005   CHOLECYSTECTOMY N/A 08/04/2022    Procedure: LAPAROSCOPIC  CHOLECYSTECTOMY;  Surgeon: Evonnie Dorothyann LABOR, DO;  Location: [facility] ORS;  Service: General;  Laterality: N/A;   COLONOSCOPY       CORONARY ARTERY BYPASS GRAFT   2006    LIMA to LAD, off pump   CORONARY PRESSURE/FFR STUDY N/A 06/07/2018    Procedure: INTRAVASCULAR PRESSURE WIRE/FFR STUDY;  Surgeon: Anner Alm ORN, MD;  Location: [facility] INVASIVE CV LAB;  Service: Cardiovascular;  Laterality: N/A;   LEFT HEART CATH AND CORS/GRAFTS ANGIOGRAPHY N/A 06/07/2018    Procedure: LEFT HEART CATH AND CORS/GRAFTS ANGIOGRAPHY;  Surgeon: Anner Alm ORN, MD;  Location: [facility] INVASIVE CV LAB;  Service: Cardiovascular;  Laterality: N/A;   OPEN REDUCTION  INTERNAL FIXATION (ORIF) HAND Left 07/21/2015    Procedure: IRRIGATION AND DEBRIDMENT LEFT INDEX AND MIDDLE FINGER, OPEN REDUCTION INTERNAL FIXATION LEFT INDEX FINGER.;  Surgeon: Elsie Mussel, MD;  Location: [facility] OR;  Service: Orthopedics;  Laterality: Left;               Family History  Problem Relation Age of Onset   Heart disease Father     Hypertension Father            SOCIAL HISTORY: Social History         Socioeconomic History   Marital status: Married      Spouse name: Not on file   Number of children: Not on file   Years of education: Not on file   Highest education level: 12th grade  Occupational History   Occupation: Probation Officer: [employer]  Tobacco Use   Smoking status: Former      Current packs/day: 0.00      Average packs/day: 2.0 packs/day for 20.0 years (40.0 ttl pk-yrs)      Types: Cigarettes      Start date: 07/31/1959      Quit date: 07/31/1979      Years since quitting: 44.2   Smokeless tobacco: Former      Types: Chew      Quit date: 07/22/1995  Vaping Use   Vaping status: Never Used  Substance and Sexual Activity   Alcohol use: Yes      Comment: occassional   Drug use: No   Sexual activity: Yes  Other Topics Concern   Not on file  Social History Narrative   Not on file    Social Determinants of Health        Financial Resource Strain: Low Risk  (10/07/2023)    Overall Financial Resource Strain (CARDIA)     Difficulty of Paying Living Expenses: Not very hard  Food Insecurity: No Food Insecurity (10/07/2023)    Hunger Vital Sign     Worried About Running Out of Food in the Last Year: Never true     Ran Out of Food in the Last Year: Never true  Transportation Needs: No Transportation Needs (10/07/2023)    PRAPARE - Therapist, Art (Medical): No     Lack of Transportation (Non-Medical): No  Physical Activity: Insufficiently Active (10/07/2023)    Exercise Vital Sign     Days of Exercise per Week:  3 days     Minutes of Exercise per Session: 30 min  Stress: No Stress Concern Present (10/07/2023)    [employer] of Occupational Health - Occupational Stress Questionnaire     Feeling of Stress : Only a little  Social Connections: Unknown (10/07/2023)    Social Connection and Isolation Panel [NHANES]  Frequency of Communication with Friends and Family: Three times a week     Frequency of Social Gatherings with Friends and Family: More than three times a week     Attends Religious Services: 1 to 4 times per year     Active Member of [employer] or Organizations: Patient declined     Attends Engineer, Structural: Not on file     Marital Status: Married  Catering Manager Violence: Not on file      Allergies      Allergies  Allergen Reactions   Mobic [Meloxicam] Rash              Current Outpatient Medications  Medication Sig Dispense Refill   Acetaminophen  (TYLENOL  8 HOUR ARTHRITIS PAIN PO) Take by mouth.       albuterol  (VENTOLIN  HFA) 108 (90 Base) MCG/ACT inhaler TAKE 2 PUFFS BY MOUTH EVERY 6 HOURS AS NEEDED FOR WHEEZE OR SHORTNESS OF BREATH 18 each 6   ALPRAZolam  (XANAX ) 0.25 MG tablet Take 1 tablet (0.25 mg total) by mouth at bedtime as needed. 30 tablet 2   APPLE CIDER VINEGAR PO Take 1 each by mouth daily. Gummies       aspirin  81 MG tablet Take 1 tablet (81 mg total) by mouth every morning. 30 tablet     augmented betamethasone dipropionate (DIPROLENE-AF) 0.05 % cream Apply 1 Application topically 2 (two) times daily as needed (irritation).       cetirizine (ZYRTEC) 10 MG tablet Take 5 mg by mouth daily as needed for allergies.       isosorbide  mononitrate (IMDUR ) 30 MG 24 hr tablet TAKE 1 TABLET BY MOUTH EVERY DAY 90 tablet 2   levothyroxine  (SYNTHROID ) 100 MCG tablet Take 1 tablet (100 mcg total) by mouth daily. 90 tablet 1   losartan  (COZAAR ) 50 MG tablet TAKE 1 TABLET BY MOUTH EVERY DAY 90 tablet 1   metoprolol  succinate (TOPROL -XL) 25 MG 24 hr tablet Take  0.5 tablets (12.5 mg total) by mouth daily. 45 tablet 1   naproxen  (NAPROSYN ) 500 MG tablet Take 1 tablet (500 mg total) by mouth daily.       nitroGLYCERIN  (NITROSTAT ) 0.4 MG SL tablet Place 0.4 mg under the tongue every 5 (five) minutes as needed for chest pain.       NON FORMULARY Pt uses a cpap nightly       omeprazole  (PRILOSEC) 20 MG capsule Take 1 capsule (20 mg total) by mouth daily. 90 capsule 1   rosuvastatin  (CRESTOR ) 40 MG tablet TAKE 1 TABLET BY MOUTH EVERY DAY 90 tablet 1   Saw Palmetto , Serenoa repens, (SAW PALMETTO  PO) Take 1 capsule by mouth daily.       tamsulosin  (FLOMAX ) 0.4 MG CAPS capsule Take 1 capsule (0.4 mg total) by mouth daily. 90 capsule 1   triamcinolone  cream (KENALOG ) 0.1 % Apply 1 application topically 2 (two) times daily. 45 g 4      No current facility-administered medications for this visit.        REVIEW OF SYSTEMS:  [X]  denotes positive finding, [ ]  denotes negative finding Cardiac   Comments:  Chest pain or chest pressure:      Shortness of breath upon exertion:      Short of breath when lying flat:      Irregular heart rhythm:             Vascular      Pain in calf, thigh, or hip brought on by  ambulation:      Pain in feet at night that wakes you up from your sleep:       Blood clot in your veins:      Leg swelling:              Pulmonary      Oxygen at home:      Productive cough:       Wheezing:              Neurologic      Sudden weakness in arms or legs:       Sudden numbness in arms or legs:       Sudden onset of difficulty speaking or slurred speech:      Temporary loss of vision in one eye:       Problems with dizziness:              Gastrointestinal      Blood in stool:       Vomited blood:              Genitourinary      Burning when urinating:       Blood in urine:             Psychiatric      Major depression:              Hematologic      Bleeding problems:      Problems with blood clotting too easily:              Skin      Rashes or ulcers:             Constitutional      Fever or chills:          PHYSICAL EXAM:    Vitals:    10/27/23 1008  BP: 124/81  Pulse: 67  Temp: 97.9 F (36.6 C)  TempSrc: Temporal  SpO2: 92%  Weight: 201 lb 3.2 oz (91.3 kg)  Height: 5' 11 (1.803 m)      GENERAL: The patient is a well-nourished male, in no acute distress. The vital signs are documented above. CARDIAC: There is a regular rate and rhythm.  VASCULAR:  Bilateral femoral pulses palpable Bilateral DP pulses palpable PULMONARY: No respiratory distress ABDOMEN: Soft and non-tender. MUSCULOSKELETAL: There are no major deformities or cyanosis. NEUROLOGIC: No focal weakness or paresthesias are detected. SKIN: There are no ulcers or rashes noted. PSYCHIATRIC: The patient has a normal affect.   DATA:    CT abdomen/pelvis 10/05/23:   IMPRESSION: 1. Enhancing nodular focus along the right posterior aspect of the prostate gland measuring 11 mm, compatible with known prostate cancer. 2. No convincing evidence of metastatic disease in the abdomen or pelvis. 3. 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 Suggest vascular surgery consultation. 4. Subcentimeter hypodensity in the right lobe of the liver measuring 6 mm, nonspecific but favored to reflect a benign lesion such as a cyst or hemangioma. 5. Nonobstructive bilateral renal stones measure up to 7 mm. 6.  Aortic Atherosclerosis (ICD10-I70.0).     Electronically Signed   By: Reyes Holder M.D.   On: 10/10/2023 08:35   Assessment/Plan:   73 y.o. 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  I reviewed his CT scan with the patient and discussed he  has a 3.5 cm right internal iliac artery aneurysm.  I discussed I would recommend surgical  intervention.  I discussed guidelines are to repair these at greater than 3 to 3.5 cm given risk of rupture.  I think we can treat this with coil embolization in the Cath Lab from a transfemoral approach.  I discussed risk and benefits including risk of vessel injury, pelvic ischemia etc.  He does have a patent left hypogastric so I think his risk pelvic ischemia is low.  Will get scheduled his convenience.  Discussed this being done under moderate sedation.      Mark DOROTHA Gaskins, MD [facility] Office: [phone]

## 2023-11-19 NOTE — Op Note (Signed)
 Patient name: Mark [name] MRN: [id number] DOB: [birth year] Sex: male  11/19/2023 Pre-operative Diagnosis: 3.5 cm right internal iliac artery aneurysm Post-operative diagnosis:  Same Surgeon:  Lonni DOROTHA Gaskins, MD Procedure Performed: 1.  Ultrasound-guided access left common femoral artery 2.  Aortogram with catheter selection of aorta 3.  Catheter selection of right internal iliac artery 4.  Coil embolization of right internal iliac artery aneurysm (8 mm x 20 cm, 8 mm x 20 cm, 6 mm x 20 cm interlock 0.035 coils in one outflow branch of aneurysm and 6 mm x 20 cm interlock coil in second outflow branch of aneurysm) 5.  Angioplasty and stent of right common and external iliac artery for coverage of the internal iliac artery (11 mm x 59 mm VBX) 6.  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  He presents today for coil embolizationand possible stenting after risk benefits discussed.  Findings:   Aortogram showed patent renal arteries bilaterally with a patent infrarenal aorta.  Both iliac arteries were widely patent.  The right internal iliac artery aneurysm was identified.    There were 2 main outflow branches of the right internal iliac artery aneurysm.  The right internal iliac artery was then selected and the initial larger outflow blanch of the aneurysm was coiled with a 8 mm x 20 cm, 8 mm x 20 cm and 6 mm x 20 cm interlock coils.  The second outflow branch was covered treated with a 6 mm x 20 cm interlock coil.  There appeared to be no further outflow of the aneurysm.  I then stented across the common and external iliac artery to cover the hypogastric with a 11 mm x 59 mm VBX (to prevent inflow into the aneurysm).  I did have to postdilated up to 12 mm proximally in the stent in order to get seal.  No filling of the aneurysm at completion.   Procedure:  The patient was  identified in the holding area and taken to room 8.  The patient was then placed supine on the table and prepped and draped in the usual sterile fashion.  A time out was called.  The patient received Versed  and fentanyl  for conscious moderate sedation.  Vital signs were monitored including heart rate, respiratory rate, oxygenation and blood pressure.  I was present for all of moderate sedation.  Ultrasound was used to evaluate the left common femoral artery.  It was patent .  A digital ultrasound image was acquired.  A micropuncture needle was used to access the left common femoral artery under ultrasound guidance.  An 018 wire was advanced without resistance and a micropuncture sheath was placed.  The 018 wire was removed and a benson wire was placed.  I then dilated this with a 8 French dilator and placed a Perclose device in the left common femoral artery.  Then placed a short 8 French sheath in the left common femoral artery.  Patient was given 100 units/kg IV heparin .  I went ahead and put a Omni Flush catheter in the infrarenal aorta and abdominal aortogram was obtained.  I then used a Omni Flush catheter with a Glidewire to select the right hypogastric artery.  I then went ahead and advanced the 8 French catapult sheath in the left groin into the right hypogastric aneurysm.  Initially selected the large outflow branch of the right hypogastric aneurysm and this was treated  with three coils using 8 mm x 20 cm, 8 mm x 20 cm and 6 mm x 20 cm interlock coils and a quick cross catheter.  We then were able to get into the second smaller outflow branch of the aneurysm and this was treated with one 6 mm x 20 cm interlock coil using a quick cross catheter.  We had completely treated the outflow of the aneurysm.  I then repositioned my wire down the external iliac into the SFA and then treated the right common and external iliac artery with a 11 mm x 59 mm VBX to cover the hypogastric and prevent inflow into the  aneurysm.  There was no seal proximally so I had to upsize to a larger balloon in the VBX with a 12 mm Mustang.  No filling of the aneurysm.  Widely patent stent.  Good flow distally.  Wires and catheters were removed.  We tied down the Perclose device in the left groin with good hemostasis.  Plan: Excellent results after coiling of right hypogastric aneurysm and stenting for coverage of the inflow.  Will start Plavix  in addition to his aspirin  statin.  Follow-up in 1 month.     Lonni DOROTHA Gaskins, MD [facility] [place] Office: [phone]

## 2023-11-19 NOTE — Discharge Instructions (Signed)
 Femoral Site Care This sheet gives you information about how to care for yourself after your procedure. Your health care provider may also give you more specific instructions. If you have problems or questions, contact your health care provider. What can I expect after the procedure?  After the procedure, it is common to have: Bruising that usually fades within 1-2 weeks. Tenderness at the site. Follow these instructions at home: Wound care Follow instructions from your health care provider about how to take care of your insertion site. Make sure you: Wash your hands with soap and water  before you change your bandage (dressing). If soap and water  are not available, use hand sanitizer. Remove your dressing TOMORRROW AT 11:OO AM Do not take baths, swim, or use a hot tub until your health care provider approves. You may shower AFTER REMOVAL OF DRESSING ALLOW WATER  TO RUN OVER THE SITE Pat the area dry with a clean towel. Do not rub the site. This may cause bleeding. Do not apply powder or lotion to the site. Keep the site clean and dry. Check your femoral site every day for signs of infection. Check for: Redness, swelling, or pain. Fluid or blood. Warmth. Pus or a bad smell. Activity For the first 2-3 days after your procedure, or as long as directed: Avoid climbing stairs as much as possible. Do not squat. Do not lift anything that is heavier than 10 lb (4.5 kg), or the limit that you are told, until your health care provider says that it is safe. For 5 days Rest as directed. Avoid sitting for a long time without moving. Get up to take short walks every 1-2 hours. Do not drive for 24 hours if you were given a medicine to help you relax (sedative). General instructions Take over-the-counter and prescription medicines only as told by your health care provider. Keep all follow-up visits as told by your health care provider. This is important. Contact a health care provider if you  have: A fever or chills. You have redness, swelling, or pain around your insertion site. Get help right away if: The catheter insertion area swells very fast. Lay flat, apply pressure to the site for 15 minutes. CALL 911 You pass out. You suddenly start to sweat or your skin gets clammy. The catheter insertion area is bleeding,lay flat,and  the bleeding does not stop when you hold steady pressure on the area for 15 minutes The area near or just beyond the catheter insertion site becomes pale, cool, tingly, or numb. These symptoms may represent a serious problem that is an emergency. Do not wait to see if the symptoms will go away. Get medical help right away. Call your local emergency services (911 in the [place] Do not drive yourself to the hospital. Summary After the procedure, it is common to have bruising that usually fades within 1-2 weeks. Check your femoral site every day for signs of infection. Do not lift anything that is heavier than 10 lb (4.5 kg), or the limit that you are told, until your health care provider says that it is safe. This information is not intended to replace advice given to you by your health care provider. Make sure you discuss any questions you have with your health care provider. Document Revised: 11/16/2017 Document Reviewed: 11/16/2017 Elsevier Patient Education  2020 [employer].

## 2023-11-20 ENCOUNTER — Encounter (HOSPITAL_COMMUNITY): Payer: Self-pay | Admitting: Vascular Surgery

## 2023-11-22 ENCOUNTER — Encounter: Payer: Self-pay | Admitting: Family Medicine

## 2023-11-24 ENCOUNTER — Telehealth: Payer: Self-pay

## 2023-11-24 NOTE — Telephone Encounter (Signed)
 MD office called to schedule appt for surgery with dahsledt

## 2023-11-24 NOTE — Telephone Encounter (Signed)
 Please see below.

## 2023-11-24 NOTE — Telephone Encounter (Signed)
 Patient called and left voice message on the nurse line stating he had some questions for Dr. [name] and wanted to discuss some Issues with him. Reviewed pt's chart, returned call for clarification, two identifiers used. Spoke with patient, he stated that he is still having some tenderness in his right hip and his right leg feels tired. Patient had recent procedure on 11/18/22 with Dr. [name]. I explained to the patient that it does take time for him to heal and that it is normal to still have some discomfort 5 days post op. He stated that he did feel better since surgery but wanted to make sure that it was normal to still have the tenderness in his hip area. He denied any swelling of his right lower extremity, no discoloration or temperature variation in the leg either.  I told the patient to continue taking his medications as prescribed as he did have a couple questions on the blood thinner as well. I reviewed with him proper elevation also and when his post op was. I told him that if anything should change he should call the office back. He was appreciative of the return call

## 2023-11-25 ENCOUNTER — Telehealth: Payer: Self-pay | Admitting: *Deleted

## 2023-11-25 ENCOUNTER — Other Ambulatory Visit: Payer: Self-pay

## 2023-11-25 DIAGNOSIS — I723 Aneurysm of iliac artery: Secondary | ICD-10-CM

## 2023-11-25 NOTE — Telephone Encounter (Signed)
CALLED PATIENT TO UPDATE, LVM FOR A RETURN CALL 

## 2023-11-26 ENCOUNTER — Other Ambulatory Visit: Payer: Self-pay

## 2023-11-26 ENCOUNTER — Other Ambulatory Visit: Payer: Self-pay | Admitting: Family Medicine

## 2023-11-26 DIAGNOSIS — C61 Malignant neoplasm of prostate: Secondary | ICD-10-CM

## 2023-11-26 NOTE — Telephone Encounter (Signed)
 Added to surgery workque and surgery scheduled with pt and Dr. Kathrynn Running.

## 2023-11-27 ENCOUNTER — Telehealth: Payer: Self-pay | Admitting: *Deleted

## 2023-11-27 NOTE — Telephone Encounter (Signed)
 CALLED PATIENT TO INFORM OF PRE-SEED APPTS. ON 12-03-23- ARRIVAL TIME- 10:15 AM @ [facility] AND HIS PROCEDURE ON 12-24-23 @ 1:30 PM, SPOKE WITH PATIENT AND HE IS AWARE OF THESE APPTS. AND THE PROCEDURE

## 2023-12-01 ENCOUNTER — Telehealth: Payer: Self-pay | Admitting: *Deleted

## 2023-12-01 NOTE — Telephone Encounter (Signed)
 CALLED PATIENT TO REMIND OF PRE-SEED APPTS. FOR 12-03-23- ARRIVAL TIME- 10:15 AM @ [facility], SPOKE WITH PATIENT AND HE IS AWARE OF THESE APPTS.

## 2023-12-03 ENCOUNTER — Ambulatory Visit
Admission: RE | Admit: 2023-12-03 | Discharge: 2023-12-03 | Disposition: A | Discharge status: Home / Self Care | Payer: Medicare PPO | Source: Ambulatory Visit | Attending: Urology | Admitting: Urology

## 2023-12-03 ENCOUNTER — Ambulatory Visit
Admission: RE | Admit: 2023-12-03 | Discharge: 2023-12-03 | Disposition: A | Discharge status: Home / Self Care | Payer: Medicare PPO | Source: Ambulatory Visit | Attending: Radiation Oncology | Admitting: Radiation Oncology

## 2023-12-03 ENCOUNTER — Encounter: Payer: Self-pay | Admitting: Urology

## 2023-12-03 VITALS — Ht 71.0 in | Wt 198.0 lb

## 2023-12-03 DIAGNOSIS — Z191 Hormone sensitive malignancy status: Secondary | ICD-10-CM | POA: Diagnosis not present

## 2023-12-03 DIAGNOSIS — C61 Malignant neoplasm of prostate: Secondary | ICD-10-CM | POA: Insufficient documentation

## 2023-12-03 NOTE — Progress Notes (Signed)
  Radiation Oncology         [phone]) [phone] ________________________________  Name: Mark [name] MRN: [id number]  Date: 12/03/2023  DOB: 09-21-1951  SIMULATION AND TREATMENT PLANNING NOTE PUBIC ARCH STUDY  WJ:XBJYNW, Jonna Coup, MD  Marcine Matar, MD  DIAGNOSIS:  73 y.o. gentleman with Stage T1c adenocarcinoma of the prostate with Gleason score of 4+3, and PSA of 5   Oncology History  Malignant neoplasm of prostate (HCC)  09/29/2023 Cancer Staging   Staging form: Prostate, AJCC 8th Edition - Clinical stage from 09/29/2023: Stage IIC (cT1c, cN0, cM0, PSA: 5, Grade Group: 3) - Signed by Marcello Fennel, PA-C on 11/03/2023 Histopathologic type: Adenocarcinoma, NOS Stage prefix: Initial diagnosis Prostate specific antigen (PSA) range: Less than 10 Gleason primary pattern: 4 Gleason secondary pattern: 3 Gleason score: 7 Histologic grading system: 5 grade system Number of biopsy cores examined: 12 Number of biopsy cores positive: 5 Location of positive needle core biopsies: Both sides   11/03/2023 Initial Diagnosis   Malignant neoplasm of prostate (HCC)       ICD-10-CM   1. Malignant neoplasm of prostate (HCC)  C61       COMPLEX SIMULATION:  The patient presented today for evaluation for possible prostate seed implant. He was brought to the radiation planning suite and placed supine on the CT couch. A 3-dimensional image study set was obtained in upload to the planning computer. There, on each axial slice, I contoured the prostate gland. Then, using three-dimensional radiation planning tools I reconstructed the prostate in view of the structures from the transperineal needle pathway to assess for possible pubic arch interference. In doing so, I did not appreciate any pubic arch interference. Also, the patient's prostate volume was estimated based on the drawn structure. The volume was 25 cc.  Given the pubic arch appearance and prostate volume, patient remains a good candidate  to proceed with prostate seed implant. Today, he freely provided informed written consent to proceed.    PLAN: The patient will undergo prostate seed implant.   ________________________________  Artist Pais. Kathrynn Running, M.D.

## 2023-12-03 NOTE — Progress Notes (Signed)
Radiation Oncology         [phone]) [phone] ________________________________  Outpatient Follow up- Pre-seed visit  Name: Mark [name] MRN: [id number]  Date: 12/03/2023  DOB: [birth year]  WJ:XBJYNW, Jonna Coup, MD  Marcine Matar, MD   REFERRING PHYSICIAN: Marcine Matar, MD  DIAGNOSIS: 73 y.o. gentleman with Stage T1c adenocarcinoma of the prostate with Gleason score of 4+3, and PSA of 5.     ICD-10-CM   1. Malignant neoplasm of prostate (HCC)  C61       HISTORY OF PRESENT ILLNESS: Mark [name] is a 73 y.o. male with a diagnosis of prostate cancer. He was noted to have an elevated PSA of 4.6 on routine labs in 05/2023, by his primary care physician, Dr. Gerda Diss.  The PSA was repeated just a few days later and remained elevated at 5.0.  Accordingly, he was referred for evaluation in urology by Dr. Retta Diones on 08/04/23,  digital rectal examination performed at that time showed no nodules or induration. A repeat PSA obtained that day showed stable elevation at 5. The patient proceeded to transrectal ultrasound with 12 biopsies of the prostate on 09/29/23.  The prostate volume measured 25 cc.  Out of 12 core biopsies, 5 were positive.  The maximum Gleason score was 4+3, and this was seen in the right base, right mid, right base lateral (with perineural invasion), and right mid lateral. Additionally, a small focus of Gleason 3+4 was seen in the left apex.   A CT A/P was performed on 10/01/2023 for disease staging and was without any evidence of metastatic disease.  The patient reviewed the biopsy results with his urologist and was kindly referred to [place] for discussion of potential radiation treatment options. We initially met the patient on 11/03/23 and he was most interested in proceeding with brachytherapy and SpaceOAR gel placement for treatment of his disease. He is here today for his pre-procedure imaging for planning and to answer any additional questions he may have about this  treatment.   PREVIOUS RADIATION THERAPY: No  PAST MEDICAL HISTORY:  Past Medical History:  Diagnosis Date   Abnormal nuclear stress test false positive 05/2018   BPH (benign prostatic hyperplasia) 04/21/2019   Chest pain stable CAD with cath 06/07/18 possible GI 06/03/2018   Coronary artery disease    a. s/p CABG 2005. b. cath 05/2018 - patent LIMA-LAD, moderate LM and prox OM disease with neg FFR.   Dyslipidemia    Elevated PSA    Esophageal stricture    ESOPHAGEAL STRICTURE 12/18/2008   Qualifier: History of  By: Candice Camp CMA (AAMA), Dottie     ESOPHAGITIS 12/18/2008   Qualifier: History of  By: Candice Camp CMA (AAMA), Dottie     Essential hypertension, benign 04/06/2013   Gastrointestinal bleeding    GERD (gastroesophageal reflux disease)    History of colonic polyps 08/26/2014   [facility] [place] during colonoscopy in May of 2015. They recommend repeat colonoscopy May of 2017 do to inadequate prep, pathology showed hyperplastic polyp    Hyperlipidemia    Hypothyroidism    Insomnia 04/21/2019   Obstructive sleep apnea 07/11/2013   Has mixed apnea, CPAP setting 7    OSA (obstructive sleep apnea)    RECTAL BLEEDING 12/18/2008   Qualifier: Diagnosis of  By: Candice Camp CMA (AAMA), Dottie     Urinary retention    Vitiligo 07/11/2013      PAST SURGICAL HISTORY: Past Surgical History:  Procedure Laterality Date   ABDOMINAL  AORTOGRAM W/LOWER EXTREMITY N/A 11/19/2023   Procedure: ABDOMINAL AORTOGRAM W/LOWER EXTREMITY;  Surgeon: Cephus Shelling, MD;  Location: [facility] INVASIVE CV LAB;  Service: Cardiovascular;  Laterality: N/A;   ANTERIOR CERVICAL DECOMP/DISCECTOMY FUSION N/A 10/29/2022   Procedure: CERVICAL FIVE-SIX ANTERIOR CERVICAL DECOMPRESSION/DISCECTOMY FUSION;  Surgeon: Donalee Citrin, MD;  Location: [facility] OR;  Service: Neurosurgery;  Laterality: N/A;  3C   APPENDECTOMY     bilateral inguinal hernia repair     CARDIAC CATHETERIZATION  12/2005,  06/2005   CHOLECYSTECTOMY N/A 08/04/2022   Procedure: LAPAROSCOPIC CHOLECYSTECTOMY;  Surgeon: Lewie Chamber, DO;  Location: [facility] ORS;  Service: General;  Laterality: N/A;   COLONOSCOPY     CORONARY ARTERY BYPASS GRAFT  11/17/2004   LIMA to LAD, off pump   CORONARY PRESSURE/FFR STUDY N/A 06/07/2018   Procedure: INTRAVASCULAR PRESSURE WIRE/FFR STUDY;  Surgeon: Marykay Lex, MD;  Location[facility] INVAS[facility] CV LAB;  Service: Cardiovascular;  Laterality: N/A;   EMBOLIZATION (CATH LAB)  11/19/2023   Procedure: EMBOLIZATION;  Surgeon: Cephus Shelling, MD;  Location: [facility] INVASIVE CV LAB;  Service: Cardiovascular;;   LEFT HEART CATH AND CORS/GRAFTS ANGIOGRAPHY N/A 06/07/2018   Procedure: LEFT HEART CATH AND CORS/GRAFTS ANGIOGRAPHY;  Surgeon: Marykay Lex, MD;  Location: [facility] INVASIVE CV LAB;  Service: Cardiovascular;  Laterality: N/A;   OPEN REDUCTION INTERNAL FIXATION (ORIF) HAND Left 07/21/2015   Procedure: IRRIGATION AND DEBRIDMENT LEFT INDEX AND MIDDLE FINGER, OPEN REDUCTION INTERNAL FIXATION LEFT INDEX FINGER.;  Surgeon: Dominica Severin, MD;  Location: [facility] OR;  Service: Orthopedics;  Laterality: Left;   PERIPHERAL VASCULAR INTERVENTION  11/19/2023   Procedure: PERIPHERAL VASCULAR INTERVENTION;  Surgeon: Cephus Shelling, MD;  Location: [facility] INVASIVE CV LAB;  Service: Cardiovascular;;   PROSTATE BIOPSY      FAMILY HISTORY:  Family History  Problem Relation Age of Onset   Heart disease Father    Hypertension Father     SOCIAL HISTORY:  Social History   Socioeconomic History   Marital status: Married    Spouse name: Not on file   Number of children: Not on file   Years of education: Not on file   Highest education level: 12th grade  Occupational History   Occupation: Engineer, agricultural: [employer]  Tobacco Use   Smoking status: Former    Current packs/day: 0.00    Average packs/day: 2.0 packs/day for 20.0 years (40.0 ttl pk-yrs)    Types: Cigarettes    Start date:  07/31/1959    Quit date: 07/31/1979    Years since quitting: 44.3   Smokeless tobacco: Former    Types: Chew    Quit date: 07/22/1995  Vaping Use   Vaping status: Never Used  Substance and Sexual Activity   Alcohol use: Yes    Comment: occassional   Drug use: No   Sexual activity: Yes  Other Topics Concern   Not on file  Social History Narrative   Not on file   Social Drivers of Health   Financial Resource Strain: Low Risk  (10/07/2023)   Overall Financial Resource Strain (CARDIA)    Difficulty of Paying Living Expenses: Not very hard  Food Insecurity: No Food Insecurity (11/03/2023)   Hunger Vital Sign    Worried About Running Out of Food in the Last Year: Never true    Ran Out of Food in the Last Year: Never true  Transportation Needs: No Transportation Needs (11/03/2023)   PRAPARE - Transportation    Lack of Transportation (  Medical): No    Lack of Transportation (Non-Medical): No  Physical Activity: Insufficiently Active (10/07/2023)   Exercise Vital Sign    Days of Exercise per Week: 3 days    Minutes of Exercise per Session: 30 min  Stress: No Stress Concern Present (10/07/2023)   [employer] of Occupational Health - Occupational Stress Questionnaire    Feeling of Stress : Only a little  Social Connections: Unknown (10/07/2023)   Social Connection and Isolation Panel [NHANES]    Frequency of Communication with Friends and Family: Three times a week    Frequency of Social Gatherings with Friends and Family: More than three times a week    Attends Religious Services: 1 to 4 times per year    Active Member of [employer] or Organizations: Patient declined    Attends Banker Meetings: Not on file    Marital Status: Married  Intimate Partner Violence: Not At Risk (11/03/2023)   Humiliation, Afraid, Rape, and Kick questionnaire    Fear of Current or Ex-Partner: No    Emotionally Abused: No    Physically Abused: No    Sexually Abused: No    ALLERGIES:  Mobic [meloxicam]  MEDICATIONS:  Current Outpatient Medications  Medication Sig Dispense Refill   acetaminophen (TYLENOL) 650 MG CR tablet Take 1,300 mg by mouth every 8 (eight) hours as needed for pain.     albuterol (VENTOLIN HFA) 108 (90 Base) MCG/ACT inhaler TAKE 2 PUFFS BY MOUTH EVERY 6 HOURS AS NEEDED FOR WHEEZE OR SHORTNESS OF BREATH 18 each 6   ALPRAZolam (XANAX) 0.25 MG tablet TAKE 1 TABLET (0.25 MG TOTAL) BY MOUTH AT BEDTIME AS NEEDED. 30 tablet 2   aspirin 81 MG tablet Take 1 tablet (81 mg total) by mouth every morning. 30 tablet    augmented betamethasone dipropionate (DIPROLENE-AF) 0.05 % cream Apply 1 Application topically 2 (two) times daily as needed (irritation).     cetirizine (ZYRTEC) 10 MG tablet Take 5 mg by mouth daily as needed for allergies.     clopidogrel (PLAVIX) 75 MG tablet Take 1 tablet (75 mg total) by mouth daily. 30 tablet 11   dextromethorphan-guaiFENesin (MUCINEX DM) 30-600 MG 12hr tablet Take 1 tablet by mouth 2 (two) times daily as needed for cough.     fluticasone (FLONASE) 50 MCG/ACT nasal spray Place 1 spray into both nostrils daily as needed for allergies or rhinitis.     isosorbide mononitrate (IMDUR) 30 MG 24 hr tablet TAKE 1 TABLET BY MOUTH EVERY DAY 90 tablet 2   levothyroxine (SYNTHROID) 100 MCG tablet Take 1 tablet (100 mcg total) by mouth daily. 90 tablet 1   losartan (COZAAR) 50 MG tablet TAKE 1 TABLET BY MOUTH EVERY DAY 90 tablet 1   metoprolol succinate (TOPROL-XL) 25 MG 24 hr tablet Take 0.5 tablets (12.5 mg total) by mouth daily. 45 tablet 1   nitroGLYCERIN (NITROSTAT) 0.4 MG SL tablet Place 0.4 mg under the tongue every 5 (five) minutes as needed for chest pain.     NON FORMULARY Pt uses a cpap nightly     omeprazole (PRILOSEC) 20 MG capsule Take 1 capsule (20 mg total) by mouth daily. 90 capsule 1   rosuvastatin (CRESTOR) 40 MG tablet TAKE 1 TABLET BY MOUTH EVERY DAY 90 tablet 1   Saw Palmetto, Serenoa repens, (SAW PALMETTO PO) Take 1  capsule by mouth daily.     tamsulosin (FLOMAX) 0.4 MG CAPS capsule Take 1 capsule (0.4 mg total) by mouth daily. 90  capsule 1   triamcinolone cream (KENALOG) 0.1 % Apply 1 application topically 2 (two) times daily. (Patient taking differently: Apply 1 application  topically 2 (two) times daily as needed (irritation).) 45 g 4   No current facility-administered medications for this encounter.    REVIEW OF SYSTEMS:  On review of systems, the patient reports that he is doing well overall. He denies any chest pain, shortness of breath, cough, fevers, chills, night sweats, unintended weight changes. He denies any bowel disturbances, and denies abdominal pain, nausea or vomiting. He denies any new musculoskeletal or joint aches or pains. His IPSS was 15, indicating moderate urinary symptoms with urgency and frequency but no obstructive voiding symptoms.  He has continued taking Flomax daily as prescribed.  His SHIM was 6, indicating he has severe erectile dysfunction. A complete review of systems is obtained and is otherwise negative.     PHYSICAL EXAM:  Wt Readings from Last 3 Encounters:  12/03/23 198 lb (89.8 kg)  11/19/23 193 lb (87.5 kg)  11/03/23 198 lb 8 oz (90 kg)   Temp Readings from Last 3 Encounters:  11/19/23 98.7 F (37.1 C) (Oral)  11/03/23 (!) 97.5 F (36.4 C) (Temporal)  10/27/23 97.9 F (36.6 C) (Temporal)   BP Readings from Last 3 Encounters:  11/19/23 120/79  11/03/23 135/86  10/27/23 (!) 148/87   Pulse Readings from Last 3 Encounters:  11/19/23 69  11/03/23 65  10/27/23 61   Pain Assessment Pain Score: 0-No pain/10  In general this is a well appearing Caucasian male in no acute distress. He's alert and oriented x4 and appropriate throughout the examination. Cardiopulmonary assessment is negative for acute distress, and he exhibits normal effort.     KPS = 100  100 - Normal; no complaints; no evidence of disease. 90   - Able to carry on normal activity; minor  signs or symptoms of disease. 80   - Normal activity with effort; some signs or symptoms of disease. 11   - Cares for self; unable to carry on normal activity or to do active work. 60   - Requires occasional assistance, but is able to care for most of his personal needs. 50   - Requires considerable assistance and frequent medical care. 40   - Disabled; requires special care and assistance. 30   - Severely disabled; hospital admission is indicated although death not imminent. 20   - Very sick; hospital admission necessary; active supportive treatment necessary. 10   - Moribund; fatal processes progressing rapidly. 0     - Dead  Karnofsky DA, Abelmann WH, Craver LS and Burchenal JH [phone]) The use of the nitrogen mustards in the palliative treatment of carcinoma: with particular reference to bronchogenic carcinoma Cancer 1 634-56  LABORATORY DATA:  Lab Results  Component Value Date   WBC 4.6 06/10/2023   HGB 12.9 (L) 11/19/2023   HCT 38.0 (L) 11/19/2023   MCV 93 06/10/2023   PLT 164 06/10/2023   Lab Results  Component Value Date   NA 139 11/19/2023   K 3.9 11/19/2023   CL 106 11/19/2023   CO2 22 06/10/2023   Lab Results  Component Value Date   ALT 12 06/10/2023   AST 18 06/10/2023   ALKPHOS 51 06/10/2023   BILITOT 1.4 (H) 06/10/2023     RADIOGRAPHY: PERIPHERAL VASCULAR CATHETERIZATION Result Date: 11/19/2023 Images from the original result were not included.   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  Ultrasound-guided access left common femoral artery 2.  Aortogram with catheter selection of aorta 3.  Catheter selection of right internal iliac artery 4.  Coil embolization of right internal iliac artery aneurysm (8 mm x 20 cm, 8 mm x 20 cm, 6 mm x 20 cm interlock  0.035 coils in one outflow branch of aneurysm and 6 mm x 20 cm interlock coil in second outflow branch of aneurysm) 5.  Angioplasty and stent of right common and external iliac artery for coverage of the internal iliac artery (11 mm x 59 mm VBX) 6.  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  He presents today for coil embolizationand possible stenting after risk benefits discussed.  Findings:  Aortogram showed patent renal arteries bilaterally with a patent infrarenal aorta.  Both iliac arteries were widely patent.  The right internal iliac artery aneurysm was identified.   There were 2 main outflow branches of the right internal iliac artery aneurysm.  The right internal iliac artery was then selected and the initial larger outflow blanch of the aneurysm was coiled with a 8 mm x 20 cm, 8 mm x 20 cm and 6 mm x 20 cm interlock coils.  The second outflow branch was covered treated with a 6 mm x 20 cm interlock coil.  There appeared to be no further outflow of the aneurysm.  I then stented across the common and external iliac artery to cover the hypogastric with a 11 mm x 59 mm VBX (to prevent inflow into the aneurysm).  I did have to postdilated up to 12 mm proximally in the stent in order to get seal.  No filling of the aneurysm at completion.             Procedure:  The patient was identified in the holding area and taken to room 8.  The patient was then placed supine on the table and prepped and draped in the usual sterile fashion.  A time out was called.  The patient received Versed and fentanyl for conscious moderate sedation.  Vital signs were monitored including heart rate, respiratory rate, oxygenation and blood pressure.  I was present for all of moderate sedation.  Ultrasound was used to evaluate the left common femoral artery.  It was patent .  A digital ultrasound image was acquired.  A micropuncture  needle was used to access the left common femoral artery under ultrasound guidance.  An 018 wire was advanced without resistance and a micropuncture sheath was placed.  The 018 wire was removed and a benson wire was placed.  I then dilated this with a 8 [place] dilator and placed a Perclose device in the left common femoral artery.  Then placed a short 8 French sheath in the left common femoral artery.  Patient was given 100 units/kg IV heparin.  I went ahead and put a Omni Flush catheter in the infrarenal aorta and abdominal aortogram was obtained.  I then used a Omni Flush catheter with a Glidewire to select the right hypogastric artery.  I then went ahead and advanced the 8 French catapult sheath in the left groin into the right hypogastric aneurysm.  Initially selected the large outflow branch of the right hypogastric aneurysm and this was treated with three coils using 8 mm x 20 cm, 8 mm x 20 cm and 6 mm x 20 cm interlock coils and a quick cross catheter.  We then were able to get into the second smaller outflow branch of the aneurysm and this was treated with one 6 mm x 20 cm interlock coil using a quick cross catheter.  We had completely treated the outflow of the aneurysm.  I then repositioned my wire down the external iliac into the SFA and then treated the right common and external iliac artery with a 11 mm x 59 mm VBX to cover the hypogastric and prevent inflow into the aneurysm.  There was no seal proximally so I had to upsize to a larger balloon in the VBX with a 12 mm Mustang.  No filling of the aneurysm.  Widely patent stent.  Good flow distally.  Wires and catheters were removed.  We tied down the Perclose device in the left groin with good hemostasis.  Plan: Excellent results after coiling of right hypogastric aneurysm and stenting for coverage of the inflow.  Will start Plavix in addition to his aspirin statin.  Follow-up in 1 month.     Cephus Shelling, MD [facility]  [place] Office: [phone]      IMPRESSION/PLAN: 1. 73 y.o. gentleman with Stage T1c adenocarcinoma of the prostate with Gleason score of 4+3, and PSA of 5.  The patient has elected to proceed with seed implant for treatment of his disease. We reviewed the risks, benefits, short and long-term effects associated with brachytherapy and discussed the role of SpaceOAR in reducing the rectal toxicity associated with radiotherapy.  He appears to have a good understanding of his disease and our treatment recommendations which are of curative intent.  He was encouraged to ask questions that were answered to his stated satisfaction. He has freely signed written consent to proceed today in the office and a copy of this document will be placed in his medical record. His procedure is tentatively scheduled for 12/24/23 in collaboration with Dr. Retta Diones and we will see him back for his post-procedure visit approximately 3 weeks thereafter. We look forward to continuing to participate in his care. He knows that he is welcome to call with any questions or concerns at any time in the interim.  I personally spent 30 minutes in this encounter including chart review, reviewing radiological studies, meeting face-to-face with the patient, entering orders and completing documentation.    Marguarite Arbour, MMS, PA-C [place]  [facility] Radiation Oncology Physician Assistant Direct Dial: [phone]  Fax: [phone]

## 2023-12-03 NOTE — Progress Notes (Signed)
Pre-seed nursing interview for a diagnosis of Malignant neoplasm of prostate (HCC).  Patient identity verified x2.   Patient reports polyuria, nocturia 5+, urgency. No other issues conveyed at this time.  Meaningful use complete.  Urinary Management medication(s)- Tamsulosin Urology appointment date- 12/29/2023, with NP Evette Georges at [facility]  Ht 5\' 11"  (1.803 m)   Wt 198 lb (89.8 kg)   BMI 27.62 kg/m   This concludes the interaction.  Ruel Favors, LPN

## 2023-12-04 ENCOUNTER — Telehealth (HOSPITAL_BASED_OUTPATIENT_CLINIC_OR_DEPARTMENT_OTHER): Payer: Self-pay

## 2023-12-04 NOTE — Telephone Encounter (Signed)
   Name: Mark [name]  DOB: May 31, 1951  MRN: [id number]  Primary Cardiologist: Orbie Pyo, MD   Preoperative team, please contact this patient and set up a phone call appointment for further preoperative risk assessment. Please obtain consent and complete medication review. Thank you for your help.  I confirm that guidance regarding antiplatelet and oral anticoagulation therapy has been completed and, if necessary, noted below.  Guidance for holding Plavix will need to come from vascular surgery, cardiology is currently not managing.  I also confirmed the patient resides in the state of [place]. As per [facility] Medical Board telemedicine laws, the patient must reside in the state in which the provider is licensed.   Napoleon Form, Leodis Rains, NP 12/04/2023, 12:46 PM [facility]

## 2023-12-04 NOTE — Telephone Encounter (Signed)
Patient has been schedule for Tele  12/16/23 med has been rec and consent done

## 2023-12-04 NOTE — Telephone Encounter (Signed)
Patient has been schedule for Tele  12/16/23 med has been rec and consent done    Patient Consent for Virtual Visit        WINCHESTER RINEHIMER has provided verbal consent on 12/04/2023 for a virtual visit (video or telephone).   CONSENT FOR VIRTUAL VISIT FOR:  Pryor Montes  By participating in this virtual visit I agree to the following:  I hereby voluntarily request, consent and authorize [facility] and its employed or contracted physicians, physician assistants, nurse practitioners or other licensed health care professionals (the Practitioner), to provide me with telemedicine health care services (the "Services") as deemed necessary by the treating Practitioner. I acknowledge and consent to receive the Services by the Practitioner via telemedicine. I understand that the telemedicine visit will involve communicating with the Practitioner through live audiovisual communication technology and the disclosure of certain medical information by electronic transmission. I acknowledge that I have been given the opportunity to request an in-person assessment or other available alternative prior to the telemedicine visit and am voluntarily participating in the telemedicine visit.  I understand that I have the right to withhold or withdraw my consent to the use of telemedicine in the course of my care at any time, without affecting my right to future care or treatment, and that the Practitioner or I may terminate the telemedicine visit at any time. I understand that I have the right to inspect all information obtained and/or recorded in the course of the telemedicine visit and may receive copies of available information for a reasonable fee.  I understand that some of the potential risks of receiving the Services via telemedicine include:  Delay or interruption in medical evaluation due to technological equipment failure or disruption; Information transmitted may not be sufficient (e.g. poor  resolution of images) to allow for appropriate medical decision making by the Practitioner; and/or  In rare instances, security protocols could fail, causing a breach of personal health information.  Furthermore, I acknowledge that it is my responsibility to provide information about my medical history, conditions and care that is complete and accurate to the best of my ability. I acknowledge that Practitioner's advice, recommendations, and/or decision may be based on factors not within their control, such as incomplete or inaccurate data provided by me or distortions of diagnostic images or specimens that may result from electronic transmissions. I understand that the practice of medicine is not an exact science and that Practitioner makes no warranties or guarantees regarding treatment outcomes. I acknowledge that a copy of this consent can be made available to me via my patient portal [facility] MyChart), or I can request a printed copy by calling the office of [facility].    I understand that my insurance will be billed for this visit.   I have read or had this consent read to me. I understand the contents of this consent, which adequately explains the benefits and risks of the Services being provided via telemedicine.  I have been provided ample opportunity to ask questions regarding this consent and the Services and have had my questions answered to my satisfaction. I give my informed consent for the services to be provided through the use of telemedicine in my medical care     .

## 2023-12-04 NOTE — Telephone Encounter (Signed)
   Pre-operative Risk Assessment    Patient Name: Mark [name]  DOB: 1951-03-07 MRN: [id number]   Date of last office visit: 12/31/2022 Dr. Orbie Pyo Date of next office visit: 01/13/2024 Dr. Orbie Pyo   Request for Surgical Clearance    Procedure:   Brachytherapy and spaceoar  Date of Surgery:  Clearance 12/24/23                                Surgeon:  Dr. Marcine Matar Surgeon's Group or Practice Name:  [facility] [place] Phone number:  [phone] Fax number:  [phone]   Type of Clearance Requested:   - Medical  - Pharmacy:  Hold Clopidogrel (Plavix) Hold 5 days before schedule procedure   Type of Anesthesia:  General    Additional requests/questions:    Merrilee Jansky Kameah Rawl   12/04/2023, 12:31 PM

## 2023-12-05 ENCOUNTER — Other Ambulatory Visit: Payer: Self-pay | Admitting: Family Medicine

## 2023-12-10 ENCOUNTER — Other Ambulatory Visit: Payer: Self-pay

## 2023-12-10 ENCOUNTER — Encounter: Payer: Self-pay | Admitting: Family Medicine

## 2023-12-10 ENCOUNTER — Encounter: Payer: Self-pay | Admitting: Urology

## 2023-12-10 DIAGNOSIS — E785 Hyperlipidemia, unspecified: Secondary | ICD-10-CM

## 2023-12-10 DIAGNOSIS — Z79899 Other long term (current) drug therapy: Secondary | ICD-10-CM

## 2023-12-10 DIAGNOSIS — I10 Essential (primary) hypertension: Secondary | ICD-10-CM

## 2023-12-10 NOTE — Telephone Encounter (Signed)
Nurses Please order lipid, liver, metabolic 7 Diagnosis hyperlipidemia high risk medication and hypertension  Patient to do lab work at his convenience before next visit  There is no need to check thyroid function or A1c at this visit PSA is being followed by his specialist

## 2023-12-11 DIAGNOSIS — E785 Hyperlipidemia, unspecified: Secondary | ICD-10-CM | POA: Diagnosis not present

## 2023-12-11 DIAGNOSIS — I10 Essential (primary) hypertension: Secondary | ICD-10-CM | POA: Diagnosis not present

## 2023-12-11 DIAGNOSIS — Z79899 Other long term (current) drug therapy: Secondary | ICD-10-CM | POA: Diagnosis not present

## 2023-12-12 LAB — BASIC METABOLIC PANEL
BUN/Creatinine Ratio: 12 (ref 10–24)
BUN: 12 mg/dL (ref 8–27)
CO2: 23 mmol/L (ref 20–29)
Calcium: 9.3 mg/dL (ref 8.6–10.2)
Chloride: 104 mmol/L (ref 96–106)
Creatinine, Ser: 1.01 mg/dL (ref 0.76–1.27)
Glucose: 115 mg/dL — ABNORMAL HIGH (ref 70–99)
Potassium: 4.4 mmol/L (ref 3.5–5.2)
Sodium: 141 mmol/L (ref 134–144)
eGFR: 79 mL/min/{1.73_m2} (ref >59)

## 2023-12-12 LAB — LIPID PANEL
Chol/HDL Ratio: 3.4 {ratio} (ref 0.0–5.0)
Cholesterol, Total: 124 mg/dL (ref 100–199)
HDL: 37 mg/dL — ABNORMAL LOW (ref >39)
LDL Chol Calc (NIH): 56 mg/dL (ref 0–99)
Triglycerides: 189 mg/dL — ABNORMAL HIGH (ref 0–149)
VLDL Cholesterol Cal: 31 mg/dL (ref 5–40)

## 2023-12-12 LAB — HEPATIC FUNCTION PANEL
ALT: 12 [IU]/L (ref 0–44)
AST: 19 [IU]/L (ref 0–40)
Albumin: 4.4 g/dL (ref 3.8–4.8)
Alkaline Phosphatase: 56 [IU]/L (ref 44–121)
Bilirubin Total: 1.7 mg/dL — ABNORMAL HIGH (ref 0.0–1.2)
Bilirubin, Direct: 0.44 mg/dL — ABNORMAL HIGH (ref 0.00–0.40)
Total Protein: 6.6 g/dL (ref 6.0–8.5)

## 2023-12-14 ENCOUNTER — Ambulatory Visit (INDEPENDENT_AMBULATORY_CARE_PROVIDER_SITE_OTHER): Payer: Medicare PPO | Admitting: Family Medicine

## 2023-12-14 VITALS — BP 119/70 | HR 59 | Temp 97.7°F | Ht 71.0 in | Wt 198.4 lb

## 2023-12-14 DIAGNOSIS — M25551 Pain in right hip: Secondary | ICD-10-CM

## 2023-12-14 DIAGNOSIS — E785 Hyperlipidemia, unspecified: Secondary | ICD-10-CM

## 2023-12-14 DIAGNOSIS — C61 Malignant neoplasm of prostate: Secondary | ICD-10-CM | POA: Diagnosis not present

## 2023-12-14 DIAGNOSIS — I10 Essential (primary) hypertension: Secondary | ICD-10-CM

## 2023-12-14 NOTE — Progress Notes (Signed)
Subjective:    Patient ID: Mark [name], male    DOB: [birth year], 73 y.o.   MRN: [id number]  Discussed the use of AI scribe software for clinical note transcription with the patient, who gave verbal consent to proceed.  History of Present Illness   The patient, with a history of prostate cancer, presents with concerns about his recent decision to undergo seed implantation for treatment. He expresses concern about whether this was the best choice, compared to other options such as surgery or radiation. He also reports difficulty sleeping, often waking up after three hours and moving to a recliner to rest. The patient attributes this insomnia to stress and worry about his health and treatment decisions.  In addition to his prostate cancer, the patient has a history of lung congestion, which he has been managing for years. He reports a daily need for an albuterol inhaler due to a tickling sensation and coughing, which he believes is due to mucus congestion rather than airway restriction.  The patient is also on clopidogrel, a blood thinner, and reports no issues with bruising or bleeding. He denies any presence of blood in his urine. He also denies any chest pressure, tightness, pain, or discomfort with physical activity. He is currently taking rosuvastatin for cholesterol management and tamsulosin at night to help with prostate relaxation.  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 Since this procedure, he has experienced right hip pain when walking or performing strenuous activities such as splitting wood. The pain forces him to slow down or stop his activities.  The patient's recent labs show slightly elevated fasting sugar levels, indicating a risk of developing diabetes in the future. He has been advised to be mindful of his dietary habits,  particularly his intake of starchy foods.         Review of Systems     Objective:    Physical Exam   CHEST: Clear to auscultation bilaterally. CARDIOVASCULAR: Normal heart sounds. EXTREMITIES: No ankle edema.     General-in no acute distress Eyes-no discharge Lungs-respiratory rate normal, CTA CV-no murmurs,RRR Extremities skin warm dry no edema Neuro grossly normal Behavior normal, alert   Results for orders placed or performed in visit on 12/10/23  Lipid Panel   Collection Time: 12/11/23  8:23 AM  Result Value Ref Range   Cholesterol, Total 124 100 - 199 mg/dL   Triglycerides 956 (H) 0 - 149 mg/dL   HDL 37 (L) >21 mg/dL   VLDL Cholesterol Cal 31 5 - 40 mg/dL   LDL Chol Calc (NIH) 56 0 - 99 mg/dL   Chol/HDL Ratio 3.4 0.0 - 5.0 ratio  Hepatic Function Panel   Collection Time: 12/11/23  8:23 AM  Result Value Ref Range   Total Protein 6.6 6.0 - 8.5 g/dL   Albumin 4.4 3.8 - 4.8 g/dL   Bilirubin Total 1.7 (H) 0.0 - 1.2 mg/dL   Bilirubin, Direct 3.08 (H) 0.00 - 0.40 mg/dL   Alkaline Phosphatase 56 44 - 121 IU/L   AST 19 0 - 40 IU/L   ALT 12 0 - 44 IU/L  Basic Metabolic Panel   Collection Time: 12/11/23  8:23 AM  Result Value Ref Range   Glucose 115 (H) 70 - 99 mg/dL   BUN 12 8 - 27 mg/dL   Creatinine, Ser 6.57 0.76 - 1.27 mg/dL  eGFR 79 >59 mL/min/1.73   BUN/Creatinine Ratio 12 10 - 24   Sodium 141 134 - 144 mmol/L   Potassium 4.4 3.5 - 5.2 mmol/L   Chloride 104 96 - 106 mmol/L   CO2 23 20 - 29 mmol/L   Calcium 9.3 8.6 - 10.2 mg/dL       Assessment & Plan:  Assessment and Plan    Insomnia Likely secondary to stress and anxiety. Patient reports difficulty sleeping at night, often falling asleep in a recliner. No specific interventions discussed. -Consider stress management techniques such as creating a worry list.  Prostate Cancer Patient chose seed implantation over surgery or radiation. No current side effects reported. -Continue current  management.  Chronic Lung Disease Daily cough and congestion, uses Albuterol inhaler daily. Discussed that Albuterol is more effective for airway restriction rather than mucus production. -Continue Albuterol as needed for airway restriction.  Atherosclerotic Cardiovascular Disease On Clopidogrel, no bleeding or bruising reported. No chest pain or discomfort with activity. -Continue Clopidogrel.  Hyperlipidemia On Rosuvastatin, LDL 56. -Continue Rosuvastatin.  Benign Prostatic Hyperplasia On Tamsulosin, no current urinary symptoms reported. -Continue Tamsulosin.  Hypothyroidism On thyroid medication, last TSH normal. -Continue current thyroid medication.  Pre-Diabetes Recent fasting glucose slightly elevated, last A1C normal. Discussed importance of diet in managing pre-diabetes. -Monitor A1C annually, next check in summer 2025.  Right Hip Pain New onset after recent vascular procedure, worsens with activity. Discussed possibility of blood flow issue vs joint issue. -Consider hip X-ray to evaluate joint. he is getting a CT scan of the pelvis one can see how the hip looks on that hopefully  General Health Maintenance -Consider flu shot annually. -Consider repeat colonoscopy in 2024, pending results of previous polyp biopsy. -Continue Tylenol Arthritis as needed for pain, not to exceed 3000mg  daily. -Follow-up in late July 2025, sooner if any problems arise.     Labs reviewed in detail questions answered  1. Essential hypertension, benign (Primary) HTN- patient seen for follow-up regarding HTN.   Diet, medication compliance, appropriate labs and refills were completed.   Importance of keeping blood pressure under good control to lessen the risk of complications discussed Regular follow-up visits discussed   2. Hyperlipidemia, unspecified hyperlipidemia type Hyperlipidemia-importance of diet, weight control, activity, compliance with medications discussed.   Recent labs  reviewed.   Any additional labs or refills ordered.   Importance of keeping under good control discussed. Regular follow-up visits discussed   3. Prostate cancer [facility]) Patient has scan coming up, also has radiation seeds coming up.  4. Right hip pain It is possible that this could be some claudication symptoms he is having since having his aneurysm repaired he has an appointment coming up with vascular he will discuss it with them at that time

## 2023-12-15 ENCOUNTER — Ambulatory Visit: Payer: Medicare PPO | Admitting: Urology

## 2023-12-16 ENCOUNTER — Ambulatory Visit (INDEPENDENT_AMBULATORY_CARE_PROVIDER_SITE_OTHER): Payer: Medicare PPO | Admitting: Physician Assistant

## 2023-12-16 ENCOUNTER — Ambulatory Visit (HOSPITAL_COMMUNITY)
Admission: RE | Admit: 2023-12-16 | Discharge: 2023-12-16 | Disposition: A | Discharge status: Home / Self Care | Payer: Medicare PPO | Source: Ambulatory Visit | Attending: Vascular Surgery | Admitting: Vascular Surgery

## 2023-12-16 DIAGNOSIS — I714 Abdominal aortic aneurysm, without rupture, unspecified: Secondary | ICD-10-CM | POA: Diagnosis not present

## 2023-12-16 DIAGNOSIS — N200 Calculus of kidney: Secondary | ICD-10-CM | POA: Diagnosis not present

## 2023-12-16 DIAGNOSIS — I723 Aneurysm of iliac artery: Secondary | ICD-10-CM | POA: Insufficient documentation

## 2023-12-16 DIAGNOSIS — Z01810 Encounter for preprocedural cardiovascular examination: Secondary | ICD-10-CM

## 2023-12-16 DIAGNOSIS — I700 Atherosclerosis of aorta: Secondary | ICD-10-CM | POA: Diagnosis not present

## 2023-12-16 MED ORDER — IOHEXOL 350 MG/ML SOLN
100.0000 mL | Freq: Once | INTRAVENOUS | Status: AC | PRN
  Administered 2023-12-16: 100 mL via INTRAVENOUS

## 2023-12-16 NOTE — Progress Notes (Signed)
Virtual Visit via Telephone Note   Because of Mark [name]'s co-morbid illnesses, he is at least at moderate risk for complications without adequate follow up.  This format is felt to be most appropriate for this patient at this time.  The patient did not have access to video technology/had technical difficulties with video requiring transitioning to audio format only (telephone).  All issues noted in this document were discussed and addressed.  No physical exam could be performed with this format.  Please refer to the patient's chart for his consent to telehealth for [facility].  Evaluation Performed:  Preoperative cardiovascular risk assessment _____________   Date:  12/16/2023   Patient ID:  Mark [name], DOB 1951-05-08, MRN [id number] Patient Location:  [facility] Provider location:   Office  Primary Care Provider:  Babs Sciara, MD Primary Cardiologist:  Orbie Pyo, MD  Chief Complaint / Patient Profile   73 y.o. y/o male with a h/o   Coronary artery disease S/p CABG in 2006 LHC 06/07/2018: LIMA-LAD patent TTE 06/04/2018: EF 60-65 Peripheral arterial disease S/p coil embolization of R internal iliac artery, stent to right CIA and EIA 11/2023 (Dr. Chestine Spore) Hypertension Hyperlipidemia OSA  He is pending urologic procedure with Dr. Retta Diones 12/24/2023 under general anesthesia and presents today for telephonic preoperative cardiovascular risk assessment.  It has been requested that the patient hold Plavix prior to the procedure.  However, this medication is managed by his vascular surgeon, Dr. Chestine Spore.  History of Present Illness    Mark [name] is a 73 y.o. male who presents via audio/video conferencing for a telehealth visit today.  Pt was last seen in cardiology clinic on 12/31/2022 by Dr. Lynnette Caffey.  At that time Mark [name] was doing well.  The patient is now pending procedure as outlined above. Since his last visit, he has done well  without chest pain, shortness of breath or syncope.  He recently had an iliac artery aneurysm embolized and placement of right iliac artery stent by Dr. Chestine Spore.  He has been somewhat limited in activity since then but is improved.  He has follow-up soon with vascular surgery.  Physical Exam    Vital Signs:  Mark [name] does not have vital signs available for review today.  Given telephonic nature of communication, physical exam is limited. AAOx3. NAD. Normal affect.  Speech and respirations are unlabored.  Accessory Clinical Findings    None  Assessment & Plan    Assessment & Plan Preoperative cardiovascular examination Mark [name] perioperative risk of a major cardiac event is 0.9% according to the Revised Cardiac Risk Index (RCRI).  Therefore, he is at low risk for perioperative complications.   His functional capacity is good at 4.73 METs according to the Duke Activity Status Index (DASI). Recommendations: According to ACC/AHA guidelines, no further cardiovascular testing needed.  The patient may proceed to surgery at acceptable risk.   Antiplatelet and/or Anticoagulation Recommendations: The patient should remain on Aspirin without interruption.  If the risk of bleeding is too great, aspirin can be held for 5-7 days and resume postop when felt to be safe. (Clopidogrel) Plavix is managed by vascular surgery.   The patient was advised that if he develops new symptoms prior to surgery to contact our office to arrange for a follow-up visit, and he verbalized understanding.  A copy of this note will be routed to requesting surgeon.  Time:   Today, I have spent 5 minutes with the patient  with telehealth technology discussing medical history, symptoms, and management plan.    Tereso Newcomer, PA-C 12/16/2023, 12:43 PM

## 2023-12-16 NOTE — Telephone Encounter (Signed)
Notes faxed to surgeon. This phone note will be removed from the preop pool. Tereso Newcomer, PA-C  12/16/2023 1:38 PM

## 2023-12-21 ENCOUNTER — Encounter (HOSPITAL_BASED_OUTPATIENT_CLINIC_OR_DEPARTMENT_OTHER): Payer: Self-pay | Admitting: Urology

## 2023-12-21 NOTE — Progress Notes (Signed)
RN left message with patient for call back if he has any questions prior to upcoming brachytherapy on 2/6.

## 2023-12-21 NOTE — H&P (Incomplete)
H&P  Chief Complaint: Prostate cancer  History of Present Illness: This gentleman presents for I-125 brachytherapy/SpaceOAR.  He has intermediate risk prostate cancer which was diagnosed in November, 2024.  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  This was subsequently embolized by Dr. Chestine Spore on January 2.  He has been on Plavix which was discontinued several days ago.  Past Medical History:  Diagnosis Date   Abnormal nuclear stress test false positive 05/2018   BPH (benign prostatic hyperplasia) 04/21/2019   Cancer (HCC)    Chest pain stable CAD with cath 06/07/18 possible GI 06/03/2018   Coronary artery disease    a. s/p CABG 2005. b. cath 05/2018 - patent LIMA-LAD, moderate LM and prox OM disease with neg FFR.   Dyslipidemia    Elevated PSA    Esophageal stricture    ESOPHAGEAL STRICTURE 12/18/2008   Qualifier: History of  By: Candice Camp CMA (AAMA), Dottie     ESOPHAGITIS 12/18/2008   Qualifier: History of  By: Candice Camp CMA (AAMA), Dottie     Essential hypertension, benign 04/06/2013   Gastrointestinal bleeding    GERD (gastroesophageal reflux disease)    History of colonic polyps 08/26/2014   [facility] [place] during colonoscopy in May of 2015. They recommend repeat colonoscopy May of 2017 do to inadequate prep, pathology showed hyperplastic polyp    Hyperlipidemia    Hypothyroidism    Insomnia 04/21/2019   Obstructive sleep apnea 07/11/2013   Has mixed apnea, CPAP setting 7    OSA (obstructive sleep apnea)    RECTAL BLEEDING 12/18/2008   Qualifier: Diagnosis of  By: Candice Camp CMA (AAMA), Dottie     Urinary retention    Vitiligo 07/11/2013    Past Surgical History:  Procedure Laterality Date   ABDOMINAL AORTOGRAM W/LOWER EXTREMITY N/A 11/19/2023   Procedure: ABDOMINAL AORTOGRAM W/LOWER EXTREMITY;  Surgeon: Cephus Shelling, MD;   Location: [facility] INVASIVE CV LAB;  Service: Cardiovascular;  Laterality: N/A;   ANTERIOR CERVICAL DECOMP/DISCECTOMY FUSION N/A 10/29/2022   Procedure: CERVICAL FIVE-SIX ANTERIOR CERVICAL DECOMPRESSION/DISCECTOMY FUSION;  Surgeon: Donalee Citrin, MD;  Location[facility] OR;  Service: Neurosurgery;  Laterality: N/A;  3C   APPENDECTOMY     bilateral inguinal hernia repair     CARDIAC CATHETERIZATION  12/2005, 06/2005   CHOLECYSTECTOMY N/A 08/04/2022   Procedure: LAPAROSCOPIC CHOLECYSTECTOMY;  Surgeon: Lewie Chamber, DO;  Location: [facility] ORS;  Service: General;  Laterality: N/A;   COLONOSCOPY     CORONARY ARTERY BYPASS GRAFT  11/17/2004   LIMA to LAD, off pump   CORONARY PRESSURE/FFR STUDY N/A 06/07/2018   Procedure: INTRAVASCULAR PRESSURE WIRE/FFR STUDY;  Surgeon: Marykay Lex, MD;  Location: [facility] INVASIVE CV LAB;  Service: Cardiovascular;  Laterality: N/A;   EMBOLIZATION (CATH LAB)  11/19/2023   Procedure: EMBOLIZATION;  Surgeon: Cephus Shelling, MD;  Location: [facility] INVASIVE CV LAB;  Service: Cardiovascular;;   LEFT HEART CATH AND CORS/GRAFTS ANGIOGRAPHY N/A 06/07/2018   Procedure: LEFT HEART CATH AND CORS/GRAFTS ANGIOGRAPHY;  Surgeon: Marykay Lex, MD;  Location: [facility] INVASIVE CV LAB;  Service: Cardiovascular;  Laterality: N/A;   OPEN REDUCTION INTERNAL FIXATION (ORIF) HAND Left 07/21/2015   Procedure: IRRIGATION AND DEBRIDMENT LEFT INDEX AND MIDDLE FINGER, OPEN REDUCTION INTERNAL FIXATION LEFT INDEX FINGER.;  Surgeon: Dominica Severin, MD;  Location: [facility] OR;  Service: Orthopedics;  Laterality: Left;   PERIPHERAL VASCULAR INTERVENTION  11/19/2023  Procedure: PERIPHERAL VASCULAR INTERVENTION;  Surgeon: Cephus Shelling, MD;  Location[facility] INVASIVE CV LAB;  Service: Cardiovascular;;   PROSTATE BIOPSY      Home Medications:  Allergies as of 12/21/2023       Reactions   Mobic [meloxicam] Rash        Medication List      Notice   Cannot display discharge medications because the patient has  not yet been admitted.     Allergies:  Allergies  Allergen Reactions   Mobic [Meloxicam] Rash    Family History  Problem Relation Age of Onset   Heart disease Father    Hypertension Father     Social History:  reports that he quit smoking about 44 years ago. His smoking use included cigarettes. He started smoking about 64 years ago. He has a 40 pack-year smoking history. He quit smokeless tobacco use about 28 years ago.  His smokeless tobacco use included chew. He reports current alcohol use. He reports that he does not use drugs.  ROS: A complete review of systems was performed.  All systems are negative except for pertinent findings as noted.  Physical Exam:  Vital signs in last 24 hours: Ht 5\' 11"  (1.803 m)   Wt 90 kg   BMI 27.67 kg/m  Constitutional:  Alert and oriented, No acute distress Cardiovascular: Regular rate  Respiratory: Normal respiratory effort  Neurologic: Grossly intact, no focal deficits Psychiatric: Normal mood and affect  I have reviewed prior pt notes  I have reviewed notes from referring/previous physicians  I have reviewed urinalysis results  I have reviewed prior pathology and PSA results    Impression/Assessment:  Grade group 3 prostate cancer  Plan:  I-125 brachytherapy/SpaceOAR placement

## 2023-12-21 NOTE — Progress Notes (Addendum)
Spoke w/ via phone for pre-op interview---[facility] needs dos---- ISTAT per anesthesia.     Lab results------Current EKG in Epic and chart dated 11/19/23 COVID test -----patient states asymptomatic no test needed Arrive at -------1150 NPO after MN NO Solid Food.  Clear liquids from MN until---1050 Med rec completed Medications to take morning of surgery -----Synthroid, Imdur, Metoprolol and Prilosec. Diabetic medication ----- Patient instructed no nail polish to be worn day of surgery Patient instructed to bring photo id and insurance card day of surgery Patient aware to have Driver (ride ) / caregiver    for 24 hours after surgery - WIfe Sanchez [place] Patient Special Instructions ----- Pre-Op special Instructions -----FLEETS enema preop, pt verbalized understanding. Patient verbalized understanding of instructions that were given at this phone interview. Patient denies chest pain, sob, fever, cough at the interview.   Cardiac clearance on chart and in Epic dated 12/16/23 by Tereso Newcomer, PA-C. Per cardiology instructions pt should remain on ASA. Pt verbalized understanding.  Plavix should be held started Saturday February 1,2025. Per instructions given by Dr Chestine Spore to Dr Retta Diones.pt verbalized understanding.

## 2023-12-21 NOTE — Progress Notes (Signed)
 Patient name: Mark [name] MRN: [id number] DOB: 23-Sep-1951 Sex: male  REASON FOR CONSULT: Post-op  HPI: Mark [name] is a 73 y.o. male, with history of coronary artery disease s/p CABG, hypertension, hyperlipidemia, obstructive sleep apnea, prostate cancer that presents for post-op check after repair of right internal iliac aneurysm.  This was discovered incidentally on a CT scan 2024 for workup of his prostate cancer.  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  Today states that after discharge he noticed some burning in his right buttock.  Initially this happened walking to his building and now he is walking up to a quarter of a mile with continued improvement.  Only in his right buttock.  No lower extremity complaints.  Has been on Plavix  and aspirin .  Stopping Plavix  for procedure next week.  Past Medical History:  Diagnosis Date   Abnormal nuclear stress test false positive 05/2018   BPH (benign prostatic hyperplasia) 04/21/2019   Cancer (HCC)    Chest pain stable CAD with cath 06/07/18 possible GI 06/03/2018   Coronary artery disease    a. s/p CABG 2005. b. cath 05/2018 - patent LIMA-LAD, moderate LM and prox OM disease with neg FFR.   Dyslipidemia    Elevated PSA    Esophageal stricture    ESOPHAGEAL STRICTURE 12/18/2008   Qualifier: History of  By: Marcelo CMA (AAMA), Dottie     ESOPHAGITIS 12/18/2008   Qualifier: History of  By: Marcelo CMA (AAMA), Dottie     Essential hypertension, benign 04/06/2013   Gastrointestinal bleeding    GERD (gastroesophageal reflux disease)    History of colonic polyps 08/26/2014   [facility] [place]  during colonoscopy in May of 2015. They recommend repeat colonoscopy May of 2017 do to inadequate prep, pathology showed hyperplastic  polyp    Hyperlipidemia    Hypothyroidism    Insomnia 04/21/2019   Obstructive sleep apnea 07/11/2013   Has mixed apnea, CPAP setting 7    OSA (obstructive sleep apnea)    RECTAL BLEEDING 12/18/2008   Qualifier: Diagnosis of  By: Marcelo CMA (AAMA), Dottie     Urinary retention    Vitiligo 07/11/2013    Past Surgical History:  Procedure Laterality Date   ABDOMINAL AORTOGRAM W/LOWER EXTREMITY N/A 11/19/2023   Procedure: ABDOMINAL AORTOGRAM W/LOWER EXTREMITY;  Surgeon: [name] Lonni PARAS, MD;  Location: [facility] INVASIVE CV LAB;  Service: Cardiovascular;  Laterality: N/A;   ANTERIOR CERVICAL DECOMP/DISCECTOMY FUSION N/A 10/29/2022   Procedure: CERVICAL FIVE-SIX ANTERIOR CERVICAL DECOMPRESSION/DISCECTOMY FUSION;  Surgeon: Onetha Kuba, MD;  Location: [facility] OR;  Service: Neurosurgery;  Laterality: N/A;  3C   APPENDECTOMY     bilateral inguinal hernia repair     CARDIAC CATHETERIZATION  12/2005, 06/2005   CHOLECYSTECTOMY N/A 08/04/2022   Procedure: LAPAROSCOPIC CHOLECYSTECTOMY;  Surgeon: Evonnie Dorothyann LABOR, DO;  Location: [facility] ORS;  Service: General;  Laterality: N/A;   COLONOSCOPY     CORONARY ARTERY BYPASS GRAFT  11/17/2004   LIMA to LAD, off pump   CORONARY PRESSURE/FFR STUDY N/A 06/07/2018   Procedure: INTRAVASCULAR PRESSURE WIRE/FFR STUDY;  Surgeon: Anner Alm ORN, MD;  Location: [facility] INVASIVE CV LAB;  Service: Cardiovascular;  Laterality: N/A;   EMBOLIZATION (CATH LAB)  11/19/2023   Procedure: EMBOLIZATION;  Surgeon: [name] Lonni PARAS, MD;  Location: [facility]  INVASIVE CV LAB;  Service: Cardiovascular;;   LEFT HEART CATH AND CORS/GRAFTS ANGIOGRAPHY N/A 06/07/2018   Procedure: LEFT HEART CATH AND CORS/GRAFTS ANGIOGRAPHY;  Surgeon: Anner Alm ORN, MD;  Location: [facility] INVASIVE CV LAB;  Service: Cardiovascular;  Laterality: N/A;   OPEN REDUCTION INTERNAL FIXATION (ORIF) HAND Left 07/21/2015   Procedure: IRRIGATION AND DEBRIDMENT LEFT INDEX AND MIDDLE FINGER, OPEN REDUCTION INTERNAL FIXATION LEFT  INDEX FINGER.;  Surgeon: Elsie Mussel, MD;  Location: [facility] OR;  Service: Orthopedics;  Laterality: Left;   PERIPHERAL VASCULAR INTERVENTION  11/19/2023   Procedure: PERIPHERAL VASCULAR INTERVENTION;  Surgeon: [name] Lonni PARAS, MD;  Location: [facility] INVASIVE CV LAB;  Service: Cardiovascular;;   PROSTATE BIOPSY      Family History  Problem Relation Age of Onset   Heart disease Father    Hypertension Father     SOCIAL HISTORY: Social History   Socioeconomic History   Marital status: Married    Spouse name: Not on file   Number of children: Not on file   Years of education: Not on file   Highest education level: 12th grade  Occupational History   Occupation: Engineer, Agricultural: [employer]  Tobacco Use   Smoking status: Former    Current packs/day: 0.00    Average packs/day: 2.0 packs/day for 20.0 years (40.0 ttl pk-yrs)    Types: Cigarettes    Start date: 07/31/1959    Quit date: 07/31/1979    Years since quitting: 44.4   Smokeless tobacco: Former    Types: Chew    Quit date: 07/22/1995  Vaping Use   Vaping status: Never Used  Substance and Sexual Activity   Alcohol use: Yes    Comment: occassional   Drug use: No   Sexual activity: Yes  Other Topics Concern   Not on file  Social History Narrative   Not on file   Social Drivers of Health   Financial Resource Strain: Low Risk  (12/13/2023)   Overall Financial Resource Strain (CARDIA)    Difficulty of Paying Living Expenses: Not very hard  Food Insecurity: Patient Declined (12/13/2023)   Hunger Vital Sign    Worried About Running Out of Food in the Last Year: Patient declined    Ran Out of Food in the Last Year: Patient declined  Transportation Needs: No Transportation Needs (12/13/2023)   PRAPARE - Administrator, Civil Service (Medical): No    Lack of Transportation (Non-Medical): No  Physical Activity: Insufficiently Active (12/13/2023)   Exercise Vital Sign    Days of Exercise per Week: 3 days     Minutes of Exercise per Session: 30 min  Stress: No Stress Concern Present (12/13/2023)   [employer] of Occupational Health - Occupational Stress Questionnaire    Feeling of Stress : Only a little  Social Connections: Unknown (12/13/2023)   Social Connection and Isolation Panel [NHANES]    Frequency of Communication with Friends and Family: Patient declined    Frequency of Social Gatherings with Friends and Family: Three times a week    Attends Religious Services: Patient declined    Active Member of Clubs or Organizations: Patient declined    Attends Banker Meetings: Not on file    Marital Status: Married  Intimate Partner Violence: Not At Risk (11/03/2023)   Humiliation, Afraid, Rape, and Kick questionnaire    Fear of Current or Ex-Partner: No    Emotionally Abused: No    Physically Abused: No    Sexually Abused:  No    Allergies  Allergen Reactions   Mobic [Meloxicam] Rash    Current Outpatient Medications  Medication Sig Dispense Refill   acetaminophen  (TYLENOL ) 650 MG CR tablet Take 1,300 mg by mouth every 8 (eight) hours as needed for pain.     albuterol  (VENTOLIN  HFA) 108 (90 Base) MCG/ACT inhaler TAKE 2 PUFFS BY MOUTH EVERY 6 HOURS AS NEEDED FOR WHEEZE OR SHORTNESS OF BREATH 18 each 6   ALPRAZolam  (XANAX ) 0.25 MG tablet TAKE 1 TABLET (0.25 MG TOTAL) BY MOUTH AT BEDTIME AS NEEDED. 30 tablet 2   aspirin  81 MG tablet Take 1 tablet (81 mg total) by mouth every morning. 30 tablet    augmented betamethasone dipropionate (DIPROLENE-AF) 0.05 % cream Apply 1 Application topically 2 (two) times daily as needed (irritation).     cetirizine (ZYRTEC) 10 MG tablet Take 5 mg by mouth daily as needed for allergies.     clopidogrel  (PLAVIX ) 75 MG tablet Take 1 tablet (75 mg total) by mouth daily. 30 tablet 11   dextromethorphan-guaiFENesin (MUCINEX DM) 30-600 MG 12hr tablet Take 1 tablet by mouth 2 (two) times daily as needed for cough.     fluticasone (FLONASE) 50 MCG/ACT  nasal spray Place 1 spray into both nostrils daily as needed for allergies or rhinitis.     isosorbide  mononitrate (IMDUR ) 30 MG 24 hr tablet TAKE 1 TABLET BY MOUTH EVERY DAY 90 tablet 2   levothyroxine  (SYNTHROID ) 100 MCG tablet TAKE 1 TABLET BY MOUTH EVERY DAY 90 tablet 1   losartan  (COZAAR ) 50 MG tablet TAKE 1 TABLET BY MOUTH EVERY DAY 90 tablet 1   metoprolol  succinate (TOPROL -XL) 25 MG 24 hr tablet Take 0.5 tablets (12.5 mg total) by mouth daily. 45 tablet 1   nitroGLYCERIN  (NITROSTAT ) 0.4 MG SL tablet Place 0.4 mg under the tongue every 5 (five) minutes as needed for chest pain. NEVER USED     NON FORMULARY Pt uses a cpap nightly     omeprazole  (PRILOSEC) 20 MG capsule Take 1 capsule (20 mg total) by mouth daily. 90 capsule 1   rosuvastatin  (CRESTOR ) 40 MG tablet TAKE 1 TABLET BY MOUTH EVERY DAY 90 tablet 1   Saw Palmetto , Serenoa repens, (SAW PALMETTO  PO) Take 1 capsule by mouth daily.     tamsulosin  (FLOMAX ) 0.4 MG CAPS capsule Take 1 capsule (0.4 mg total) by mouth daily. 90 capsule 1   triamcinolone  cream (KENALOG ) 0.1 % Apply 1 application topically 2 (two) times daily. (Patient taking differently: Apply 1 application  topically 2 (two) times daily as needed (irritation).) 45 g 4   No current facility-administered medications for this visit.    REVIEW OF SYSTEMS:  [X]  denotes positive finding, [ ]  denotes negative finding Cardiac  Comments:  Chest pain or chest pressure:    Shortness of breath upon exertion:    Short of breath when lying flat:    Irregular heart rhythm:        Vascular    Pain in calf, thigh, or hip brought on by ambulation: x Right buttock  Pain in feet at night that wakes you up from your sleep:     Blood clot in your veins:    Leg swelling:         Pulmonary    Oxygen at home:    Productive cough:     Wheezing:         Neurologic    Sudden weakness in arms or legs:  Sudden numbness in arms or legs:     Sudden onset of difficulty speaking or  slurred speech:    Temporary loss of vision in one eye:     Problems with dizziness:         Gastrointestinal    Blood in stool:     Vomited blood:         Genitourinary    Burning when urinating:     Blood in urine:        Psychiatric    Major depression:         Hematologic    Bleeding problems:    Problems with blood clotting too easily:        Skin    Rashes or ulcers:        Constitutional    Fever or chills:      PHYSICAL EXAM: There were no vitals filed for this visit.   GENERAL: The patient is a well-nourished male, in no acute distress. The vital signs are documented above. CARDIAC: There is a regular rate and rhythm.  VASCULAR:  Bilateral femoral pulses palpable Bilateral DP pulses palpable PULMONARY: No respiratory distress ABDOMEN: Soft and non-tender. MUSCULOSKELETAL: There are no major deformities or cyanosis. NEUROLOGIC: No focal weakness or paresthesias are detected. SKIN: There are no ulcers or rashes noted. PSYCHIATRIC: The patient has a normal affect.  DATA:   CTA reviewed from 12/16/2023 with coiling of the right internal iliac artery and stenting across the hypogastric in the iliac artery with no flow into the aneurysm.  Assessment/Plan:  73 y.o. 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  This was discovered incidentally on a CT scan 2024 for workup of his prostate cancer.  On 11/19/2023 he underwent coiling of the right internal iliac artery with angioplasty and stenting of the right common and external iliac artery with 11 x 59 VBX.  CT shows good technical results with no flow into the aneurysm.  He does complain of some right buttock claudication.  This is improving and he is now walking up to a quarter of a mile.  Discussed this is from coil embolization of his right  hypogastric to appropriately treat his aneurysm.  Fortunately his left hypogastric is widely patent and he on CT the right hypogastric branches fill distal to the coils.  I suspect this will continue to improve with collateralization.  I will have him follow-up with me in 6 months with aortoiliac duplex for ongoing surveillance.  Discussed he can stop the Plavix  as he is having a procedure and continue aspirin  statin.  If he has worsening right buttock symptoms discussed he let me know.  Needs to walk 3x a week as we discussed today and no restrictions.   Lonni DOROTHA Gaskins, MD [facility] Office: [phone]

## 2023-12-22 ENCOUNTER — Ambulatory Visit (INDEPENDENT_AMBULATORY_CARE_PROVIDER_SITE_OTHER): Payer: Medicare PPO | Admitting: Vascular Surgery

## 2023-12-22 ENCOUNTER — Encounter: Payer: Self-pay | Admitting: Vascular Surgery

## 2023-12-22 VITALS — BP 123/74 | HR 66 | Temp 98.2°F | Resp 18 | Ht 71.0 in | Wt 200.0 lb

## 2023-12-22 DIAGNOSIS — I723 Aneurysm of iliac artery: Secondary | ICD-10-CM

## 2023-12-23 ENCOUNTER — Telehealth: Payer: Self-pay | Admitting: *Deleted

## 2023-12-23 NOTE — Telephone Encounter (Signed)
 Called patient to remind of procedure for 12-24-23, spoke with patient and he is aware of this procedure

## 2023-12-23 NOTE — Progress Notes (Signed)
 Fax received from [facility] on 11/30/23 for medical clearance/medication hold for brachytherapy and spaceoar procedure to be signed by C. [name], MD.  Provider signed on 12/08/23, scanned into pt's chart, faxed back to sender on 12/10/23.

## 2023-12-24 ENCOUNTER — Ambulatory Visit (HOSPITAL_BASED_OUTPATIENT_CLINIC_OR_DEPARTMENT_OTHER)
Admission: RE | Admit: 2023-12-24 | Discharge: 2023-12-24 | Disposition: A | Discharge status: Home / Self Care | Payer: Medicare PPO | Attending: Urology | Admitting: Urology

## 2023-12-24 ENCOUNTER — Other Ambulatory Visit: Payer: Self-pay

## 2023-12-24 ENCOUNTER — Encounter (HOSPITAL_BASED_OUTPATIENT_CLINIC_OR_DEPARTMENT_OTHER)
Admission: RE | Disposition: A | Discharge status: Home / Self Care | Payer: Self-pay | Source: Home / Self Care | Attending: Urology

## 2023-12-24 ENCOUNTER — Ambulatory Visit (HOSPITAL_COMMUNITY): Payer: Medicare PPO

## 2023-12-24 ENCOUNTER — Ambulatory Visit (HOSPITAL_BASED_OUTPATIENT_CLINIC_OR_DEPARTMENT_OTHER): Payer: Medicare PPO | Admitting: Certified Registered"

## 2023-12-24 ENCOUNTER — Encounter (HOSPITAL_BASED_OUTPATIENT_CLINIC_OR_DEPARTMENT_OTHER): Payer: Self-pay | Admitting: Urology

## 2023-12-24 DIAGNOSIS — E039 Hypothyroidism, unspecified: Secondary | ICD-10-CM | POA: Insufficient documentation

## 2023-12-24 DIAGNOSIS — I10 Essential (primary) hypertension: Secondary | ICD-10-CM

## 2023-12-24 DIAGNOSIS — Z191 Hormone sensitive malignancy status: Secondary | ICD-10-CM | POA: Diagnosis not present

## 2023-12-24 DIAGNOSIS — K219 Gastro-esophageal reflux disease without esophagitis: Secondary | ICD-10-CM | POA: Insufficient documentation

## 2023-12-24 DIAGNOSIS — Z7902 Long term (current) use of antithrombotics/antiplatelets: Secondary | ICD-10-CM | POA: Diagnosis not present

## 2023-12-24 DIAGNOSIS — C61 Malignant neoplasm of prostate: Secondary | ICD-10-CM | POA: Diagnosis not present

## 2023-12-24 DIAGNOSIS — I2510 Atherosclerotic heart disease of native coronary artery without angina pectoris: Secondary | ICD-10-CM

## 2023-12-24 HISTORY — PX: SPACE OAR INSTILLATION: SHX6769

## 2023-12-24 HISTORY — PX: RADIOACTIVE SEED IMPLANT: SHX5150

## 2023-12-24 HISTORY — DX: Malignant (primary) neoplasm, unspecified: C80.1

## 2023-12-24 LAB — POCT I-STAT, CHEM 8
BUN: 11 mg/dL (ref 8–23)
Calcium, Ion: 1.3 mmol/L (ref 1.15–1.40)
Chloride: 103 mmol/L (ref 98–111)
Creatinine, Ser: 0.9 mg/dL (ref 0.61–1.24)
Glucose, Bld: 105 mg/dL — ABNORMAL HIGH (ref 70–99)
HCT: 41 % (ref 39.0–52.0)
Hemoglobin: 13.9 g/dL (ref 13.0–17.0)
Potassium: 4.2 mmol/L (ref 3.5–5.1)
Sodium: 140 mmol/L (ref 135–145)
TCO2: 28 mmol/L (ref 22–32)

## 2023-12-24 SURGERY — INSERTION, RADIATION SOURCE, PROSTATE
Anesthesia: General | Site: Prostate

## 2023-12-24 MED ORDER — LIDOCAINE 2% (20 MG/ML) 5 ML SYRINGE
INTRAMUSCULAR | Status: DC | PRN
  Administered 2023-12-24: 80 mg via INTRAVENOUS

## 2023-12-24 MED ORDER — SUGAMMADEX SODIUM 200 MG/2ML IV SOLN
INTRAVENOUS | Status: DC | PRN
  Administered 2023-12-24: 200 mg via INTRAVENOUS

## 2023-12-24 MED ORDER — ACETAMINOPHEN 500 MG PO TABS
ORAL_TABLET | ORAL | Status: AC
  Filled 2023-12-24: qty 2

## 2023-12-24 MED ORDER — PROPOFOL 10 MG/ML IV BOLUS
INTRAVENOUS | Status: AC
  Filled 2023-12-24: qty 20

## 2023-12-24 MED ORDER — ROCURONIUM BROMIDE 10 MG/ML (PF) SYRINGE
PREFILLED_SYRINGE | INTRAVENOUS | Status: DC | PRN
  Administered 2023-12-24: 70 mg via INTRAVENOUS

## 2023-12-24 MED ORDER — ROCURONIUM BROMIDE 10 MG/ML (PF) SYRINGE
PREFILLED_SYRINGE | INTRAVENOUS | Status: AC
  Filled 2023-12-24: qty 10

## 2023-12-24 MED ORDER — DEXMEDETOMIDINE HCL IN NACL 80 MCG/20ML IV SOLN
INTRAVENOUS | Status: DC | PRN
  Administered 2023-12-24: 6 ug via INTRAVENOUS

## 2023-12-24 MED ORDER — SODIUM CHLORIDE 0.9 % IR SOLN
Status: DC | PRN
  Administered 2023-12-24: 1000 mL via INTRAVESICAL

## 2023-12-24 MED ORDER — LACTATED RINGERS IV SOLN: INTRAVENOUS | Status: DC

## 2023-12-24 MED ORDER — DEXAMETHASONE SODIUM PHOSPHATE 10 MG/ML IJ SOLN
INTRAMUSCULAR | Status: AC
Start: 2023-12-24 — End: ?
  Filled 2023-12-24: qty 1

## 2023-12-24 MED ORDER — MIDAZOLAM HCL 5 MG/5ML IJ SOLN
INTRAMUSCULAR | Status: DC | PRN
  Administered 2023-12-24: 2 mg via INTRAVENOUS

## 2023-12-24 MED ORDER — EPHEDRINE SULFATE-NACL 50-0.9 MG/10ML-% IV SOSY
PREFILLED_SYRINGE | INTRAVENOUS | Status: DC | PRN
  Administered 2023-12-24 (×4): 5 mg via INTRAVENOUS

## 2023-12-24 MED ORDER — LIDOCAINE HCL (PF) 2 % IJ SOLN
INTRAMUSCULAR | Status: AC
  Filled 2023-12-24: qty 5

## 2023-12-24 MED ORDER — ACETAMINOPHEN 500 MG PO TABS
1000.0000 mg | ORAL_TABLET | Freq: Once | ORAL | Status: AC
  Administered 2023-12-24: 1000 mg via ORAL

## 2023-12-24 MED ORDER — FENTANYL CITRATE (PF) 100 MCG/2ML IJ SOLN
INTRAMUSCULAR | Status: AC
  Filled 2023-12-24: qty 2

## 2023-12-24 MED ORDER — ONDANSETRON HCL 4 MG/2ML IJ SOLN
INTRAMUSCULAR | Status: DC | PRN
  Administered 2023-12-24: 4 mg via INTRAVENOUS

## 2023-12-24 MED ORDER — DEXAMETHASONE SODIUM PHOSPHATE 10 MG/ML IJ SOLN
INTRAMUSCULAR | Status: DC | PRN
  Administered 2023-12-24: 10 mg via INTRAVENOUS

## 2023-12-24 MED ORDER — STERILE WATER FOR IRRIGATION IR SOLN
Status: DC | PRN
  Administered 2023-12-24: 3 mL

## 2023-12-24 MED ORDER — CEFAZOLIN SODIUM-DEXTROSE 2-4 GM/100ML-% IV SOLN
2.0000 g | Freq: Once | INTRAVENOUS | Status: AC
  Administered 2023-12-24: 2 g via INTRAVENOUS

## 2023-12-24 MED ORDER — SODIUM CHLORIDE (PF) 0.9 % IJ SOLN
INTRAMUSCULAR | Status: DC | PRN
  Administered 2023-12-24: 10 mL

## 2023-12-24 MED ORDER — FENTANYL CITRATE (PF) 100 MCG/2ML IJ SOLN
INTRAMUSCULAR | Status: DC | PRN
  Administered 2023-12-24 (×2): 50 ug via INTRAVENOUS

## 2023-12-24 MED ORDER — MIDAZOLAM HCL 2 MG/2ML IJ SOLN
INTRAMUSCULAR | Status: AC
  Filled 2023-12-24: qty 2

## 2023-12-24 MED ORDER — CEFAZOLIN SODIUM-DEXTROSE 2-4 GM/100ML-% IV SOLN
INTRAVENOUS | Status: AC
Start: 2023-12-24 — End: ?
  Filled 2023-12-24: qty 100

## 2023-12-24 MED ORDER — FLEET ENEMA RE ENEM
1.0000 | ENEMA | Freq: Once | RECTAL | Status: DC
  Filled 2023-12-24: qty 1

## 2023-12-24 MED ORDER — PROPOFOL 10 MG/ML IV BOLUS
INTRAVENOUS | Status: DC | PRN
  Administered 2023-12-24: 150 mg via INTRAVENOUS

## 2023-12-24 MED ORDER — FENTANYL CITRATE (PF) 100 MCG/2ML IJ SOLN: 25.0000 ug | INTRAMUSCULAR | Status: DC | PRN

## 2023-12-24 MED ORDER — IOHEXOL 300 MG/ML  SOLN
INTRAMUSCULAR | Status: DC | PRN
  Administered 2023-12-24: 7 mL

## 2023-12-24 MED ORDER — ONDANSETRON HCL 4 MG/2ML IJ SOLN
INTRAMUSCULAR | Status: AC
  Filled 2023-12-24: qty 2

## 2023-12-24 SURGICAL SUPPLY — 45 items
BAG URINE DRAIN 2000ML AR STRL (UROLOGICAL SUPPLIES) ×1 IMPLANT
BLADE CLIPPER SENSICLIP SURGIC (BLADE) ×1 IMPLANT
BLANKET WARM UPPER BOD BAIR (MISCELLANEOUS) ×1 IMPLANT
Bard QuickLink cartridges with BrachySource 1-125 IMPLANT
CATH FOLEY 2WAY SLVR 5CC 16FR (CATHETERS) ×1 IMPLANT
CATH ROBINSON RED A/P 16FR (CATHETERS) IMPLANT
CATH ROBINSON RED A/P 20FR (CATHETERS) ×1 IMPLANT
CLOTH BEACON ORANGE TIMEOUT ST (SAFETY) ×1 IMPLANT
CNTNR URN SCR LID CUP LEK RST (MISCELLANEOUS) ×1 IMPLANT
COVER BACK TABLE 60X90IN (DRAPES) ×1 IMPLANT
COVER MAYO STAND STRL (DRAPES) ×1 IMPLANT
DRSG TEGADERM 2-3/8X2-3/4 SM (GAUZE/BANDAGES/DRESSINGS) IMPLANT
DRSG TEGADERM 4X4.75 (GAUZE/BANDAGES/DRESSINGS) ×1 IMPLANT
DRSG TEGADERM 8X12 (GAUZE/BANDAGES/DRESSINGS) ×1 IMPLANT
GAUZE SPONGE 4X4 12PLY STRL (GAUZE/BANDAGES/DRESSINGS) IMPLANT
GEL ULTRASOUND 20GR AQUASONIC (MISCELLANEOUS) ×2 IMPLANT
GLOVE BIO SURGEON STRL SZ 6.5 (GLOVE) IMPLANT
GLOVE BIO SURGEON STRL SZ7.5 (GLOVE) IMPLANT
GLOVE BIO SURGEON STRL SZ8 (GLOVE) ×2 IMPLANT
GLOVE BIOGEL PI IND STRL 6.5 (GLOVE) IMPLANT
GLOVE SURG ORTHO 8.5 STRL (GLOVE) ×1 IMPLANT
GLOVE SURG SS PI 7.5 STRL IVOR (GLOVE) IMPLANT
GOWN STRL REUS W/TWL XL LVL3 (GOWN DISPOSABLE) ×1 IMPLANT
GRID BRACH TEMP 18GA 2.8X3X.75 (MISCELLANEOUS) ×1 IMPLANT
HOLDER FOLEY CATH W/STRAP (MISCELLANEOUS) ×1 IMPLANT
IMPL SPACEOAR VUE SYSTEM (Spacer) ×1 IMPLANT
IMPLANT SPACEOAR VUE SYSTEM (Spacer) ×1 IMPLANT
IV NS 1000ML BAXH (IV SOLUTION) ×1 IMPLANT
KIT TURNOVER CYSTO (KITS) ×1 IMPLANT
NDL BRACHY 18G 5PK (NEEDLE) ×4 IMPLANT
NDL BRACHY 18G SINGLE (NEEDLE) IMPLANT
NDL PK MORGANSTERN STABILIZ (NEEDLE) ×1 IMPLANT
NEEDLE BRACHY 18G 5PK (NEEDLE) ×4
NEEDLE BRACHY 18G SINGLE (NEEDLE)
NEEDLE PK MORGANSTERN STABILIZ (NEEDLE) ×1
PACK CYSTO (CUSTOM PROCEDURE TRAY) ×1 IMPLANT
SHEATH ULTRASOUND LF (SHEATH) IMPLANT
SHEATH ULTRASOUND LTX NONSTRL (SHEATH) IMPLANT
SLEEVE SCD COMPRESS KNEE MED (STOCKING) ×1 IMPLANT
SUT BONE WAX W31G (SUTURE) IMPLANT
SYR 10ML LL (SYRINGE) ×1 IMPLANT
SYR CONTROL 10ML LL (SYRINGE) ×1 IMPLANT
TOWEL OR 17X24 6PK STRL BLUE (TOWEL DISPOSABLE) ×1 IMPLANT
UNDERPAD 30X36 HEAVY ABSORB (UNDERPADS AND DIAPERS) ×2 IMPLANT
WATER STERILE IRR 500ML POUR (IV SOLUTION) ×1 IMPLANT

## 2023-12-24 NOTE — Progress Notes (Signed)
  Radiation Oncology         [phone] ________________________________  Name: SENCERE SYMONETTE MRN: [id number]  Date: 12/24/2023  DOB: [birth year]       Prostate Seed Implant  RR:Olxpwh, Glendia LABOR, MD  Matilda Senior, MD  DIAGNOSIS:  73 y.o. gentleman with Stage T1c adenocarcinoma of the prostate with Gleason score of 4+3, and PSA of 5   Oncology History  Malignant neoplasm of prostate (HCC)  09/29/2023 Cancer Staging   Staging form: Prostate, AJCC 8th Edition - Clinical stage from 09/29/2023: Stage IIC (cT1c, cN0, cM0, PSA: 5, Grade Group: 3) - Signed by Sherwood Rise, PA-C on 11/03/2023 Histopathologic type: Adenocarcinoma, NOS Stage prefix: Initial diagnosis Prostate specific antigen (PSA) range: Less than 10 Gleason primary pattern: 4 Gleason secondary pattern: 3 Gleason score: 7 Histologic grading system: 5 grade system Number of biopsy cores examined: 12 Number of biopsy cores positive: 5 Location of positive needle core biopsies: Both sides   11/03/2023 Initial Diagnosis   Malignant neoplasm of prostate (HCC)       ICD-10-CM   1. Essential hypertension, benign  I10 I-Stat, Chem 8 on day of surgery per protocol    I-Stat, Chem 8 on day of surgery per protocol      PROCEDURE: Insertion of radioactive I-125 seeds into the prostate gland.  RADIATION DOSE: 145 Gy, definitive therapy.  TECHNIQUE: ROJELIO UHRICH was brought to the operating room with the urologist. He was placed in the dorsolithotomy position. He was catheterized and a rectal tube was inserted. The perineum was shaved, prepped and draped. The ultrasound probe was then introduced by me into the rectum to see the prostate gland.  TREATMENT DEVICE: I attached the needle grid to the ultrasound probe stand and anchor needles were placed.  3D PLANNING: The prostate was imaged in 3D using a sagittal sweep of the prostate probe. These images were transferred to the planning computer. There, the  prostate, urethra and rectum were defined on each axial reconstructed image. Then, the software created an optimized 3D plan and a few seed positions were adjusted. The quality of the plan was reviewed using [facility] information for the target and the following two organs at risk:  Urethra and Rectum.  Then the accepted plan was printed and handed off to the radiation therapist.  Under my supervision, the custom loading of the seeds and spacers was carried out using the quick loader.  These pre-loaded needles were then placed into the needle holder.SABRA  PROSTATE VOLUME STUDY:  Using transrectal ultrasound the volume of the prostate was verified to be 26.6 cc.  SPECIAL TREATMENT PROCEDURE/SUPERVISION AND HANDLING: The pre-loaded needles were then delivered by the urologist under sagittal guidance. A total of 18 needles were used to deposit 61 seeds in the prostate gland. The individual seed activity was 0.338 mCi.  SpaceOAR:  Yes  COMPLEX SIMULATION: At the end of the procedure, an anterior radiograph of the pelvis was obtained to document seed positioning and count. Cystoscopy was performed by the urologist to check the urethra and bladder.  MICRODOSIMETRY: At the end of the procedure, the patient was emitting 0.052 mR/hr at 1 meter. Accordingly, he was considered safe for hospital discharge.  PLAN: The patient will return to the radiation oncology clinic for post implant CT dosimetry in three weeks.   ________________________________  Donnice FELIX Patrcia, M.D.

## 2023-12-24 NOTE — Interval H&P Note (Signed)
 History and Physical Interval Note:  12/24/2023 12:43 PM  Mark [name]  has presented today for surgery, with the diagnosis of prostate cancer.  The various methods of treatment have been discussed with the patient and family. After consideration of risks, benefits and other options for treatment, the patient has consented to  Procedure(s): RADIOACTIVE SEED IMPLANT/BRACHYTHERAPY IMPLANT (N/A) SPACE OAR INSTILLATION (N/A) as a surgical intervention.  The patient's history has been reviewed, patient examined, no change in status, stable for surgery.  I have reviewed the patient's chart and labs.  Questions were answered to the patient's satisfaction.     Mark [name]

## 2023-12-24 NOTE — Op Note (Signed)
 Preoperative diagnosis: Clinical stage TI C adenocarcinoma the prostate   Postoperative diagnosis: Same   Procedure: I-125 prostate seed implantation, SpaceOAR placement, flexible cystoscopy  Surgeon: Garnette HERO. Sanjana Folz M.D.  Radiation Oncologist: Donnice Barge, M.D.  Anesthesia: Gen.   Indications: Patient  was diagnosed with clinical stage TI C prostate cancer. We had extensive discussion with him about treatment options versus. He elected to proceed with seed implantation. He underwent consultation my office as well as with Dr. Barge. He appeared to understand the advantages disadvantages potential risks of this treatment option. Full informed consent has been obtained.   Technique and findings: Patient was brought the operating room where he had successful induction of general anesthesia. He was placed in dorso-lithotomy position and prepped and draped in usual manner. Appropriate surgical timeout was performed. Radiation oncology department placed a transrectal ultrasound probe anchoring stand. Foley catheter with contrast in the balloon was inserted without difficulty. Anchoring needles were placed within the prostate. Rectal tube was placed. Real-time contouring of the urethra prostate and rectum were performed and the dosing parameters were established. Targeted dose was 145 gray.  I was then called  to the operating suite suite for placement of the needles. A second timeout was performed. All needle passage was done with real-time transrectal ultrasound guidance with the sagittal plane. A total of 18 needles were placed.  61 active seeds were implanted.  I then proceeded with placement of SpaceOAR by introducing a needle with the bevel angled inferiorly approximately 2 cm superior to the anus. This was angled downward and under direct ultrasound was placed within the space between the prostatic capsule and rectum. This was confirmed with a small amount of sterile saline injected and  this was performed under direct ultrasound. I then attached the SpaceOAR to the needle and injected this in the space between the prostate and rectum with good placement noted. The Foley catheter was removed and flexible cystoscopy failed to show any seeds outside the prostate.  The patient was brought to recovery room in stable condition, having tolerated the procedure well.SABRA

## 2023-12-24 NOTE — Transfer of Care (Signed)
 Immediate Anesthesia Transfer of Care Note  Patient: Mark [name]  Procedure(s) Performed: RADIOACTIVE SEED IMPLANT/BRACHYTHERAPY IMPLANT (Prostate) SPACE OAR INSTILLATION (Prostate)  Patient Location: PACU  Anesthesia Type:General  Level of Consciousness: awake, alert , and oriented  Airway & Oxygen Therapy: Patient Spontanous Breathing and Patient connected to nasal cannula oxygen  Post-op Assessment: Report given to RN and Post -op Vital signs reviewed and stable  Post vital signs: Reviewed and stable  Last Vitals:  Vitals Value Taken Time  BP    Temp    Pulse 75 12/24/23 1458  Resp 15 12/24/23 1458  SpO2 99 % 12/24/23 1458  Vitals shown include unfiled device data.  Last Pain:  Vitals:   12/24/23 1216  TempSrc: Oral  PainSc: 4          Complications: No notable events documented.

## 2023-12-24 NOTE — Anesthesia Preprocedure Evaluation (Addendum)
 Anesthesia Evaluation  Patient identified by MRN, date of birth, ID band Patient awake    Reviewed: Allergy & Precautions, H&P , NPO status , Patient's Chart, lab work & pertinent test results, reviewed documented beta blocker date and time   Airway Mallampati: II  TM Distance: >3 FB Neck ROM: Full    Dental no notable dental hx. (+) Teeth Intact, Dental Advisory Given   Pulmonary sleep apnea , former smoker   Pulmonary exam normal breath sounds clear to auscultation       Cardiovascular hypertension, Pt. on medications and Pt. on home beta blockers + CAD and + CABG   Rhythm:Regular Rate:Normal     Neuro/Psych negative neurological ROS  negative psych ROS   GI/Hepatic Neg liver ROS,GERD  Medicated,,  Endo/Other  Hypothyroidism    Renal/GU negative Renal ROS  negative genitourinary   Musculoskeletal   Abdominal   Peds  Hematology negative hematology ROS (+)   Anesthesia Other Findings   Reproductive/Obstetrics negative OB ROS                             Anesthesia Physical Anesthesia Plan  ASA: 3  Anesthesia Plan: General   Post-op Pain Management: Tylenol  PO (pre-op)*   Induction: Intravenous  PONV Risk Score and Plan: 3 and Ondansetron , Dexamethasone  and Treatment may vary due to age or medical condition  Airway Management Planned: LMA and Oral ETT  Additional Equipment:   Intra-op Plan:   Post-operative Plan: Extubation in OR  Informed Consent: I have reviewed the patients History and Physical, chart, labs and discussed the procedure including the risks, benefits and alternatives for the proposed anesthesia with the patient or authorized representative who has indicated his/her understanding and acceptance.     Dental advisory given  Plan Discussed with: CRNA  Anesthesia Plan Comments:        Anesthesia Quick Evaluation

## 2023-12-24 NOTE — Discharge Instructions (Addendum)
Radioactive Seed Implant Home Care Instructions   Activity:    Rest for the remainder of the day.  Do not drive or operate equipment today.  You may resume normal  activities in a few days as instructed by your physician, without risk of harmful radiation exposure to those around you, provided you follow the time and distance precautions on the Radiation Oncology Instruction Sheet.   Meals: Drink plenty of lipuids and eat light foods, such as gelatin or soup this evening .  You may return to normal meal plan tomorrow.  Return To Work: You may return to work as instructed by Designer, multimedia.  Special Instruction:   If any seeds are found, use tweezers to pick up seeds and place in a glass container of any kind and bring to your physician's office.  Call your physician if any of these symptoms occur:   Persistent or heavy bleeding  Urine stream diminishes or stops completely after catheter is removed  Fever equal to or greater than 101 degrees F  Cloudy urine with a strong foul odor  Severe pain  You may feel some burning pain and/or hesitancy when you urinate after the catheter is removed.  These symptoms may increase over the next few weeks, but should diminish within forur to six weeks.  Applying moist heat to the lower abdomen or a hot tub bath may help relieve the pain.  If the discomfort becomes severe, please call your physician for additional medications.   Post Anesthesia Home Care Instructions  Activity: Get plenty of rest for the remainder of the day. A responsible individual must stay with you for 24 hours following the procedure.  For the next 24 hours, DO NOT: -Drive a car -Advertising copywriter -Drink alcoholic beverages -Take any medication unless instructed by your physician -Make any legal decisions or sign important papers.  Meals: Start with liquid foods such as gelatin or soup. Progress to regular foods as tolerated. Avoid greasy, spicy, heavy foods. If nausea  and/or vomiting occur, drink only clear liquids until the nausea and/or vomiting subsides. Call your physician if vomiting continues.  Special Instructions/Symptoms: Your throat may feel dry or sore from the anesthesia or the breathing tube placed in your throat during surgery. If this causes discomfort, gargle with warm salt water. The discomfort should disappear within 24 hours.

## 2023-12-24 NOTE — Anesthesia Procedure Notes (Signed)
 Procedure Name: Intubation Date/Time: 12/24/2023 1:45 PM  Performed by: Reford Olliff D, CRNAPre-anesthesia Checklist: Patient identified, Emergency Drugs available, Suction available and Patient being monitored Patient Re-evaluated:Patient Re-evaluated prior to induction Oxygen Delivery Method: Circle system utilized Preoxygenation: Pre-oxygenation with 100% oxygen Induction Type: IV induction Ventilation: Mask ventilation without difficulty Laryngoscope Size: Mac and 4 Grade View: Grade I Tube type: Oral Number of attempts: 1 Airway Equipment and Method: Stylet and Oral airway Placement Confirmation: ETT inserted through vocal cords under direct vision, positive ETCO2 and breath sounds checked- equal and bilateral Secured at: 22 cm Tube secured with: Tape Dental Injury: Teeth and Oropharynx as per pre-operative assessment

## 2023-12-25 NOTE — Anesthesia Postprocedure Evaluation (Signed)
 Anesthesia Post Note  Patient: Mark [name]  Procedure(s) Performed: RADIOACTIVE SEED IMPLANT/BRACHYTHERAPY IMPLANT (Prostate) SPACE OAR INSTILLATION (Prostate)     Patient location during evaluation: PACU Anesthesia Type: General Level of consciousness: awake and alert Pain management: pain level controlled Vital Signs Assessment: post-procedure vital signs reviewed and stable Respiratory status: spontaneous breathing, nonlabored ventilation and respiratory function stable Cardiovascular status: blood pressure returned to baseline Postop Assessment: no apparent nausea or vomiting Anesthetic complications: no   No notable events documented.  Last Vitals:  Vitals:   12/24/23 1515 12/24/23 1600  BP: (!) 145/85 (!) 134/90  Pulse: 65 64  Resp: (!) 9 14  Temp:    SpO2: 98% 96%    Last Pain:  Vitals:   12/24/23 1530  TempSrc:   PainSc: 0-No pain                 Vertell Row

## 2023-12-28 ENCOUNTER — Encounter (HOSPITAL_BASED_OUTPATIENT_CLINIC_OR_DEPARTMENT_OTHER): Payer: Self-pay | Admitting: Urology

## 2023-12-29 ENCOUNTER — Encounter: Payer: Medicare PPO | Admitting: Urology

## 2023-12-30 DIAGNOSIS — L821 Other seborrheic keratosis: Secondary | ICD-10-CM | POA: Diagnosis not present

## 2023-12-30 DIAGNOSIS — L570 Actinic keratosis: Secondary | ICD-10-CM | POA: Diagnosis not present

## 2023-12-30 DIAGNOSIS — D1801 Hemangioma of skin and subcutaneous tissue: Secondary | ICD-10-CM | POA: Diagnosis not present

## 2023-12-30 DIAGNOSIS — X32XXXA Exposure to sunlight, initial encounter: Secondary | ICD-10-CM | POA: Diagnosis not present

## 2023-12-30 DIAGNOSIS — L814 Other melanin hyperpigmentation: Secondary | ICD-10-CM | POA: Diagnosis not present

## 2023-12-31 NOTE — Progress Notes (Signed)
Patient was a RadOnc Consult on 11/03/23 for his stage T1c adenocarcinoma of the prostate with Gleason score of 4+3, and PSA of 5. Patient proceed with treatment recommendations of brachytherapy and had his treatment on 12/24/23.   Patient is scheduled for a post seed CT Simulation on 2/27 and will follow up with urology on 01/19/24.

## 2024-01-04 ENCOUNTER — Other Ambulatory Visit: Payer: Self-pay

## 2024-01-04 DIAGNOSIS — I723 Aneurysm of iliac artery: Secondary | ICD-10-CM

## 2024-01-08 NOTE — Progress Notes (Deleted)
 Cardiology Office Note:   Date:  01/08/2024  ID:  Mark [name], DOB Jul 11, 1951, MRN [id number] PCP:  Babs Sciara, MD  [facility] Providers Cardiologist:  Alverda Skeans, MD Referring MD: Babs Sciara, MD  Chief Complaint/Reason for Referral: Coronary artery disease follow-up ASSESSMENT:    1. Coronary artery disease involving coronary bypass graft of native heart without angina pectoris   2. Hyperlipidemia LDL goal <70   3. Elevated lipoprotein(a)   4. Aortic atherosclerosis (HCC)   5. Primary hypertension   6. PAD (peripheral artery disease) (HCC)     PLAN:   In order of problems listed above: CAD: Continue aspirin 81 mg daily, rosuvastatin 40 mg daily. Hyperlipidemia: LDL was at 56 in January of this year.  Continue rosuvastatin 40 mg daily. Elevated LP(a): LDL was close to goal of 55 January of this year.  Continue rosuvastatin 40 mg daily. Aortic atherosclerosis: Continue rosuvastatin 40 mg daily, aspirin 81 mg daily, and strict blood pressure control. Hypertension:*** Peripheral arterial disease: Continue aspirin 81 mg a day, Plavix 75 mg daily per Dr. Chestine Spore, and rosuvastatin 40 mg daily.       {Are you ordering a CV Procedure (e.g. stress test, cath, DCCV, TEE, etc)?   Press F2        :[id number]}   Dispo:  No follow-ups on file.      Medication Adjustments/Labs and Tests Ordered: Current medicines are reviewed at length with the patient today.  Concerns regarding medicines are outlined above.  The following changes have been made:  {PLAN; NO CHANGE:13088:s}   Labs/tests ordered: No orders of the defined types were placed in this encounter.   Medication Changes: No orders of the defined types were placed in this encounter.   Current medicines are reviewed at length with the patient today.  The patient {ACTIONS; HAS/DOES NOT HAVE:19233} concerns regarding medicines.  I spent *** minutes reviewing all clinical data during and prior to this visit  including all relevant imaging studies, laboratories, clinical information from other health systems and prior notes from both Cardiology and other specialties, interviewing the patient, conducting a complete physical examination, and coordinating care in order to formulate a comprehensive and personalized evaluation and treatment plan.   History of Present Illness:      FOCUSED PROBLEM LIST:   Coronary artery disease S/p CABG LIMA to LAD in 2006 LHC 06/07/2018: LIMA-LAD patent TTE 06/04/2018: EF 60-65 Peripheral arterial disease S/p coil embolization of R internal iliac artery, stent to right CIA and EIA 11/2023 (Dr. Chestine Spore) Hypertension Hyperlipidemia LP(a) 102 Aortic atherosclerosis CTA abdomen pelvis 2025 CKD stage II OSA Prostate cancer  February 2023:  The patient is a 73 y.o. male with the indicated medical history here for follow-up.  When I saw him last there was a question about whether he was having chest pain or GERD.  He was started on medical therapy including Imdur, nitroglycerin as well as continuing his beta-blocker.   The patient tells me that his chest pain symptoms are much improved.  He is able to walk a mile or 2 every other day without chest pain symptoms.  He has noticed that his sleep has been a little bit more interrupted.  I suggested that he potentially change that when he takes his Imdur.  He did have an episode of chest pain today that took a few minutes to resolve.  He did not take as needed nitroglycerin.  He denies any shortness of breath, presyncope, syncope, palpitations, paroxysmal  nocturnal dyspnea, orthopnea.  He has required no emergency room visits or hospitalizations.  Plan:  Continue medical therapy for stable angina.   2/24: The patient returns for routine follow-up.  In the interim he underwent cervical spine surgery without any cardiovascular issues.  The patient is doing very well.  He denies any exertional angina, exertional dyspnea, presyncope, or  syncope.  He still recovering from cervical spine surgery and is off naproxen because of this.  This is resulted in a lot of different joint aches that naproxen usually controls.  He is hopeful that he will be able to restart this medication in March when he sees the neurosurgeon.  He is otherwise well without cardiovascular complaints.  Plan: Check LP(a), follow-up in 1 year.  2/25: In the interim the patient had a urologic procedure done and his Plavix was held prior to the procedure however he aspirin monotherapy was maintained during the procedure.          Current Medications: No outpatient medications have been marked as taking for the 01/13/24 encounter (Appointment) with Orbie Pyo, MD.     Review of Systems:   Please see the history of present illness.    All other systems reviewed and are negative.     EKGs/Labs/Other Test Reviewed:   EKG:    EKG Interpretation Date/Time:    Ventricular Rate:    PR Interval:    QRS Duration:    QT Interval:    QTC Calculation:   R Axis:      Text Interpretation:           Risk Assessment/Calculations:   {Does this patient have ATRIAL FIBRILLATION?:[id number]}      Physical Exam:   VS:  There were no vitals taken for this visit.   No BP recorded.  {Refresh Note OR Click here to enter BP  :1}***   Wt Readings from Last 3 Encounters:  12/24/23 195 lb (88.5 kg)  12/22/23 200 lb (90.7 kg)  12/14/23 198 lb 6.4 oz (90 kg)      GENERAL:  No apparent distress, AOx3 HEENT:  No carotid bruits, +2 carotid impulses, no scleral icterus CAR: RRR Irregular RR*** no murmurs***, gallops, rubs, or thrills RES:  Clear to auscultation bilaterally ABD:  Soft, nontender, nondistended, positive bowel sounds x 4 VASC:  +2 radial pulses, +2 carotid pulses NEURO:  CN 2-12 grossly intact; motor and sensory grossly intact PSYCH:  No active depression or anxiety EXT:  No edema, ecchymosis, or cyanosis  Signed, Orbie Pyo, MD   01/08/2024 7:39 PM    [facility] [address] [place], [place], [place]  [address] Phone: [phone]; Fax: [phone]   Note:  This document was prepared using Dragon voice recognition software and may include unintentional dictation errors.

## 2024-01-12 ENCOUNTER — Encounter: Payer: Self-pay | Admitting: Family Medicine

## 2024-01-13 ENCOUNTER — Other Ambulatory Visit: Payer: Self-pay | Admitting: Family Medicine

## 2024-01-13 ENCOUNTER — Telehealth: Payer: Self-pay | Admitting: *Deleted

## 2024-01-13 ENCOUNTER — Ambulatory Visit: Payer: Medicare PPO | Admitting: Internal Medicine

## 2024-01-13 DIAGNOSIS — I700 Atherosclerosis of aorta: Secondary | ICD-10-CM

## 2024-01-13 DIAGNOSIS — I25810 Atherosclerosis of coronary artery bypass graft(s) without angina pectoris: Secondary | ICD-10-CM

## 2024-01-13 DIAGNOSIS — E785 Hyperlipidemia, unspecified: Secondary | ICD-10-CM

## 2024-01-13 DIAGNOSIS — I10 Essential (primary) hypertension: Secondary | ICD-10-CM

## 2024-01-13 DIAGNOSIS — I739 Peripheral vascular disease, unspecified: Secondary | ICD-10-CM

## 2024-01-13 DIAGNOSIS — E7841 Elevated Lipoprotein(a): Secondary | ICD-10-CM

## 2024-01-13 MED ORDER — OSELTAMIVIR PHOSPHATE 75 MG PO CAPS: 75.0000 mg | ORAL_CAPSULE | Freq: Two times a day (BID) | ORAL | 0 refills | Status: DC

## 2024-01-13 NOTE — Telephone Encounter (Signed)
 Called patient to remind of post seed appts. for 01-14-24, spoke with patient and he is aware of these appts.

## 2024-01-14 ENCOUNTER — Ambulatory Visit: Payer: Self-pay | Admitting: Urology

## 2024-01-14 ENCOUNTER — Other Ambulatory Visit: Payer: Self-pay | Admitting: Family Medicine

## 2024-01-14 ENCOUNTER — Ambulatory Visit: Payer: Medicare PPO | Admitting: Radiation Oncology

## 2024-01-15 ENCOUNTER — Telehealth: Payer: Self-pay | Admitting: *Deleted

## 2024-01-15 NOTE — Telephone Encounter (Signed)
 CALLED PATIENT TO ASK ABOUT RESCHEDULING POST SEED APPTS., PATIENT AGREED TO COME ON 01-20-24, APPTS. HAVE BEEN SCHEDULED

## 2024-01-18 NOTE — Progress Notes (Unsigned)
 Name: Mark [name] DOB: 1951-10-23 MRN: [id number]  HPI: Mark [name] presents for follow up s/p I-125 prostate seed implantation, SpaceOAR placement, and flexible cystoscopy on 12/24/2023 by Dr. Retta Diones for management of prostate cancer. This procedure was performed in coordination with Dr. Kathrynn Running of Radiation Oncology.   Postop course: He {Actions; denies-reports:120008} increased urinary urgency, frequency, nocturia, dysuria, gross hematuria, hesitancy, straining to void, or sensations of incomplete emptying.  He {Actions; denies-reports:120008} rectal bleeding or pain with defecation.  He {Actions; denies-reports:120008} flank pain or abdominal pain. He {Actions; denies-reports:120008} fevers, nausea, or vomiting.   Fall Screening: Do you usually have a device to assist in your mobility? {yes/no:20286} ***cane / ***walker / ***wheelchair  Medications: Current Outpatient Medications  Medication Sig Dispense Refill   acetaminophen (TYLENOL) 650 MG CR tablet Take 1,300 mg by mouth every 8 (eight) hours as needed for pain.     albuterol (VENTOLIN HFA) 108 (90 Base) MCG/ACT inhaler TAKE 2 PUFFS BY MOUTH EVERY 6 HOURS AS NEEDED FOR WHEEZE OR SHORTNESS OF BREATH 18 each 6   ALPRAZolam (XANAX) 0.25 MG tablet TAKE 1 TABLET (0.25 MG TOTAL) BY MOUTH AT BEDTIME AS NEEDED. 30 tablet 2   aspirin 81 MG tablet Take 1 tablet (81 mg total) by mouth every morning. 30 tablet    augmented betamethasone dipropionate (DIPROLENE-AF) 0.05 % cream Apply 1 Application topically 2 (two) times daily as needed (irritation).     cetirizine (ZYRTEC) 10 MG tablet Take 5 mg by mouth daily as needed for allergies.     clopidogrel (PLAVIX) 75 MG tablet Take 1 tablet (75 mg total) by mouth daily. 30 tablet 11   dextromethorphan-guaiFENesin (MUCINEX DM) 30-600 MG 12hr tablet Take 1 tablet by mouth 2 (two) times daily as needed for cough.     fluticasone (FLONASE) 50 MCG/ACT nasal spray Place 1 spray into  both nostrils daily as needed for allergies or rhinitis.     isosorbide mononitrate (IMDUR) 30 MG 24 hr tablet TAKE 1 TABLET BY MOUTH EVERY DAY 90 tablet 2   levothyroxine (SYNTHROID) 100 MCG tablet TAKE 1 TABLET BY MOUTH EVERY DAY 90 tablet 1   losartan (COZAAR) 50 MG tablet TAKE 1 TABLET BY MOUTH EVERY DAY 90 tablet 1   metoprolol succinate (TOPROL-XL) 25 MG 24 hr tablet Take 0.5 tablets (12.5 mg total) by mouth daily. 45 tablet 1   nitroGLYCERIN (NITROSTAT) 0.4 MG SL tablet Place 0.4 mg under the tongue every 5 (five) minutes as needed for chest pain. NEVER USED     NON FORMULARY Pt uses a cpap nightly (Patient not taking: Reported on 12/22/2023)     omeprazole (PRILOSEC) 20 MG capsule TAKE 1 CAPSULE BY MOUTH EVERY DAY 90 capsule 1   oseltamivir (TAMIFLU) 75 MG capsule Take 1 capsule (75 mg total) by mouth 2 (two) times daily. 10 capsule 0   rosuvastatin (CRESTOR) 40 MG tablet TAKE 1 TABLET BY MOUTH EVERY DAY 90 tablet 1   Saw Palmetto, Serenoa repens, (SAW PALMETTO PO) Take 1 capsule by mouth daily.     tamsulosin (FLOMAX) 0.4 MG CAPS capsule Take 1 capsule (0.4 mg total) by mouth daily. 90 capsule 1   triamcinolone cream (KENALOG) 0.1 % Apply 1 application topically 2 (two) times daily. (Patient taking differently: Apply 1 application  topically 2 (two) times daily as needed (irritation).) 45 g 4   No current facility-administered medications for this visit.    Allergies: Allergies  Allergen Reactions   Mobic [Meloxicam] Rash  Past Medical History:  Diagnosis Date   Abnormal nuclear stress test false positive 05/2018   BPH (benign prostatic hyperplasia) 04/21/2019   Cancer (HCC)    Chest pain stable CAD with cath 06/07/18 possible GI 06/03/2018   Coronary artery disease    a. s/p CABG 2005. b. cath 05/2018 - patent LIMA-LAD, moderate LM and prox OM disease with neg FFR.   Dyslipidemia    Elevated PSA    Esophageal stricture    ESOPHAGEAL STRICTURE 12/18/2008   Qualifier: History  of  By: Candice Camp CMA (AAMA), Dottie     ESOPHAGITIS 12/18/2008   Qualifier: History of  By: Candice Camp CMA (AAMA), Dottie     Essential hypertension, benign 04/06/2013   Gastrointestinal bleeding    GERD (gastroesophageal reflux disease)    History of colonic polyps 08/26/2014   [facility] [place] during colonoscopy in May of 2015. They recommend repeat colonoscopy May of 2017 do to inadequate prep, pathology showed hyperplastic polyp    Hyperlipidemia    Hypothyroidism    Insomnia 04/21/2019   Obstructive sleep apnea 07/11/2013   Has mixed apnea, CPAP setting 7    OSA (obstructive sleep apnea)    RECTAL BLEEDING 12/18/2008   Qualifier: Diagnosis of  By: Candice Camp CMA (AAMA), Dottie     Urinary retention    Vitiligo 07/11/2013   Past Surgical History:  Procedure Laterality Date   ABDOMINAL AORTOGRAM W/LOWER EXTREMITY N/A 11/19/2023   Procedure: ABDOMINAL AORTOGRAM W/LOWER EXTREMITY;  Surgeon: Cephus Shelling, MD;  Location: [facility] INVASIVE CV LAB;  Service: Cardiovascular;  Laterality: N/A;   ANTERIOR CERVICAL DECOMP/DISCECTOMY FUSION N/A 10/29/2022   Procedure: CERVICAL FIVE-SIX ANTERIOR CERVICAL DECOMPRESSION/DISCECTOMY FUSION;  Surgeon: Donalee Citrin, MD;  Location: [facility] OR;  Service: Neurosurgery;  Laterality: N/A;  3C   APPENDECTOMY     bilateral inguinal hernia repair     CARDIAC CATHETERIZATION  12/2005, 06/2005   CHOLECYSTECTOMY N/A 08/04/2022   Procedure: LAPAROSCOPIC CHOLECYSTECTOMY;  Surgeon: Lewie Chamber, DO;  Location: [facility] ORS;  Service: General;  Laterality: N/A;   COLONOSCOPY     CORONARY ARTERY BYPASS GRAFT  11/17/2004   LIMA to LAD, off pump   CORONARY PRESSURE/FFR STUDY N/A 06/07/2018   Procedure: INTRAVASCULAR PRESSURE WIRE/FFR STUDY;  Surgeon: Marykay Lex, MD;  Location: [facility] INVASIVE CV LAB;  Service: Cardiovascular;  Laterality: N/A;   EMBOLIZATION (CATH LAB)  11/19/2023   Procedure: EMBOLIZATION;  Surgeon: Cephus Shelling, MD;  Location: [facility];  Service: Cardiovascular;;   LEFT HEART CATH AND CORS/GRAFTS ANGIOGRAPHY N/A 06/07/2018   Procedure: LEFT HEART CATH AND CORS/GRAFTS ANGIOGRAPHY;  Surgeon: Marykay Lex, MD;  Location: [facility] INVASIVE CV LAB;  Service: Cardiovascular;  Laterality: N/A;   OPEN REDUCTION INTERNAL FIXATION (ORIF) HAND Left 07/21/2015   Procedure: IRRIGATION AND DEBRIDMENT LEFT INDEX AND MIDDLE FINGER, OPEN REDUCTION INTERNAL FIXATION LEFT INDEX FINGER.;  Surgeon: Dominica Severin, MD;  Location: [facility] OR;  Service: Orthopedics;  Laterality: Left;   PERIPHERAL VASCULAR INTERVENTION  11/19/2023   Procedure: PERIPHERAL VASCULAR INTERVENTION;  Surgeon: Cephus Shelling, MD;  Location: [facility];  Service: Cardiovascular;;   PROSTATE BIOPSY     RADIOACTIVE SEED IMPLANT N/A 12/24/2023   Procedure: RADIOACTIVE SEED IMPLANT/BRACHYTHERAPY IMPLANT;  Surgeon: Marcine Matar, MD;  Location: [facility];  Service: Urology;  Laterality: N/A;   SPACE OAR INSTILLATION N/A 12/24/2023   Procedure: SPACE OAR INSTILLATION;  Surgeon: Marcine Matar, MD;  Location: [facility];  Service: Urology;  Laterality: N/A;   Family History  Problem Relation Age of Onset   Heart disease Father    Hypertension Father    Social History   Socioeconomic History   Marital status: Married    Spouse name: Not on file   Number of children: Not on file   Years of education: Not on file   Highest education level: 12th grade  Occupational History   Occupation: Engineer, agricultural: [employer]  Tobacco Use   Smoking status: Former    Current packs/day: 0.00    Average packs/day: 2.0 packs/day for 20.0 years (40.0 ttl pk-yrs)    Types: Cigarettes    Start date: 07/31/1959    Quit date: 07/31/1979    Years since quitting: 44.4   Smokeless tobacco: Former    Types: Chew    Quit date: 07/22/1995  Vaping Use   Vaping status: Never Used  Substance and  Sexual Activity   Alcohol use: Yes    Comment: occassional   Drug use: No   Sexual activity: Yes  Other Topics Concern   Not on file  Social History Narrative   Not on file   Social Drivers of Health   Financial Resource Strain: Low Risk  (12/13/2023)   Overall Financial Resource Strain (CARDIA)    Difficulty of Paying Living Expenses: Not very hard  Food Insecurity: Patient Declined (12/13/2023)   Hunger Vital Sign    Worried About Running Out of Food in the Last Year: Patient declined    Ran Out of Food in the Last Year: Patient declined  Transportation Needs: No Transportation Needs (12/13/2023)   PRAPARE - Administrator, Civil Service (Medical): No    Lack of Transportation (Non-Medical): No  Physical Activity: Insufficiently Active (12/13/2023)   Exercise Vital Sign    Days of Exercise per Week: 3 days    Minutes of Exercise per Session: 30 min  Stress: No Stress Concern Present (12/13/2023)   [employer] of Occupational Health - Occupational Stress Questionnaire    Feeling of Stress : Only a little  Social Connections: Unknown (12/13/2023)   Social Connection and Isolation Panel [NHANES]    Frequency of Communication with Friends and Family: Patient declined    Frequency of Social Gatherings with Friends and Family: Three times a week    Attends Religious Services: Patient declined    Active Member of Clubs or Organizations: Patient declined    Attends Banker Meetings: Not on file    Marital Status: Married  Intimate Partner Violence: Not At Risk (11/03/2023)   Humiliation, Afraid, Rape, and Kick questionnaire    Fear of Current or Ex-Partner: No    Emotionally Abused: No    Physically Abused: No    Sexually Abused: No    SUBJECTIVE  Review of Systems Constitutional: Patient denies any unintentional weight loss or change in strength lntegumentary: Patient denies any rashes or pruritus Cardiovascular: Patient denies chest pain or  syncope Respiratory: Patient denies shortness of breath Gastrointestinal: Patient ***denies nausea, vomiting, constipation, or diarrhea Musculoskeletal: Patient denies muscle cramps or weakness Neurologic: Patient denies convulsions or seizures Allergic/Immunologic: Patient denies recent allergic reaction(s) Hematologic/Lymphatic: Patient denies bleeding tendencies Endocrine: Patient denies heat/cold intolerance  GU: As per HPI.  OBJECTIVE There were no vitals filed for this visit. There is no height or weight on file to calculate BMI.  Physical Examination Constitutional: No obvious distress; patient is non-toxic appearing  Cardiovascular: No visible lower extremity edema.  Respiratory: The patient does not have audible wheezing/stridor; respirations do not appear labored  Gastrointestinal: Abdomen non-distended Musculoskeletal: Normal ROM of UEs  Skin: No obvious rashes/open sores  Neurologic: CN 2-12 grossly intact Psychiatric: Answered questions appropriately with normal affect  Hematologic/Lymphatic/Immunologic: No obvious bruises or sites of spontaneous bleeding  GU: Catheter draining ***clear yellow urine.  ASSESSMENT No diagnosis found.  ***We reviewed the operative procedure and outcome. He is doing ***well.   *** Passed voiding trial. Foley catheter to remain out.  *** Voiding trial impaired by bladder spasms. Foley catheter to remain out; patient instructed to return this afternoon for PVR check to ensure adequate bladder emptying.   *** Failed voiding trial. Foley catheter ***replaced.  *** Patient declined replacement of Foley catheter and instead elected to start clean intermittent catheterization (CIC) ***x/day. CIC teaching performed by nursing staff; supplied provided / ordered.   ***Will plan for follow up with repeat voiding trial again in ***2 weeks.   Discussed plan to follow up with Dr. Ronne Binning at 3 months postop with PSA check prior. Patient was  instructed to follow up with his oncology team as advised.  Pt verbalized understanding and agreement. All questions were answered.  PLAN Advised the following: 1. Foley catheter ***discontinued. 2. Follow up with oncology team as scheduled. 3. ***No follow-ups on file.  *** Billing: 90 day global period for brachytherapy***  No orders of the defined types were placed in this encounter.   It has been explained that the patient is to follow regularly with their PCP in addition to all other providers involved in their care and to follow instructions provided by these respective offices. Patient advised to contact urology clinic if any urologic-pertaining questions, concerns, new symptoms or problems arise in the interim period.  There are no Patient Instructions on file for this visit.  Electronically signed by:  Donnita Falls, MSN, FNP-C, CUNP 01/18/2024 3:45 PM

## 2024-01-19 ENCOUNTER — Encounter: Payer: Self-pay | Admitting: Urology

## 2024-01-19 ENCOUNTER — Telehealth: Payer: Self-pay | Admitting: *Deleted

## 2024-01-19 ENCOUNTER — Ambulatory Visit (INDEPENDENT_AMBULATORY_CARE_PROVIDER_SITE_OTHER): Payer: Medicare PPO | Admitting: Urology

## 2024-01-19 VITALS — BP 132/77 | HR 62 | Temp 98.1°F

## 2024-01-19 DIAGNOSIS — Z09 Encounter for follow-up examination after completed treatment for conditions other than malignant neoplasm: Secondary | ICD-10-CM | POA: Diagnosis not present

## 2024-01-19 DIAGNOSIS — R351 Nocturia: Secondary | ICD-10-CM

## 2024-01-19 DIAGNOSIS — C61 Malignant neoplasm of prostate: Secondary | ICD-10-CM

## 2024-01-19 DIAGNOSIS — R339 Retention of urine, unspecified: Secondary | ICD-10-CM

## 2024-01-19 DIAGNOSIS — N401 Enlarged prostate with lower urinary tract symptoms: Secondary | ICD-10-CM

## 2024-01-19 DIAGNOSIS — N320 Bladder-neck obstruction: Secondary | ICD-10-CM

## 2024-01-19 LAB — BLADDER SCAN AMB NON-IMAGING: Scan Result: 267

## 2024-01-19 MED ORDER — TAMSULOSIN HCL 0.4 MG PO CAPS: 0.4000 mg | ORAL_CAPSULE | Freq: Two times a day (BID) | ORAL | 1 refills | Status: DC

## 2024-01-19 NOTE — Telephone Encounter (Signed)
 CALLED PATIENT TO REMIND OF POST SEED APPTS. FOR 01-20-24, SPOKE WITH PATIENT AND HE IS AWARE OF THESE APPT.

## 2024-01-19 NOTE — Progress Notes (Addendum)
 Bladder Scan completed today.  Patient can void prior to the bladder scan. Bladder scan result: 483  Second PVR 267  Performed By: Kennyth Lose, CMA  Additional notes-

## 2024-01-20 ENCOUNTER — Ambulatory Visit
Admission: RE | Admit: 2024-01-20 | Discharge: 2024-01-20 | Disposition: A | Discharge status: Home / Self Care | Payer: Self-pay | Source: Ambulatory Visit | Attending: Urology | Admitting: Urology

## 2024-01-20 ENCOUNTER — Ambulatory Visit
Admission: RE | Admit: 2024-01-20 | Discharge: 2024-01-20 | Disposition: A | Discharge status: Home / Self Care | Payer: Medicare PPO | Source: Ambulatory Visit | Attending: Radiation Oncology | Admitting: Radiation Oncology

## 2024-01-20 ENCOUNTER — Encounter: Payer: Self-pay | Admitting: Urology

## 2024-01-20 VITALS — BP 118/77 | HR 65 | Temp 97.6°F | Resp 18 | Ht 71.0 in | Wt 192.0 lb

## 2024-01-20 DIAGNOSIS — C61 Malignant neoplasm of prostate: Secondary | ICD-10-CM | POA: Insufficient documentation

## 2024-01-20 DIAGNOSIS — Z7982 Long term (current) use of aspirin: Secondary | ICD-10-CM | POA: Insufficient documentation

## 2024-01-20 DIAGNOSIS — Z79890 Hormone replacement therapy (postmenopausal): Secondary | ICD-10-CM | POA: Insufficient documentation

## 2024-01-20 DIAGNOSIS — Z923 Personal history of irradiation: Secondary | ICD-10-CM | POA: Insufficient documentation

## 2024-01-20 DIAGNOSIS — Z79899 Other long term (current) drug therapy: Secondary | ICD-10-CM | POA: Insufficient documentation

## 2024-01-20 LAB — URINALYSIS, ROUTINE W REFLEX MICROSCOPIC
Bilirubin, UA: NEGATIVE
Glucose, UA: NEGATIVE
Ketones, UA: NEGATIVE
Leukocytes,UA: NEGATIVE
Nitrite, UA: NEGATIVE
Protein,UA: NEGATIVE
RBC, UA: NEGATIVE
Specific Gravity, UA: <1.005 — ABNORMAL LOW (ref 1.005–1.030)
Urobilinogen, Ur: 0.2 mg/dL (ref 0.2–1.0)
pH, UA: 6.5 (ref 5.0–7.5)

## 2024-01-20 NOTE — Progress Notes (Signed)
 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  Patient identity verified x2.   Patient reports dysuria 5/10 and moderate ED. Patient denies all other related issues at this time.  Meaningful use complete.  I-PSS score- 13 - Moderate SHIM score- 10 Urinary Management medication(s) Tamsulosin Urology appointment date- 3 months with Dr. Retta Diones at [facility]  Vitals- BP 118/77 (BP Location: Left Arm, Patient Position: Sitting, Cuff Size: Large)   Pulse 65   Temp 97.6 F (36.4 C) (Temporal)   Resp 18   Ht 5\' 11"  (1.803 m)   Wt 192 lb (87.1 kg)   SpO2 97%   BMI 26.78 kg/m   This concludes the interaction.  Ruel Favors, LPN

## 2024-01-20 NOTE — Progress Notes (Signed)
  Radiation Oncology         [phone]) [phone] ________________________________  Name: Mark [name] MRN: [id number]  Date: 01/20/2024  DOB: Aug 07, 1951  COMPLEX SIMULATION NOTE  NARRATIVE:  The patient was brought to the CT Simulation planning suite today following prostate seed implantation approximately one month ago.  Identity was confirmed.  All relevant records and images related to the planned course of therapy were reviewed.  Then, the patient was set-up supine.  CT images were obtained.  The CT images were loaded into the planning software.  Then the prostate and rectum were contoured.  Treatment planning then occurred.  The implanted iodine 125 seeds were identified by the physics staff for projection of radiation distribution  I have requested : 3D Simulation  I have requested a DVH of the following structures: Prostate and rectum.    ________________________________  Artist Pais Kathrynn Running, M.D.

## 2024-01-20 NOTE — Progress Notes (Signed)
 Radiation Oncology         [phone]) [phone] ________________________________  Name: Mark [name] MRN: [id number]  Date: 01/20/2024  DOB: Feb 09, 1951  Post-Seed Follow-Up Visit Note  CC: Babs Sciara, MD  Marcine Matar, MD  Diagnosis:   73 y.o. gentleman with Stage T1c adenocarcinoma of the prostate with Gleason score of 4+3, and PSA of 5     ICD-10-CM   1. Malignant neoplasm of prostate (HCC)  C61       Interval Since Last Radiation:  1 month 12/24/23:  Insertion of radioactive I-125 seeds into the prostate gland; 145 Gy, definitive therapy with placement of SpaceOAR gel.  Narrative:  The patient returns today for routine follow-up.  He is complaining of increased urinary frequency and urinary hesitation symptoms. He filled out a questionnaire regarding urinary function today providing and overall IPSS score of 13 characterizing his symptoms as moderate with intermittency, hesitancy, dysuria at start of stream and nocturia x3. He has recently increased the Flomax to BID which should help manage the LUTS while we await the resolution of the prostate inflammation.  His pre-implant score was 10. He denies any abdominal pain or bowel symptoms. His stamina is down a little but overall, he is quite pleased with his progress to date.  ALLERGIES:  is allergic to mobic [meloxicam].  Meds: Current Outpatient Medications  Medication Sig Dispense Refill   acetaminophen (TYLENOL) 650 MG CR tablet Take 1,300 mg by mouth every 8 (eight) hours as needed for pain.     albuterol (VENTOLIN HFA) 108 (90 Base) MCG/ACT inhaler TAKE 2 PUFFS BY MOUTH EVERY 6 HOURS AS NEEDED FOR WHEEZE OR SHORTNESS OF BREATH 18 each 6   ALPRAZolam (XANAX) 0.25 MG tablet TAKE 1 TABLET (0.25 MG TOTAL) BY MOUTH AT BEDTIME AS NEEDED. 30 tablet 2   aspirin 81 MG tablet Take 1 tablet (81 mg total) by mouth every morning. 30 tablet    augmented betamethasone dipropionate (DIPROLENE-AF) 0.05 % cream Apply 1 Application  topically 2 (two) times daily as needed (irritation).     cetirizine (ZYRTEC) 10 MG tablet Take 5 mg by mouth daily as needed for allergies.     dextromethorphan-guaiFENesin (MUCINEX DM) 30-600 MG 12hr tablet Take 1 tablet by mouth 2 (two) times daily as needed for cough.     fluticasone (FLONASE) 50 MCG/ACT nasal spray Place 1 spray into both nostrils daily as needed for allergies or rhinitis.     isosorbide mononitrate (IMDUR) 30 MG 24 hr tablet TAKE 1 TABLET BY MOUTH EVERY DAY 90 tablet 2   levothyroxine (SYNTHROID) 100 MCG tablet TAKE 1 TABLET BY MOUTH EVERY DAY 90 tablet 1   losartan (COZAAR) 50 MG tablet TAKE 1 TABLET BY MOUTH EVERY DAY 90 tablet 1   metoprolol succinate (TOPROL-XL) 25 MG 24 hr tablet Take 0.5 tablets (12.5 mg total) by mouth daily. 45 tablet 1   nitroGLYCERIN (NITROSTAT) 0.4 MG SL tablet Place 0.4 mg under the tongue every 5 (five) minutes as needed for chest pain. NEVER USED     NON FORMULARY Pt uses a cpap nightly     omeprazole (PRILOSEC) 20 MG capsule TAKE 1 CAPSULE BY MOUTH EVERY DAY 90 capsule 1   oseltamivir (TAMIFLU) 75 MG capsule Take 1 capsule (75 mg total) by mouth 2 (two) times daily. 10 capsule 0   rosuvastatin (CRESTOR) 40 MG tablet TAKE 1 TABLET BY MOUTH EVERY DAY 90 tablet 1   Saw Palmetto, Serenoa repens, (SAW PALMETTO PO) Take  1 capsule by mouth daily.     tamsulosin (FLOMAX) 0.4 MG CAPS capsule Take 1 capsule (0.4 mg total) by mouth in the morning and at bedtime. 180 capsule 1   triamcinolone cream (KENALOG) 0.1 % Apply 1 application topically 2 (two) times daily. (Patient taking differently: Apply 1 application  topically 2 (two) times daily as needed (irritation).) 45 g 4   No current facility-administered medications for this encounter.    Physical Findings: In general this is a well appearing Caucasian male in no acute distress. He's alert and oriented x4 and appropriate throughout the examination. Cardiopulmonary assessment is negative for acute  distress and he exhibits normal effort.   Lab Findings: Lab Results  Component Value Date   WBC 4.6 06/10/2023   HGB 13.9 12/24/2023   HCT 41.0 12/24/2023   MCV 93 06/10/2023   PLT 164 06/10/2023    Radiographic Findings:  Patient underwent CT imaging in our clinic for post implant dosimetry. The CT will be reviewed by Dr. Kathrynn Running to confirm there is an adequate distribution of radioactive seeds throughout the prostate gland and ensure that there are no seeds in or near the rectum.  We suspect the final radiation plan and dosimetry will show appropriate coverage of the prostate gland. He understands that we will call and inform him of any unexpected findings on further review of his imaging and dosimetry.  Impression/Plan: 73 y.o. gentleman with Stage T1c adenocarcinoma of the prostate with Gleason score of 4+3, and PSA of 5  The patient is recovering from the effects of radiation. His urinary symptoms should gradually improve over the next 4-6 months. We talked about this today and he has recently increased the Flomax dose to BID to help manage the LUTS. He is encouraged by his improvement already and is otherwise pleased with his outcome. We also talked about long-term follow-up for prostate cancer following seed implant. He understands that ongoing PSA determinations and digital rectal exams will help perform surveillance to rule out disease recurrence. He has a follow up appointment scheduled at the [facility] office on 02/04/24 and then will see Dr. Retta Diones on 04/19/24 following his initial post-treatment PSA. He understands what to expect with his PSA measures. Patient was also educated today about some of the long-term effects from radiation including a small risk for rectal bleeding and possibly erectile dysfunction. We talked about some of the general management approaches to these potential complications. However, I did encourage the patient to contact our office or return at any  point if he has questions or concerns related to his previous radiation and prostate cancer.    Marguarite Arbour, PA-C

## 2024-01-29 ENCOUNTER — Encounter: Payer: Self-pay | Admitting: Radiation Oncology

## 2024-01-29 DIAGNOSIS — C61 Malignant neoplasm of prostate: Secondary | ICD-10-CM | POA: Diagnosis not present

## 2024-01-29 DIAGNOSIS — Z191 Hormone sensitive malignancy status: Secondary | ICD-10-CM | POA: Diagnosis not present

## 2024-01-29 NOTE — Radiation Completion Notes (Signed)
 Patient Name: Mark [name], Mark [name] MRN: [id number] Date of Birth: Dec 29, 1950 Referring Physician: Marcine Matar, M.D. Date of Service: 2024-01-29 Radiation Oncologist: Margaretmary Bayley, M.D. [facility]                             RADIATION ONCOLOGY END OF TREATMENT NOTE     Diagnosis: C61 Malignant neoplasm of prostate Staging on 2023-09-29: Malignant neoplasm of prostate (HCC) T=cT1c, N=cN0, M=cM0 Intent: Curative     ==========DELIVERED PLANS==========  Prostate Seed Implant Date: 2023-12-24   Plan Name: Prostate Seed Implant Site: Prostate Technique: Radioactive Seed Implant I-125 Mode: Brachytherapy Dose Per Fraction: 145 Gy Prescribed Dose (Delivered / Prescribed): 145 Gy / 145 Gy Prescribed Fxs (Delivered / Prescribed): 1 / 1     ==========ON TREATMENT VISIT DATES========== 2023-12-24     ==========UPCOMING VISITS========== 04/19/2024 [facility] [place] OFFICE VISIT Marcine Matar, MD  03/14/2024 [facility] OFFICE OFFICE VISIT Orbie Pyo, MD  02/04/2024 [facility] [place] NURSE VISIT AUR-NURSE

## 2024-02-04 ENCOUNTER — Ambulatory Visit

## 2024-02-04 DIAGNOSIS — R339 Retention of urine, unspecified: Secondary | ICD-10-CM | POA: Diagnosis not present

## 2024-02-04 NOTE — Telephone Encounter (Signed)
 Spoke with patient in regards to request for a new order for cpap supplies, he states he had a sleep study done years ago via [facility]   Copied from CRM [id number]. Topic: Clinical - Prescription Issue >> Feb 03, 2024 12:35 PM Priscille Loveless wrote: Reason for CRM:   Pt is wanting to change to [facility] for cpap supplies.   [facility]  Fax number [phone]   Pt is needing supplies reordered now and wants to use [facility]

## 2024-02-04 NOTE — Progress Notes (Signed)
 Bladder Scan completed today.  Patient can void prior to the bladder scan. Bladder scan result: 254  Performed By: [facility] LPN

## 2024-02-04 NOTE — Telephone Encounter (Signed)
 1.  Please go ahead with writing an order for CPAP supplies 2.  Please try to find out from the patient how long ago did he have sleep study approximately More than likely the new supplier will want to have a copy of that In some situations if the result has not been within the past several years they will request a new sleep study Once the prescription is written I will sign off on it thank you

## 2024-02-05 NOTE — Progress Notes (Signed)
  Radiation Oncology         [phone]) [phone] ________________________________  Name: Mark [name] MRN: [id number]  Date: 01/29/2024  DOB: 10/17/1951  3D Planning Note   Prostate Brachytherapy Post-Implant Dosimetry  Diagnosis: 73 y.o. gentleman with Stage T1c adenocarcinoma of the prostate with Gleason score of 4+3, and PSA of 5   Narrative: On a previous date, Mark [name] returned following prostate seed implantation for post implant planning. He underwent CT scan complex simulation to delineate the three-dimensional structures of the pelvis and demonstrate the radiation distribution.  Since that time, the seed localization, and complex isodose planning with dose volume histograms have now been completed.  Results:   Prostate Coverage - The dose of radiation delivered to the 90% or more of the prostate gland (D90) was 113.03% of the prescription dose. This exceeds our goal of greater than 90%. Rectal Sparing - The volume of rectal tissue receiving the prescription dose or higher was 0.0 cc. This falls under our thresholds tolerance of 1.0 cc.  Impression: The prostate seed implant appears to show adequate target coverage and appropriate rectal sparing.  Plan:  The patient will continue to follow with urology for ongoing PSA determinations. I would anticipate a high likelihood for local tumor control with minimal risk for rectal morbidity.  ________________________________  Artist Pais Kathrynn Running, M.D.

## 2024-02-06 ENCOUNTER — Other Ambulatory Visit: Payer: Self-pay | Admitting: Internal Medicine

## 2024-02-06 ENCOUNTER — Other Ambulatory Visit: Payer: Self-pay | Admitting: Family Medicine

## 2024-02-07 ENCOUNTER — Other Ambulatory Visit: Payer: Self-pay | Admitting: Family Medicine

## 2024-02-07 DIAGNOSIS — R339 Retention of urine, unspecified: Secondary | ICD-10-CM

## 2024-02-07 DIAGNOSIS — Z09 Encounter for follow-up examination after completed treatment for conditions other than malignant neoplasm: Secondary | ICD-10-CM

## 2024-02-07 DIAGNOSIS — C61 Malignant neoplasm of prostate: Secondary | ICD-10-CM

## 2024-02-07 DIAGNOSIS — N401 Enlarged prostate with lower urinary tract symptoms: Secondary | ICD-10-CM

## 2024-02-07 DIAGNOSIS — N320 Bladder-neck obstruction: Secondary | ICD-10-CM

## 2024-02-08 ENCOUNTER — Other Ambulatory Visit: Payer: Self-pay

## 2024-02-08 ENCOUNTER — Encounter: Payer: Self-pay | Admitting: *Deleted

## 2024-02-08 DIAGNOSIS — Z09 Encounter for follow-up examination after completed treatment for conditions other than malignant neoplasm: Secondary | ICD-10-CM

## 2024-02-08 DIAGNOSIS — C61 Malignant neoplasm of prostate: Secondary | ICD-10-CM

## 2024-02-08 DIAGNOSIS — R339 Retention of urine, unspecified: Secondary | ICD-10-CM

## 2024-02-08 DIAGNOSIS — N320 Bladder-neck obstruction: Secondary | ICD-10-CM

## 2024-02-08 DIAGNOSIS — N401 Enlarged prostate with lower urinary tract symptoms: Secondary | ICD-10-CM

## 2024-02-08 MED ORDER — TAMSULOSIN HCL 0.4 MG PO CAPS: 0.4000 mg | ORAL_CAPSULE | Freq: Two times a day (BID) | ORAL | 1 refills | Status: DC

## 2024-02-12 ENCOUNTER — Other Ambulatory Visit: Payer: Self-pay

## 2024-02-12 MED ORDER — ISOSORBIDE MONONITRATE ER 30 MG PO TB24: 30.0000 mg | ORAL_TABLET | Freq: Every day | ORAL | 0 refills | Status: DC

## 2024-02-12 MED ORDER — ROSUVASTATIN CALCIUM 40 MG PO TABS: 40.0000 mg | ORAL_TABLET | Freq: Every day | ORAL | 0 refills | Status: DC

## 2024-02-15 ENCOUNTER — Other Ambulatory Visit: Payer: Self-pay | Admitting: Urology

## 2024-02-15 DIAGNOSIS — C61 Malignant neoplasm of prostate: Secondary | ICD-10-CM

## 2024-02-15 DIAGNOSIS — N320 Bladder-neck obstruction: Secondary | ICD-10-CM

## 2024-02-15 DIAGNOSIS — N401 Enlarged prostate with lower urinary tract symptoms: Secondary | ICD-10-CM

## 2024-02-15 DIAGNOSIS — Z09 Encounter for follow-up examination after completed treatment for conditions other than malignant neoplasm: Secondary | ICD-10-CM

## 2024-02-15 DIAGNOSIS — R339 Retention of urine, unspecified: Secondary | ICD-10-CM

## 2024-02-15 MED ORDER — TAMSULOSIN HCL 0.4 MG PO CAPS: 0.4000 mg | ORAL_CAPSULE | Freq: Two times a day (BID) | ORAL | 3 refills | Status: AC

## 2024-02-16 ENCOUNTER — Encounter: Payer: Self-pay | Admitting: *Deleted

## 2024-02-16 NOTE — Progress Notes (Signed)
 Completed and mailed SCP summary to pt for upcoming SCP visit.

## 2024-02-19 ENCOUNTER — Telehealth: Payer: Self-pay | Admitting: *Deleted

## 2024-02-19 NOTE — Telephone Encounter (Signed)
 Copied from CRM [id number]. Topic: Clinical - Prescription Issue >> Feb 19, 2024  3:44 PM Ivette P wrote: Reason for CRM: Natasha Mead called in to notify that pt called in to get the following prescriptions changed to new Pharmacy:  ALPRAZolam (XANAX) 0.25 MG tablet metoprolol succinate (TOPROL-XL) 25 MG 24 hr tablet omeprazole (PRILOSEC) 20 MG capsule losartan (COZAAR) 50 MG tablet levothyroxine (SYNTHROID) 100 MCG   With [employer] Order   [id number]

## 2024-02-21 NOTE — Telephone Encounter (Signed)
 So over the past several weeks I have received several printed from [facility] On those I have basically instructed the nurses they can send in the electronics for these BSN to [facility] and forwarded these papers be on my office But when it comes to alprazolam I do not recommend getting that through mail order because sometimes controlled medications can get lost  It would be fine to send in the long term medications 90-day with 3 refills But as for the alprazolam I would prefer that that be filled locally If that cannot be done I will send it to [facility] but only 30 days at a time

## 2024-03-08 ENCOUNTER — Ambulatory Visit: Admitting: *Deleted

## 2024-03-08 ENCOUNTER — Encounter: Payer: Self-pay | Admitting: *Deleted

## 2024-03-08 NOTE — Progress Notes (Signed)
 SCP reviewed and completed. Allergies and medications reviewed and updated. Pt denies smoking and drinks on special occasions. Vaccines reviewed and updated. Last colonoscopy was 09/17/2021.  Pt sees a dermatologist for annual skin checks. Nutrition and exercise were discussed and the importance of both as we move forward  to a healthier lifestyle. Pt is walking 3/4-1 mile every other day, and doing yard work weekly.Pt will visit Dr. Joie Narrow in June. A copy of tx summary from today's visit has been sent to PCP.

## 2024-03-09 NOTE — Progress Notes (Signed)
 Cardiology Office Note:   Date:  03/14/2024  ID:  NOYAN Mark [name], DOB 08-15-1951, MRN [id number] PCP:  Mark Brasil, MD  [facility] Providers Cardiologist:  Alyssa Backbone, MD Referring MD: Mark Brasil, MD  Chief Complaint/Reason for Referral: Follow-up for coronary artery disease ASSESSMENT:    1. Angina pectoris (HCC)   2. Hyperlipidemia LDL goal <55   3. Elevated lipoprotein(a)   4. Aortic atherosclerosis (HCC)   5. Primary hypertension   6. Peripheral vascular disease, unspecified (HCC)   7. CKD (chronic kidney disease) stage 2, GFR 60-89 ml/min     PLAN:   In order of problems listed above: CAD with stable angina: Continue aspirin  81 mg, Imdur  30 mg, Toprol -XL 12.5 mg and rosuvastatin  40 mg.  On as needed nitroglycerin . Hyperlipidemia: Given presence of peripheral vascular disease patient's goal LDL is less than 55.  Lipid panel recently was close to goal.  Continue rosuvastatin  40 mg daily Elevated LP(a): Continue rosuvastatin  40 mg, lipid panel was close to 55. Aortic atherosclerosis: Continue aspirin  81 mg, rosuvastatin  40 mg, and strict blood pressure control. Hypertension: Increase to losartan  100 mg, Toprol  12.5 mg.  BMP in one week.  Goal blood pressure 130/80; patient has a follow-up appoint with his PCP within the next month. Peripheral vascular disease: Continue aspirin  81 mg, rosuvastatin  40 mg, and strict blood pressure control.  The patient is followed by Dr. Fulton [name]. CKD stage II: Continue losartan  50 mg for renal protection.            Dispo:  Return in about 6 months (around 09/13/2024).      Medication Adjustments/Labs and Tests Ordered: Current medicines are reviewed at length with the patient today.  Concerns regarding medicines are outlined above.  The following changes have been made:     Labs/tests ordered: Orders Placed This Encounter  Procedures   Basic metabolic panel with GFR    Medication Changes: Meds ordered this  encounter  Medications   losartan  (COZAAR ) 100 MG tablet    Sig: Take 1 tablet (100 mg total) by mouth daily.    Dispense:  90 tablet    Refill:  3    Current medicines are reviewed at length with the patient today.  The patient does not have concerns regarding medicines.  I spent 35 minutes reviewing all clinical data during and prior to this visit including all relevant imaging studies, laboratories, clinical information from other health systems and prior notes from both Cardiology and other specialties, interviewing the patient, conducting a complete physical examination, and coordinating care in order to formulate a comprehensive and personalized evaluation and treatment plan.   History of Present Illness:      FOCUSED PROBLEM LIST:   Coronary artery disease S/p CABG LIMA to LAD 2006 LHC 06/07/2018: LIMA-LAD patent TTE 06/04/2018: EF 60-65 Peripheral arterial disease S/p coil embolization of R internal iliac artery, stent to right CIA and EIA 11/2023 (Dr. Fulton [name]) Hypertension Hyperlipidemia LP(a) 101.9 Aortic atherosclerosis CT abdomen pelvis 2025 CKD stage II OSA  February 2023:  The patient is a 73 y.o. male with the indicated medical history here for follow-up.  When I saw him last there was a question about whether he was having chest pain or GERD.  He was started on medical therapy including Imdur , nitroglycerin  as well as continuing his beta-blocker.   The patient tells me that his chest pain symptoms are much improved.  He is able to walk a mile or 2 every other  day without chest pain symptoms.  He has noticed that his sleep has been a little bit more interrupted.  I suggested that he potentially change that when he takes his Imdur .  He did have an episode of chest pain today that took a few minutes to resolve.  He did not take as needed nitroglycerin .  He denies any shortness of breath, presyncope, syncope, palpitations, paroxysmal nocturnal dyspnea, orthopnea.  He has  required no emergency room visits or hospitalizations.  Plan:  Continue medical therapy for stable angina.   February 2024: The patient returns for routine follow-up.  In the interim he underwent cervical spine surgery without any cardiovascular issues.  The patient is doing very well.  He denies any exertional angina, exertional dyspnea, presyncope, or syncope.  He still recovering from cervical spine surgery and is off naproxen  because of this.  This is resulted in a lot of different joint aches that naproxen  usually controls.  He is hopeful that he will be able to restart this medication in March when he sees the neurosurgeon.  He is otherwise well without cardiovascular complaints.  Plan: Check LP(a).  April 2025:  Patient consents to use of AI scribe. The patient's LP(a) was elevated.  His atorvastatin  was converted to Crestor  40 mg.  Repeat lipid panel demonstrated an LDL of 56.  She had undergone coil embolization and stenting of his right common and external iliac arteries.  He had a telephone visit with Marlyse Single in January for preoperative assessment prior to urologic procedure.  He was doing well at that point in time.  He experiences occasional morning pain described as 'bad indigestion,' persisting for over six months. This pain sometimes resolves with Pepto-Bismol or spontaneously. He has been on stomach acid medication for over twenty years, initiated after his gallbladder removal.  His blood pressure was noted to be elevated during this visit, although it was previously around 120/70 during his last several checks. He does not regularly monitor his blood pressure at home unless he suspects it might be high. He acknowledges that his blood pressure can rise during holidays due to increased sodium intake.  He is doing everything that he needs to do in a day including chopping wood without any exertional angina or exertional dyspnea.        Current Medications: Current Meds   Medication Sig   acetaminophen  (TYLENOL ) 650 MG CR tablet Take 1,300 mg by mouth every 8 (eight) hours as needed for pain.   albuterol  (VENTOLIN  HFA) 108 (90 Base) MCG/ACT inhaler TAKE 2 PUFFS BY MOUTH EVERY 6 HOURS AS NEEDED FOR WHEEZE OR SHORTNESS OF BREATH   ALPRAZolam  (XANAX ) 0.25 MG tablet TAKE 1 TABLET (0.25 MG TOTAL) BY MOUTH AT BEDTIME AS NEEDED.   aspirin  81 MG tablet Take 1 tablet (81 mg total) by mouth every morning.   augmented betamethasone dipropionate (DIPROLENE-AF) 0.05 % cream Apply 1 Application topically 2 (two) times daily as needed (irritation).   cetirizine (ZYRTEC) 10 MG tablet Take 5 mg by mouth daily as needed for allergies.   fluticasone (FLONASE) 50 MCG/ACT nasal spray Place 1 spray into both nostrils daily as needed for allergies or rhinitis.   isosorbide  mononitrate (IMDUR ) 30 MG 24 hr tablet Take 1 tablet (30 mg total) by mouth daily.   levothyroxine  (SYNTHROID ) 100 MCG tablet TAKE 1 TABLET BY MOUTH EVERY DAY   losartan  (COZAAR ) 100 MG tablet Take 1 tablet (100 mg total) by mouth daily.   metoprolol  succinate (TOPROL -XL) 25 MG  24 hr tablet TAKE 1/2 TABLET BY MOUTH EVERY DAY   nitroGLYCERIN  (NITROSTAT ) 0.4 MG SL tablet Place 0.4 mg under the tongue every 5 (five) minutes as needed for chest pain. NEVER USED   NON FORMULARY Pt uses a cpap nightly   omeprazole  (PRILOSEC) 20 MG capsule TAKE 1 CAPSULE BY MOUTH EVERY DAY   psyllium (METAMUCIL) 58.6 % powder Take 1 packet by mouth daily.  Pt takes every evening.   rosuvastatin  (CRESTOR ) 40 MG tablet Take 1 tablet (40 mg total) by mouth daily.   Saw Palmetto , Serenoa repens, (SAW PALMETTO  PO) Take 1 capsule by mouth daily.   tamsulosin  (FLOMAX ) 0.4 MG CAPS capsule Take 1 capsule (0.4 mg total) by mouth in the morning and at bedtime.   triamcinolone  cream (KENALOG ) 0.1 % Apply 1 application topically 2 (two) times daily.   [DISCONTINUED] losartan  (COZAAR ) 50 MG tablet TAKE 1 TABLET BY MOUTH EVERY DAY     Review of  Systems:   Please see the history of present illness.    All other systems reviewed and are negative.     EKGs/Labs/Other Test Reviewed:   EKG: January 2025 sinus rhythm  EKG Interpretation Date/Time:    Ventricular Rate:    PR Interval:    QRS Duration:    QT Interval:    QTC Calculation:   R Axis:      Text Interpretation:           Risk Assessment/Calculations:          Physical Exam:   VS:  BP (!) 140/80   Pulse (!) 59   Ht 5\' 11"  (1.803 m)   Wt 197 lb (89.4 kg)   SpO2 95%   BMI 27.48 kg/m    HYPERTENSION CONTROL Vitals:   03/14/24 0836 03/14/24 0854  BP: (!) 140/86 (!) 140/80    The patient's blood pressure is elevated above target today.  In order to address the patient's elevated BP: A current anti-hypertensive medication was adjusted today.      Wt Readings from Last 3 Encounters:  03/14/24 197 lb (89.4 kg)  01/20/24 192 lb (87.1 kg)  12/24/23 195 lb (88.5 kg)      GENERAL:  No apparent distress, AOx3 HEENT:  No carotid bruits, +2 carotid impulses, no scleral icterus CAR: RRR no murmurs, gallops, rubs, or thrills RES:  Clear to auscultation bilaterally ABD:  Soft, nontender, nondistended, positive bowel sounds x 4 VASC:  +2 radial pulses, +2 carotid pulses NEURO:  CN 2-12 grossly intact; motor and sensory grossly intact PSYCH:  No active depression or anxiety EXT:  No edema, ecchymosis, or cyanosis  Signed, Livingston Denner K Tyaira Heward, MD  03/14/2024 9:21 AM    [facility] [address] [place], [place], [place]  [address] Phone: [phone]; Fax: [phone]   Note:  This document was prepared using Dragon voice recognition software and may include unintentional dictation errors.

## 2024-03-14 ENCOUNTER — Ambulatory Visit: Payer: Medicare PPO | Attending: Internal Medicine | Admitting: Internal Medicine

## 2024-03-14 ENCOUNTER — Encounter: Payer: Self-pay | Admitting: Internal Medicine

## 2024-03-14 ENCOUNTER — Encounter: Payer: Self-pay | Admitting: *Deleted

## 2024-03-14 VITALS — BP 140/80 | HR 59 | Ht 71.0 in | Wt 197.0 lb

## 2024-03-14 DIAGNOSIS — I10 Essential (primary) hypertension: Secondary | ICD-10-CM | POA: Diagnosis not present

## 2024-03-14 DIAGNOSIS — E785 Hyperlipidemia, unspecified: Secondary | ICD-10-CM | POA: Diagnosis not present

## 2024-03-14 DIAGNOSIS — I700 Atherosclerosis of aorta: Secondary | ICD-10-CM | POA: Diagnosis not present

## 2024-03-14 DIAGNOSIS — I209 Angina pectoris, unspecified: Secondary | ICD-10-CM | POA: Diagnosis not present

## 2024-03-14 DIAGNOSIS — N182 Chronic kidney disease, stage 2 (mild): Secondary | ICD-10-CM | POA: Diagnosis not present

## 2024-03-14 DIAGNOSIS — I739 Peripheral vascular disease, unspecified: Secondary | ICD-10-CM | POA: Diagnosis not present

## 2024-03-14 DIAGNOSIS — E7841 Elevated Lipoprotein(a): Secondary | ICD-10-CM | POA: Diagnosis not present

## 2024-03-14 MED ORDER — LOSARTAN POTASSIUM 100 MG PO TABS: 100.0000 mg | ORAL_TABLET | Freq: Every day | ORAL | 3 refills | Status: AC

## 2024-03-14 MED ORDER — LOSARTAN POTASSIUM 100 MG PO TABS
100.0000 mg | ORAL_TABLET | Freq: Every day | ORAL | 3 refills | Status: DC
Start: 2024-03-14 — End: 2024-03-14

## 2024-03-14 NOTE — Patient Instructions (Signed)
 Medication Instructions:  Your physician has recommended you make the following change in your medication:   1) INCREASE losartan  100 mg daily *If you need a refill on your cardiac medications before your next appointment, please call your pharmacy*  Lab Work: Next week: BMP If you have labs (blood work) drawn today and your tests are completely normal, you will receive your results only by: MyChart Message (if you have MyChart) OR A paper copy in the mail If you have any lab test that is abnormal or we need to change your treatment, we will call you to review the results.  Follow-Up: At [facility], you and your health needs are our priority.  As part of our continuing mission to provide you with exceptional heart care, our providers are all part of one team.  This team includes your primary Cardiologist (physician) and Advanced Practice Providers or APPs (Physician Assistants and Nurse Practitioners) who all work together to provide you with the care you need, when you need it.  Your next appointment:   6 month(s)  Provider:   Marlyse Single, PA-C

## 2024-03-14 NOTE — Addendum Note (Signed)
 Addended by: Luwana Salvo on: 03/14/2024 10:26 AM   Modules accepted: Orders

## 2024-03-16 ENCOUNTER — Other Ambulatory Visit: Payer: Self-pay | Admitting: Family Medicine

## 2024-03-16 MED ORDER — ALPRAZOLAM 0.25 MG PO TABS: 0.2500 mg | ORAL_TABLET | Freq: Every evening | ORAL | 4 refills | Status: DC | PRN

## 2024-03-17 DIAGNOSIS — I10 Essential (primary) hypertension: Secondary | ICD-10-CM | POA: Diagnosis not present

## 2024-03-17 LAB — BASIC METABOLIC PANEL WITH GFR
BUN/Creatinine Ratio: 15 (ref 10–24)
BUN: 14 mg/dL (ref 8–27)
CO2: 20 mmol/L (ref 20–29)
Calcium: 9.1 mg/dL (ref 8.6–10.2)
Chloride: 105 mmol/L (ref 96–106)
Creatinine, Ser: 0.96 mg/dL (ref 0.76–1.27)
Glucose: 113 mg/dL — ABNORMAL HIGH (ref 70–99)
Potassium: 4.1 mmol/L (ref 3.5–5.2)
Sodium: 138 mmol/L (ref 134–144)
eGFR: 83 mL/min/{1.73_m2} (ref >59)

## 2024-03-18 ENCOUNTER — Encounter: Payer: Self-pay | Admitting: Internal Medicine

## 2024-03-22 ENCOUNTER — Other Ambulatory Visit: Payer: Self-pay | Admitting: Family Medicine

## 2024-03-22 MED ORDER — METOPROLOL SUCCINATE ER 25 MG PO TB24: 12.5000 mg | ORAL_TABLET | Freq: Every day | ORAL | 1 refills | Status: DC

## 2024-03-22 NOTE — Telephone Encounter (Signed)
 Last Fill: 02/08/24  Last OV: 12/14/23 Next OV: 06/14/24  Routing to provider for review/authorization.

## 2024-03-22 NOTE — Telephone Encounter (Signed)
 Copied from CRM [id number]. Topic: Clinical - Medication Refill >> Mar 22, 2024  3:21 PM Alyse July wrote: Most Recent Primary Care Visit:  Provider: Charlotta Cook A  Department: [facility]  Visit Type: OFFICE VISIT  Date: 12/14/2023  Medication: metoprolol  succinate (TOPROL -XL) 25 MG 24 hr tablet  Has the patient contacted their pharmacy? Yes (Agent: If no, request that the patient contact the pharmacy for the refill. If patient does not wish to contact the pharmacy document the reason why and proceed with request.) (Agent: If yes, when and what did the pharmacy advise?)  Is this the correct pharmacy for this prescription? Yes If no, delete pharmacy and type the correct one.  This is the patient's preferred pharmacy:   [facility] - [place], [place] - [address] [place] [address] Phone: [phone] Fax: [phone]   Has the prescription been filled recently? No  Is the patient out of the medication? No  Has the patient been seen for an appointment in the last year OR does the patient have an upcoming appointment? Yes  Can we respond through MyChart? Yes  Agent: Please be advised that Rx refills may take up to 3 business days. We ask that you follow-up with your pharmacy.

## 2024-04-01 DIAGNOSIS — G4733 Obstructive sleep apnea (adult) (pediatric): Secondary | ICD-10-CM | POA: Diagnosis not present

## 2024-04-13 DIAGNOSIS — H4321 Crystalline deposits in vitreous body, right eye: Secondary | ICD-10-CM | POA: Diagnosis not present

## 2024-04-13 DIAGNOSIS — H43812 Vitreous degeneration, left eye: Secondary | ICD-10-CM | POA: Diagnosis not present

## 2024-04-13 DIAGNOSIS — H2513 Age-related nuclear cataract, bilateral: Secondary | ICD-10-CM | POA: Diagnosis not present

## 2024-04-13 DIAGNOSIS — H25013 Cortical age-related cataract, bilateral: Secondary | ICD-10-CM | POA: Diagnosis not present

## 2024-04-18 NOTE — Progress Notes (Signed)
 History of Present Illness: This gentleman comes in today for discussion of prostate cancer, recently diagnosed.  Transrectal ultrasound and biopsy was performed on 11.12.2024.  Prostate volume was 25 mL, PSA 5, PSAD 0.20.  5/12 cores revealed adenocarcinoma- -4 cores (right base lateral, right base medial, right mid lateral, right mid medial) revealed GS 4+3 pattern -1 core 4 (left apex medial) revealed GS 3+4 pattern  11.14.2024 CT abdomen and pelvis- 1. Enhancing nodular focus along the right posterior aspect of the prostate gland measuring 11 mm, compatible with known prostate cancer. 2. No convincing evidence of metastatic disease in the abdomen or pelvis. 3. 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 Suggest vascular surgery consultation. 4. Subcentimeter hypodensity in the right lobe of the liver measuring 6 mm, nonspecific but favored to reflect a benign lesion such as a cyst or hemangioma. 5. Nonobstructive bilateral renal stones measure up to 7 mm. 6.  Aortic Atherosclerosis  2.6.2025: Underwent I 125 brachytherapy, Space OAR placement.  6.3.2025: Here today for routine check.  He is having moderate lower urinary tract symptoms despite being on tamsulosin  twice a day.  Minimal dizziness.  IPSS 22.  He has had no blood in his urine or stone. Past Medical History:  Diagnosis Date   Abnormal nuclear stress test false positive 05/2018   BPH (benign prostatic hyperplasia) 04/21/2019   Cancer (HCC)    Chest pain stable CAD with cath 06/07/18 possible GI 06/03/2018   Coronary artery disease    a. s/p CABG 2005. b. cath 05/2018 - patent LIMA-LAD, moderate LM and prox OM disease with neg FFR.   Dyslipidemia    Elevated PSA    Esophageal stricture    ESOPHAGEAL STRICTURE 12/18/2008   Qualifier: History of  By: Misty Amour CMA (AAMA), Dottie     ESOPHAGITIS 12/18/2008   Qualifier: History of  By: Misty Amour CMA  (AAMA), Dottie     Essential hypertension, benign 04/06/2013   Gastrointestinal bleeding    GERD (gastroesophageal reflux disease)    History of colonic polyps 08/26/2014   [facility]  during colonoscopy in May of 2015. They recommend repeat colonoscopy May of 2017 do to inadequate prep, pathology showed hyperplastic polyp    Hyperlipidemia    Hypothyroidism    Insomnia 04/21/2019   Obstructive sleep apnea 07/11/2013   Has mixed apnea, CPAP setting 7    OSA (obstructive sleep apnea)    RECTAL BLEEDING 12/18/2008   Qualifier: Diagnosis of  By: Misty Amour CMA (AAMA), Dottie     Urinary retention    Vitiligo 07/11/2013    Past Surgical History:  Procedure Laterality Date   ABDOMINAL AORTOGRAM W/LOWER EXTREMITY N/A 11/19/2023   Procedure: ABDOMINAL AORTOGRAM W/LOWER EXTREMITY;  Surgeon: Young Hensen, MD;  Location: [facility] INVASIVE CV LAB;  Service: Cardiovascular;  Laterality: N/A;   ANTERIOR CERVICAL DECOMP/DISCECTOMY FUSION N/A 10/29/2022   Procedure: CERVICAL FIVE-SIX ANTERIOR CERVICAL DECOMPRESSION/DISCECTOMY FUSION;  Surgeon: Gearl Keens, MD;  Location: [facility] OR;  Service: Neurosurgery;  Laterality: N/A;  3C   APPENDECTOMY     bilateral inguinal hernia repair     CARDIAC CATHETERIZATION  12/2005, 06/2005   CHOLECYSTECTOMY N/A 08/04/2022   Procedure: LAPAROSCOPIC CHOLECYSTECTOMY;  Surgeon: Marijo Shove, DO;  Location: [facility] ORS;  Service: General;  Laterality: N/A;   COLONOSCOPY     CORONARY ARTERY BYPASS GRAFT  11/17/2004   LIMA to LAD, off pump   CORONARY PRESSURE/FFR STUDY N/A 06/07/2018  Procedure: INTRAVASCULAR PRESSURE WIRE/FFR STUDY;  Surgeon: Arleen Lacer, MD;  Location: [facility] INVASIVE CV LAB;  Service: Cardiovascular;  Laterality: N/A;   EMBOLIZATION (CATH LAB)  11/19/2023   Procedure: EMBOLIZATION;  Surgeon: Young Hensen, MD;  Location: [facility];  Service: Cardiovascular;;   LEFT HEART CATH AND CORS/GRAFTS  ANGIOGRAPHY N/A 06/07/2018   Procedure: LEFT HEART CATH AND CORS/GRAFTS ANGIOGRAPHY;  Surgeon: Arleen Lacer, MD;  Location: [facility] INVASIVE CV LAB;  Service: Cardiovascular;  Laterality: N/A;   OPEN REDUCTION INTERNAL FIXATION (ORIF) HAND Left 07/21/2015   Procedure: IRRIGATION AND DEBRIDMENT LEFT INDEX AND MIDDLE FINGER, OPEN REDUCTION INTERNAL FIXATION LEFT INDEX FINGER.;  Surgeon: Ronn Cohn, MD;  Location: [facility] OR;  Service: Orthopedics;  Laterality: Left;   PERIPHERAL VASCULAR INTERVENTION  11/19/2023   Procedure: PERIPHERAL VASCULAR INTERVENTION;  Surgeon: Young Hensen, MD;  Location: [facility];  Service: Cardiovascular;;   PROSTATE BIOPSY     RADIOACTIVE SEED IMPLANT N/A 12/24/2023   Procedure: RADIOACTIVE SEED IMPLANT/BRACHYTHERAPY IMPLANT;  Surgeon: Trent Frizzle, MD;  Location: [facility];  Service: Urology;  Laterality: N/A;   SPACE OAR INSTILLATION N/A 12/24/2023   Procedure: SPACE OAR INSTILLATION;  Surgeon: Trent Frizzle, MD;  Location: [facility];  Service: Urology;  Laterality: N/A;    Home Medications:  Allergies as of 04/19/2024       Reactions   Mobic [meloxicam] Rash        Medication List        Accurate as of April 18, 2024  8:06 AM. If you have any questions, ask your nurse or doctor.          acetaminophen  650 MG CR tablet Commonly known as: TYLENOL  Take 1,300 mg by mouth every 8 (eight) hours as needed for pain.   albuterol  108 (90 Base) MCG/ACT inhaler Commonly known as: VENTOLIN  HFA TAKE 2 PUFFS BY MOUTH EVERY 6 HOURS AS NEEDED FOR WHEEZE OR SHORTNESS OF BREATH   ALPRAZolam  0.25 MG tablet Commonly known as: XANAX  Take 1 tablet (0.25 mg total) by mouth at bedtime as needed.   aspirin  81 MG tablet Take 1 tablet (81 mg total) by mouth every morning.   augmented betamethasone dipropionate 0.05 % cream Commonly known as: DIPROLENE-AF Apply 1 Application topically 2 (two) times daily as needed  (irritation).   cetirizine 10 MG tablet Commonly known as: ZYRTEC Take 5 mg by mouth daily as needed for allergies.   fluticasone 50 MCG/ACT nasal spray Commonly known as: FLONASE Place 1 spray into both nostrils daily as needed for allergies or rhinitis.   isosorbide  mononitrate 30 MG 24 hr tablet Commonly known as: IMDUR  Take 1 tablet (30 mg total) by mouth daily.   levothyroxine  100 MCG tablet Commonly known as: SYNTHROID  TAKE 1 TABLET BY MOUTH EVERY DAY   losartan  100 MG tablet Commonly known as: COZAAR  Take 1 tablet (100 mg total) by mouth daily.   metoprolol  succinate 25 MG 24 hr tablet Commonly known as: TOPROL -XL Take 0.5 tablets (12.5 mg total) by mouth daily.   nitroGLYCERIN  0.4 MG SL tablet Commonly known as: NITROSTAT  Place 0.4 mg under the tongue every 5 (five) minutes as needed for chest pain. NEVER USED   NON FORMULARY Pt uses a cpap nightly   omeprazole  20 MG capsule Commonly known as: PRILOSEC TAKE 1 CAPSULE BY MOUTH EVERY DAY   psyllium 58.6 % powder Commonly known as: METAMUCIL Take 1 packet by mouth daily.  Pt  takes every evening.   rosuvastatin  40 MG tablet Commonly known as: CRESTOR  Take 1 tablet (40 mg total) by mouth daily.   SAW PALMETTO  PO Take 1 capsule by mouth daily.   tamsulosin  0.4 MG Caps capsule Commonly known as: FLOMAX  Take 1 capsule (0.4 mg total) by mouth in the morning and at bedtime.   triamcinolone  cream 0.1 % Commonly known as: KENALOG  Apply 1 application topically 2 (two) times daily.        Allergies:  Allergies  Allergen Reactions   Mobic [Meloxicam] Rash    Family History  Problem Relation Age of Onset   Heart disease Father    Hypertension Father     Social History:  reports that he quit smoking about 44 years ago. His smoking use included cigarettes. He started smoking about 64 years ago. He has a 40 pack-year smoking history. He quit smokeless tobacco use about 28 years ago.  His smokeless tobacco  use included chew. He reports current alcohol use. He reports that he does not use drugs.  ROS: A complete review of systems was performed.  All systems are negative except for pertinent findings as noted.  Physical Exam:  Vital signs in last 24 hours: There were no vitals taken for this visit. Constitutional:  Alert and oriented, No acute distress Cardiovascular: Regular rate  Respiratory: Normal respiratory effort Neurologic: Grossly intact, no focal deficits Psychiatric: Normal mood and affect  I have reviewed prior pt notes  I have reviewed urinalysis results  I have independently reviewed prior imaging--prostate ultrasound  I have reviewed prior PSA and pathology results  Residual urine volume today 298 mL   Impression/Assessment:  -Grade group 3 prostate cancer, status post 5 brachytherapy/SpaceOAR on 12/24/2023.  No PSA determinations yet  - Lower urinary tract symptoms, worsened since his brachytherapy  Plan: -He will continue his tamsulosin  twice a day.  I did counsel him that spicy foods, as includes, caffeine may make his lower urinary tract symptoms worse  -I will see him back in a couple of months.  If still having significant symptoms then an elevated residual urine volume, consider cystoscopy and eventual intervention for his incomplete emptying

## 2024-04-19 ENCOUNTER — Other Ambulatory Visit: Payer: Self-pay | Admitting: Family Medicine

## 2024-04-19 ENCOUNTER — Ambulatory Visit: Admitting: Urology

## 2024-04-19 ENCOUNTER — Telehealth: Payer: Self-pay | Admitting: Family Medicine

## 2024-04-19 ENCOUNTER — Encounter: Payer: Self-pay | Admitting: Urology

## 2024-04-19 VITALS — BP 111/61 | HR 59

## 2024-04-19 DIAGNOSIS — C61 Malignant neoplasm of prostate: Secondary | ICD-10-CM | POA: Diagnosis not present

## 2024-04-19 LAB — URINALYSIS, ROUTINE W REFLEX MICROSCOPIC
Bilirubin, UA: NEGATIVE
Glucose, UA: NEGATIVE
Ketones, UA: NEGATIVE
Leukocytes,UA: NEGATIVE
Nitrite, UA: NEGATIVE
Protein,UA: NEGATIVE
RBC, UA: NEGATIVE
Specific Gravity, UA: <1.005 — ABNORMAL LOW (ref 1.005–1.030)
Urobilinogen, Ur: 0.2 mg/dL (ref 0.2–1.0)
pH, UA: 6 (ref 5.0–7.5)

## 2024-04-19 MED ORDER — OMEPRAZOLE 20 MG PO CPDR: 20.0000 mg | DELAYED_RELEASE_CAPSULE | Freq: Every day | ORAL | 1 refills | Status: DC

## 2024-04-19 NOTE — Telephone Encounter (Unsigned)
 Copied from CRM [id number]. Topic: Clinical - Medication Refill >> Apr 19, 2024 12:23 PM Rosaria Common wrote: Medication: omeprazole  (PRILOSEC) 20 MG capsule  Has the patient contacted their pharmacy? Yes (Agent: If no, request that the patient contact the pharmacy for the refill. If patient does not wish to contact the pharmacy document the reason why and proceed with request.) (Agent: If yes, when and what did the pharmacy advise?)  This is the patient's preferred pharmacy:    [facility] - [place], [place] - [address] [place] [address] Phone: [phone] Fax: [phone]  Is this the correct pharmacy for this prescription? Yes If no, delete pharmacy and type the correct one.   Has the prescription been filled recently? No  Is the patient out of the medication? No  Has the patient been seen for an appointment in the last year OR does the patient have an upcoming appointment? Yes  Can we respond through MyChart? Yes  Agent: Please be advised that Rx refills may take up to 3 business days. We ask that you follow-up with your pharmacy.

## 2024-04-19 NOTE — Telephone Encounter (Signed)
 Refill on    omeprazole  (PRILOSEC) 20 MG capsule  [facility] pharmacy

## 2024-04-19 NOTE — Telephone Encounter (Signed)
 Prescription has been sent to mail in pharmacy

## 2024-04-20 ENCOUNTER — Ambulatory Visit: Payer: Self-pay | Admitting: Urology

## 2024-04-20 LAB — PSA: Prostate Specific Ag, Serum: 1.5 ng/mL (ref 0.0–4.0)

## 2024-05-25 ENCOUNTER — Other Ambulatory Visit: Payer: Self-pay | Admitting: Internal Medicine

## 2024-05-29 ENCOUNTER — Encounter: Payer: Self-pay | Admitting: Family Medicine

## 2024-05-30 NOTE — Telephone Encounter (Signed)
 Please order lipid, liver, metabolic 7, A1c, TSH and free T4  Diagnosis hyperlipidemia, hypertension, hypothyroidism, high risk med  I did not order PSA this is followed by his specialist

## 2024-05-31 ENCOUNTER — Other Ambulatory Visit: Payer: Self-pay

## 2024-05-31 DIAGNOSIS — R7303 Prediabetes: Secondary | ICD-10-CM

## 2024-05-31 DIAGNOSIS — E785 Hyperlipidemia, unspecified: Secondary | ICD-10-CM

## 2024-05-31 DIAGNOSIS — Z79899 Other long term (current) drug therapy: Secondary | ICD-10-CM

## 2024-05-31 DIAGNOSIS — I10 Essential (primary) hypertension: Secondary | ICD-10-CM

## 2024-05-31 DIAGNOSIS — E039 Hypothyroidism, unspecified: Secondary | ICD-10-CM

## 2024-06-06 DIAGNOSIS — R7303 Prediabetes: Secondary | ICD-10-CM | POA: Diagnosis not present

## 2024-06-06 DIAGNOSIS — E785 Hyperlipidemia, unspecified: Secondary | ICD-10-CM | POA: Diagnosis not present

## 2024-06-06 DIAGNOSIS — I10 Essential (primary) hypertension: Secondary | ICD-10-CM | POA: Diagnosis not present

## 2024-06-06 DIAGNOSIS — E039 Hypothyroidism, unspecified: Secondary | ICD-10-CM | POA: Diagnosis not present

## 2024-06-06 DIAGNOSIS — Z79899 Other long term (current) drug therapy: Secondary | ICD-10-CM | POA: Diagnosis not present

## 2024-06-07 ENCOUNTER — Ambulatory Visit: Payer: Self-pay | Admitting: Family Medicine

## 2024-06-07 LAB — HEPATIC FUNCTION PANEL
ALT: 13 IU/L (ref 0–44)
AST: 20 IU/L (ref 0–40)
Albumin: 4.2 g/dL (ref 3.8–4.8)
Alkaline Phosphatase: 46 IU/L (ref 44–121)
Bilirubin Total: 1.3 mg/dL — ABNORMAL HIGH (ref 0.0–1.2)
Bilirubin, Direct: 0.35 mg/dL (ref 0.00–0.40)
Total Protein: 6.3 g/dL (ref 6.0–8.5)

## 2024-06-07 LAB — HEMOGLOBIN A1C
Est. average glucose Bld gHb Est-mCnc: 120 mg/dL
Hgb A1c MFr Bld: 5.8 % — ABNORMAL HIGH (ref 4.8–5.6)

## 2024-06-07 LAB — BASIC METABOLIC PANEL WITH GFR
BUN/Creatinine Ratio: 15 (ref 10–24)
BUN: 15 mg/dL (ref 8–27)
CO2: 20 mmol/L (ref 20–29)
Calcium: 9.1 mg/dL (ref 8.6–10.2)
Chloride: 106 mmol/L (ref 96–106)
Creatinine, Ser: 0.97 mg/dL (ref 0.76–1.27)
Glucose: 119 mg/dL — ABNORMAL HIGH (ref 70–99)
Potassium: 4.4 mmol/L (ref 3.5–5.2)
Sodium: 138 mmol/L (ref 134–144)
eGFR: 82 mL/min/1.73 (ref >59)

## 2024-06-07 LAB — LIPID PANEL
Chol/HDL Ratio: 3.5 ratio (ref 0.0–5.0)
Cholesterol, Total: 134 mg/dL (ref 100–199)
HDL: 38 mg/dL — ABNORMAL LOW (ref >39)
LDL Chol Calc (NIH): 52 mg/dL (ref 0–99)
Triglycerides: 280 mg/dL — ABNORMAL HIGH (ref 0–149)
VLDL Cholesterol Cal: 44 mg/dL — ABNORMAL HIGH (ref 5–40)

## 2024-06-07 LAB — TSH+FREE T4
Free T4: 1.54 ng/dL (ref 0.82–1.77)
TSH: 2.25 u[IU]/mL (ref 0.450–4.500)

## 2024-06-14 ENCOUNTER — Ambulatory Visit (INDEPENDENT_AMBULATORY_CARE_PROVIDER_SITE_OTHER): Payer: Medicare PPO | Admitting: Family Medicine

## 2024-06-14 ENCOUNTER — Encounter: Payer: Self-pay | Admitting: Family Medicine

## 2024-06-14 VITALS — BP 90/61 | HR 70 | Temp 97.2°F | Ht 71.0 in | Wt 202.0 lb

## 2024-06-14 DIAGNOSIS — I10 Essential (primary) hypertension: Secondary | ICD-10-CM | POA: Diagnosis not present

## 2024-06-14 DIAGNOSIS — M25551 Pain in right hip: Secondary | ICD-10-CM | POA: Diagnosis not present

## 2024-06-14 DIAGNOSIS — E785 Hyperlipidemia, unspecified: Secondary | ICD-10-CM | POA: Diagnosis not present

## 2024-06-14 DIAGNOSIS — Z0001 Encounter for general adult medical examination with abnormal findings: Secondary | ICD-10-CM | POA: Diagnosis not present

## 2024-06-14 DIAGNOSIS — Z0000 Encounter for general adult medical examination without abnormal findings: Secondary | ICD-10-CM

## 2024-06-14 DIAGNOSIS — E039 Hypothyroidism, unspecified: Secondary | ICD-10-CM

## 2024-06-14 DIAGNOSIS — C61 Malignant neoplasm of prostate: Secondary | ICD-10-CM

## 2024-06-14 DIAGNOSIS — R7303 Prediabetes: Secondary | ICD-10-CM

## 2024-06-14 NOTE — Progress Notes (Signed)
 Subjective:    Patient ID: Mark [name], male    DOB: 11-16-1951, 73 y.o.   MRN: [id number]  HPI The patient comes in today for a wellness visit.  Cognitively patient doing well no short-term memory issues Immunizations reviewed Preventative health reviewed Chronic health illnesses are stable Refills were updated Labs were reviewed Patient has PSA followed by specialist Results for orders placed or performed in visit on 05/31/24  TSH + free T4   Collection Time: 06/06/24  8:31 AM  Result Value Ref Range   TSH 2.250 0.450 - 4.500 uIU/mL   Free T4 1.54 0.82 - 1.77 ng/dL  Hemoglobin J8r   Collection Time: 06/06/24  8:31 AM  Result Value Ref Range   Hgb A1c MFr Bld 5.8 (H) 4.8 - 5.6 %   Est. average glucose Bld gHb Est-mCnc 120 mg/dL  Basic Metabolic Panel   Collection Time: 06/06/24  8:31 AM  Result Value Ref Range   Glucose 119 (H) 70 - 99 mg/dL   BUN 15 8 - 27 mg/dL   Creatinine, Ser 9.02 0.76 - 1.27 mg/dL   eGFR 82 >40 fO/fpw/8.26   BUN/Creatinine Ratio 15 10 - 24   Sodium 138 134 - 144 mmol/L   Potassium 4.4 3.5 - 5.2 mmol/L   Chloride 106 96 - 106 mmol/L   CO2 20 20 - 29 mmol/L   Calcium  9.1 8.6 - 10.2 mg/dL  Hepatic Function Panel   Collection Time: 06/06/24  8:31 AM  Result Value Ref Range   Total Protein 6.3 6.0 - 8.5 g/dL   Albumin 4.2 3.8 - 4.8 g/dL   Bilirubin Total 1.3 (H) 0.0 - 1.2 mg/dL   Bilirubin, Direct 9.64 0.00 - 0.40 mg/dL   Alkaline Phosphatase 46 44 - 121 IU/L   AST 20 0 - 40 IU/L   ALT 13 0 - 44 IU/L  Lipid Panel   Collection Time: 06/06/24  8:31 AM  Result Value Ref Range   Cholesterol, Total 134 100 - 199 mg/dL   Triglycerides 719 (H) 0 - 149 mg/dL   HDL 38 (L) >60 mg/dL   VLDL Cholesterol Cal 44 (H) 5 - 40 mg/dL   LDL Chol Calc (NIH) 52 0 - 99 mg/dL   Chol/HDL Ratio 3.5 0.0 - 5.0 ratio   LDL in good range Liver enzymes look good Kidney functions look good, A1c looks good  A review of their health history was completed.  A  review of medications was also completed.  Any needed refills; none needed  Eating habits:  regular diet   Falls/  MVA accidents in past few months: none  Regular exercise: yes  Specialist pt sees on regular basis: yes    Preventative health issues were discussed.   Additional concerns: overall health concerns   Discussed the use of AI scribe software for clinical note transcription with the patient, who gave verbal consent to proceed.  History of Present Illness   Mark [name] is a 73 year old male with prostate cancer who presents for follow-up of urinary symptoms and hip pain.  He experiences urinary symptoms following prostate seed implantation. Initially, he had burning sensations, but his bowel movements and urination have returned to normal. He notes variability in urine flow, sometimes more than before, and reports nocturia, needing to urinate three to four times per night. He is currently taking tamsulosin . He mentions urgency and occasional difficulty holding urine until reaching the bathroom.  He experiences right hip pain, particularly  after an aneurysm repair. The pain occurs during physical activity, such as walking, and is located in the front part of the hip. The pain tends to subside when walking downhill. He mentions that the pain has improved over time but still occurs with certain activities.  He also reports shoulder pain, for which he takes naproxen  occasionally, about once a day or every other day, depending on activity level. He also uses Tylenol  frequently for pain management. He takes omeprazole  daily to protect his stomach.  He maintains a healthy diet and stays physically active, walking every other day for about 15 minutes out and back, covering over a mile. He used to walk two miles before his medical issues. No chest pain or pressure during these activities, but notes hip discomfort towards the end of his walks.       Review of Systems      Objective:   Physical Exam  General-in no acute distress Eyes-no discharge Lungs-respiratory rate normal, CTA CV-no murmurs,RRR Extremities skin warm dry no edema Neuro grossly normal Behavior normal, alert Prostate not examined this is under the care of of urology Good range of motion right hip     Assessment & Plan:  1. Well adult exam (Primary) Adult wellness-complete.wellness physical was conducted today. Importance of diet and exercise were discussed in detail.  Importance of stress reduction and healthy living were discussed.  In addition to this a discussion regarding safety was also covered.  We also reviewed over immunizations and gave recommendations regarding current immunization needed for age.   In addition to this additional areas were also touched on including: Preventative health exams needed:  Colonoscopy last one was 2022  Patient was advised yearly wellness exam   2. Encounter for subsequent annual wellness visit (AWV) in Medicare patient Cognitive doing well Up-to-date on immunizations   3. Right hip pain X-ray ordered await results - DG HIP UNILAT W OR W/O PELVIS 2-3 VIEWS RIGHT  4. Prediabetes A1c decent control continue healthy diet  5. Hypothyroidism, unspecified type Thyroid  function doing well continue current meds  6. Hyperlipidemia, unspecified hyperlipidemia type Cholesterol profile overall doing well continue current meds  7. Prostate cancer [facility]) Under care of urology Patient not a good candidate for Viagra  because he is on Imdur  8. Essential hypertension, benign Blood pressure good control

## 2024-06-16 ENCOUNTER — Ambulatory Visit (HOSPITAL_COMMUNITY)
Admission: RE | Admit: 2024-06-16 | Discharge: 2024-06-16 | Disposition: A | Discharge status: Home / Self Care | Source: Ambulatory Visit | Attending: Family Medicine | Admitting: Family Medicine

## 2024-06-16 DIAGNOSIS — M25551 Pain in right hip: Secondary | ICD-10-CM | POA: Insufficient documentation

## 2024-06-17 NOTE — Progress Notes (Signed)
 Name: Mark [name] DOB: 06/14/1951 MRN: [id number]  History of Present Illness: Mark [name] is a 73 y.o. male who presents today for follow up visit at [facility].  Relevant History includes: 1. Prostate cancer (Grade group 3). - 12/24/2023: Brachytherapy seed implantation, SpaceOAR placement, and flexible cystoscopy by Mark [name]. - PSA decreased from 5.0 on 08/04/2023 before brachytherapy to 1.5 on 04/19/2024. 2. Erectile dysfunction.  At last visit with Mark [name] on 04/19/2024: > Having moderate LUTS s/p brachytherapy despite being on Flomax  2x/day.  > The plan was: - Continue Flomax  (Tamsulosin ) 0.4 mg 2x/day. - Avoid dietary triggers (spicy foods, caffeine, etc.)   Since last visit: > 06/06/2024: Normal renal function (creatinine 0.97, GFR 82).  Today: He reports urinary urgency, frequency, and nocturia x3. Denies dysuria, gross hematuria, hesitancy, straining to void, or sensations of incomplete emptying. Reports solid urinary stream.   He reports bothersome erectile dysfunction which has worsened since brachytherapy. He is interested in trying an oral medication for that.    Medications: Current Outpatient Medications  Medication Sig Dispense Refill   acetaminophen  (TYLENOL ) 650 MG CR tablet Take 1,300 mg by mouth every 8 (eight) hours as needed for pain.     albuterol  (VENTOLIN  HFA) 108 (90 Base) MCG/ACT inhaler TAKE 2 PUFFS BY MOUTH EVERY 6 HOURS AS NEEDED FOR WHEEZE OR SHORTNESS OF BREATH 18 each 6   ALPRAZolam  (XANAX ) 0.25 MG tablet Take 1 tablet (0.25 mg total) by mouth at bedtime as needed. 30 tablet 4   aspirin  81 MG tablet Take 1 tablet (81 mg total) by mouth every morning. 30 tablet    augmented betamethasone dipropionate (DIPROLENE-AF) 0.05 % cream Apply 1 Application topically 2 (two) times daily as needed (irritation).     cetirizine (ZYRTEC) 10 MG tablet Take 5 mg by mouth daily as needed for allergies.     fluticasone (FLONASE) 50  MCG/ACT nasal spray Place 1 spray into both nostrils daily as needed for allergies or rhinitis.     isosorbide  mononitrate (IMDUR ) 30 MG 24 hr tablet Take 1 tablet (30 mg total) by mouth daily. 90 tablet 3   levothyroxine  (SYNTHROID ) 100 MCG tablet TAKE 1 TABLET BY MOUTH EVERY DAY 90 tablet 1   losartan  (COZAAR ) 100 MG tablet Take 1 tablet (100 mg total) by mouth daily. 90 tablet 3   metoprolol  succinate (TOPROL -XL) 25 MG 24 hr tablet Take 0.5 tablets (12.5 mg total) by mouth daily. 45 tablet 1   nitroGLYCERIN  (NITROSTAT ) 0.4 MG SL tablet Place 0.4 mg under the tongue every 5 (five) minutes as needed for chest pain. NEVER USED     NON FORMULARY Pt uses a cpap nightly     omeprazole  (PRILOSEC) 20 MG capsule Take 1 capsule (20 mg total) by mouth daily. 90 capsule 1   psyllium (METAMUCIL) 58.6 % powder Take 1 packet by mouth daily.  Pt takes every evening.     rosuvastatin  (CRESTOR ) 40 MG tablet Take 1 tablet (40 mg total) by mouth daily. 90 tablet 3   Saw Palmetto , Serenoa repens, (SAW PALMETTO  PO) Take 1 capsule by mouth daily.     tamsulosin  (FLOMAX ) 0.4 MG CAPS capsule Take 1 capsule (0.4 mg total) by mouth in the morning and at bedtime. 180 capsule 3   triamcinolone  cream (KENALOG ) 0.1 % Apply 1 application topically 2 (two) times daily. 45 g 4   No current facility-administered medications for this visit.    Allergies: Allergies  Allergen Reactions  Mobic [Meloxicam] Rash    Past Medical History:  Diagnosis Date   Abnormal nuclear stress test false positive 05/2018   BPH (benign prostatic hyperplasia) 04/21/2019   Cancer (HCC)    Chest pain stable CAD with cath 06/07/18 possible GI 06/03/2018   Coronary artery disease    a. s/p CABG 2005. b. cath 05/2018 - patent LIMA-LAD, moderate LM and prox OM disease with neg FFR.   Dyslipidemia    Elevated PSA    Esophageal stricture    ESOPHAGEAL STRICTURE 12/18/2008   Qualifier: History of  By: Marcelo CMA (AAMA), Dottie      ESOPHAGITIS 12/18/2008   Qualifier: History of  By: Marcelo CMA (AAMA), Dottie     Essential hypertension, benign 04/06/2013   Gastrointestinal bleeding    GERD (gastroesophageal reflux disease)    History of colonic polyps 08/26/2014   [facility]  during colonoscopy in May of 2015. They recommend repeat colonoscopy May of 2017 do to inadequate prep, pathology showed hyperplastic polyp    Hyperlipidemia    Hypothyroidism    Insomnia 04/21/2019   Obstructive sleep apnea 07/11/2013   Has mixed apnea, CPAP setting 7    OSA (obstructive sleep apnea)    RECTAL BLEEDING 12/18/2008   Qualifier: Diagnosis of  By: Marcelo CMA (AAMA), Dottie     Urinary retention    Vitiligo 07/11/2013   Past Surgical History:  Procedure Laterality Date   ABDOMINAL AORTOGRAM W/LOWER EXTREMITY N/A 11/19/2023   Procedure: ABDOMINAL AORTOGRAM W/LOWER EXTREMITY;  Surgeon: [name] Lonni PARAS, MD;  Location: [facility] INVASIVE CV LAB;  Service: Cardiovascular;  Laterality: N/A;   ANTERIOR CERVICAL DECOMP/DISCECTOMY FUSION N/A 10/29/2022   Procedure: CERVICAL FIVE-SIX ANTERIOR CERVICAL DECOMPRESSION/DISCECTOMY FUSION;  Surgeon: Onetha Kuba, MD;  Location: [facility] OR;  Service: Neurosurgery;  Laterality: N/A;  3C   APPENDECTOMY     bilateral inguinal hernia repair     CARDIAC CATHETERIZATION  12/2005, 06/2005   CHOLECYSTECTOMY N/A 08/04/2022   Procedure: LAPAROSCOPIC CHOLECYSTECTOMY;  Surgeon: Evonnie Dorothyann LABOR, DO;  Location: [facility] ORS;  Service: General;  Laterality: N/A;   COLONOSCOPY     CORONARY ARTERY BYPASS GRAFT  11/17/2004   LIMA to LAD, off pump   CORONARY PRESSURE/FFR STUDY N/A 06/07/2018   Procedure: INTRAVASCULAR PRESSURE WIRE/FFR STUDY;  Surgeon: Anner Alm ORN, MD;  Location: [facility] INVASIVE CV LAB;  Service: Cardiovascular;  Laterality: N/A;   EMBOLIZATION (CATH LAB)  11/19/2023   Procedure: EMBOLIZATION;  Surgeon: [name] Lonni PARAS, MD;  Location: [facility] IN[facility]ASIVE CV  LAB;  Service: Cardiovascular;;   LEFT HEART CATH AND CORS/GRAFTS ANGIOGRAPHY N/A 06/07/2018   Procedure: LEFT HEART CATH AND CORS/GRAFTS ANGIOGRAPHY;  Surgeon: Anner Alm ORN, MD;  Location: [facility] INVASIVE CV LAB;  Service: Cardiovascular;  Laterality: N/A;   OPEN REDUCTION INTERNAL FIXATION (ORIF) HAND Left 07/21/2015   Procedure: IRRIGATION AND DEBRIDMENT LEFT INDEX AND MIDDLE FINGER, OPEN REDUCTION INTERNAL FIXATION LEFT INDEX FINGER.;  Surgeon: Elsie Mussel, MD;  Location: [facility] OR;  Service: Orthopedics;  Laterality: Left;   PERIPHERAL VASCULAR INTERVENTION  11/19/2023   Procedure: PERIPHERAL VASCULAR INTERVENTION;  Surgeon: [name] Lonni PARAS, MD;  Location: [facility] CV [facility];  Service: Cardiovascular;;   PROSTATE BIOPSY     RADIOACTIVE SEED IMPLANT N/A 12/24/2023   Procedure: RADIOACTIVE SEED IMPLANT/BRACHYTHERAPY IMPLANT;  Surgeon: [name] Senior, MD;  Location[facility];  Service: Urology;  Laterality: N/A;   SPACE OAR INSTILLATION N/A 12/24/2023   Procedure: SPACE OAR INSTILLATION;  Surgeon:  [name] Senior, MD;  Location: [facility];  Service: Urology;  Laterality: N/A;   Family History  Problem Relation Age of Onset   Heart disease Father    Hypertension Father    Social History   Socioeconomic History   Marital status: Married    Spouse name: Not on file   Number of children: Not on file   Years of education: Not on file   Highest education level: 12th grade  Occupational History   Occupation: Engineer, agricultural: [employer]  Tobacco Use   Smoking status: Former    Current packs/day: 0.00    Average packs/day: 2.0 packs/day for 20.0 years (40.0 ttl pk-yrs)    Types: Cigarettes    Start date: 07/31/1959    Quit date: 07/31/1979    Years since quitting: 44.9   Smokeless tobacco: Former    Types: Chew    Quit date: 07/22/1995  Vaping Use   Vaping status: Never Used  Substance and Sexual Activity   Alcohol use: Yes     Comment: occassional   Drug use: No   Sexual activity: Yes  Other Topics Concern   Not on file  Social History Narrative   Not on file   Social Drivers of Health   Financial Resource Strain: Low Risk  (12/13/2023)   Overall Financial Resource Strain (CARDIA)    Difficulty of Paying Living Expenses: Not very hard  Food Insecurity: Patient Declined (12/13/2023)   Hunger Vital Sign    Worried About Running Out of Food in the Last Year: Patient declined    Ran Out of Food in the Last Year: Patient declined  Transportation Needs: No Transportation Needs (12/13/2023)   PRAPARE - Administrator, Civil Service (Medical): No    Lack of Transportation (Non-Medical): No  Physical Activity: Insufficiently Active (12/13/2023)   Exercise Vital Sign    Days of Exercise per Week: 3 days    Minutes of Exercise per Session: 30 min  Stress: No Stress Concern Present (12/13/2023)   [employer] of Occupational Health - Occupational Stress Questionnaire    Feeling of Stress : Only a little  Social Connections: Unknown (12/13/2023)   Social Connection and Isolation Panel    Frequency of Communication with Friends and Family: Patient declined    Frequency of Social Gatherings with Friends and Family: Three times a week    Attends Religious Services: Patient declined    Active Member of Clubs or Organizations: Patient declined    Attends Banker Meetings: Not on file    Marital Status: Married  Intimate Partner Violence: Not At Risk (11/03/2023)   Humiliation, Afraid, Rape, and Kick questionnaire    Fear of Current or Ex-Partner: No    Emotionally Abused: No    Physically Abused: No    Sexually Abused: No    Review of Systems Constitutional: Patient denies any unintentional weight loss or change in strength lntegumentary: Patient denies any rashes or pruritus Cardiovascular: Patient denies chest pain or syncope Respiratory: Patient denies shortness of  breath Gastrointestinal: Patient denies nausea, vomiting, constipation, or diarrhea  Musculoskeletal: Patient denies muscle cramps or weakness Neurologic: Patient denies convulsions or seizures Allergic/Immunologic: Patient denies recent allergic reaction(s) Hematologic/Lymphatic: Patient denies bleeding tendencies Endocrine: Patient denies heat/cold intolerance  GU: As per HPI.  OBJECTIVE Vitals:   06/20/24 1034  BP: 110/62  Pulse: 71   There is no height or weight on file to calculate BMI.  Physical Examination  Constitutional: No obvious distress; patient is non-toxic appearing  Cardiovascular: No visible lower extremity edema.  Respiratory: The patient does not have audible wheezing/stridor; respirations do not appear labored  Gastrointestinal: Abdomen non-distended Musculoskeletal: Normal ROM of UEs  Skin: No obvious rashes/open sores  Neurologic: CN 2-12 grossly intact Psychiatric: Answered questions appropriately with normal affect  Hematologic/Lymphatic/Immunologic: No obvious bruises or sites of spontaneous bleeding  UA: negative  PVR: 131 ml  ASSESSMENT Malignant neoplasm of prostate (HCC) - Plan: Urinalysis, Routine w reflex microscopic, BLADDER SCAN AMB NON-IMAGING, PSA, total and free  Erectile dysfunction, unspecified erectile dysfunction type  We reviewed PSA trend, which is responding appropriately to brachytherapy. Will plan to recheck that prior to follow up with Mark [name] in 3 months.   We agreed to consult his cardiologist (Dr. Wendel) to see cardiac clearance for trial of Cialis (Tadalafil) 5 mg daily to assist with both his bothersome erectile dysfunction and LUTS. Will notify patient of response.  Advised to continue Flomax  (Tamsulosin ) 0.4 mg 2x/day. We discussed behavioral modifications for nocturia including avoiding caffeine intake within 6 hours before bedtime and minimizing fluid intake within 3 hours before bedtime and overnight.  Patient  verbalized understanding of and agreement with current plan. All questions were answered.  PLAN Advised the following: 1. Continue Flomax  (Tamsulosin ) 0.4 mg 2x/day.  2. Avoiding caffeine intake within 6 hours before bedtime. 3. Minimize fluid intake within 3 hours before bedtime and overnight. 4. Return in about 3 months (around 09/20/2024) for f/u with Mark [name] with PSA prior.   Orders Placed This Encounter  Procedures   Urinalysis, Routine w reflex microscopic   PSA, total and free    Standing Status:   Future    Expected Date:   09/20/2024    Expiration Date:   06/20/2025   BLADDER SCAN AMB NON-IMAGING   Total time spent caring for the patient today was over 30 minutes. This includes time spent on the date of the visit reviewing the patient's chart before the visit, time spent during the visit, and time spent after the visit on documentation. Over 50% of that time was spent in face-to-face time with this patient for direct counseling. E&M based on time and complexity of medical decision making.  It has been explained that the patient is to follow regularly with their PCP in addition to all other providers involved in their care and to follow instructions provided by these respective offices. Patient advised to contact urology clinic if any urologic-pertaining questions, concerns, new symptoms or problems arise in the interim period.  There are no Patient Instructions on file for this visit.  Electronically signed by:  Lauraine JAYSON Oz, FNP   06/20/24    11:04 AM

## 2024-06-20 ENCOUNTER — Ambulatory Visit: Admitting: Urology

## 2024-06-20 ENCOUNTER — Encounter: Payer: Self-pay | Admitting: Urology

## 2024-06-20 VITALS — BP 110/62 | HR 71

## 2024-06-20 DIAGNOSIS — C61 Malignant neoplasm of prostate: Secondary | ICD-10-CM

## 2024-06-20 DIAGNOSIS — N529 Male erectile dysfunction, unspecified: Secondary | ICD-10-CM | POA: Diagnosis not present

## 2024-06-20 LAB — URINALYSIS, ROUTINE W REFLEX MICROSCOPIC
Bilirubin, UA: NEGATIVE
Glucose, UA: NEGATIVE
Ketones, UA: NEGATIVE
Leukocytes,UA: NEGATIVE
Nitrite, UA: NEGATIVE
Protein,UA: NEGATIVE
RBC, UA: NEGATIVE
Specific Gravity, UA: 1.01 (ref 1.005–1.030)
Urobilinogen, Ur: 0.2 mg/dL (ref 0.2–1.0)
pH, UA: 6 (ref 5.0–7.5)

## 2024-06-20 LAB — BLADDER SCAN AMB NON-IMAGING: Scan Result: 131

## 2024-06-21 ENCOUNTER — Encounter (HOSPITAL_COMMUNITY): Payer: Medicare PPO

## 2024-06-21 ENCOUNTER — Ambulatory Visit: Payer: Medicare PPO | Admitting: Vascular Surgery

## 2024-06-21 ENCOUNTER — Ambulatory Visit: Admitting: Urology

## 2024-06-21 ENCOUNTER — Ambulatory Visit

## 2024-06-21 ENCOUNTER — Ambulatory Visit: Admitting: Vascular Surgery

## 2024-06-21 DIAGNOSIS — I723 Aneurysm of iliac artery: Secondary | ICD-10-CM | POA: Diagnosis not present

## 2024-06-24 ENCOUNTER — Ambulatory Visit: Payer: Self-pay | Admitting: Family Medicine

## 2024-07-07 ENCOUNTER — Other Ambulatory Visit: Payer: Self-pay

## 2024-07-07 ENCOUNTER — Telehealth: Payer: Self-pay

## 2024-07-07 MED ORDER — METOPROLOL SUCCINATE ER 25 MG PO TB24: 12.5000 mg | ORAL_TABLET | Freq: Every day | ORAL | 1 refills | Status: DC

## 2024-07-07 NOTE — Telephone Encounter (Signed)
 Prescription Request  07/07/2024  LOV: Visit date not found  What is the name of the medication or equipment? levothyroxine  (SYNTHROID ) 100 MCG tablet metoprolol  succinate (TOPROL -XL) 25 MG 24 hr tablet   Have you contacted your pharmacy to request a refill? Yes   Which pharmacy would you like this sent to?  [facility] - [place], [place] - [address] [facility] [place] [address] Phone: [phone] Fax: [phone]    Patient notified that their request is being sent to the clinical staff for review and that they should receive a response within 2 business days.   Please advise at Mobile [phone] (mobile)

## 2024-07-08 ENCOUNTER — Other Ambulatory Visit: Payer: Self-pay

## 2024-07-08 MED ORDER — LEVOTHYROXINE SODIUM 100 MCG PO TABS: 100.0000 ug | ORAL_TABLET | Freq: Every day | ORAL | 1 refills | Status: DC

## 2024-07-08 MED ORDER — METOPROLOL SUCCINATE ER 25 MG PO TB24: 12.5000 mg | ORAL_TABLET | Freq: Every day | ORAL | 1 refills | Status: DC

## 2024-07-19 ENCOUNTER — Ambulatory Visit: Admitting: Vascular Surgery

## 2024-07-19 ENCOUNTER — Encounter: Payer: Self-pay | Admitting: Vascular Surgery

## 2024-07-19 VITALS — BP 143/70 | HR 68 | Temp 97.6°F | Resp 16 | Ht 71.0 in | Wt 202.0 lb

## 2024-07-19 DIAGNOSIS — I723 Aneurysm of iliac artery: Secondary | ICD-10-CM | POA: Diagnosis not present

## 2024-07-19 NOTE — Progress Notes (Signed)
 Patient name: Mark [name] MRN: [id number] DOB: 10/07/1951 Sex: male  REASON FOR CONSULT: 6 month follow-up with surveillance right internal iliac aneurysm  HPI: Mark [name] is a 73 y.o. male, with history of coronary artery disease s/p CABG, hypertension, hyperlipidemia, obstructive sleep apnea, prostate cancer that presents for 6 month follow-up after previous repair of right internal iliac aneurysm.  This was discovered incidentally on a CT scan 2024 for workup of his prostate cancer.  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  Was having some right buttock claudication.  This is improving.  Now walking up to half a mile without any issues.  Remains on aspirin  statin.  No other concerns today.  Feels he is doing well.  Past Medical History:  Diagnosis Date   Abnormal nuclear stress test false positive 05/2018   BPH (benign prostatic hyperplasia) 04/21/2019   Cancer (HCC)    Chest pain stable CAD with cath 06/07/18 possible GI 06/03/2018   Coronary artery disease    a. s/p CABG 2005. b. cath 05/2018 - patent LIMA-LAD, moderate LM and prox OM disease with neg FFR.   Dyslipidemia    Elevated PSA    Esophageal stricture    ESOPHAGEAL STRICTURE 12/18/2008   Qualifier: History of  By: Marcelo CMA (AAMA), Dottie     ESOPHAGITIS 12/18/2008   Qualifier: History of  By: Marcelo CMA (AAMA), Dottie     Essential hypertension, benign 04/06/2013   Gastrointestinal bleeding    GERD (gastroesophageal reflux disease)    History of colonic polyps 08/26/2014   [facility] [place]  during colonoscopy in May of 2015. They recommend repeat colonoscopy May of 2017 do to inadequate prep, pathology showed hyperplastic polyp    Hyperlipidemia    Hypothyroidism    Insomnia 04/21/2019    Obstructive sleep apnea 07/11/2013   Has mixed apnea, CPAP setting 7    OSA (obstructive sleep apnea)    RECTAL BLEEDING 12/18/2008   Qualifier: Diagnosis of  By: Marcelo CMA (AAMA), Dottie     Urinary retention    Vitiligo 07/11/2013    Past Surgical History:  Procedure Laterality Date   ABDOMINAL AORTOGRAM W/LOWER EXTREMITY N/A 11/19/2023   Procedure: ABDOMINAL AORTOGRAM W/LOWER EXTREMITY;  Surgeon: [name] Lonni PARAS, MD;  [facility]: [facility];  Service: Cardiovascular;  Laterality: N/A;   ANTERIOR CERVICAL DECOMP/DISCECTOMY FUSION N/A 10/29/2022   Procedure: CERVICAL FIVE-SIX ANTERIOR CERVICAL DECOMPRESSION/DISCECTOMY FUSION;  Surgeon: Onetha Kuba, MD;  Location: [facility] OR;  Service: Neurosurgery;  Laterality: N/A;  3C   APPENDECTOMY     bilateral inguinal hernia repair     CARDIAC CATHETERIZATION  12/2005, 06/2005   CHOLECYSTECTOMY N/A 08/04/2022   Procedure: LAPAROSCOPIC CHOLECYSTECTOMY;  Surgeon: Evonnie Dorothyann LABOR, DO;  Location: [facility];  Service: General;  Laterality: N/A;   COLONOSCOPY     CORONARY ARTERY BYPASS GRAFT  11/17/2004   LIMA to LAD, off pump   CORONARY PRESSURE/FFR STUDY N/A 06/07/2018   Procedure: INTRAVASCULAR PRESSURE WIRE/FFR STUDY;  Surgeon: Anner Alm ORN, MD;  Location: [facility] INVASIVE CV LAB;  Service: Cardiovascular;  Laterality: N/A;   EMBOLIZATION (CATH LAB)  11/19/2023   Procedure: EMBOLIZATION;  Surgeon: [name] Lonni PARAS, MD;  Location: [facility];  Service: Cardiovascular;;   LEFT HEART CATH AND CORS/GRAFTS ANGIOGRAPHY  N/A 06/07/2018   Procedure: LEFT HEART CATH AND CORS/GRAFTS ANGIOGRAPHY;  Surgeon: Anner Alm ORN, MD;  Location: [facility] INVASIVE CV LAB;  Service: Cardiovascular;  Laterality: N/A;   OPEN REDUCTION INTERNAL FIXATION (ORIF) HAND Left 07/21/2015   Procedure: IRRIGATION AND DEBRIDMENT LEFT INDEX AND MIDDLE FINGER, OPEN REDUCTION INTERNAL FIXATION LEFT INDEX FINGER.;  Surgeon: Elsie Mussel, MD;  Location: [facility] OR;   Service: Orthopedics;  Laterality: Left;   PERIPHERAL VASCULAR INTERVENTION  11/19/2023   Procedure: PERIPHERAL VASCULAR INTERVENTION;  Surgeon: [name] Lonni PARAS, MD;  [facility]: [facility];  Service: Cardiovascular;;   PROSTATE BIOPSY     RADIOACTIVE SEED IMPLANT N/A 12/24/2023   Procedure: RADIOACTIVE SEED IMPLANT/BRACHYTHERAPY IMPLANT;  Surgeon: Matilda Senior, MD;  Location: [facility];  Service: Urology;  Laterality: N/A;   SPACE OAR INSTILLATION N/A 12/24/2023   Procedure: SPACE OAR INSTILLATION;  Surgeon: Matilda Senior, MD;  Location: [facility];  Service: Urology;  Laterality: N/A;    Family History  Problem Relation Age of Onset   Heart disease Father    Hypertension Father     SOCIAL HISTORY: Social History   Socioeconomic History   Marital status: Married    Spouse name: Not on file   Number of children: Not on file   Years of education: Not on file   Highest education level: 12th grade  Occupational History   Occupation: Engineer, agricultural: [employer]  Tobacco Use   Smoking status: Former    Current packs/day: 0.00    Average packs/day: 2.0 packs/day for 20.0 years (40.0 ttl pk-yrs)    Types: Cigarettes    Start date: 07/31/1959    Quit date: 07/31/1979    Years since quitting: 45.0   Smokeless tobacco: Former    Types: Chew    Quit date: 07/22/1995  Vaping Use   Vaping status: Never Used  Substance and Sexual Activity   Alcohol use: Yes    Comment: occassional   Drug use: No   Sexual activity: Yes  Other Topics Concern   Not on file  Social History Narrative   Not on file   Social Drivers of Health   Financial Resource Strain: Low Risk  (12/13/2023)   Overall Financial Resource Strain (CARDIA)    Difficulty of Paying Living Expenses: Not very hard  Food Insecurity: Patient Declined (12/13/2023)   Hunger Vital Sign    Worried About Running Out of Food in the Last Year: Patient declined    Ran Out of  Food in the Last Year: Patient declined  Transportation Needs: No Transportation Needs (12/13/2023)   PRAPARE - Administrator, Civil Service (Medical): No    Lack of Transportation (Non-Medical): No  Physical Activity: Insufficiently Active (12/13/2023)   Exercise Vital Sign    Days of Exercise per Week: 3 days    Minutes of Exercise per Session: 30 min  Stress: No Stress Concern Present (12/13/2023)   [employer] of Occupational Health - Occupational Stress Questionnaire    Feeling of Stress : Only a little  Social Connections: Unknown (12/13/2023)   Social Connection and Isolation Panel    Frequency of Communication with Friends and Family: Patient declined    Frequency of Social Gatherings with Friends and Family: Three times a week    Attends Religious Services: Patient declined    Active Member of Clubs or Organizations: Patient declined    Attends Banker Meetings: Not on file  Marital Status: Married  Catering manager Violence: Not At Risk (11/03/2023)   Humiliation, Afraid, Rape, and Kick questionnaire    Fear of Current or Ex-Partner: No    Emotionally Abused: No    Physically Abused: No    Sexually Abused: No    Allergies  Allergen Reactions   Mobic [Meloxicam] Rash    Current Outpatient Medications  Medication Sig Dispense Refill   acetaminophen  (TYLENOL ) 650 MG CR tablet Take 1,300 mg by mouth every 8 (eight) hours as needed for pain.     albuterol  (VENTOLIN  HFA) 108 (90 Base) MCG/ACT inhaler TAKE 2 PUFFS BY MOUTH EVERY 6 HOURS AS NEEDED FOR WHEEZE OR SHORTNESS OF BREATH 18 each 6   ALPRAZolam  (XANAX ) 0.25 MG tablet Take 1 tablet (0.25 mg total) by mouth at bedtime as needed. 30 tablet 4   aspirin  81 MG tablet Take 1 tablet (81 mg total) by mouth every morning. 30 tablet    augmented betamethasone dipropionate (DIPROLENE-AF) 0.05 % cream Apply 1 Application topically 2 (two) times daily as needed (irritation).     cetirizine (ZYRTEC) 10  MG tablet Take 5 mg by mouth daily as needed for allergies.     fluticasone (FLONASE) 50 MCG/ACT nasal spray Place 1 spray into both nostrils daily as needed for allergies or rhinitis.     isosorbide  mononitrate (IMDUR ) 30 MG 24 hr tablet Take 1 tablet (30 mg total) by mouth daily. 90 tablet 3   levothyroxine  (SYNTHROID ) 100 MCG tablet Take 1 tablet (100 mcg total) by mouth daily. 90 tablet 1   losartan  (COZAAR ) 100 MG tablet Take 1 tablet (100 mg total) by mouth daily. 90 tablet 3   metoprolol  succinate (TOPROL -XL) 25 MG 24 hr tablet Take 0.5 tablets (12.5 mg total) by mouth daily. 45 tablet 1   nitroGLYCERIN  (NITROSTAT ) 0.4 MG SL tablet Place 0.4 mg under the tongue every 5 (five) minutes as needed for chest pain. NEVER USED     NON FORMULARY Pt uses a cpap nightly     omeprazole  (PRILOSEC) 20 MG capsule Take 1 capsule (20 mg total) by mouth daily. 90 capsule 1   psyllium (METAMUCIL) 58.6 % powder Take 1 packet by mouth daily.  Pt takes every evening.     rosuvastatin  (CRESTOR ) 40 MG tablet Take 1 tablet (40 mg total) by mouth daily. 90 tablet 3   Saw Palmetto , Serenoa repens, (SAW PALMETTO  PO) Take 1 capsule by mouth daily.     tamsulosin  (FLOMAX ) 0.4 MG CAPS capsule Take 1 capsule (0.4 mg total) by mouth in the morning and at bedtime. 180 capsule 3   triamcinolone  cream (KENALOG ) 0.1 % Apply 1 application topically 2 (two) times daily. 45 g 4   No current facility-administered medications for this visit.    REVIEW OF SYSTEMS:  [X]  denotes positive finding, [ ]  denotes negative finding Cardiac  Comments:  Chest pain or chest pressure:    Shortness of breath upon exertion:    Short of breath when lying flat:    Irregular heart rhythm:        Vascular    Pain in calf, thigh, or hip brought on by ambulation: x Right buttock - improving  Pain in feet at night that wakes you up from your sleep:     Blood clot in your veins:    Leg swelling:         Pulmonary    Oxygen at home:     Productive cough:  Wheezing:         Neurologic    Sudden weakness in arms or legs:     Sudden numbness in arms or legs:     Sudden onset of difficulty speaking or slurred speech:    Temporary loss of vision in one eye:     Problems with dizziness:         Gastrointestinal    Blood in stool:     Vomited blood:         Genitourinary    Burning when urinating:     Blood in urine:        Psychiatric    Major depression:         Hematologic    Bleeding problems:    Problems with blood clotting too easily:        Skin    Rashes or ulcers:        Constitutional    Fever or chills:      PHYSICAL EXAM: There were no vitals filed for this visit.   GENERAL: The patient is a well-nourished male, in no acute distress. The vital signs are documented above. CARDIAC: There is a regular rate and rhythm.  VASCULAR:  Bilateral femoral pulses palpable Bilateral PT pulses palpable PULMONARY: No respiratory distress ABDOMEN: Soft and non-tender. MUSCULOSKELETAL: There are no major deformities or cyanosis. NEUROLOGIC: No focal weakness or paresthesias are detected. PSYCHIATRIC: The patient has a normal affect.  DATA:   Ultrasound aorta 06/21/2024 with patent right common and external iliac stent and thrombosed internal iliac aneurysm  CTA reviewed from 12/16/2023 with coiling of the right internal iliac artery and stenting across the hypogastric in the iliac artery with no flow into the aneurysm.  Assessment/Plan:  73 y.o. 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  This was discovered incidentally on a CT scan 2024 for workup of his prostate cancer.  On 11/19/2023 he underwent coiling of the right internal iliac artery with angioplasty and stenting of the right common and external iliac artery with 11 x  59 VBX.    Duplex looks good today.  Appropriate exclusion of the aneurysm as noted on prior CT after the operation.  Stents remain widely patent.  Will arrange follow-up in 1 year with duplex imaging for ongoing surveillance.  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  He is on aspirin  statin for risk reduction.  Discussed he call with questions or concerns.   Lonni DOROTHA Gaskins, MD [facility] Office: [phone]

## 2024-07-25 ENCOUNTER — Telehealth: Payer: Self-pay | Admitting: Family Medicine

## 2024-07-25 ENCOUNTER — Other Ambulatory Visit: Payer: Self-pay

## 2024-07-25 NOTE — Telephone Encounter (Signed)
 Nurses-please talk with patient I assume he wants this prescription so that he will have some on hand just in case he needs it?  (Essentially were try to make sure that he is not having a bunch of chest pain that he wants to treat on his own-he does have a history of heart disease-I am okay with prescribing this if he just needs to have some on hand Please inform the patient that typically I recommend sublingual nitroglycerin  1 every 5 minutes as needed for chest pain maximum 3 sublingual nitro's if not having relief of chest pain within 3 nitros he needs to go to the ER) If he is on board with everything stated above and not having any significant change or new onset chest pain I can send this into the pharmacy of his choice Thanks-Dr. Glendia  Please talk with patient and send back

## 2024-07-25 NOTE — Telephone Encounter (Signed)
 Patient is request a prescription for   nitroGLYCERIN  (NITROSTAT ) 0.4 MG SL tablet   Not on current medication list never prescribe by this office. [facility]

## 2024-07-26 ENCOUNTER — Other Ambulatory Visit: Payer: Self-pay | Admitting: Family Medicine

## 2024-07-26 MED ORDER — NITROGLYCERIN 0.4 MG SL SUBL: SUBLINGUAL_TABLET | SUBLINGUAL | 5 refills | Status: AC

## 2024-07-26 NOTE — Telephone Encounter (Signed)
 Spoke with patient regarding nitroglycerin .  Last dose was a few years ago.  Denies chest pain at this time.  His bottle recently spilled and ruined the pills he did have.

## 2024-07-26 NOTE — Telephone Encounter (Signed)
 Nitroglycerin  refill sent to [facility]

## 2024-07-27 ENCOUNTER — Encounter: Payer: Self-pay | Admitting: Family Medicine

## 2024-07-27 ENCOUNTER — Ambulatory Visit: Admitting: Family Medicine

## 2024-07-27 ENCOUNTER — Ambulatory Visit: Payer: Self-pay

## 2024-07-27 VITALS — BP 122/78 | HR 72 | Temp 98.3°F | Ht 71.0 in | Wt 199.2 lb

## 2024-07-27 DIAGNOSIS — B349 Viral infection, unspecified: Secondary | ICD-10-CM

## 2024-07-27 MED ORDER — BENZONATATE 100 MG PO CAPS: 100.0000 mg | ORAL_CAPSULE | Freq: Three times a day (TID) | ORAL | 1 refills | Status: DC | PRN

## 2024-07-27 NOTE — Progress Notes (Signed)
 Patient Office Visit  Assessment & Plan:  Viral syndrome -     Benzonatate ; Take 1 capsule (100 mg total) by mouth 3 (three) times daily as needed.  Dispense: 30 capsule; Refill: 1 -     SARS-CoV-2 RNA, Influenza A/B, and RSV RNA, Qualitative NAAT   Assessment and Plan    Acute upper respiratory viral infection Acute viral infection with sore throat, rhinorrhea, myalgia, and possible fever. Differential includes COVID-19, influenza, and RSV. No wheezing or dyspnea. Previous COVID-19 infections. Non-smoker. - Perform respiratory panel for influenza, RSV, and COVID-19. Results expected by Friday. - Prescribe Tessalon  Perles for cough. - Advise albuterol  inhaler if wheezing occurs. - Instruct to perform rapid COVID-19 test at home and report results via MyChart. - [facility] [address] - Advise to contact office or primary care if symptoms worsen.          Return if symptoms worsen or fail to improve.   Subjective:    Patient ID: Mark [name], male    DOB: Sep 17, 1951  Age: 73 y.o. MRN: [id number]  Chief Complaint  Patient presents with   Cough    X 2 days.    Chills    X 1 day.    Cough   Discussed the use of AI scribe software for clinical note transcription with the patient, who gave verbal consent to proceed.  History of Present Illness        Mark [name] is a 73 year old male who presents with symptoms of an upper respiratory infection.  He has been experiencing symptoms for the past two days, starting with a sore throat, followed by rhinorrhea, myalgias, and chills. He has not measured his temperature due to a broken thermometer but describes feeling alternately hot and cold.  He has a history of COVID-19 infection, having been infected a couple of times in the past, with the last episode treated with Paxlovid. He cannot recall the exact timing of his last COVID-19 infection. His wife tested positive for COVID-19 last year, and he  developed symptoms shortly after. He has not been tested for COVID-19 or received the flu shot for the current season.  He has been using over-the-counter medications including Mucinex, Tylenol , and zinc, but these have not significantly alleviated his symptoms. He took Tylenol  earlier today, several hours ago, and uses it regularly for aches and pains. He experienced some wheezing yesterday but not today. He has an albuterol  inhaler at home, which he used yesterday, though he is unsure if it provided relief. He also reports difficulty sleeping due to coughing, which interfered with his use of a CPAP machine. No current shortness of breath and no one at home is currently sick. He attends church regularly. He does not smoke and is not exposed to smoke. He sometimes experiences allergies, particularly in the spring, but is unsure if they are contributing to his current symptoms. No significant exposure to known cases of flu or COVID-19 recently.SABRA Physical Exam HEENT: Throat normal. CHEST: Lungs clear to auscultation. Results Assessment & Plan Acute upper respiratory viral infection Acute viral infection with sore throat, rhinorrhea, myalgia, and possible fever. Differential includes COVID-19, influenza, and RSV. No wheezing or dyspnea. Previous COVID-19 infections. Non-smoker. - Perform respiratory panel for influenza, RSV, and COVID-19. Results expected by Friday. - Prescribe Tessalon  Perles for cough. - Advise albuterol  inhaler if wheezing occurs. - Instruct to perform rapid COVID-19 test at home and report results via MyChart. - [facility] [address] - Advise to contact office or primary care if symptoms worsen.   The 10-year ASCVD risk score (Arnett DK, et al., 2019) is: 22.2%  Past Medical History:  Diagnosis Date   Abnormal nuclear stress test false positive 05/2018   BPH (benign prostatic hyperplasia) 04/21/2019   Cancer (HCC)    Chest pain stable CAD with cath  06/07/18 possible GI 06/03/2018   Coronary artery disease    a. s/p CABG 2005. b. cath 05/2018 - patent LIMA-LAD, moderate LM and prox OM disease with neg FFR.   Dyslipidemia    Elevated PSA    Esophageal stricture    ESOPHAGEAL STRICTURE 12/18/2008   Qualifier: History of  By: Marcelo CMA (AAMA), Dottie     ESOPHAGITIS 12/18/2008   Qualifier: History of  By: Marcelo CMA (AAMA), Dottie     Essential hypertension, benign 04/06/2013   Gastrointestinal bleeding    GERD (gastroesophageal reflux disease)    History of colonic polyps 08/26/2014   [facility] [place]  during colonoscopy in May of 2015. They recommend repeat colonoscopy May of 2017 do to inadequate prep, pathology showed hyperplastic polyp    Hyperlipidemia    Hypothyroidism    Insomnia 04/21/2019   Obstructive sleep apnea 07/11/2013   Has mixed apnea, CPAP setting 7    OSA (obstructive sleep apnea)    Peripheral vascular disease (HCC)    RECTAL BLEEDING 12/18/2008   Qualifier: Diagnosis of  By: Marcelo CMA (AAMA), Dottie     Urinary retention    Vitiligo 07/11/2013   Past Surgical History:  Procedure Laterality Date   ABDOMINAL AORTOGRAM W/LOWER EXTREMITY N/A 11/19/2023   Procedure: ABDOMINAL AORTOGRAM W/LOWER EXTREMITY;  Surgeon: [name] Lonni PARAS, MD;  [facility];  Service: Cardiovascular;  Laterality: N/A;   ANTERIOR CERVICAL DECOMP/DISCECTOMY FUSION N/A 10/29/2022   Procedure: CERVICAL FIVE-SIX ANTERIOR CERVICAL DECOMPRESSION/DISCECTOMY FUSION;  Surgeon: Onetha Kuba, MD;  Location: [facility] OR;  Service: Neurosurgery;  Laterality: N/A;  3C   APPENDECTOMY     bilateral inguinal hernia repair     CARDIAC CATHETERIZATION  12/2005, 06/2005   CHOLECYSTECTOMY N/A 08/04/2022   Procedure: LAPAROSCOPIC CHOLECYSTECTOMY;  Surgeon: Evonnie Dorothyann LABOR, DO;  Location: [facility];  Service: General;  Laterality: N/A;   COLONOSCOPY     CORONARY ARTERY BYPASS GRAFT  11/17/2004    LIMA to LAD, off pump   CORONARY PRESSURE/FFR STUDY N/A 06/07/2018   Procedure: INTRAVASCULAR PRESSURE WIRE/FFR STUDY;  Surgeon: Anner Alm ORN, MD;  Location: [facility] INVASIVE CV LAB;  Service: Cardiovascular;  Laterality: N/A;   EMBOLIZATION (CATH LAB)  11/19/2023   Procedure: EMBOLIZATION;  Surgeon: [name] Lonni PARAS, MD;  [facility]: [facility];  Service: Cardiovascular;;   LEFT HEART CATH AND CORS/GRAFTS ANGIOGRAPHY N/A 06/07/2018   Procedure: LEFT HEART CATH AND CORS/GRAFTS ANGIOGRAPHY;  Surgeon: Anner Alm ORN, MD;  Location: [facility] INVASIVE CV LAB;  Service: Cardiovascular;  Laterality: N/A;   OPEN REDUCTION INTERNAL FIXATION (ORIF) HAND Left 07/21/2015   Procedure: IRRIGATION AND DEBRIDMENT LEFT INDEX AND MIDDLE FINGER, OPEN REDUCTION INTERNAL FIXATION LEFT INDEX FINGER.;  Surgeon: Elsie Mussel, MD;  Location: [facility] OR;  Service: Orthopedics;  Laterality: Left;   PERIPHERAL VASCULAR INTERVENTION  11/19/2023   Procedure: PERIPHERAL VASCULAR INTERVENTION;  Surgeon: [name] Lonni PARAS, MD;  [facility];  Service: Cardiovascular;;   PROSTATE BIOPSY     RADIOACTIVE SEED IMPLANT N/A 12/24/2023   Procedure: RADIOACTIVE SEED IMPLANT/BRACHYTHERAPY  IMPLANT;  Surgeon: Matilda Senior, MD;  Location: [facility];  Service: Urology;  Laterality: N/A;   SPACE OAR INSTILLATION N/A 12/24/2023   Procedure: SPACE OAR INSTILLATION;  Surgeon: Matilda Senior, MD;  Location: [facility];  Service: Urology;  Laterality: N/A;   Social History   Tobacco Use   Smoking status: Former    Current packs/day: 0.00    Average packs/day: 2.0 packs/day for 20.0 years (40.0 ttl pk-yrs)    Types: Cigarettes    Start date: 07/31/1959    Quit date: 07/31/1979    Years since quitting: 45.0   Smokeless tobacco: Former    Types: Chew    Quit date: 07/22/1995  Vaping Use   Vaping status: Never Used  Substance Use Topics   Alcohol use: Yes    Comment: occassional    Drug use: No   Family History  Problem Relation Age of Onset   Heart disease Father    Hypertension Father    Allergies  Allergen Reactions   Mobic [Meloxicam] Rash    Review of Systems  Respiratory:  Positive for cough.       Objective:    BP 122/78   Pulse 72   Temp 98.3 F (36.8 C)   Ht 5' 11 (1.803 m)   Wt 199 lb 4 oz (90.4 kg)   SpO2 97%   BMI 27.79 kg/m  BP Readings from Last 3 Encounters:  07/27/24 122/78  07/19/24 (!) 143/70  06/20/24 110/62   Wt Readings from Last 3 Encounters:  07/27/24 199 lb 4 oz (90.4 kg)  07/19/24 202 lb (91.6 kg)  06/14/24 202 lb (91.6 kg)    Physical Exam Vitals and nursing note reviewed.  Constitutional:      General: He is not in acute distress.    Appearance: Normal appearance.  HENT:     Head: Normocephalic.     Right Ear: Tympanic membrane, ear canal and external ear normal.     Left Ear: Tympanic membrane, ear canal and external ear normal.     Nose: Congestion present.     Mouth/Throat:     Pharynx: No oropharyngeal exudate or posterior oropharyngeal erythema.  Eyes:     Extraocular Movements: Extraocular movements intact.     Pupils: Pupils are equal, round, and reactive to light.  Cardiovascular:     Rate and Rhythm: Normal rate and regular rhythm.     Heart sounds: Normal heart sounds.  Pulmonary:     Effort: Pulmonary effort is normal.     Breath sounds: Normal breath sounds. No wheezing or rhonchi.  Neurological:     General: No focal deficit present.     Mental Status: He is alert and oriented to person, place, and time.  Psychiatric:        Mood and Affect: Mood normal.        Behavior: Behavior normal.      No results found for any visits on 07/27/24.

## 2024-07-27 NOTE — Telephone Encounter (Signed)
 FYI Only or Action Required?: FYI only for provider.  Patient was last seen in primary care on 06/14/2024 by [name], MD.  Called Nurse Triage reporting Cough, Generalized Body Aches, Weakness, Chills, Wheezing, and Sore Throat.  Symptoms began several days ago.  Interventions attempted: OTC medications: mucinex, cough drops and Rest, hydration, or home remedies.  Symptoms are: gradually worsening.  Triage Disposition: See HCP Within 4 Hours (Or PCP Triage)  Patient/caregiver understands and will follow disposition?: Yes     Copied from CRM [id number]. Topic: Clinical - Red Word Triage >> Jul 27, 2024 10:31 AM Sophia H wrote: Red Word that prompted transfer to Nurse Triage:   Wife Shawnee states patient has had bad cough, body aches/weakness, believes he may have fever but not sure because thermometer went out. Started Monday night   Reason for Disposition  Wheezing is present    Wife confirms thinking some abnormal breath sounds present, pt states not bad when asked if wheezing  Answer Assessment - Initial Assessment Questions 1. ONSET: When did the cough begin?      Monday night 2. SEVERITY: How bad is the cough today?      Coughing fits but don't last long 3. SPUTUM: Describe the color of your sputum (e.g., none, dry cough; clear, white, yellow, green)     Not coughing anything up 4. HEMOPTYSIS: Are you coughing up any blood? If Yes, ask: How much? (e.g., flecks, streaks, tablespoons, etc.)     no 5. DIFFICULTY BREATHING: Are you having difficulty breathing? If Yes, ask: How bad is it? (e.g., mild, moderate, severe)      denies 6. FEVER: Do you have a fever? If Yes, ask: What is your temperature, how was it measured, and when did it start?     Thinking fever but thermometer broke, having chills where shivering but doing better under blanket 7. CARDIAC HISTORY: Do you have any history of heart disease? (e.g., heart attack, congestive heart failure)       significant 8. LUNG HISTORY: Do you have any history of lung disease?  (e.g., pulmonary embolus, asthma, emphysema)     OSA 10. OTHER SYMPTOMS: Do you have any other symptoms? (e.g., runny nose, wheezing, chest pain)       Wife confirms thinking some abnormal breath sounds present, pt states not bad when asked if wheezing Sore throat not as bad now Taking mucinex and cough drops Not coughing anything up Just feeling weak but can stand Denies SOB or chest pain Body aches   Advised exam in next 4 hours, no PCP availability, scheduled within region at [facility], confirmed appt/location info. Advised call back if worsening symptoms.  Protocols used: Cough - Acute Non-Productive-A-AH

## 2024-07-27 NOTE — Telephone Encounter (Signed)
 noted

## 2024-07-30 LAB — SARS-COV-2 RNA, INFLUENZA A/B, AND RSV RNA, QUALITATIVE NAAT
INFLUENZA A RNA: NOT DETECTED
INFLUENZA B RNA: NOT DETECTED
RSV RNA: NOT DETECTED
SARS COV2 RNA: NOT DETECTED

## 2024-08-01 ENCOUNTER — Ambulatory Visit: Payer: Self-pay | Admitting: Family Medicine

## 2024-08-24 DIAGNOSIS — L814 Other melanin hyperpigmentation: Secondary | ICD-10-CM | POA: Diagnosis not present

## 2024-08-24 DIAGNOSIS — L570 Actinic keratosis: Secondary | ICD-10-CM | POA: Diagnosis not present

## 2024-08-24 DIAGNOSIS — B079 Viral wart, unspecified: Secondary | ICD-10-CM | POA: Diagnosis not present

## 2024-08-24 DIAGNOSIS — L821 Other seborrheic keratosis: Secondary | ICD-10-CM | POA: Diagnosis not present

## 2024-08-24 DIAGNOSIS — D225 Melanocytic nevi of trunk: Secondary | ICD-10-CM | POA: Diagnosis not present

## 2024-08-24 DIAGNOSIS — D492 Neoplasm of unspecified behavior of bone, soft tissue, and skin: Secondary | ICD-10-CM | POA: Diagnosis not present

## 2024-09-12 ENCOUNTER — Encounter: Payer: Self-pay | Admitting: Internal Medicine

## 2024-09-12 NOTE — Telephone Encounter (Signed)
 Called and spoke to pt regarding the CP. He states he had been experiencing chest pains since receiving a Cortisone shot last Thursday. The only other symptom was Lightheadedness. He ended up taking ONE SL NTG which relieved the chest pain. His BP today was 120/75.   Scheduled him with Hao Meng, PA on 09/15/2024 (was also due for 6 mo f/u per Dr. Wendel). ER precautions reviewed with pt, who verbalized understanding.

## 2024-09-14 ENCOUNTER — Other Ambulatory Visit

## 2024-09-14 ENCOUNTER — Other Ambulatory Visit: Payer: Self-pay

## 2024-09-14 DIAGNOSIS — C61 Malignant neoplasm of prostate: Secondary | ICD-10-CM

## 2024-09-14 NOTE — Progress Notes (Unsigned)
 Cardiology Office Note:    Date:  09/15/2024   ID:  Mark [name], DOB 1951-06-21, MRN [id number]  PCP:  [name], MD   [facility] Providers Cardiologist:  Lurena MARLA Red, MD {  Referring MD: [name], MD   Chief Complaint  Patient presents with   Chest Pain   Follow-up    CAD, HTN, HLD   History of Present Illness:    Mark [name] is a 73 y.o. male with a hx of coronary artery disease s/p CABG x 1 with LIMA-LAD 2006 LHC in 2019 showed patent graft, PAD s/p coil embolization of R internal iliac artery, stent to right CIA and EIA 11/2023, hypertension, hyperlipidemia, CKD stage 2, OSA on CPAP who is being seen in clinic today for 6 month follow up.  He is a patient of Dr. Red, who last saw him 02/2024 for follow up. At this appointment he was doing fairly well. He noted some occasional morning pain that he described as indigestion that was resolving with Pepto-Bismol or spontaneously, he reports this as a chronic issue that has been occurring since his gallbladder was removed. He denied any exertional symptoms at this visit. His medication regimen at this time included: ASA 81 mg daily, Imdur  30 mg daily, Toprol  12.5 mg daily, Crestor  40 mg daily, losartan  100 mg daily.   Today, he reports recent episodes of chest pain that occurred after receiving a cortisone injection last Thursday. He reported associated lightheadedness. He took a single SL NTG, which relieved his chest pain. He reports no recurrence of chest pain since. He typically is able to walk 1-1.5 miles per day and do yard work but has been unable to lately due to weather. He reports feeling slightly lightheadedness this morning when he was burning leaves in the yard. Due to his past medical history of CAD and recent symptoms we discussed potential ischemic evaluations, patient would like to proceed with cardiac PET stress test to evaluate for possible ischemia.   Past Medical History:   Diagnosis Date   Abnormal nuclear stress test false positive 05/2018   BPH (benign prostatic hyperplasia) 04/21/2019   Cancer (HCC)    Chest pain stable CAD with cath 06/07/18 possible GI 06/03/2018   Coronary artery disease    a. s/p CABG 2005. b. cath 05/2018 - patent LIMA-LAD, moderate LM and prox OM disease with neg FFR.   Dyslipidemia    Elevated PSA    Esophageal stricture    ESOPHAGEAL STRICTURE 12/18/2008   Qualifier: History of  By: Marcelo CMA (AAMA), Dottie     ESOPHAGITIS 12/18/2008   Qualifier: History of  By: Marcelo CMA (AAMA), Dottie     Essential hypertension, benign 04/06/2013   Gastrointestinal bleeding    GERD (gastroesophageal reflux disease)    History of colonic polyps 08/26/2014   [facility] [place]  during colonoscopy in May of 2015. They recommend repeat colonoscopy May of 2017 do to inadequate prep, pathology showed hyperplastic polyp    Hyperlipidemia    Hypothyroidism    Insomnia 04/21/2019   Obstructive sleep apnea 07/11/2013   Has mixed apnea, CPAP setting 7    OSA (obstructive sleep apnea)    Peripheral vascular disease    RECTAL BLEEDING 12/18/2008   Qualifier: Diagnosis of  By: Marcelo CMA (AAMA), Dottie     Urinary retention    Vitiligo 07/11/2013   Past Surgical History:  Procedure Laterality Date   ABDOMINAL AORTOGRAM W/LOWER  EXTREMITY N/A 11/19/2023   Procedure: ABDOMINAL AORTOGRAM W/LOWER EXTREMITY;  Surgeon: [name] Lonni PARAS, MD;  Location: [facility] INVASIVE CV LAB;  Service: Cardiovascular;  Laterality: N/A;   ANTERIOR CERVICAL DECOMP/DISCECTOMY FUSION N/A 10/29/2022   Procedure: CERVICAL FIVE-SIX ANTERIOR CERVICAL DECOMPRESSION/DISCECTOMY FUSION;  Surgeon: Onetha Kuba, MD;  Location: [facility] OR;  Service: Neurosurgery;  Laterality: N/A;  3C   APPENDECTOMY     bilateral inguinal hernia repair     CARDIAC CATHETERIZATION  12/2005, 06/2005   CHOLECYSTECTOMY N/A 08/04/2022   Procedure: LAPAROSCOPIC  CHOLECYSTECTOMY;  Surgeon: Evonnie Dorothyann LABOR, DO;  Location: [facility] ORS;  Service: General;  Laterality: N/A;   COLONOSCOPY     CORONARY ARTERY BYPASS GRAFT  11/17/2004   LIMA to LAD, off pump   CORONARY PRESSURE/FFR STUDY N/A 06/07/2018   Procedure: INTRAVASCULAR PRESSURE WIRE/FFR STUDY;  Surgeon: Anner Alm ORN, MD;  Location: [facility] INVASIVE CV LAB;  Service: Cardiovascular;  Laterality: N/A;   EMBOLIZATION (CATH LAB)  11/19/2023   Procedure: EMBOLIZATION;  Surgeon: [name] Lonni PARAS, MD;  Location: [facility] INVASIVE CV LAB;  Service: Cardiovascular;;   LEFT HEART CATH AND CORS/GRAFTS ANGIOGRAPHY N/A 06/07/2018   Procedure: LEFT HEART CATH AND CORS/GRAFTS ANGIOGRAPHY;  Surgeon: Anner Alm ORN, MD;  Location: [facility] INVASIVE CV LAB;  Service: Cardiovascular;  Laterality: N/A;   OPEN REDUCTION INTERNAL FIXATION (ORIF) HAND Left 07/21/2015   Procedure: IRRIGATION AND DEBRIDMENT LEFT INDEX AND MIDDLE FINGER, OPEN REDUCTION INTERNAL FIXATION LEFT INDEX FINGER.;  Surgeon: Elsie Mussel, MD;  Location: [facility] OR;  Service: Orthopedics;  Laterality: Left;   PERIPHERAL VASCULAR INTERVENTION  11/19/2023   Procedure: PERIPHERAL VASCULAR INTERVENTION;  Surgeon: [name] Lonni PARAS, MD;  Location: [facility] INVASIVE CV LAB;  Service: Cardiovascular;;   PROSTATE BIOPSY     RADIOACTIVE SEED IMPLANT N/A 12/24/2023   Procedure: RADIOACTIVE SEED IMPLANT/BRACHYTHERAPY IMPLANT;  Surgeon: Matilda Senior, MD;  Location: [facility];  Service: Urology;  Laterality: N/A;   SPACE OAR INSTILLATION N/A 12/24/2023   Procedure: SPACE OAR INSTILLATION;  Surgeon: Matilda Senior, MD;  Location: [facility];  Service: Urology;  Laterality: N/A;   Current Medications: Current Meds  Medication Sig   acetaminophen  (TYLENOL ) 650 MG CR tablet Take 1,300 mg by mouth every 8 (eight) hours as needed for pain.   albuterol  (VENTOLIN  HFA) 108 (90 Base) MCG/ACT inhaler TAKE 2 PUFFS BY MOUTH EVERY 6 HOURS AS NEEDED FOR  WHEEZE OR SHORTNESS OF BREATH   ALPRAZolam  (XANAX ) 0.25 MG tablet Take 1 tablet (0.25 mg total) by mouth at bedtime as needed.   aspirin  81 MG tablet Take 1 tablet (81 mg total) by mouth every morning.   augmented betamethasone dipropionate (DIPROLENE-AF) 0.05 % cream Apply 1 Application topically 2 (two) times daily as needed (irritation).   benzonatate  (TESSALON  PERLES) 100 MG capsule Take 1 capsule (100 mg total) by mouth 3 (three) times daily as needed.   cetirizine (ZYRTEC) 10 MG tablet Take 5 mg by mouth daily as needed for allergies.   fluticasone (FLONASE) 50 MCG/ACT nasal spray Place 1 spray into both nostrils daily as needed for allergies or rhinitis.   isosorbide  mononitrate (IMDUR ) 30 MG 24 hr tablet Take 1 tablet (30 mg total) by mouth daily.   levothyroxine  (SYNTHROID ) 100 MCG tablet Take 1 tablet (100 mcg total) by mouth daily.   losartan  (COZAAR ) 100 MG tablet Take 1 tablet (100 mg total) by mouth daily.   metoprolol  succinate (TOPROL -XL) 25 MG 24 hr tablet Take 0.5 tablets (12.5 mg  total) by mouth daily.   NAPROSYN  500 MG tablet as needed for moderate pain (pain score 4-6).   nitroGLYCERIN  (NITROSTAT ) 0.4 MG SL tablet 1 sublingual every 5 minutes as needed chest pain if still chest pain after 3 tablets activate 911   NON FORMULARY Pt uses a cpap nightly   omeprazole  (PRILOSEC) 20 MG capsule Take 1 capsule (20 mg total) by mouth daily.   psyllium (METAMUCIL) 58.6 % powder Take 1 packet by mouth daily.  Pt takes every evening.   rosuvastatin  (CRESTOR ) 40 MG tablet Take 1 tablet (40 mg total) by mouth daily.   Saw Palmetto , Serenoa repens, (SAW PALMETTO  PO) Take 1 capsule by mouth daily.   tamsulosin  (FLOMAX ) 0.4 MG CAPS capsule Take 1 capsule (0.4 mg total) by mouth in the morning and at bedtime.   triamcinolone  cream (KENALOG ) 0.1 % Apply 1 application topically 2 (two) times daily.    Allergies:   Mobic [meloxicam]   Social History   Socioeconomic History   Marital status:  Married    Spouse name: Not on file   Number of children: Not on file   Years of education: Not on file   Highest education level: 12th grade  Occupational History   Occupation: Engineer, Agricultural: [employer]  Tobacco Use   Smoking status: Former    Current packs/day: 0.00    Average packs/day: 2.0 packs/day for 20.0 years (40.0 ttl pk-yrs)    Types: Cigarettes    Start date: 07/31/1959    Quit date: 07/31/1979    Years since quitting: 45.1   Smokeless tobacco: Former    Types: Chew    Quit date: 07/22/1995  Vaping Use   Vaping status: Never Used  Substance and Sexual Activity   Alcohol use: Yes    Comment: occassional   Drug use: No   Sexual activity: Yes  Other Topics Concern   Not on file  Social History Narrative   Not on file   Social Drivers of Health   Financial Resource Strain: Low Risk  (12/13/2023)   Overall Financial Resource Strain (CARDIA)    Difficulty of Paying Living Expenses: Not very hard  Food Insecurity: Patient Declined (12/13/2023)   Hunger Vital Sign    Worried About Running Out of Food in the Last Year: Patient declined    Ran Out of Food in the Last Year: Patient declined  Transportation Needs: No Transportation Needs (12/13/2023)   PRAPARE - Administrator, Civil Service (Medical): No    Lack of Transportation (Non-Medical): No  Physical Activity: Insufficiently Active (12/13/2023)   Exercise Vital Sign    Days of Exercise per Week: 3 days    Minutes of Exercise per Session: 30 min  Stress: No Stress Concern Present (12/13/2023)   [employer] of Occupational Health - Occupational Stress Questionnaire    Feeling of Stress : Only a little  Social Connections: Unknown (12/13/2023)   Social Connection and Isolation Panel    Frequency of Communication with Friends and Family: Patient declined    Frequency of Social Gatherings with Friends and Family: Three times a week    Attends Religious Services: Patient declined    Active  Member of Clubs or Organizations: Patient declined    Attends Banker Meetings: Not on file    Marital Status: Married    Family History: The patient's family history includes Heart disease in his father; Hypertension in his father.  ROS:   Please see  the history of present illness.     All other systems reviewed and are negative.  EKGs/Labs/Other Studies Reviewed:    The following studies were reviewed today: EKG Interpretation Date/Time:  Thursday September 15 2024 14:35:38 EDT Ventricular Rate:  71 PR Interval:  162 QRS Duration:  78 QT Interval:  382 QTC Calculation: 415 R Axis:   -5  Text Interpretation: Normal sinus rhythm Normal ECG When compared with ECG of 19-Nov-2023 06:42, No significant change was found Confirmed by NATALIA BIRMINGHAM ([id number]) on 09/15/2024 2:41:32 PM    Recent Labs: 12/24/2023: Hemoglobin 13.9 06/06/2024: ALT 13; BUN 15; Creatinine, Ser 0.97; Potassium 4.4; Sodium 138; TSH 2.250  Recent Lipid Panel    Component Value Date/Time   CHOL 134 06/06/2024 0831   TRIG 280 (H) 06/06/2024 0831   HDL 38 (L) 06/06/2024 0831   CHOLHDL 3.5 06/06/2024 0831   CHOLHDL 3.3 10/31/2014 0919   VLDL 40 10/31/2014 0919   LDLCALC 52 06/06/2024 0831   Risk Assessment/Calculations:                Physical Exam:    VS:  BP 108/69   Pulse 71   Ht 5' 11 (1.803 m)   Wt 204 lb (92.5 kg)   SpO2 94%   BMI 28.45 kg/m     Wt Readings from Last 3 Encounters:  09/15/24 204 lb (92.5 kg)  07/27/24 199 lb 4 oz (90.4 kg)  07/19/24 202 lb (91.6 kg)    GEN:  Well nourished, well developed in no acute distress HEENT: Normal NECK: No JVD; No carotid bruits LYMPHATICS: No lymphadenopathy CARDIAC: RRR, no murmurs, rubs, gallops RESPIRATORY:  Clear to auscultation without rales, wheezing or rhonchi  ABDOMEN: Soft, non-tender, non-distended MUSCULOSKELETAL:  No edema; No deformity  SKIN: Warm and dry NEUROLOGIC:  Alert and oriented x 3 PSYCHIATRIC:  Normal  affect   ASSESSMENT:    1. Angina pectoris   2. Chest pain of uncertain etiology   3. Atherosclerosis of native coronary artery, unspecified whether angina present, unspecified whether native or transplanted heart   4. Essential hypertension, benign   5. Hyperlipidemia LDL goal <55   6. Elevated lipoprotein(a)   7. Peripheral vascular disease, unspecified    PLAN:    CAD s/p CABG in 2006 Stable angina  Chest pain episode Home meds: ASA 81 mg daily, Crestor  40 mg daily, Toprol  12.5 mg daily, Imdur  30 mg daily  Previous history of falsely positive NM stress test LHC 05/2018: stable occlusive CAD, patent LIMA-LAD Reports episode of chest pain while driving after receiving cortisone shot to shoulder last Thursday  Reports some fatigue since then  Typically able to walk 1-1.5 miles and do extensive yard work, has not been doing as much lately with the rain  He denies any additional episodes of chest pain EKG showed NSR without ischemic changes today  Ordering cardiac PET stress to rule out ischemia, plan to scheduled at [facility] as patient lives in [place], [place] Reviewed ER precautions regarding any recurrence of chest pain   Hyperlipidemia, goal LDL < 55 Elevated LP(a) Home meds: Crestor  40 mg daily 06/06/2024: ALT 13; HDL 38; LDL Chol Calc (NIH) 52  Continue on medications as above   Hypertension Home meds: losartan  100 mg daily, Toprol  12.5 mg daily, Imdur  30 mg daily  BP today 108/69 Reports that numbers are typically 120-130s SBP at home Reported an episode of lightheadedness the other day with BP 90/50 Recommended to continue with medications for now  and check BP daily for 2-3 weeks and report back if numbers are staying low and we can adjust medications at that time   Peripheral vascular disease Home meds: ASA 81 mg daily, Crestor  40 mg daily  Followed closely by Dr. [name], last seen 07/2024 Reports no issues with claudication Reports walking 1-1.5 miles most days without issues   Continue home medications   Follow up: 6 months with Dr. Wendel or sooner pending results of PET stress      Informed Consent   Shared Decision Making/Informed Consent{ The risks [chest pain, shortness of breath, cardiac arrhythmias, dizziness, blood pressure fluctuations, myocardial infarction, stroke/transient ischemic attack, nausea, vomiting, allergic reaction, radiation exposure, metallic taste sensation and life-threatening complications (estimated to be 1 in 10,000)], benefits (risk stratification, diagnosing coronary artery disease, treatment guidance) and alternatives of a cardiac PET stress test were discussed in detail with Mr. Battie and he agrees to proceed.     Medication Adjustments/Labs and Tests Ordered: Current medicines are reviewed at length with the patient today.  Concerns regarding medicines are outlined above.  Orders Placed This Encounter  Procedures   NM PET CT CARDIAC PERFUSION MULTI W/ABSOLUTE BLOODFLOW   Cardiac Stress Test: Informed Consent Details: Physician/Practitioner Attestation; Transcribe to consent form and obtain patient signature   EKG 12-Lead   No orders of the defined types were placed in this encounter.   Patient Instructions  Medication Instructions:  Your physician recommends that you continue on your current medications as directed. Please refer to the Current Medication list given to you today.  *If you need a refill on your cardiac medications before your next appointment, please call your pharmacy*  Lab Work: NONE If you have labs (blood work) drawn today and your tests are completely normal, you will receive your results only by: MyChart Message (if you have MyChart) OR A paper copy in the mail If you have any lab test that is abnormal or we need to change your treatment, we will call you to review the results.  Testing/Procedures: You have been recommended to get  A cardiac PET scan is a medical imaging procedure that uses  radioactive tracers to create detailed images of the heart and its blood flow. It helps healthcare professionals diagnose and monitor various heart conditions.   Follow-Up: At [facility], you and your health needs are our priority.  As part of our continuing mission to provide you with exceptional heart care, our providers are all part of one team.  This team includes your primary Cardiologist (physician) and Advanced Practice Providers or APPs (Physician Assistants and Nurse Practitioners) who all work together to provide you with the care you need, when you need it.  Your next appointment:   6 month(s)  Provider:   Lurena MARLA Wendel, MD             Signed, Waddell DELENA Donath, PA-C  09/15/2024 3:36 PM    [facility]

## 2024-09-15 ENCOUNTER — Encounter: Payer: Self-pay | Admitting: Cardiology

## 2024-09-15 ENCOUNTER — Ambulatory Visit: Attending: Physician Assistant | Admitting: Cardiology

## 2024-09-15 VITALS — BP 108/69 | HR 71 | Ht 71.0 in | Wt 204.0 lb

## 2024-09-15 DIAGNOSIS — I739 Peripheral vascular disease, unspecified: Secondary | ICD-10-CM | POA: Diagnosis not present

## 2024-09-15 DIAGNOSIS — R079 Chest pain, unspecified: Secondary | ICD-10-CM | POA: Diagnosis not present

## 2024-09-15 DIAGNOSIS — I10 Essential (primary) hypertension: Secondary | ICD-10-CM | POA: Diagnosis not present

## 2024-09-15 DIAGNOSIS — E785 Hyperlipidemia, unspecified: Secondary | ICD-10-CM | POA: Diagnosis not present

## 2024-09-15 DIAGNOSIS — I2510 Atherosclerotic heart disease of native coronary artery without angina pectoris: Secondary | ICD-10-CM

## 2024-09-15 DIAGNOSIS — E7841 Elevated Lipoprotein(a): Secondary | ICD-10-CM | POA: Diagnosis not present

## 2024-09-15 DIAGNOSIS — I209 Angina pectoris, unspecified: Secondary | ICD-10-CM

## 2024-09-15 LAB — PSA, TOTAL AND FREE
PSA, Free Pct: 10 %
PSA, Free: 0.12 ng/mL
Prostate Specific Ag, Serum: 1.2 ng/mL (ref 0.0–4.0)

## 2024-09-15 NOTE — Patient Instructions (Addendum)
 Medication Instructions:  Your physician recommends that you continue on your current medications as directed. Please refer to the Current Medication list given to you today.  *If you need a refill on your cardiac medications before your next appointment, please call your pharmacy*  Lab Work: NONE If you have labs (blood work) drawn today and your tests are completely normal, you will receive your results only by: MyChart Message (if you have MyChart) OR A paper copy in the mail If you have any lab test that is abnormal or we need to change your treatment, we will call you to review the results.  Testing/Procedures: You have been recommended to get  A cardiac PET scan is a medical imaging procedure that uses radioactive tracers to create detailed images of the heart and its blood flow. It helps healthcare professionals diagnose and monitor various heart conditions.   Follow-Up: At [facility], you and your health needs are our priority.  As part of our continuing mission to provide you with exceptional heart care, our providers are all part of one team.  This team includes your primary Cardiologist (physician) and Advanced Practice Providers or APPs (Physician Assistants and Nurse Practitioners) who all work together to provide you with the care you need, when you need it.  Your next appointment:   6 month(s)  Provider:   Arun K Thukkani, MD

## 2024-09-16 ENCOUNTER — Ambulatory Visit: Payer: Self-pay | Admitting: Urology

## 2024-09-20 ENCOUNTER — Ambulatory Visit: Admitting: Urology

## 2024-09-20 VITALS — BP 119/74 | HR 67

## 2024-09-20 DIAGNOSIS — N529 Male erectile dysfunction, unspecified: Secondary | ICD-10-CM

## 2024-09-20 DIAGNOSIS — N5201 Erectile dysfunction due to arterial insufficiency: Secondary | ICD-10-CM

## 2024-09-20 DIAGNOSIS — C61 Malignant neoplasm of prostate: Secondary | ICD-10-CM | POA: Diagnosis not present

## 2024-09-20 LAB — URINALYSIS, ROUTINE W REFLEX MICROSCOPIC
Bilirubin, UA: NEGATIVE
Glucose, UA: NEGATIVE
Ketones, UA: NEGATIVE
Leukocytes,UA: NEGATIVE
Nitrite, UA: NEGATIVE
Protein,UA: NEGATIVE
RBC, UA: NEGATIVE
Specific Gravity, UA: <1.005 — ABNORMAL LOW (ref 1.005–1.030)
Urobilinogen, Ur: 0.2 mg/dL (ref 0.2–1.0)
pH, UA: 6.5 (ref 5.0–7.5)

## 2024-09-20 MED ORDER — SILDENAFIL CITRATE 100 MG PO TABS: ORAL_TABLET | ORAL | 99 refills | Status: AC

## 2024-09-20 NOTE — Progress Notes (Signed)
 History of Present Illness: This gentleman comes in today for discussion of prostate cancer, recently diagnosed.  Transrectal ultrasound and biopsy was performed on 11.12.2024.  Prostate volume was 25 mL, PSA 5, PSAD 0.20.  5/12 cores revealed adenocarcinoma- -4 cores (right base lateral, right base medial, right mid lateral, right mid medial) revealed GS 4+3 pattern -1 core 4 (left apex medial) revealed GS 3+4 pattern  11.14.2024 CT abdomen and pelvis- 1. Enhancing nodular focus along the right posterior aspect of the prostate gland measuring 11 mm, compatible with known prostate cancer. 2. No convincing evidence of metastatic disease in the abdomen or pelvis. 3. 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 Suggest vascular surgery consultation. 4. Subcentimeter hypodensity in the right lobe of the liver measuring 6 mm, nonspecific but favored to reflect a benign lesion such as a cyst or hemangioma. 5. Nonobstructive bilateral renal stones measure up to 7 mm. 6.  Aortic Atherosclerosis  2.6.2025: Underwent I 125 brachytherapy, Space OAR placement.  6.3.2025: Here today for routine check.  He is having moderate lower urinary tract symptoms despite being on tamsulosin  twice a day.  Minimal dizziness.  IPSS 22.  He has had no blood in his urine or stone.  11.4.2025: He is doing fine from a urinary standpoint.  No blood in his urine, no significant dysuria.  IPSS 10/1.  Still on tamsulosin .  Has minimal semen output.  He would like some help with ED.  He has only taken nitroglycerin  once in the past several years, most recently when he was on prednisone .  He gets regular exercise without chest pain.   Past Medical History:  Diagnosis Date   Abnormal nuclear stress test false positive 05/2018   BPH (benign prostatic hyperplasia) 04/21/2019   Cancer (HCC)    Chest pain stable CAD with cath 06/07/18 possible GI 06/03/2018    Coronary artery disease    a. s/p CABG 2005. b. cath 05/2018 - patent LIMA-LAD, moderate LM and prox OM disease with neg FFR.   Dyslipidemia    Elevated PSA    Esophageal stricture    ESOPHAGEAL STRICTURE 12/18/2008   Qualifier: History of  By: Marcelo CMA (AAMA), Dottie     ESOPHAGITIS 12/18/2008   Qualifier: History of  By: Marcelo CMA (AAMA), Dottie     Essential hypertension, benign 04/06/2013   Gastrointestinal bleeding    GERD (gastroesophageal reflux disease)    History of colonic polyps 08/26/2014   [facility] [place]  during colonoscopy in May of 2015. They recommend repeat colonoscopy May of 2017 do to inadequate prep, pathology showed hyperplastic polyp    Hyperlipidemia    Hypothyroidism    Insomnia 04/21/2019   Obstructive sleep apnea 07/11/2013   Has mixed apnea, CPAP setting 7    OSA (obstructive sleep apnea)    Peripheral vascular disease    RECTAL BLEEDING 12/18/2008   Qualifier: Diagnosis of  By: Marcelo CMA (AAMA), Dottie     Urinary retention    Vitiligo 07/11/2013    Past Surgical History:  Procedure Laterality Date   ABDOMINAL AORTOGRAM W/LOWER EXTREMITY N/A 11/19/2023   Procedure: ABDOMINAL AORTOGRAM W/LOWER EXTREMITY;  Surgeon: [name] Lonni PARAS, MD;  Location: [facility];  Service: Cardiovascular;  Laterality: N/A;   ANTERIOR CERVICAL DECOMP/DISCECTOMY FUSION N/A 10/29/2022   Procedure: CERVICAL FIVE-SIX ANTERIOR CERVICAL DECOMPRESSION/DISCECTOMY FUSION;  Surgeon: Onetha Kuba, MD;  Location: [facility] OR;  Service: Neurosurgery;  Laterality: N/A;  3C  APPENDECTOMY     bilateral inguinal hernia repair     CARDIAC CATHETERIZATION  12/2005, 06/2005   CHOLECYSTECTOMY N/A 08/04/2022   Procedure: LAPAROSCOPIC CHOLECYSTECTOMY;  Surgeon: Evonnie Dorothyann LABOR, DO;  Location: [facility] ORS;  Service: General;  Laterality: N/A;   COLONOSCOPY     CORONARY ARTERY BYPASS GRAFT  11/17/2004   LIMA to LAD, off pump   CORONARY  PRESSURE/FFR STUDY N/A 06/07/2018   Procedure: INTRAVASCULAR PRESSURE WIRE/FFR STUDY;  Surgeon: Anner Alm ORN, MD;  Location: [facility] INVASIVE CV LAB;  Service: Cardiovascular;  Laterality: N/A;   EMBOLIZATION (CATH LAB)  11/19/2023   Procedure: EMBOLIZATION;  Surgeon: [name] Lonni PARAS, MD;  Location: [facility];  Service: Cardiovascular;;   LEFT HEART CATH AND CORS/GRAFTS ANGIOGRAPHY N/A 06/07/2018   Procedure: LEFT HEART CATH AND CORS/GRAFTS ANGIOGRAPHY;  Surgeon: Anner Alm ORN, MD;  Location[facility] INVASIVE CV LAB;  Service: Cardiovascular;  Laterality: N/A;   OPEN REDUCTION INTERNAL FIXATION (ORIF) HAND Left 07/21/2015   Procedure: IRRIGATION AND DEBRIDMENT LEFT INDEX AND MIDDLE FINGER, OPEN REDUCTION INTERNAL FIXATION LEFT INDEX FINGER.;  Surgeon: Elsie Mussel, MD;  Location: [facility] OR;  Service: Orthopedics;  Laterality: Left;   PERIPHERAL VASCULAR INTERVENTION  11/19/2023   Procedure: PERIPHERAL VASCULAR INTERVENTION;  Surgeon: [name] Lonni PARAS, MD;  Location: [facility];  Service: Cardiovascular;;   PROSTATE BIOPSY     RADIOACTIVE SEED IMPLANT N/A 12/24/2023   Procedure: RADIOACTIVE SEED IMPLANT/BRACHYTHERAPY IMPLANT;  Surgeon: Matilda Senior, MD;  Location: [facility];  Service: Urology;  Laterality: N/A;   SPACE OAR INSTILLATION N/A 12/24/2023   Procedure: SPACE OAR INSTILLATION;  Surgeon: Matilda Senior, MD;  Location: [facility];  Service: Urology;  Laterality: N/A;    Home Medications:  Allergies as of 09/20/2024       Reactions   Mobic [meloxicam] Rash        Medication List        Accurate as of September 20, 2024 11:41 AM. If you have any questions, ask your nurse or doctor.          acetaminophen  650 MG CR tablet Commonly known as: TYLENOL  Take 1,300 mg by mouth every 8 (eight) hours as needed for pain.   albuterol  108 (90 Base) MCG/ACT inhaler Commonly known as: VENTOLIN  HFA TAKE 2 PUFFS BY MOUTH EVERY 6  HOURS AS NEEDED FOR WHEEZE OR SHORTNESS OF BREATH   ALPRAZolam  0.25 MG tablet Commonly known as: XANAX  Take 1 tablet (0.25 mg total) by mouth at bedtime as needed.   aspirin  81 MG tablet Take 1 tablet (81 mg total) by mouth every morning.   augmented betamethasone dipropionate 0.05 % cream Commonly known as: DIPROLENE-AF Apply 1 Application topically 2 (two) times daily as needed (irritation).   benzonatate  100 MG capsule Commonly known as: Tessalon  Perles Take 1 capsule (100 mg total) by mouth 3 (three) times daily as needed.   cetirizine 10 MG tablet Commonly known as: ZYRTEC Take 5 mg by mouth daily as needed for allergies.   fluticasone 50 MCG/ACT nasal spray Commonly known as: FLONASE Place 1 spray into both nostrils daily as needed for allergies or rhinitis.   isosorbide  mononitrate 30 MG 24 hr tablet Commonly known as: IMDUR  Take 1 tablet (30 mg total) by mouth daily.   levothyroxine  100 MCG tablet Commonly known as: SYNTHROID  Take 1 tablet (100 mcg total) by mouth daily.   losartan  100 MG tablet Commonly known as: COZAAR  Take 1 tablet (100  mg total) by mouth daily.   metoprolol  succinate 25 MG 24 hr tablet Commonly known as: TOPROL -XL Take 0.5 tablets (12.5 mg total) by mouth daily.   Naprosyn  500 MG tablet Generic drug: naproxen  as needed for moderate pain (pain score 4-6).   nitroGLYCERIN  0.4 MG SL tablet Commonly known as: NITROSTAT  1 sublingual every 5 minutes as needed chest pain if still chest pain after 3 tablets activate 911   NON FORMULARY Pt uses a cpap nightly   omeprazole  20 MG capsule Commonly known as: PRILOSEC Take 1 capsule (20 mg total) by mouth daily.   psyllium 58.6 % powder Commonly known as: METAMUCIL Take 1 packet by mouth daily.  Pt takes every evening.   rosuvastatin  40 MG tablet Commonly known as: CRESTOR  Take 1 tablet (40 mg total) by mouth daily.   SAW PALMETTO  PO Take 1 capsule by mouth daily.   tamsulosin  0.4 MG  Caps capsule Commonly known as: FLOMAX  Take 1 capsule (0.4 mg total) by mouth in the morning and at bedtime.   triamcinolone  cream 0.1 % Commonly known as: KENALOG  Apply 1 application topically 2 (two) times daily.        Allergies:  Allergies  Allergen Reactions   Mobic [Meloxicam] Rash    Family History  Problem Relation Age of Onset   Heart disease Father    Hypertension Father     Social History:  reports that he quit smoking about 45 years ago. His smoking use included cigarettes. He started smoking about 65 years ago. He has a 40 pack-year smoking history. He quit smokeless tobacco use about 29 years ago.  His smokeless tobacco use included chew. He reports current alcohol use. He reports that he does not use drugs.  ROS: A complete review of systems was performed.  All systems are negative except for pertinent findings as noted.  Physical Exam:  Vital signs in last 24 hours: BP 119/74   Pulse 67  Constitutional:  Alert and oriented, No acute distress Cardiovascular: Regular rate  Respiratory: Normal respiratory effort Neurologic: Grossly intact, no focal deficits Psychiatric: Normal mood and affect  I have reviewed prior pt notes  I have reviewed urinalysis results  I have independently reviewed prior imaging--prostate ultrasound for volume  I have reviewed prior PSA and pathology results     Impression/Assessment:  -Grade group 3 prostate cancer, status post 5 brachytherapy/SpaceOAR on 12/24/2023.  Excellent PSA response thus far  - Lower urinary tract symptoms, proved with him being on tamsulosin   - ED  Plan: -He will continue his tamsulosin  twice a day.  -I will see back in 4 months following PSA  - Prescribed him sildenafil .  He knows not to use concurrently with nitroglycerin 

## 2024-09-27 ENCOUNTER — Encounter (HOSPITAL_COMMUNITY): Payer: Self-pay

## 2024-09-27 ENCOUNTER — Other Ambulatory Visit: Payer: Self-pay | Admitting: Family Medicine

## 2024-09-29 ENCOUNTER — Ambulatory Visit
Admission: RE | Admit: 2024-09-29 | Discharge: 2024-09-29 | Disposition: A | Discharge status: Home / Self Care | Source: Ambulatory Visit | Attending: Cardiology | Admitting: Cardiology

## 2024-09-29 DIAGNOSIS — I2510 Atherosclerotic heart disease of native coronary artery without angina pectoris: Secondary | ICD-10-CM | POA: Insufficient documentation

## 2024-09-29 DIAGNOSIS — R079 Chest pain, unspecified: Secondary | ICD-10-CM | POA: Insufficient documentation

## 2024-09-29 LAB — NM PET CT CARDIAC PERFUSION MULTI W/ABSOLUTE BLOODFLOW
LV dias vol: 110 mL (ref 62–150)
LV sys vol: 41 mL (ref 4.2–5.8)
Nuc Rest EF: 53 %
Nuc Stress EF: 63 %
Peak HR: 77 {beats}/min
Rest HR: 48 {beats}/min
Rest Nuclear Isotope Dose: 24 mCi
SRS: 0
SSS: 9
ST Depression (mm): 0 mm
Stress Nuclear Isotope Dose: 23.9 mCi
TID: 1.05

## 2024-09-29 MED ORDER — RUBIDIUM RB82 GENERATOR (RUBYFILL)
25.0000 | PACK | Freq: Once | INTRAVENOUS | Status: AC
  Administered 2024-09-29: 23.94 via INTRAVENOUS

## 2024-09-29 MED ORDER — REGADENOSON 0.4 MG/5ML IV SOLN
0.4000 mg | Freq: Once | INTRAVENOUS | Status: AC
  Administered 2024-09-29: 0.4 mg via INTRAVENOUS
  Filled 2024-09-29: qty 5

## 2024-09-29 MED ORDER — REGADENOSON 0.4 MG/5ML IV SOLN
INTRAVENOUS | Status: AC
  Filled 2024-09-29: qty 5

## 2024-09-29 MED ORDER — RUBIDIUM RB82 GENERATOR (RUBYFILL)
25.0000 | PACK | Freq: Once | INTRAVENOUS | Status: AC
  Administered 2024-09-29: 23.96 via INTRAVENOUS

## 2024-09-30 ENCOUNTER — Ambulatory Visit: Payer: Self-pay | Admitting: Cardiology

## 2024-09-30 ENCOUNTER — Telehealth: Payer: Self-pay | Admitting: Cardiology

## 2024-09-30 NOTE — Telephone Encounter (Signed)
   Called and spoke with patient regarding results of his cardiac PET stress scan that was performed the other day. Discussed that it resulted as an intermediate risk with evidence of ischemia on the scan. When asked about recent symptoms patient denies any recent anginal symptoms. He tells me that he has been fairly active lately and has not been experiencing any symptoms. We decided that he will come back into clinic to discuss results and next steps with consideration of possible LHC. He would prefer to wait to see Dr. Wendel in clinic to be able to discuss his results as he is not having any symptoms. I made him an appointment with Dr. Wendel on 10/27/2024. We went over ER precautions along with instructions to call our office if he wishes to be placed on a different schedule to be seen sooner. Patient expresses understanding and is wanting to wait to see Dr. Wendel. All questions were answered.  Waddell DELENA Donath, PA-C 09/30/2024 6:27 PM

## 2024-10-04 ENCOUNTER — Encounter: Payer: Self-pay | Admitting: Cardiology

## 2024-10-18 NOTE — H&P (View-Only) (Signed)
 "  Cardiology Office Note:   Date:  10/27/2024  ID:  Mark [name], DOB August 29, 1951, MRN [id number] PCP:  [name], MD  [facility] Providers Cardiologist:  [name], MD Referring MD: [name], MD  Chief Complaint/Reason for Referral: Follow-up for coronary artery disease ASSESSMENT:    1. Coronary artery disease of bypass graft of native heart with stable angina pectoris   2. Diastolic dysfunction   3. Hyperlipidemia LDL goal <55   4. Elevated lipoprotein(a)   5. Aortic atherosclerosis   6. Peripheral vascular disease, unspecified   7. CKD (chronic kidney disease) stage 2, GFR 60-89 ml/min      PLAN:   In order of problems listed above: CAD with stable angina: PET stress test with intermediate to high risk findings.  This is interesting because this would suggest that there is an issue with his LIMA to LAD which is a very durable revascularization conduit.  He had a concerning episode of chest pain while driving.  It has not occurred again.  Given the high risk stress findings suggesting a problem with his LIMA graft, we have agreed to proceed to coronary angiography and bypass angiography.  The patient would like this done the week after Christmas.  He is going to [employer] [place]  for a week.  I told him if he develops any severe angina he needs to pursue medical therapy wherever he is.  He understands and agrees with this.  He is already on multiple antianginals including Toprol , Imdur , and as needed nitroglycerin .  Continue aspirin  81 mg and rosuvastatin  40 mg.   Diastolic dysfunction: Continue Toprol  12.5 mg, losartan  100 mg; consider SGLT2 inhibitor in the future. Hyperlipidemia: Given presence of peripheral vascular disease patient's goal LDL is less than 55.  LDL was 52 in July 2025.  Continue rosuvastatin  40 mg daily Elevated LP(a): Continue rosuvastatin  40 mg, LDL was 52 in July 2025 aortic atherosclerosis: Continue aspirin  81 mg, rosuvastatin  40 mg,  and strict blood pressure control. Hypertension: Continue losartan  100 mg, Toprol  12.5 mg.  Blood pressure is well-controlled today. Peripheral vascular disease: Continue aspirin  81 mg, rosuvastatin  40 mg, and strict blood pressure control.  The patient is followed by Dr. [name]. CKD stage II: Continue losartan  100 mg for renal protection.            Dispo:  Return in about 3 weeks (around 11/17/2024).      Medication Adjustments/Labs and Tests Ordered: Current medicines are reviewed at length with the patient today.  Concerns regarding medicines are outlined above.  The following changes have been made:     Labs/tests ordered: Orders Placed This Encounter  Procedures   Basic metabolic panel with GFR   CBC   EKG 12-Lead    Medication Changes: No orders of the defined types were placed in this encounter.   Current medicines are reviewed at length with the patient today.  The patient does not have concerns regarding medicines.  I spent 38 minutes reviewing all clinical data during and prior to this visit including all relevant imaging studies, laboratories, clinical information from other health systems and prior notes from both Cardiology and other specialties, interviewing the patient, conducting a complete physical examination, and coordinating care in order to formulate a comprehensive and personalized evaluation and treatment plan.   History of Present Illness:      FOCUSED PROBLEM LIST:   Coronary artery disease S/p CABG LIMA to LAD 2006 LHC 06/07/2018: LIMA-LAD patent TTE 06/04/2018: EF  60-65 PET stress 09/29/2024: Apical to basal and mid to basal anterolateral ischemia Diastolic dysfunction Mild LVH, EF 60 to 65% TTE 2019 Peripheral arterial disease S/p coil embolization of R internal iliac artery, stent to right CIA and EIA 11/2023 (Dr. [name]) Hypertension Hyperlipidemia LP(a) 101.9 Aortic atherosclerosis CT abdomen pelvis 2025 CKD stage II OSA  February 2023:   The patient is a 73 y.o. male with the indicated medical history here for follow-up.  When I saw him last there was a question about whether he was having chest pain or GERD.  He was started on medical therapy including Imdur , nitroglycerin  as well as continuing his beta-blocker.   The patient tells me that his chest pain symptoms are much improved.  He is able to walk a mile or 2 every other day without chest pain symptoms.  He has noticed that his sleep has been a little bit more interrupted.  I suggested that he potentially change that when he takes his Imdur .  He did have an episode of chest pain today that took a few minutes to resolve.  He did not take as needed nitroglycerin .  He denies any shortness of breath, presyncope, syncope, palpitations, paroxysmal nocturnal dyspnea, orthopnea.  He has required no emergency room visits or hospitalizations.  Plan:  Continue medical therapy for stable angina.   February 2024: The patient returns for routine follow-up.  In the interim he underwent cervical spine surgery without any cardiovascular issues.  The patient is doing very well.  He denies any exertional angina, exertional dyspnea, presyncope, or syncope.  He still recovering from cervical spine surgery and is off naproxen  because of this.  This is resulted in a lot of different joint aches that naproxen  usually controls.  He is hopeful that he will be able to restart this medication in March when he sees the neurosurgeon.  He is otherwise well without cardiovascular complaints.  Plan: Check LP(a).  April 2025:  Patient consents to use of AI scribe. The patient's LP(a) was elevated.  His atorvastatin  was converted to Crestor  40 mg.  Repeat lipid panel demonstrated an LDL of 56.  He had undergone coil embolization and stenting of his right common and external iliac arteries.  He had a telephone visit with [name] in January for preoperative assessment prior to urologic procedure.  He was doing well at  that point in time.  He experiences occasional morning pain described as 'bad indigestion,' persisting for over six months. This pain sometimes resolves with Pepto-Bismol or spontaneously. He has been on stomach acid medication for over twenty years, initiated after his gallbladder removal.  His blood pressure was noted to be elevated during this visit, although it was previously around 120/70 during his last several checks. He does not regularly monitor his blood pressure at home unless he suspects it might be high. He acknowledges that his blood pressure can rise during holidays due to increased sodium intake.  He is doing everything that he needs to do in a day including chopping wood without any exertional angina or exertional dyspnea.  Plan: Increase losartan  to 100 mg and check BMP in 1 week.  He was seen in October.  Due to lightheadedness when burning leaves he was referred for PET stress test  December 2025:  Patient consents to use of AI scribe. His BMP was stable.  His PET stress test demonstrated moderate to high risk findings.  He is here to discuss further.  He experienced chest pain radiating to  his throat, similar to symptoms prior to his previous bypass surgery. This episode occurred after receiving a steroid injection for shoulder pain and [place] Nitroglycerin  relieved the pain within ten minutes.  A stress test indicated that the front wall of his heart was not receiving enough blood. Since the episode, he has not experienced further chest pain.  His blood pressure was elevated at 190/unknown during the stress test, although it is not usually that high and was 120/unknown at the time of the visit.  He is currently on medications including Imdur , metoprolol , and nitroglycerin  as needed. He has not had to use nitroglycerin  frequently.  No recurrent chest pain since the initial episode        Current Medications: Current Meds  Medication Sig    acetaminophen  (TYLENOL ) 650 MG CR tablet Take 1,300 mg by mouth every 8 (eight) hours as needed for pain.   albuterol  (VENTOLIN  HFA) 108 (90 Base) MCG/ACT inhaler TAKE 2 PUFFS BY MOUTH EVERY 6 HOURS AS NEEDED FOR WHEEZE OR SHORTNESS OF BREATH   ALPRAZolam  (XANAX ) 0.25 MG tablet TAKE 1 TABLET AT BEDTIME AS NEEDED   aspirin  81 MG tablet Take 1 tablet (81 mg total) by mouth every morning.   augmented betamethasone dipropionate (DIPROLENE-AF) 0.05 % cream Apply 1 Application topically 2 (two) times daily as needed (irritation).   benzonatate  (TESSALON  PERLES) 100 MG capsule Take 1 capsule (100 mg total) by mouth 3 (three) times daily as needed.   cetirizine (ZYRTEC) 10 MG tablet Take 5 mg by mouth daily as needed for allergies.   fluticasone (FLONASE) 50 MCG/ACT nasal spray Place 1 spray into both nostrils daily as needed for allergies or rhinitis.   isosorbide  mononitrate (IMDUR ) 30 MG 24 hr tablet Take 1 tablet (30 mg total) by mouth daily.   levothyroxine  (SYNTHROID ) 100 MCG tablet Take 1 tablet (100 mcg total) by mouth daily.   losartan  (COZAAR ) 100 MG tablet Take 1 tablet (100 mg total) by mouth daily.   metoprolol  succinate (TOPROL -XL) 25 MG 24 hr tablet Take 0.5 tablets (12.5 mg total) by mouth daily.   NAPROSYN  500 MG tablet as needed for moderate pain (pain score 4-6).   nitroGLYCERIN  (NITROSTAT ) 0.4 MG SL tablet 1 sublingual every 5 minutes as needed chest pain if still chest pain after 3 tablets activate 911   NON FORMULARY Pt uses a cpap nightly   omeprazole  (PRILOSEC) 20 MG capsule Take 1 capsule (20 mg total) by mouth daily.   psyllium (METAMUCIL) 58.6 % powder Take 1 packet by mouth daily.  Pt takes every evening.   rosuvastatin  (CRESTOR ) 40 MG tablet Take 1 tablet (40 mg total) by mouth daily.   Saw Palmetto , Serenoa repens, (SAW PALMETTO  PO) Take 1 capsule by mouth daily.   sildenafil  (VIAGRA ) 100 MG tablet Take 1/2 to 1 tablet p.o. as needed   tamsulosin  (FLOMAX ) 0.4 MG CAPS capsule  Take 1 capsule (0.4 mg total) by mouth in the morning and at bedtime.   triamcinolone  cream (KENALOG ) 0.1 % Apply 1 application topically 2 (two) times daily.     Review of Systems:   Please see the history of present illness.    All other systems reviewed and are negative.     EKGs/Labs/Other Test Reviewed:   EKG: 2025 sinus rhythm  EKG Interpretation Date/Time:  Thursday October 27 2024 14:44:07 EST Ventricular Rate:  69 PR Interval:  168 QRS Duration:  84 QT Interval:  398 QTC Calculation: 426 R Axis:   -  10  Text Interpretation: Normal sinus rhythm Normal ECG When compared with ECG of 15-Sep-2024 14:35, No significant change was found Confirmed by [name] ([id number]) on 10/27/2024 2:57:25 PM         Risk Assessment/Calculations:          Physical Exam:   VS:  BP 126/78 (BP Location: Left Arm, Patient Position: Sitting, Cuff Size: Normal)   Pulse 66   Ht 5' 11 (1.803 m)   Wt 205 lb (93 kg)   SpO2 96%   BMI 28.59 kg/m        Wt Readings from Last 3 Encounters:  10/27/24 205 lb (93 kg)  09/15/24 204 lb (92.5 kg)  07/27/24 199 lb 4 oz (90.4 kg)      GENERAL:  No apparent distress, AOx3 HEENT:  No carotid bruits, +2 carotid impulses, no scleral icterus CAR: RRR no murmurs, gallops, rubs, or thrills RES:  Clear to auscultation bilaterally ABD:  Soft, nontender, nondistended, positive bowel sounds x 4 VASC:  +2 radial pulses, +2 carotid pulses NEURO:  CN 2-12 grossly intact; motor and sensory grossly intact PSYCH:  No active depression or anxiety EXT:  No edema, ecchymosis, or cyanosis  Signed, Haws MARLA Wendel, MD  10/27/2024 2:58 PM    [facility] [address] [place], [place], [place]  [address] Phone: [phone]; Fax: [phone]   Note:  This document was prepared using Dragon voice recognition software and may include unintentional dictation errors. "

## 2024-10-18 NOTE — Progress Notes (Unsigned)
 Cardiology Office Note:   Date:  10/18/2024  ID:  Mark [name], DOB 19-Feb-1951, MRN [id number] PCP:  [name], MD  [facility] Providers Cardiologist:  [name], MD Referring MD: [name], MD  Chief Complaint/Reason for Referral: Follow-up for coronary artery disease ASSESSMENT:    1. Coronary artery disease of bypass graft of native heart with stable angina pectoris   2. Diastolic dysfunction   3. Hyperlipidemia LDL goal <55   4. Elevated lipoprotein(a)   5. Aortic atherosclerosis   6. Peripheral vascular disease, unspecified   7. CKD (chronic kidney disease) stage 2, GFR 60-89 ml/min      PLAN:   In order of problems listed above: CAD with stable angina: PET stress test with intermediate to high risk findings.  ***Continue aspirin  81 mg, Imdur  30 mg, Toprol -XL 12.5 mg and rosuvastatin  40 mg.  On as needed nitroglycerin . Diastolic dysfunction: Continue Toprol  12.5 mg, losartan  100 mg, start Jardiance 10 mg daily***obtain echocardiogram Hyperlipidemia: Given presence of peripheral vascular disease patient's goal LDL is less than 55.  LDL was 52 in July 2025.  Continue rosuvastatin  40 mg daily Elevated LP(a): Continue rosuvastatin  40 mg, LDL was 52 in July 2025 aortic atherosclerosis: Continue aspirin  81 mg, rosuvastatin  40 mg, and strict blood pressure control. Hypertension: Continue losartan  100 mg, Toprol  12.5 mg.   Peripheral vascular disease: Continue aspirin  81 mg, rosuvastatin  40 mg, and strict blood pressure control.  The patient is followed by Dr. [name]. CKD stage II: Continue losartan  100 mg for renal protection.            Dispo:  No follow-ups on file.      Medication Adjustments/Labs and Tests Ordered: Current medicines are reviewed at length with the patient today.  Concerns regarding medicines are outlined above.  The following changes have been made:     Labs/tests ordered: No orders of the defined types were placed in this  encounter.   Medication Changes: No orders of the defined types were placed in this encounter.   Current medicines are reviewed at length with the patient today.  The patient does not have concerns regarding medicines.  I spent 35 minutes reviewing all clinical data during and prior to this visit including all relevant imaging studies, laboratories, clinical information from other health systems and prior notes from both Cardiology and other specialties, interviewing the patient, conducting a complete physical examination, and coordinating care in order to formulate a comprehensive and personalized evaluation and treatment plan.   History of Present Illness:      FOCUSED PROBLEM LIST:   Coronary artery disease S/p CABG LIMA to LAD 2006 LHC 06/07/2018: LIMA-LAD patent TTE 06/04/2018: EF 60-65 PET stress 09/29/2024: Apical to basal and mid to basal anterolateral ischemia Diastolic dysfunction Mild LVH, EF 60 to 65% TTE 2019 Peripheral arterial disease S/p coil embolization of R internal iliac artery, stent to right CIA and EIA 11/2023 (Dr. [name]) Hypertension Hyperlipidemia LP(a) 101.9 Aortic atherosclerosis CT abdomen pelvis 2025 CKD stage II OSA  February 2023:  The patient is a 73 y.o. male with the indicated medical history here for follow-up.  When I saw him last there was a question about whether he was having chest pain or GERD.  He was started on medical therapy including Imdur , nitroglycerin  as well as continuing his beta-blocker.   The patient tells me that his chest pain symptoms are much improved.  He is able to walk a mile or 2 every other day  without chest pain symptoms.  He has noticed that his sleep has been a little bit more interrupted.  I suggested that he potentially change that when he takes his Imdur .  He did have an episode of chest pain today that took a few minutes to resolve.  He did not take as needed nitroglycerin .  He denies any shortness of breath,  presyncope, syncope, palpitations, paroxysmal nocturnal dyspnea, orthopnea.  He has required no emergency room visits or hospitalizations.  Plan:  Continue medical therapy for stable angina.   February 2024: The patient returns for routine follow-up.  In the interim he underwent cervical spine surgery without any cardiovascular issues.  The patient is doing very well.  He denies any exertional angina, exertional dyspnea, presyncope, or syncope.  He still recovering from cervical spine surgery and is off naproxen  because of this.  This is resulted in a lot of different joint aches that naproxen  usually controls.  He is hopeful that he will be able to restart this medication in March when he sees the neurosurgeon.  He is otherwise well without cardiovascular complaints.  Plan: Check LP(a).  April 2025:  Patient consents to use of AI scribe. The patient's LP(a) was elevated.  His atorvastatin  was converted to Crestor  40 mg.  Repeat lipid panel demonstrated an LDL of 56.  He had undergone coil embolization and stenting of his right common and external iliac arteries.  He had a telephone visit with [name] in January for preoperative assessment prior to urologic procedure.  He was doing well at that point in time.  He experiences occasional morning pain described as 'bad indigestion,' persisting for over six months. This pain sometimes resolves with Pepto-Bismol or spontaneously. He has been on stomach acid medication for over twenty years, initiated after his gallbladder removal.  His blood pressure was noted to be elevated during this visit, although it was previously around 120/70 during his last several checks. He does not regularly monitor his blood pressure at home unless he suspects it might be high. He acknowledges that his blood pressure can rise during holidays due to increased sodium intake.  He is doing everything that he needs to do in a day including chopping wood without any exertional  angina or exertional dyspnea.  Plan: Increase losartan  to 100 mg and check BMP in 1 week.  He was seen in October.  Due to lightheadedness when burning leaves he was referred for PET stress test  December 2025:  Patient consents to use of AI scribe. His BMP was stable.  His PET stress test demonstrated moderate to high risk findings.  He is here to discuss further.        Current Medications: No outpatient medications have been marked as taking for the 10/27/24 encounter (Appointment) with Bazil Dhanani K, MD.     Review of Systems:   Please see the history of present illness.    All other systems reviewed and are negative.     EKGs/Labs/Other Test Reviewed:   EKG: 2025 sinus rhythm  EKG Interpretation Date/Time:    Ventricular Rate:    PR Interval:    QRS Duration:    QT Interval:    QTC Calculation:   R Axis:      Text Interpretation:           Risk Assessment/Calculations:          Physical Exam:   VS:  There were no vitals taken for this visit.   No BP  recorded.  {Refresh Note OR Click here to enter BP  :1}***   Wt Readings from Last 3 Encounters:  09/15/24 204 lb (92.5 kg)  07/27/24 199 lb 4 oz (90.4 kg)  07/19/24 202 lb (91.6 kg)      GENERAL:  No apparent distress, AOx3 HEENT:  No carotid bruits, +2 carotid impulses, no scleral icterus CAR: RRR no murmurs, gallops, rubs, or thrills RES:  Clear to auscultation bilaterally ABD:  Soft, nontender, nondistended, positive bowel sounds x 4 VASC:  +2 radial pulses, +2 carotid pulses NEURO:  CN 2-12 grossly intact; motor and sensory grossly intact PSYCH:  No active depression or anxiety EXT:  No edema, ecchymosis, or cyanosis  Signed, Lari Linson K Harlon Kutner, MD  10/18/2024 12:35 PM    [facility] [address] [place], [place], [place]  [address] Phone: [phone]; Fax: [phone]   Note:  This document was prepared using Dragon voice recognition software and may include unintentional  dictation errors.

## 2024-10-27 ENCOUNTER — Ambulatory Visit: Attending: Internal Medicine | Admitting: Internal Medicine

## 2024-10-27 ENCOUNTER — Encounter: Payer: Self-pay | Admitting: Internal Medicine

## 2024-10-27 VITALS — BP 126/78 | HR 66 | Ht 71.0 in | Wt 205.0 lb

## 2024-10-27 DIAGNOSIS — I25708 Atherosclerosis of coronary artery bypass graft(s), unspecified, with other forms of angina pectoris: Secondary | ICD-10-CM | POA: Diagnosis not present

## 2024-10-27 DIAGNOSIS — I5189 Other ill-defined heart diseases: Secondary | ICD-10-CM | POA: Diagnosis not present

## 2024-10-27 DIAGNOSIS — E785 Hyperlipidemia, unspecified: Secondary | ICD-10-CM

## 2024-10-27 DIAGNOSIS — I700 Atherosclerosis of aorta: Secondary | ICD-10-CM | POA: Diagnosis not present

## 2024-10-27 DIAGNOSIS — E7841 Elevated Lipoprotein(a): Secondary | ICD-10-CM

## 2024-10-27 DIAGNOSIS — I739 Peripheral vascular disease, unspecified: Secondary | ICD-10-CM

## 2024-10-27 DIAGNOSIS — N182 Chronic kidney disease, stage 2 (mild): Secondary | ICD-10-CM | POA: Diagnosis not present

## 2024-10-27 LAB — BASIC METABOLIC PANEL WITH GFR
BUN/Creatinine Ratio: 14 (ref 10–24)
BUN: 15 mg/dL (ref 8–27)
CO2: 22 mmol/L (ref 20–29)
Calcium: 9.6 mg/dL (ref 8.6–10.2)
Chloride: 102 mmol/L (ref 96–106)
Creatinine, Ser: 1.04 mg/dL (ref 0.76–1.27)
Glucose: 102 mg/dL — ABNORMAL HIGH (ref 70–99)
Potassium: 4.3 mmol/L (ref 3.5–5.2)
Sodium: 138 mmol/L (ref 134–144)
eGFR: 75 mL/min/1.73 (ref >59)

## 2024-10-27 LAB — CBC
Hematocrit: 43.3 % (ref 37.5–51.0)
Hemoglobin: 14.5 g/dL (ref 13.0–17.7)
MCH: 31.6 pg (ref 26.6–33.0)
MCHC: 33.5 g/dL (ref 31.5–35.7)
MCV: 94 fL (ref 79–97)
Platelets: 170 x10E3/uL (ref 150–450)
RBC: 4.59 x10E6/uL (ref 4.14–5.80)
RDW: 12.6 % (ref 11.6–15.4)
WBC: 6.6 x10E3/uL (ref 3.4–10.8)

## 2024-10-27 NOTE — Patient Instructions (Signed)
 Medication Instructions:  Your physician recommends that you continue on your current medications as directed. Please refer to the Current Medication list given to you today.  *If you need a refill on your cardiac medications before your next appointment, please call your pharmacy*  Lab Work: BMP and CBC at [facility]   If you have labs (blood work) drawn today and your tests are completely normal, you will receive your results only by: MyChart Message (if you have MyChart) OR A paper copy in the mail If you have any lab test that is abnormal or we need to change your treatment, we will call you to review the results.  Testing/Procedures: Your physician has requested that you have a cardiac catheterization. Cardiac catheterization is used to diagnose and/or treat various heart conditions. Doctors may recommend this procedure for a number of different reasons. The most common reason is to evaluate chest pain. Chest pain can be a symptom of coronary artery disease (CAD), and cardiac catheterization can show whether plaque is narrowing or blocking your hearts arteries. This procedure is also used to evaluate the valves, as well as measure the blood flow and oxygen levels in different parts of your heart. For further information please visit [email or web address] Please follow instruction sheet, as given.   Follow-Up: At [facility], you and your health needs are our priority.  As part of our continuing mission to provide you with exceptional heart care, our providers are all part of one team.  This team includes your primary Cardiologist (physician) and Advanced Practice Providers or APPs (Physician Assistants and Nurse Practitioners) who all work together to provide you with the care you need, when you need it.  Your next appointment:   Week of Jan 5-9, 2026  Provider:   Lurena Red, MD      Other Instructions  [facility] A DEPT OF [place]  HEARTCARE AT [facility] A DEPT OF THE [place] [address] [place] [address] Dept: [phone] Loc: [phone]   You are scheduled for a Cardiac Catheterization on Tuesday, December 30 with Dr. Gordy Bergamo.  1. Please arrive at the [facility] (Main Entrance A) at [facility]: [address] [place], [place] [address] at 5:30 AM (This time is 2 hour(s) before your procedure to ensure your preparation).   Free valet parking service is available. You will check in at ADMITTING. The support person will be asked to wait in the waiting room.  It is OK to have someone drop you off and come back when you are ready to be discharged.    Special note: Every effort is made to have your procedure done on time. Please understand that emergencies sometimes delay scheduled procedures.  2. Diet: Nothing to eat after midnight.   3. Hydration: You need to be well hydrated before your procedure. On December 30, you may drink approved liquids (see below) until 2 hours before the procedure, with 16 oz of water  as your last intake.   List of approved liquids water , clear juice, clear tea, black coffee, fruit juices, non-citric and without pulp, carbonated beverages, Gatorade, Kool -Aid, plain Jello-O and plain ice popsicles.  4. Labs: You will need to have blood drawn Today at [facility]. You do not need to be fasting.  5. Medication instructions in preparation for your procedure:   Contrast Allergy: No    On the morning of your procedure, take your Aspirin  81  mg and any morning medicines NOT listed above.  You may use sips of water .  6. Plan to go home the same day, you will only stay overnight if medically necessary. 7. Bring a current list of your medications and current insurance cards. 8. You MUST have a responsible person to drive you home. 9. Someone MUST be with you the first 24 hours after you arrive home or your discharge will be delayed. 10. Please wear clothes that  are easy to get on and off and wear slip-on shoes.  Thank you for allowing us  to care for you!   -- [facility]

## 2024-10-28 ENCOUNTER — Ambulatory Visit: Payer: Self-pay | Admitting: Internal Medicine

## 2024-11-01 NOTE — Progress Notes (Signed)
 Pharmacy Quality Measure Review  This patient is appearing on a report for being at risk of failing the adherence measure for cholesterol (statin) medications this calendar year.   Medication: rosuvastatin  40 mg Last fill date: 10/04/24 for 90 day supply  Insurance report was not up to date. No action needed at this time.   Jenkins Graces, PharmD PGY1 Pharmacy Resident [phone]

## 2024-11-07 ENCOUNTER — Other Ambulatory Visit: Payer: Self-pay | Admitting: Family Medicine

## 2024-11-15 ENCOUNTER — Other Ambulatory Visit: Payer: Self-pay

## 2024-11-15 ENCOUNTER — Ambulatory Visit (HOSPITAL_COMMUNITY)
Admission: RE | Admit: 2024-11-15 | Discharge: 2024-11-15 | Disposition: A | Discharge status: Home / Self Care | Attending: Cardiology | Admitting: Cardiology

## 2024-11-15 ENCOUNTER — Encounter (HOSPITAL_COMMUNITY)
Admission: RE | Disposition: A | Discharge status: Home / Self Care | Payer: Self-pay | Source: Home / Self Care | Attending: Cardiology

## 2024-11-15 DIAGNOSIS — Z79899 Other long term (current) drug therapy: Secondary | ICD-10-CM | POA: Insufficient documentation

## 2024-11-15 DIAGNOSIS — E7841 Elevated Lipoprotein(a): Secondary | ICD-10-CM | POA: Insufficient documentation

## 2024-11-15 DIAGNOSIS — Z7982 Long term (current) use of aspirin: Secondary | ICD-10-CM | POA: Diagnosis not present

## 2024-11-15 DIAGNOSIS — I700 Atherosclerosis of aorta: Secondary | ICD-10-CM | POA: Insufficient documentation

## 2024-11-15 DIAGNOSIS — I25110 Atherosclerotic heart disease of native coronary artery with unstable angina pectoris: Secondary | ICD-10-CM

## 2024-11-15 DIAGNOSIS — N182 Chronic kidney disease, stage 2 (mild): Secondary | ICD-10-CM | POA: Insufficient documentation

## 2024-11-15 DIAGNOSIS — I1310 Hypertensive heart and chronic kidney disease without heart failure, with stage 1 through stage 4 chronic kidney disease, or unspecified chronic kidney disease: Secondary | ICD-10-CM | POA: Insufficient documentation

## 2024-11-15 DIAGNOSIS — I200 Unstable angina: Secondary | ICD-10-CM | POA: Diagnosis present

## 2024-11-15 DIAGNOSIS — I739 Peripheral vascular disease, unspecified: Secondary | ICD-10-CM | POA: Diagnosis not present

## 2024-11-15 HISTORY — PX: LEFT HEART CATH AND CORS/GRAFTS ANGIOGRAPHY: CATH118250

## 2024-11-15 SURGERY — LEFT HEART CATH AND CORS/GRAFTS ANGIOGRAPHY
Anesthesia: LOCAL

## 2024-11-15 MED ORDER — IOHEXOL 350 MG/ML SOLN
INTRAVENOUS | Status: DC | PRN
  Administered 2024-11-15: 60 mL

## 2024-11-15 MED ORDER — LIDOCAINE HCL (PF) 1 % IJ SOLN
INTRAMUSCULAR | Status: DC | PRN
  Administered 2024-11-15: 2 mL

## 2024-11-15 MED ORDER — LIDOCAINE HCL (PF) 1 % IJ SOLN
INTRAMUSCULAR | Status: AC
  Filled 2024-11-15: qty 30

## 2024-11-15 MED ORDER — SODIUM CHLORIDE 0.9% FLUSH: 3.0000 mL | INTRAVENOUS | Status: DC | PRN

## 2024-11-15 MED ORDER — MIDAZOLAM HCL (PF) 2 MG/2ML IJ SOLN
INTRAMUSCULAR | Status: DC | PRN
  Administered 2024-11-15: 2 mg via INTRAVENOUS

## 2024-11-15 MED ORDER — SODIUM CHLORIDE 0.9% FLUSH: 3.0000 mL | Freq: Two times a day (BID) | INTRAVENOUS | Status: DC

## 2024-11-15 MED ORDER — FENTANYL CITRATE (PF) 100 MCG/2ML IJ SOLN
INTRAMUSCULAR | Status: AC
  Filled 2024-11-15: qty 2

## 2024-11-15 MED ORDER — VERAPAMIL HCL 2.5 MG/ML IV SOLN
INTRAVENOUS | Status: AC
  Filled 2024-11-15: qty 2

## 2024-11-15 MED ORDER — FENTANYL CITRATE (PF) 100 MCG/2ML IJ SOLN
INTRAMUSCULAR | Status: DC | PRN
  Administered 2024-11-15: 25 ug via INTRAVENOUS

## 2024-11-15 MED ORDER — HEPARIN (PORCINE) IN NACL 1000-0.9 UT/500ML-% IV SOLN
INTRAVENOUS | Status: DC | PRN
  Administered 2024-11-15 (×2): 500 mL

## 2024-11-15 MED ORDER — ASPIRIN 81 MG PO CHEW: 81.0000 mg | CHEWABLE_TABLET | ORAL | Status: DC

## 2024-11-15 MED ORDER — FREE WATER
500.0000 mL | Freq: Once | Status: AC
  Administered 2024-11-15: 500 mL via ORAL

## 2024-11-15 MED ORDER — VERAPAMIL HCL 2.5 MG/ML IV SOLN
INTRAVENOUS | Status: DC | PRN
  Administered 2024-11-15: 10 mL via INTRA_ARTERIAL

## 2024-11-15 MED ORDER — HEPARIN SODIUM (PORCINE) 1000 UNIT/ML IJ SOLN
INTRAMUSCULAR | Status: DC | PRN
  Administered 2024-11-15 (×2): 4000 [IU] via INTRAVENOUS

## 2024-11-15 MED ORDER — SODIUM CHLORIDE 0.9 % IV SOLN: 250.0000 mL | INTRAVENOUS | Status: DC | PRN

## 2024-11-15 MED ORDER — HEPARIN SODIUM (PORCINE) 1000 UNIT/ML IJ SOLN
INTRAMUSCULAR | Status: AC
  Filled 2024-11-15: qty 10

## 2024-11-15 MED ORDER — MIDAZOLAM HCL 2 MG/2ML IJ SOLN
INTRAMUSCULAR | Status: AC
  Filled 2024-11-15: qty 2

## 2024-11-15 SURGICAL SUPPLY — 10 items
CATH INFINITI 5FR MULTPACK ANG (CATHETERS) IMPLANT
DEVICE RAD COMP TR BAND LRG (VASCULAR PRODUCTS) IMPLANT
GLIDESHEATH SLEND A-KIT 6F 22G (SHEATH) IMPLANT
GUIDEWIRE INQWIRE 1.5J.035X260 (WIRE) IMPLANT
GUIDEWIRE PRESSURE X 175 (WIRE) IMPLANT
KIT ESSENTIALS PG (KITS) IMPLANT
KIT SINGLE USE MANIFOLD (KITS) IMPLANT
KIT SYRINGE INJ CVI SPIKEX1 (MISCELLANEOUS) IMPLANT
PACK CARDIAC CATHETERIZATION (CUSTOM PROCEDURE TRAY) ×1 IMPLANT
SET ATX-X65L (MISCELLANEOUS) IMPLANT

## 2024-11-15 NOTE — Progress Notes (Signed)
 Discharge instructions reviewed with patient and Daughter Damien at the bedside. Denies questions or concerns. TR Band removed no s/s of complications at the incision site. PT ambulated in the hallway. Voided without difficulty. PT was escorted from the unit via wheel chair to personal vehicle.

## 2024-11-15 NOTE — Discharge Instructions (Signed)

## 2024-11-15 NOTE — Interval H&P Note (Signed)
 History and Physical Interval Note:  11/15/2024 7:40 AM  Mark [name]  has presented today for surgery, with the diagnosis of unstable angina.  The various methods of treatment have been discussed with the patient and family. After consideration of risks, benefits and other options for treatment, the patient has consented to  Procedures: LEFT HEART CATH AND CORS/GRAFTS ANGIOGRAPHY (N/A) for class II angina pectoris and intermediate to high risk stress test as a surgical intervention.  The patient's history has been reviewed, patient examined, no change in status, stable for surgery.  I have reviewed the patient's chart and labs.  Questions were answered to the patient's satisfaction.     Gordy Bergamo

## 2024-11-16 ENCOUNTER — Encounter (HOSPITAL_COMMUNITY): Payer: Self-pay | Admitting: Cardiology

## 2024-11-18 NOTE — Progress Notes (Signed)
 "  Cardiology Office Note:   Date:  11/25/2024  ID:  Mark [name], DOB 1950/11/22, MRN [id number] PCP:  Mark Mark LABOR, MD  [facility] Providers Cardiologist:  Mark Haws, MD Referring MD: Mark Mark LABOR, MD  Chief Complaint/Reason for Referral: Follow-up for coronary artery disease ASSESSMENT:    1. Coronary artery disease of bypass graft of native heart with stable angina pectoris   2. Diastolic dysfunction   3. Hyperlipidemia LDL goal <55   4. Elevated lipoprotein(a)   5. Essential hypertension, benign   6. Peripheral vascular disease, unspecified   7. CKD (chronic kidney disease) stage 2, GFR 60-89 ml/min       PLAN:   In order of problems listed above: CAD with stable angina: PET stress test with intermediate to high risk findings.  Recent coronary and bypass angiography reassuring.  Continue aspirin  81 mg, rosuvastatin  40 mg, Toprol  12.5 mg, as needed nitroglycerin , Imdur  30 mg.  No angina Diastolic dysfunction: Continue Toprol  12.5 mg, losartan  100 mg; start Jardiance  10 mg daily Hyperlipidemia: Given presence of peripheral vascular disease patient's goal LDL is less than 55.  LDL was 52 in July 2025.  Continue rosuvastatin  40 mg daily Elevated LP(a): Continue rosuvastatin  40 mg, LDL was 52 in July 2025 which is at goal.  Check hs-CRP today. Aortic atherosclerosis: Continue aspirin  81 mg, rosuvastatin  40 mg, and strict blood pressure control. Hypertension: Continue losartan  100 mg, Toprol  12.5 mg.  Blood pressure is well-controlled today. Peripheral vascular disease: Continue aspirin  81 mg, rosuvastatin  40 mg, and strict blood pressure control.  The patient is followed by Dr. [name]. CKD stage II: Continue losartan  100 mg and start Jardiance  10 mg daily for renal protection.            Dispo:  Return in about 6 months (around 05/25/2025).      Medication Adjustments/Labs and Tests Ordered: Current medicines are reviewed at length with the patient today.   Concerns regarding medicines are outlined above.  The following changes have been made:     Labs/tests ordered: Orders Placed This Encounter  Procedures   High sensitivity CRP    Medication Changes: Meds ordered this encounter  Medications   empagliflozin  (JARDIANCE ) 10 MG TABS tablet    Sig: Take 1 tablet (10 mg total) by mouth daily before breakfast.    Dispense:  30 tablet    Refill:  3    Current medicines are reviewed at length with the patient today.  The patient does not have concerns regarding medicines.  I spent 35 minutes reviewing all clinical data during and prior to this visit including all relevant imaging studies, laboratories, clinical information from other health systems and prior notes from both Cardiology and other specialties, interviewing the patient, conducting a complete physical examination, and coordinating care in order to formulate a comprehensive and personalized evaluation and treatment plan.   History of Present Illness:      FOCUSED PROBLEM LIST:   Coronary artery disease S/p CABG LIMA to LAD 2006 Cath 06/07/2018: LIMA-LAD patent TTE 06/04/2018: EF 60-65 PET stress 09/29/2024: Apical to basal and mid to basal anterolateral ischemia Cath 11/15/2024 patent LIMA to LAD, LIMA backfills D1 (new lesion correlating with stress findings); RVR OM1 negative Diastolic dysfunction Mild LVH, EF 60 to 65% TTE 2019 Peripheral arterial disease S/p coil embolization of R internal iliac artery, stent to right CIA and EIA 11/2023 (Dr. [name]) Hypertension Hyperlipidemia LP(a) 101.9 Aortic atherosclerosis CT abdomen pelvis 2025 CKD stage II  OSA  February 2023:  The patient is a 74 y.o. male with the indicated medical history here for follow-up.  When I saw him last there was a question about whether he was having chest pain or GERD.  He was started on medical therapy including Imdur , nitroglycerin  as well as continuing his beta-blocker.   The patient tells me  that his chest pain symptoms are much improved.  He is able to walk a mile or 2 every other day without chest pain symptoms.  He has noticed that his sleep has been a little bit more interrupted.  I suggested that he potentially change that when he takes his Imdur .  He did have an episode of chest pain today that took a few minutes to resolve.  He did not take as needed nitroglycerin .  He denies any shortness of breath, presyncope, syncope, palpitations, paroxysmal nocturnal dyspnea, orthopnea.  He has required no emergency room visits or hospitalizations.  Plan:  Continue medical therapy for stable angina.   February 2024: The patient returns for routine follow-up.  In the interim he underwent cervical spine surgery without any cardiovascular issues.  The patient is doing very well.  He denies any exertional angina, exertional dyspnea, presyncope, or syncope.  He still recovering from cervical spine surgery and is off naproxen  because of this.  This is resulted in a lot of different joint aches that naproxen  usually controls.  He is hopeful that he will be able to restart this medication in March when he sees the neurosurgeon.  He is otherwise well without cardiovascular complaints.  Plan: Check LP(a).  April 2025:  Patient consents to use of AI scribe. The patient's LP(a) was elevated.  His atorvastatin  was converted to Crestor  40 mg.  Repeat lipid panel demonstrated an LDL of 56.  He had undergone coil embolization and stenting of his right common and external iliac arteries.  He had a telephone visit with Mark [name] in January for preoperative assessment prior to urologic procedure.  He was doing well at that point in time.  He experiences occasional morning pain described as 'bad indigestion,' persisting for over six months. This pain sometimes resolves with Pepto-Bismol or spontaneously. He has been on stomach acid medication for over twenty years, initiated after his gallbladder removal.  His blood  pressure was noted to be elevated during this visit, although it was previously around 120/70 during his last several checks. He does not regularly monitor his blood pressure at home unless he suspects it might be high. He acknowledges that his blood pressure can rise during holidays due to increased sodium intake.  He is doing everything that he needs to do in a day including chopping wood without any exertional angina or exertional dyspnea.  Plan: Increase losartan  to 100 mg and check BMP in 1 week.  He was seen in October.  Due to lightheadedness when burning leaves he was referred for PET stress test  December 2025:  Patient consents to use of AI scribe. His BMP was stable.  His PET stress test demonstrated moderate to high risk findings.  He is here to discuss further.  He experienced chest pain radiating to his throat, similar to symptoms prior to his previous bypass surgery. This episode occurred after receiving a steroid injection for shoulder pain and [place]. Nitroglycerin  relieved the pain within ten minutes.  A stress test indicated that the front wall of his heart was not receiving enough blood. Since the episode, he  has not experienced further chest pain.  His blood pressure was elevated during the stress test, although it is not usually that high.  He is currently on medications including Imdur , metoprolol , and nitroglycerin  as needed. He has not had to use nitroglycerin  frequently.  No recurrent chest pain since the initial episode.  Plan: Refer for coronary and bypass angiography  January 2026:  Patient consents to use of AI scribe. He underwent bypass and coronary angiography which demonstrated a new high-grade lesion of native D1.  This artery is filled by the LIMA to LAD which is widely patent.  RFR of the obtuse marginal 1 was negative.  Medical therapy was pursued.  He is not experiencing chest pain but has noticed increased shortness of breath since the  test.  He is currently on losartan  for heart and kidney function. He also takes Tylenol  and naloxone occasionally for back and neck pain.  During the review of symptoms, he denies chest pain but notes increased shortness of breath and hip pain. He also mentions having back and neck pain, which he manages with Tylenol  and naloxone.        Current Medications: Current Meds  Medication Sig   acetaminophen  (TYLENOL ) 650 MG CR tablet Take 1,300 mg by mouth every 6 (six) hours as needed for pain.   albuterol  (VENTOLIN  HFA) 108 (90 Base) MCG/ACT inhaler TAKE 2 PUFFS BY MOUTH EVERY 6 HOURS AS NEEDED FOR WHEEZE OR SHORTNESS OF BREATH   ALPRAZolam  (XANAX ) 0.25 MG tablet TAKE 1 TABLET AT BEDTIME AS NEEDED   aspirin  81 MG tablet Take 1 tablet (81 mg total) by mouth every morning.   augmented betamethasone dipropionate (DIPROLENE-AF) 0.05 % cream Apply 1 Application topically 2 (two) times daily as needed (irritation).   empagliflozin  (JARDIANCE ) 10 MG TABS tablet Take 1 tablet (10 mg total) by mouth daily before breakfast.   fluticasone (FLONASE) 50 MCG/ACT nasal spray Place 1 spray into both nostrils daily as needed for allergies or rhinitis.   isosorbide  mononitrate (IMDUR ) 30 MG 24 hr tablet Take 1 tablet (30 mg total) by mouth daily.   ketotifen (ZADITOR) 0.035 % ophthalmic solution Place 1 drop into both eyes daily as needed (itchy eyes).   levothyroxine  (SYNTHROID ) 100 MCG tablet Take 1 tablet (100 mcg total) by mouth daily.   losartan  (COZAAR ) 100 MG tablet Take 1 tablet (100 mg total) by mouth daily.   metoprolol  succinate (TOPROL -XL) 25 MG 24 hr tablet Take 0.5 tablets (12.5 mg total) by mouth daily.   NAPROSYN  500 MG tablet as needed for moderate pain (pain score 4-6).   nitroGLYCERIN  (NITROSTAT ) 0.4 MG SL tablet 1 sublingual every 5 minutes as needed chest pain if still chest pain after 3 tablets activate 911   NON FORMULARY Pt uses a cpap nightly   omeprazole  (PRILOSEC) 20 MG capsule TAKE 1  CAPSULE EVERY DAY   prednisoLONE acetate (PRED FORTE) 1 % ophthalmic suspension Place 1 drop into the right eye 2 (two) times daily.   psyllium (METAMUCIL) 58.6 % powder Take 1 packet by mouth daily.  Pt takes every evening.   rosuvastatin  (CRESTOR ) 40 MG tablet Take 1 tablet (40 mg total) by mouth daily.   Saw Palmetto , Serenoa repens, (SAW PALMETTO  PO) Take 1 capsule by mouth daily.   sildenafil  (VIAGRA ) 100 MG tablet Take 1/2 to 1 tablet p.o. as needed   tamsulosin  (FLOMAX ) 0.4 MG CAPS capsule Take 1 capsule (0.4 mg total) by mouth in the morning and at bedtime.  Review of Systems:   Please see the history of present illness.    All other systems reviewed and are negative.     EKGs/Labs/Other Test Reviewed:   EKG: November 15, 2024 sinus bradycardia  EKG Interpretation Date/Time:    Ventricular Rate:    PR Interval:    QRS Duration:    QT Interval:    QTC Calculation:   R Axis:      Text Interpretation:           Risk Assessment/Calculations:          Physical Exam:   VS:  BP 130/72   Pulse 62   Ht 5' 11 (1.803 m)   Wt 206 lb 12.8 oz (93.8 kg)   SpO2 92%   BMI 28.84 kg/m        Wt Readings from Last 3 Encounters:  11/25/24 206 lb 12.8 oz (93.8 kg)  11/15/24 205 lb (93 kg)  10/27/24 205 lb (93 kg)      GENERAL:  No apparent distress, AOx3 HEENT:  No carotid bruits, +2 carotid impulses, no scleral icterus CAR: RRR no murmurs, gallops, rubs, or thrills RES:  Clear to auscultation bilaterally ABD:  Soft, nontender, nondistended, positive bowel sounds x 4 VASC:  +2 radial pulses, +2 carotid pulses NEURO:  CN 2-12 grossly intact; motor and sensory grossly intact PSYCH:  No active depression or anxiety EXT:  No edema, ecchymosis, or cyanosis  Signed, Maverik Foot K Raena Pau, MD  11/25/2024 2:33 PM    [facility] [address] [place], [place], [place]  [address] Phone: [phone]; Fax: [phone]   Note:  This document was prepared using  Dragon voice recognition software and may include unintentional dictation errors. "

## 2024-11-25 ENCOUNTER — Ambulatory Visit: Attending: Internal Medicine | Admitting: Internal Medicine

## 2024-11-25 ENCOUNTER — Encounter: Payer: Self-pay | Admitting: Internal Medicine

## 2024-11-25 VITALS — BP 130/72 | HR 62 | Ht 71.0 in | Wt 206.8 lb

## 2024-11-25 DIAGNOSIS — N182 Chronic kidney disease, stage 2 (mild): Secondary | ICD-10-CM

## 2024-11-25 DIAGNOSIS — I5189 Other ill-defined heart diseases: Secondary | ICD-10-CM

## 2024-11-25 DIAGNOSIS — I10 Essential (primary) hypertension: Secondary | ICD-10-CM

## 2024-11-25 DIAGNOSIS — I25708 Atherosclerosis of coronary artery bypass graft(s), unspecified, with other forms of angina pectoris: Secondary | ICD-10-CM

## 2024-11-25 DIAGNOSIS — I739 Peripheral vascular disease, unspecified: Secondary | ICD-10-CM | POA: Diagnosis not present

## 2024-11-25 DIAGNOSIS — E785 Hyperlipidemia, unspecified: Secondary | ICD-10-CM | POA: Diagnosis not present

## 2024-11-25 DIAGNOSIS — E7841 Elevated Lipoprotein(a): Secondary | ICD-10-CM | POA: Diagnosis not present

## 2024-11-25 MED ORDER — EMPAGLIFLOZIN 10 MG PO TABS: 10.0000 mg | ORAL_TABLET | Freq: Every day | ORAL | 3 refills | Status: AC

## 2024-11-25 NOTE — Patient Instructions (Signed)
 Medication Instructions:  START Jardiance  10 mg once daily  *If you need a refill on your cardiac medications before your next appointment, please call your pharmacy*  Lab Work: To be completed today: hs-CRP  If you have labs (blood work) drawn today and your tests are completely normal, you will receive your results only by: MyChart Message (if you have MyChart) OR A paper copy in the mail If you have any lab test that is abnormal or we need to change your treatment, we will call you to review the results.  Testing/Procedures: None ordered today.  Follow-Up: At [facility], you and your health needs are our priority.  As part of our continuing mission to provide you with exceptional heart care, our providers are all part of one team.  This team includes your primary Cardiologist (physician) and Advanced Practice Providers or APPs (Physician Assistants and Nurse Practitioners) who all work together to provide you with the care you need, when you need it.  Your next appointment:   6 month(s)  Provider:   Arun Thukkani, MD

## 2024-11-26 ENCOUNTER — Ambulatory Visit: Payer: Self-pay | Admitting: Internal Medicine

## 2024-11-26 LAB — HIGH SENSITIVITY CRP: CRP, High Sensitivity: 0.46 mg/L (ref 0.00–3.00)

## 2024-11-29 ENCOUNTER — Encounter: Payer: Self-pay | Admitting: Family Medicine

## 2024-11-30 ENCOUNTER — Other Ambulatory Visit: Payer: Self-pay | Admitting: Family Medicine

## 2024-11-30 DIAGNOSIS — I10 Essential (primary) hypertension: Secondary | ICD-10-CM

## 2024-11-30 DIAGNOSIS — Z79899 Other long term (current) drug therapy: Secondary | ICD-10-CM

## 2024-11-30 DIAGNOSIS — E785 Hyperlipidemia, unspecified: Secondary | ICD-10-CM

## 2024-12-09 ENCOUNTER — Ambulatory Visit: Payer: Self-pay | Admitting: Family Medicine

## 2024-12-09 LAB — HEPATIC FUNCTION PANEL
ALT: 11 IU/L (ref 0–44)
AST: 17 IU/L (ref 0–40)
Albumin: 4.7 g/dL (ref 3.8–4.8)
Alkaline Phosphatase: 50 IU/L (ref 47–123)
Bilirubin Total: 1.7 mg/dL — ABNORMAL HIGH (ref 0.0–1.2)
Bilirubin, Direct: 0.42 mg/dL — ABNORMAL HIGH (ref 0.00–0.40)
Total Protein: 7.1 g/dL (ref 6.0–8.5)

## 2024-12-09 LAB — LIPID PANEL
Chol/HDL Ratio: 3.2 ratio (ref 0.0–5.0)
Cholesterol, Total: 137 mg/dL (ref 100–199)
HDL: 43 mg/dL
LDL Chol Calc (NIH): 55 mg/dL (ref 0–99)
Triglycerides: 245 mg/dL — ABNORMAL HIGH (ref 0–149)
VLDL Cholesterol Cal: 39 mg/dL (ref 5–40)

## 2024-12-09 LAB — MICROALBUMIN / CREATININE URINE RATIO
Creatinine, Urine: 32.8 mg/dL
Microalb/Creat Ratio: <9 mg/g{creat} (ref 0–29)
Microalbumin, Urine: <3 ug/mL

## 2024-12-13 ENCOUNTER — Encounter: Payer: Self-pay | Admitting: Family Medicine

## 2024-12-13 ENCOUNTER — Ambulatory Visit (INDEPENDENT_AMBULATORY_CARE_PROVIDER_SITE_OTHER): Admitting: Family Medicine

## 2024-12-13 ENCOUNTER — Ambulatory Visit: Admitting: Family Medicine

## 2024-12-13 VITALS — BP 130/76 | HR 66 | Temp 98.2°F | Ht 71.0 in | Wt 206.1 lb

## 2024-12-13 DIAGNOSIS — Z79899 Other long term (current) drug therapy: Secondary | ICD-10-CM | POA: Diagnosis not present

## 2024-12-13 DIAGNOSIS — I10 Essential (primary) hypertension: Secondary | ICD-10-CM | POA: Diagnosis not present

## 2024-12-13 DIAGNOSIS — M5432 Sciatica, left side: Secondary | ICD-10-CM

## 2024-12-13 DIAGNOSIS — C61 Malignant neoplasm of prostate: Secondary | ICD-10-CM

## 2024-12-13 DIAGNOSIS — E785 Hyperlipidemia, unspecified: Secondary | ICD-10-CM

## 2024-12-13 DIAGNOSIS — R7303 Prediabetes: Secondary | ICD-10-CM

## 2024-12-13 NOTE — Progress Notes (Signed)
 "  Subjective:    Patient ID: Mark [name], male    DOB: 03/06/1951, 74 y.o.   MRN: [id number]  HPI Patient is here for a 6 month follow up visit  Patient has underlying heart disease Followed by cardiologist Date having low medications added Jardiance  recently He states he is doing better Had a catheterization which showed blockages but none that were surgical on this go around Patient has history of hyperlipidemia on medication try to keep LDL below 70 and if possible below 55 Patient also has prediabetes for which the Jardiance  they recently started on should help him He was cautioned that if he ever has significant intestinal issues to hold off on taking Jardiance  if not able to keep fluids down  Patient stated he would like to discuss having left hip struggle  States gets pain discomfort in his back radiates into the left hip and partly down the leg intermittently comes and goes no weakness with it but it does limit his walking  Patient also stated that he had a stress test done about 2 months ago and catheterization after the test was completed and he would like to discuss everything with DR.  Results for orders placed or performed in visit on 11/30/24  Hepatic function panel   Collection Time: 12/08/24  8:23 AM  Result Value Ref Range   Total Protein 7.1 6.0 - 8.5 g/dL   Albumin 4.7 3.8 - 4.8 g/dL   Bilirubin Total 1.7 (H) 0.0 - 1.2 mg/dL   Bilirubin, Direct 9.57 (H) 0.00 - 0.40 mg/dL   Alkaline Phosphatase 50 47 - 123 IU/L   AST 17 0 - 40 IU/L   ALT 11 0 - 44 IU/L  Lipid panel   Collection Time: 12/08/24  8:23 AM  Result Value Ref Range   Cholesterol, Total 137 100 - 199 mg/dL   Triglycerides 754 (H) 0 - 149 mg/dL   HDL 43 >60 mg/dL   VLDL Cholesterol Cal 39 5 - 40 mg/dL   LDL Chol Calc (NIH) 55 0 - 99 mg/dL   Chol/HDL Ratio 3.2 0.0 - 5.0 ratio  Microalbumin/Creatinine Ratio, Urine   Collection Time: 12/08/24  8:23 AM  Result Value Ref Range   Creatinine, Urine  32.8 Not Estab. mg/dL   Microalbumin, Urine <6.9 Not Estab. ug/mL   Microalb/Creat Ratio <9 0 - 29 mg/g creat     Review of Systems     Objective:   Physical Exam  General-in no acute distress Eyes-no discharge Lungs-respiratory rate normal, CTA CV-no murmurs,RRR Extremities skin warm dry no edema Neuro grossly normal Behavior normal, alert Negative straight leg raise bilateral no swelling noted      Assessment & Plan:  1. Malignant neoplasm of prostate [facility]) PSA staying under reasonable control will follow through with urology but at some point in time they may turn him back over to us  to monitor  2. Hyperlipidemia, unspecified hyperlipidemia type (Primary) LDL at 55 we will try to keep it there or less continue medication  3. High risk medication use No sign of liver dysfunction  4. Essential hypertension, benign Blood pressure good control continue current measures  5. Prediabetes Recent Jardiance  added by cardiology for his heart will actually up his sugars    6. Left sided sciatica Stretching exercises were shown If he has persistent trouble over the course the next several weeks especially if worsening touch base with us  hold off on any type of x-rays MRI currently-if not doing dramatically  better within 8 to 12 weeks consider medication   Follow-up in 6 months labs before that visit "

## 2024-12-20 ENCOUNTER — Other Ambulatory Visit: Payer: Self-pay | Admitting: Family Medicine

## 2025-01-16 ENCOUNTER — Other Ambulatory Visit

## 2025-01-24 ENCOUNTER — Ambulatory Visit: Admitting: Urology

## 2025-06-21 ENCOUNTER — Encounter: Admitting: Family Medicine
